# Patient Record
Sex: Male | Born: 1935 | Race: White | Hispanic: No | Marital: Married | Smoking: Former smoker
Health system: Southern US, Community
[De-identification: ages and names within clinical notes are randomized; demographics above are authoritative.]

## PROBLEM LIST (undated history)

## (undated) DIAGNOSIS — R21 Rash and other nonspecific skin eruption: Secondary | ICD-10-CM

## (undated) DIAGNOSIS — J189 Pneumonia, unspecified organism: Secondary | ICD-10-CM

## (undated) DIAGNOSIS — R040 Epistaxis: Secondary | ICD-10-CM

## (undated) DIAGNOSIS — C9200 Acute myeloblastic leukemia, not having achieved remission: Secondary | ICD-10-CM

## (undated) DIAGNOSIS — Z8711 Personal history of peptic ulcer disease: Secondary | ICD-10-CM

## (undated) DIAGNOSIS — I219 Acute myocardial infarction, unspecified: Secondary | ICD-10-CM

## (undated) DIAGNOSIS — I10 Essential (primary) hypertension: Secondary | ICD-10-CM

## (undated) DIAGNOSIS — E119 Type 2 diabetes mellitus without complications: Secondary | ICD-10-CM

## (undated) DIAGNOSIS — C4440 Unspecified malignant neoplasm of skin of scalp and neck: Secondary | ICD-10-CM

## (undated) DIAGNOSIS — R1900 Intra-abdominal and pelvic swelling, mass and lump, unspecified site: Secondary | ICD-10-CM

## (undated) DIAGNOSIS — J449 Chronic obstructive pulmonary disease, unspecified: Secondary | ICD-10-CM

## (undated) DIAGNOSIS — E785 Hyperlipidemia, unspecified: Secondary | ICD-10-CM

## (undated) DIAGNOSIS — M109 Gout, unspecified: Secondary | ICD-10-CM

## (undated) DIAGNOSIS — I509 Heart failure, unspecified: Secondary | ICD-10-CM

## (undated) DIAGNOSIS — I209 Angina pectoris, unspecified: Secondary | ICD-10-CM

## (undated) DIAGNOSIS — Z9289 Personal history of other medical treatment: Secondary | ICD-10-CM

## (undated) DIAGNOSIS — K219 Gastro-esophageal reflux disease without esophagitis: Secondary | ICD-10-CM

## (undated) DIAGNOSIS — C649 Malignant neoplasm of unspecified kidney, except renal pelvis: Secondary | ICD-10-CM

## (undated) DIAGNOSIS — I2510 Atherosclerotic heart disease of native coronary artery without angina pectoris: Secondary | ICD-10-CM

## (undated) DIAGNOSIS — N189 Chronic kidney disease, unspecified: Secondary | ICD-10-CM

## (undated) DIAGNOSIS — N2889 Other specified disorders of kidney and ureter: Secondary | ICD-10-CM

## (undated) DIAGNOSIS — R011 Cardiac murmur, unspecified: Secondary | ICD-10-CM

## (undated) DIAGNOSIS — R012 Other cardiac sounds: Secondary | ICD-10-CM

## (undated) DIAGNOSIS — I519 Heart disease, unspecified: Secondary | ICD-10-CM

## (undated) DIAGNOSIS — E039 Hypothyroidism, unspecified: Secondary | ICD-10-CM

## (undated) DIAGNOSIS — D649 Anemia, unspecified: Secondary | ICD-10-CM

## (undated) DIAGNOSIS — U071 COVID-19: Secondary | ICD-10-CM

## (undated) DIAGNOSIS — R413 Other amnesia: Secondary | ICD-10-CM

## (undated) HISTORY — DX: Other cardiac sounds: R01.2

## (undated) HISTORY — DX: Other amnesia: R41.3

## (undated) HISTORY — PX: LYMPH NODE DISSECTION: SHX5087

## (undated) HISTORY — PX: CATARACT EXTRACTION W/ INTRAOCULAR LENS  IMPLANT, BILATERAL: SHX1307

## (undated) HISTORY — DX: Rash and other nonspecific skin eruption: R21

## (undated) HISTORY — DX: Epistaxis: R04.0

## (undated) HISTORY — DX: Hyperlipidemia, unspecified: E78.5

## (undated) HISTORY — PX: BACK SURGERY: SHX140

## (undated) HISTORY — PX: MOHS SURGERY: SUR867

## (undated) HISTORY — DX: Essential (primary) hypertension: I10

## (undated) HISTORY — DX: Atherosclerotic heart disease of native coronary artery without angina pectoris: I25.10

## (undated) HISTORY — PX: OTHER SURGICAL HISTORY: SHX169

## (undated) HISTORY — DX: Cardiac murmur, unspecified: R01.1

## (undated) HISTORY — PX: NASAL SEPTUM SURGERY: SHX37

## (undated) HISTORY — DX: Heart disease, unspecified: I51.9

## (undated) HISTORY — DX: Intra-abdominal and pelvic swelling, mass and lump, unspecified site: R19.00

## (undated) HISTORY — PX: CARDIAC CATHETERIZATION: SHX172

## (undated) HISTORY — PX: BONE MARROW ASPIRATION: SHX1252

## (undated) HISTORY — DX: Hypothyroidism, unspecified: E03.9

## (undated) HISTORY — PX: BONE MARROW BIOPSY: SHX1253

## (undated) HISTORY — PX: LUMBAR DISC SURGERY: SHX700

## (undated) HISTORY — DX: Other specified disorders of kidney and ureter: N28.89

---
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
## 2001-03-14 ENCOUNTER — Ambulatory Visit (HOSPITAL_COMMUNITY): Admission: RE | Admit: 2001-03-14 | Discharge: 2001-03-14 | Payer: Self-pay | Admitting: Family Medicine

## 2001-03-14 ENCOUNTER — Encounter: Payer: Self-pay | Admitting: Family Medicine

## 2001-09-12 ENCOUNTER — Other Ambulatory Visit: Admission: RE | Admit: 2001-09-12 | Discharge: 2001-09-12 | Payer: Self-pay | Admitting: General Surgery

## 2001-09-21 ENCOUNTER — Other Ambulatory Visit: Admission: RE | Admit: 2001-09-21 | Discharge: 2001-09-21 | Payer: Self-pay | Admitting: Dermatology

## 2001-11-02 ENCOUNTER — Encounter: Payer: Self-pay | Admitting: Family Medicine

## 2001-11-02 ENCOUNTER — Ambulatory Visit (HOSPITAL_COMMUNITY): Admission: RE | Admit: 2001-11-02 | Discharge: 2001-11-02 | Payer: Self-pay | Admitting: Family Medicine

## 2001-12-05 ENCOUNTER — Ambulatory Visit (HOSPITAL_COMMUNITY): Admission: RE | Admit: 2001-12-05 | Discharge: 2001-12-05 | Payer: Self-pay | Admitting: Family Medicine

## 2001-12-05 ENCOUNTER — Encounter: Payer: Self-pay | Admitting: Family Medicine

## 2001-12-27 ENCOUNTER — Ambulatory Visit (HOSPITAL_COMMUNITY): Admission: RE | Admit: 2001-12-27 | Discharge: 2001-12-27 | Payer: Self-pay | Admitting: Family Medicine

## 2002-10-04 ENCOUNTER — Inpatient Hospital Stay (HOSPITAL_COMMUNITY): Admission: EM | Admit: 2002-10-04 | Discharge: 2002-10-05 | Payer: Self-pay | Admitting: Emergency Medicine

## 2002-10-04 ENCOUNTER — Encounter: Payer: Self-pay | Admitting: Emergency Medicine

## 2003-02-19 ENCOUNTER — Encounter (HOSPITAL_COMMUNITY): Admission: RE | Admit: 2003-02-19 | Discharge: 2003-03-21 | Payer: Self-pay | Admitting: Oncology

## 2003-02-19 ENCOUNTER — Encounter: Admission: RE | Admit: 2003-02-19 | Discharge: 2003-02-19 | Payer: Self-pay | Admitting: Oncology

## 2003-03-03 ENCOUNTER — Emergency Department (HOSPITAL_COMMUNITY): Admission: EM | Admit: 2003-03-03 | Discharge: 2003-03-03 | Payer: Self-pay | Admitting: Emergency Medicine

## 2003-04-19 ENCOUNTER — Encounter: Admission: RE | Admit: 2003-04-19 | Discharge: 2003-04-19 | Payer: Self-pay | Admitting: Hematology and Oncology

## 2003-04-19 ENCOUNTER — Encounter (HOSPITAL_COMMUNITY): Admission: RE | Admit: 2003-04-19 | Discharge: 2003-05-19 | Payer: Self-pay | Admitting: Hematology and Oncology

## 2003-05-08 ENCOUNTER — Ambulatory Visit (HOSPITAL_COMMUNITY): Admission: RE | Admit: 2003-05-08 | Discharge: 2003-05-08 | Payer: Self-pay | Admitting: Family Medicine

## 2003-05-08 ENCOUNTER — Encounter: Payer: Self-pay | Admitting: Family Medicine

## 2003-05-31 ENCOUNTER — Encounter: Payer: Self-pay | Admitting: Family Medicine

## 2003-05-31 ENCOUNTER — Ambulatory Visit (HOSPITAL_COMMUNITY): Admission: RE | Admit: 2003-05-31 | Discharge: 2003-05-31 | Payer: Self-pay | Admitting: Family Medicine

## 2003-06-04 ENCOUNTER — Encounter (HOSPITAL_COMMUNITY): Admission: RE | Admit: 2003-06-04 | Discharge: 2003-07-04 | Payer: Self-pay | Admitting: Oncology

## 2003-06-04 ENCOUNTER — Encounter: Admission: RE | Admit: 2003-06-04 | Discharge: 2003-06-04 | Payer: Self-pay | Admitting: Oncology

## 2003-07-12 ENCOUNTER — Encounter: Admission: RE | Admit: 2003-07-12 | Discharge: 2003-07-12 | Payer: Self-pay | Admitting: Oncology

## 2003-07-12 ENCOUNTER — Encounter (HOSPITAL_COMMUNITY): Admission: RE | Admit: 2003-07-12 | Discharge: 2003-08-11 | Payer: Self-pay | Admitting: Oncology

## 2003-10-10 ENCOUNTER — Encounter: Admission: RE | Admit: 2003-10-10 | Discharge: 2003-10-10 | Payer: Self-pay | Admitting: Oncology

## 2003-10-10 ENCOUNTER — Encounter (HOSPITAL_COMMUNITY): Admission: RE | Admit: 2003-10-10 | Discharge: 2003-11-09 | Payer: Self-pay | Admitting: Oncology

## 2004-05-07 ENCOUNTER — Encounter: Admission: RE | Admit: 2004-05-07 | Discharge: 2004-05-07 | Payer: Self-pay | Admitting: Oncology

## 2004-05-07 ENCOUNTER — Encounter (HOSPITAL_COMMUNITY): Admission: RE | Admit: 2004-05-07 | Discharge: 2004-06-06 | Payer: Self-pay | Admitting: Oncology

## 2004-08-31 ENCOUNTER — Ambulatory Visit (HOSPITAL_COMMUNITY): Admission: RE | Admit: 2004-08-31 | Discharge: 2004-08-31 | Payer: Self-pay | Admitting: Pediatrics

## 2004-09-28 ENCOUNTER — Ambulatory Visit (HOSPITAL_COMMUNITY): Admission: RE | Admit: 2004-09-28 | Discharge: 2004-09-28 | Payer: Self-pay | Admitting: Family Medicine

## 2005-01-07 ENCOUNTER — Ambulatory Visit: Payer: Self-pay | Admitting: Cardiology

## 2005-01-07 ENCOUNTER — Ambulatory Visit (HOSPITAL_COMMUNITY): Payer: Self-pay | Admitting: Oncology

## 2005-01-07 ENCOUNTER — Encounter: Admission: RE | Admit: 2005-01-07 | Discharge: 2005-01-07 | Payer: Self-pay | Admitting: Oncology

## 2005-01-07 ENCOUNTER — Encounter (HOSPITAL_COMMUNITY): Admission: RE | Admit: 2005-01-07 | Discharge: 2005-02-06 | Payer: Self-pay | Admitting: Oncology

## 2005-01-19 ENCOUNTER — Ambulatory Visit: Payer: Self-pay

## 2005-01-21 ENCOUNTER — Ambulatory Visit: Payer: Self-pay | Admitting: Cardiology

## 2005-04-10 ENCOUNTER — Ambulatory Visit (HOSPITAL_COMMUNITY): Admission: RE | Admit: 2005-04-10 | Discharge: 2005-04-10 | Payer: Self-pay | Admitting: Family Medicine

## 2005-11-16 ENCOUNTER — Ambulatory Visit (HOSPITAL_COMMUNITY): Admission: RE | Admit: 2005-11-16 | Discharge: 2005-11-16 | Payer: Self-pay | Admitting: Family Medicine

## 2005-11-25 ENCOUNTER — Ambulatory Visit (HOSPITAL_COMMUNITY): Admission: RE | Admit: 2005-11-25 | Discharge: 2005-11-25 | Payer: Self-pay | Admitting: Family Medicine

## 2006-01-06 ENCOUNTER — Encounter: Admission: RE | Admit: 2006-01-06 | Discharge: 2006-01-06 | Payer: Self-pay | Admitting: Oncology

## 2006-01-06 ENCOUNTER — Encounter (HOSPITAL_COMMUNITY): Admission: RE | Admit: 2006-01-06 | Discharge: 2006-02-05 | Payer: Self-pay | Admitting: Oncology

## 2006-01-06 ENCOUNTER — Ambulatory Visit (HOSPITAL_COMMUNITY): Payer: Self-pay | Admitting: Oncology

## 2006-02-07 ENCOUNTER — Ambulatory Visit: Payer: Self-pay | Admitting: Cardiology

## 2006-07-08 ENCOUNTER — Encounter (HOSPITAL_COMMUNITY): Admission: RE | Admit: 2006-07-08 | Discharge: 2006-07-09 | Payer: Self-pay | Admitting: Oncology

## 2006-07-08 ENCOUNTER — Ambulatory Visit (HOSPITAL_COMMUNITY): Payer: Self-pay | Admitting: Oncology

## 2006-07-08 ENCOUNTER — Encounter: Admission: RE | Admit: 2006-07-08 | Discharge: 2006-07-09 | Payer: Self-pay | Admitting: Oncology

## 2006-07-12 ENCOUNTER — Encounter: Admission: RE | Admit: 2006-07-12 | Discharge: 2006-07-12 | Payer: Self-pay | Admitting: Oncology

## 2006-10-11 DIAGNOSIS — N2889 Other specified disorders of kidney and ureter: Secondary | ICD-10-CM

## 2006-10-11 DIAGNOSIS — R1900 Intra-abdominal and pelvic swelling, mass and lump, unspecified site: Secondary | ICD-10-CM

## 2006-10-11 HISTORY — DX: Other specified disorders of kidney and ureter: N28.89

## 2006-10-11 HISTORY — DX: Intra-abdominal and pelvic swelling, mass and lump, unspecified site: R19.00

## 2007-03-03 ENCOUNTER — Ambulatory Visit (HOSPITAL_COMMUNITY): Admission: RE | Admit: 2007-03-03 | Discharge: 2007-03-03 | Payer: Self-pay | Admitting: Family Medicine

## 2007-03-09 ENCOUNTER — Encounter (HOSPITAL_COMMUNITY): Admission: RE | Admit: 2007-03-09 | Discharge: 2007-04-08 | Payer: Self-pay | Admitting: Oncology

## 2007-03-09 ENCOUNTER — Ambulatory Visit (HOSPITAL_COMMUNITY): Payer: Self-pay | Admitting: Oncology

## 2007-03-17 ENCOUNTER — Ambulatory Visit (HOSPITAL_COMMUNITY): Admission: RE | Admit: 2007-03-17 | Discharge: 2007-03-17 | Payer: Self-pay | Admitting: Oncology

## 2007-03-30 ENCOUNTER — Encounter: Admission: RE | Admit: 2007-03-30 | Discharge: 2007-03-30 | Payer: Self-pay | Admitting: Surgery

## 2007-04-06 ENCOUNTER — Ambulatory Visit: Payer: Self-pay | Admitting: Cardiology

## 2007-04-12 ENCOUNTER — Encounter (INDEPENDENT_AMBULATORY_CARE_PROVIDER_SITE_OTHER): Payer: Self-pay | Admitting: Surgery

## 2007-04-12 ENCOUNTER — Ambulatory Visit (HOSPITAL_COMMUNITY): Admission: RE | Admit: 2007-04-12 | Discharge: 2007-04-13 | Payer: Self-pay | Admitting: Surgery

## 2007-08-10 ENCOUNTER — Ambulatory Visit (HOSPITAL_COMMUNITY): Admission: RE | Admit: 2007-08-10 | Discharge: 2007-08-10 | Payer: Self-pay | Admitting: Family Medicine

## 2007-11-28 ENCOUNTER — Encounter (HOSPITAL_COMMUNITY): Admission: RE | Admit: 2007-11-28 | Discharge: 2007-12-28 | Payer: Self-pay | Admitting: Oncology

## 2007-11-28 ENCOUNTER — Ambulatory Visit (HOSPITAL_COMMUNITY): Payer: Self-pay | Admitting: Oncology

## 2007-12-04 ENCOUNTER — Ambulatory Visit (HOSPITAL_COMMUNITY): Admission: RE | Admit: 2007-12-04 | Discharge: 2007-12-04 | Payer: Self-pay | Admitting: Oncology

## 2007-12-13 ENCOUNTER — Encounter (HOSPITAL_COMMUNITY): Payer: Self-pay | Admitting: Oncology

## 2008-01-29 ENCOUNTER — Encounter (INDEPENDENT_AMBULATORY_CARE_PROVIDER_SITE_OTHER): Payer: Self-pay | Admitting: Surgery

## 2008-01-29 ENCOUNTER — Inpatient Hospital Stay (HOSPITAL_COMMUNITY): Admission: RE | Admit: 2008-01-29 | Discharge: 2008-02-03 | Payer: Self-pay | Admitting: Surgery

## 2008-02-16 ENCOUNTER — Ambulatory Visit (HOSPITAL_COMMUNITY): Payer: Self-pay | Admitting: Oncology

## 2008-06-07 ENCOUNTER — Ambulatory Visit: Payer: Self-pay | Admitting: Cardiology

## 2008-11-19 ENCOUNTER — Ambulatory Visit (HOSPITAL_COMMUNITY): Admission: RE | Admit: 2008-11-19 | Discharge: 2008-11-19 | Payer: Self-pay | Admitting: Urology

## 2008-12-18 ENCOUNTER — Ambulatory Visit (HOSPITAL_COMMUNITY): Admission: RE | Admit: 2008-12-18 | Discharge: 2008-12-18 | Payer: Self-pay | Admitting: Family Medicine

## 2009-02-14 ENCOUNTER — Encounter (HOSPITAL_COMMUNITY): Admission: RE | Admit: 2009-02-14 | Discharge: 2009-03-16 | Payer: Self-pay | Admitting: Oncology

## 2009-02-14 ENCOUNTER — Ambulatory Visit (HOSPITAL_COMMUNITY): Payer: Self-pay | Admitting: Oncology

## 2009-04-11 ENCOUNTER — Ambulatory Visit (HOSPITAL_COMMUNITY): Payer: Self-pay | Admitting: Oncology

## 2009-05-20 ENCOUNTER — Ambulatory Visit (HOSPITAL_COMMUNITY): Admission: RE | Admit: 2009-05-20 | Discharge: 2009-05-20 | Payer: Self-pay | Admitting: Urology

## 2009-06-29 ENCOUNTER — Encounter: Payer: Self-pay | Admitting: Cardiology

## 2009-06-29 DIAGNOSIS — E785 Hyperlipidemia, unspecified: Secondary | ICD-10-CM | POA: Insufficient documentation

## 2009-06-29 DIAGNOSIS — I10 Essential (primary) hypertension: Secondary | ICD-10-CM | POA: Insufficient documentation

## 2009-06-29 DIAGNOSIS — R079 Chest pain, unspecified: Secondary | ICD-10-CM | POA: Insufficient documentation

## 2009-06-29 DIAGNOSIS — E039 Hypothyroidism, unspecified: Secondary | ICD-10-CM

## 2009-06-30 ENCOUNTER — Ambulatory Visit: Payer: Self-pay | Admitting: Cardiology

## 2009-09-18 ENCOUNTER — Telehealth (INDEPENDENT_AMBULATORY_CARE_PROVIDER_SITE_OTHER): Payer: Self-pay | Admitting: *Deleted

## 2009-09-21 ENCOUNTER — Encounter: Payer: Self-pay | Admitting: Cardiology

## 2009-09-22 ENCOUNTER — Ambulatory Visit: Payer: Self-pay | Admitting: Cardiology

## 2009-09-22 DIAGNOSIS — R040 Epistaxis: Secondary | ICD-10-CM

## 2009-09-22 LAB — CONVERTED CEMR LAB
BUN: 19 mg/dL (ref 6–23)
CO2: 30 meq/L (ref 19–32)
Chloride: 105 meq/L (ref 96–112)
Creatinine, Ser: 1.5 mg/dL (ref 0.4–1.5)
Glucose, Bld: 81 mg/dL (ref 70–99)
HCT: 41.1 % (ref 39.0–52.0)
Hemoglobin: 13.9 g/dL (ref 13.0–17.0)
Prothrombin Time: 12 s — ABNORMAL HIGH (ref 9.1–11.7)
RBC: 4.74 M/uL (ref 4.22–5.81)
WBC: 4.8 10*3/uL (ref 4.0–10.5)

## 2009-09-23 ENCOUNTER — Telehealth: Payer: Self-pay | Admitting: Cardiology

## 2009-09-23 ENCOUNTER — Ambulatory Visit: Payer: Self-pay | Admitting: Cardiology

## 2009-09-23 ENCOUNTER — Inpatient Hospital Stay (HOSPITAL_BASED_OUTPATIENT_CLINIC_OR_DEPARTMENT_OTHER): Admission: RE | Admit: 2009-09-23 | Discharge: 2009-09-23 | Payer: Self-pay | Admitting: Cardiology

## 2009-09-23 ENCOUNTER — Encounter: Payer: Self-pay | Admitting: Cardiology

## 2009-09-25 ENCOUNTER — Encounter: Payer: Self-pay | Admitting: Cardiology

## 2009-09-25 DIAGNOSIS — I2510 Atherosclerotic heart disease of native coronary artery without angina pectoris: Secondary | ICD-10-CM | POA: Insufficient documentation

## 2009-09-25 DIAGNOSIS — I2583 Coronary atherosclerosis due to lipid rich plaque: Secondary | ICD-10-CM

## 2009-09-26 ENCOUNTER — Ambulatory Visit: Payer: Self-pay | Admitting: Cardiology

## 2009-10-14 ENCOUNTER — Ambulatory Visit (HOSPITAL_COMMUNITY): Payer: Self-pay | Admitting: Oncology

## 2009-10-14 ENCOUNTER — Encounter (HOSPITAL_COMMUNITY): Admission: RE | Admit: 2009-10-14 | Discharge: 2009-11-13 | Payer: Self-pay | Admitting: Oncology

## 2009-10-22 ENCOUNTER — Encounter: Payer: Self-pay | Admitting: Cardiology

## 2009-10-23 ENCOUNTER — Telehealth: Payer: Self-pay | Admitting: Cardiology

## 2009-10-28 ENCOUNTER — Ambulatory Visit: Payer: Self-pay | Admitting: Cardiology

## 2009-10-28 DIAGNOSIS — R519 Headache, unspecified: Secondary | ICD-10-CM | POA: Insufficient documentation

## 2009-10-28 DIAGNOSIS — R51 Headache: Secondary | ICD-10-CM | POA: Insufficient documentation

## 2009-11-14 ENCOUNTER — Encounter: Payer: Self-pay | Admitting: Cardiology

## 2010-01-21 ENCOUNTER — Ambulatory Visit: Payer: Self-pay | Admitting: Cardiology

## 2010-04-22 ENCOUNTER — Ambulatory Visit (HOSPITAL_COMMUNITY): Payer: Self-pay | Admitting: Oncology

## 2010-04-22 ENCOUNTER — Encounter (HOSPITAL_COMMUNITY): Admission: RE | Admit: 2010-04-22 | Discharge: 2010-05-22 | Payer: Self-pay | Admitting: Oncology

## 2010-04-29 ENCOUNTER — Encounter: Payer: Self-pay | Admitting: Cardiology

## 2010-06-11 DIAGNOSIS — R21 Rash and other nonspecific skin eruption: Secondary | ICD-10-CM

## 2010-06-11 HISTORY — DX: Rash and other nonspecific skin eruption: R21

## 2010-06-30 ENCOUNTER — Ambulatory Visit (HOSPITAL_COMMUNITY)
Admission: RE | Admit: 2010-06-30 | Discharge: 2010-06-30 | Payer: Self-pay | Source: Home / Self Care | Admitting: Family Medicine

## 2010-07-04 ENCOUNTER — Inpatient Hospital Stay (HOSPITAL_COMMUNITY): Admission: EM | Admit: 2010-07-04 | Discharge: 2010-07-07 | Payer: Self-pay | Admitting: Emergency Medicine

## 2010-07-04 ENCOUNTER — Ambulatory Visit: Payer: Self-pay | Admitting: Internal Medicine

## 2010-07-06 ENCOUNTER — Encounter (INDEPENDENT_AMBULATORY_CARE_PROVIDER_SITE_OTHER): Payer: Self-pay | Admitting: Internal Medicine

## 2010-07-09 ENCOUNTER — Telehealth: Payer: Self-pay | Admitting: Cardiology

## 2010-07-21 ENCOUNTER — Encounter: Payer: Self-pay | Admitting: Cardiology

## 2010-07-22 ENCOUNTER — Ambulatory Visit: Payer: Self-pay | Admitting: Cardiology

## 2010-07-23 ENCOUNTER — Encounter: Payer: Self-pay | Admitting: Cardiology

## 2010-08-07 ENCOUNTER — Encounter: Admission: RE | Admit: 2010-08-07 | Discharge: 2010-08-07 | Payer: Self-pay | Admitting: Neurosurgery

## 2010-08-13 ENCOUNTER — Encounter: Admission: RE | Admit: 2010-08-13 | Discharge: 2010-08-13 | Payer: Self-pay | Admitting: Neurosurgery

## 2010-08-20 ENCOUNTER — Ambulatory Visit: Payer: Self-pay | Admitting: Cardiology

## 2010-09-14 ENCOUNTER — Encounter: Admission: RE | Admit: 2010-09-14 | Discharge: 2010-09-14 | Payer: Self-pay | Admitting: Neurosurgery

## 2010-10-20 ENCOUNTER — Encounter: Payer: Self-pay | Admitting: Cardiology

## 2010-10-21 ENCOUNTER — Encounter (HOSPITAL_COMMUNITY)
Admission: RE | Admit: 2010-10-21 | Discharge: 2010-11-10 | Payer: Self-pay | Source: Home / Self Care | Attending: Oncology | Admitting: Oncology

## 2010-10-28 ENCOUNTER — Ambulatory Visit (HOSPITAL_COMMUNITY)
Admission: RE | Admit: 2010-10-28 | Discharge: 2010-11-10 | Payer: Self-pay | Source: Home / Self Care | Attending: Oncology | Admitting: Oncology

## 2010-11-01 ENCOUNTER — Encounter: Payer: Self-pay | Admitting: Urology

## 2010-11-12 NOTE — Letter (Signed)
Summary: [facility] Progress Note   [facility] Progress Note   Imported By: Roderic Ovens 06/22/2010 12:38:16  _____________________________________________________________________  External Attachment:    Type:   Image     Comment:   External Document

## 2010-11-12 NOTE — Assessment & Plan Note (Signed)
Summary: per check out/sf   Visit Type:  Follow-up Primary Provider:  Karleen Hampshire, MD  CC:  CAD.  History of Present Illness: The patient is here for followup of hypertension and coronary artery disease.  I saw him last August 20, 2010.  That visit was a post hospital visit for his admission with chest pain and hypertension.  He underwent cardiac catheterization showing his old tight right coronary lesion.  There was no significant change.  He cannot have an intervention because of recurrent nosebleeds.  His nosebleeds due to continued but they're limited.  Is not on aspirin or Plavix.  He's not had any significant chest pain.  Unfortunately he is having some low back pain.  Current Medications (verified): 1)  Tricor 145 Mg Tabs (Fenofibrate) .... Take 1 Tablet By Mouth Once A Day 2)  Levothyroxine Sodium 125 Mcg Tabs (Levothyroxine Sodium) .... Take 1 Tablet By Mouth Once A Day 3)  Norvasc 10 Mg Tabs (Amlodipine Besylate) .Marland Kitchen.. 1 By Mouth Daily 4)  Multivitamins  Tabs (Multiple Vitamin) .... Take 1 Tablet By Mouth Once A Day 5)  Vitamin B-12 1000 Mcg Tabs (Cyanocobalamin) .... Take 1 Tablet By Mouth Once A Day 6)  Fish Oil 1000 Mg Caps (Omega-3 Fatty Acids) .... Take 1 Capsule By Mouth Once A Day 7)  Losartan Potassium 100 Mg Tabs (Losartan Potassium) .... Take 1 Tablet By Mouth Once A Day 8)  Potassium Chloride Cr 10 Meq Cr-Caps (Potassium Chloride) .... Take One Tablet By Mouth Daily 9)  Singulair 10 Mg Tabs (Montelukast Sodium) .... As Needed 10)  Nitrostat 0.4 Mg Subl (Nitroglycerin) .Marland Kitchen.. 1 By Mouth As Needed 11)  Metoprolol Succinate 25 Mg Xr24h-Tab (Metoprolol Succinate) .... Take 1/2 Tablet By Mouth Daily 12)  Combivent 103-18 Mcg/act Aero (Ipratropium-Albuterol) .... As Needed 13)  Isosorbide Mononitrate Cr 60 Mg Xr24h-Tab (Isosorbide Mononitrate) .... Take One Tablet By Mouth Daily 14)  Flomax 0.4 Mg Caps (Tamsulosin Hcl) .... At Bedtime 15)  Flonase 50 Mcg/act Susp  (Fluticasone Propionate) .... At Bedtime 16)  Simvastatin 40 Mg Tabs (Simvastatin) .... Take One Tablet By Mouth Daily At Bedtime 17)  Hydrocodone-Acetaminophen 10-325 Mg Tabs (Hydrocodone-Acetaminophen) .... As Needed  Allergies (verified): No Known Drug Allergies  Past History:  Past Medical History: HYPERTENSION  .Marland Kitchenno RAS..cath 2010 Rash on the buttocks... post hospitalization September, 2011... hydralazine stopped but then restarted without return of rash Dyslipidemia Hypothyroidism Chest pain... Myoview 2006 no ischemia  /  no ischemia...nuclear  05/2008 /  CAD CAD..cath 09/23/2009.Marland KitchenMarland Kitchen70% Dx1(diffuse disease)..40% Cx, Large RCA  80-90% with heavy calcification.. /  06/2010... hospital with elevated BP and CPain...cath...no change...tight RCA still best to treat medically with nosebleeds.. LV function.. 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 Mariel Sleet Imdur intolerance.... severe headaches.... January, 2011.  Memory decreasing.Marland KitchenMarland Kitchen9/2011 EF  60%...cath...06/2010  Review of Systems       Patient denies fever, chills, headache, sweats, rash, change in vision, change in hearing, chest pain, cough, nausea vomiting, urinary symptoms.  All other systems are reviewed and are negative.   Vital Signs:  Patient profile:   77 year old male Height:      74 inches Weight:      211 pounds BMI:  27.19 Pulse rate:   49 / minute BP sitting:   132 / 64  (left arm) Cuff size:   regular  Vitals Entered By: Hardin Negus, RMA (August 20, 2010 10:29 AM)  Physical Exam  General:  patient looks good day. Eyes:  no xanthelasma. Neck:  no jugular venous distention. Lungs:  lungs are clear.  Respiratory effort is  nonlabored. Heart:  cardiac exam reveals an S1-S2.  No clicks or significant murmurs. Abdomen:  abdomen is soft. Extremities:  no peripheral edema. Psych:  patient is oriented to person time and place.  Affect is normal.   Impression & Recommendations:  Problem # 1:  HEADACHE (ICD-784.0)  His updated medication list for this problem includes:    Metoprolol Succinate 25 Mg Xr24h-tab (Metoprolol succinate) .Marland Kitchen... Take 1/2 tablet by mouth daily    Hydrocodone-acetaminophen 10-325 Mg Tabs (Hydrocodone-acetaminophen) .Marland Kitchen... As needed Is not having any recurrent headaches.  Problem # 2:  CAD (ICD-414.00)  His updated medication list for this problem includes:    Norvasc 10 Mg Tabs (Amlodipine besylate) .Marland Kitchen... 1 by mouth daily    Nitrostat 0.4 Mg Subl (Nitroglycerin) .Marland Kitchen... 1 by mouth as needed    Metoprolol Succinate 25 Mg Xr24h-tab (Metoprolol succinate) .Marland Kitchen... Take 1/2 tablet by mouth daily    Isosorbide Mononitrate Cr 60 Mg Xr24h-tab (Isosorbide mononitrate) .Marland Kitchen... Take one tablet by mouth daily  Orders: EKG w/ Interpretation (93000) Coronary disease is stable.  EKG is done today and reviewed by me.  He does have bradycardia.  There no changes.  Problem # 3:  NOSEBLEED (ICD-784.7) Unfortunately he continues to have limited nosebleeds.  Because of this we cannot use aspirin or Plavix and he cannot have any coronary interventions.  Problem # 4:  HYPERTENSION (ICD-401.9)  His updated medication list for this problem includes:    Norvasc 10 Mg Tabs (Amlodipine besylate) .Marland Kitchen... 1 by mouth daily    Losartan Potassium 100 Mg Tabs (Losartan potassium) .Marland Kitchen... Take 1 tablet by mouth once a day    Metoprolol Succinate 25 Mg Xr24h-tab (Metoprolol succinate) .Marland Kitchen... Take 1/2 tablet by mouth daily The patient brings his record of home blood pressures.  He is under good control.  His pressure has been as low as 95 systolic and as high as 155 systolic.  In general the numbers are well controlled and his  pulse rate is around 60.  No change in therapy.  When he becomes anxious he has high blood pressure.  I will see him back in 3 months for cardiology followup.  Patient Instructions: 1)  Your physician recommends that you schedule a follow-up appointment in: 3 months. 2)  Your physician recommends that you continue on your current medications as directed. Please refer to the Current Medication list given to you today.

## 2010-11-12 NOTE — Letter (Signed)
Summary: [facility] Progress Note  [facility] Progress Note   Imported By: Roderic Ovens 11/21/2009 15:52:40  _____________________________________________________________________  External Attachment:    Type:   Image     Comment:   External Document

## 2010-11-12 NOTE — Progress Notes (Signed)
Summary: whelps from med  Phone Note Call from Patient   Caller: Patient [phone] Reason for Call: Talk to Nurse Summary of Call: dr Myrtis Ser gave pt med yesterday while at [facility]-now has whelps on his rear end -pls call Initial call taken by: Glynda Jaeger,  July 09, 2010 4:58 PM  Follow-up for Phone Call        PT  STATED NEW MED WAS STARTED  AT LAST HOSPITAL STAY  HYDRALAZINE  25 MG TID NOTES WELPS TO BUTTOCKS  VERY ITCHY  INSTRUCTED PT TO HOLD HYDRALAZINE AND TAKE BENADRYL as needed MAY APPLY OTC CREAM TO AFFECTED AREA FOR ITCHING  OR TRY OATMEAL BATH  WILL FORWARD TO DR Myrtis Ser FOR REVIEW ALSO PT GAVE B/P READINGS OVER LAST 2 DAYS  VARIES FROM 94-110/52-61 AND HR 66-88. PT INFORMED TO CONTINUE TO MONITOR B/P AND HR PLEASE ADVISE. Follow-up by: Scherrie Bateman, LPN,  July 09, 2010 5:34 PM  Additional Follow-up for Phone Call Additional follow up Details #1::        Please call to check on him today.  Myrtis Ser  have attempted to call pt 2 different times this am w/a busy signal Meredith Staggers, RN  July 10, 2010 11:40 AM   wife gave me pts cell [phone], called and spoke w/pt he states welps are much better, they are still present but much better, will let Dr Myrtis Ser know Meredith Staggers, RN  July 13, 2010 6:21 PM     Additional Follow-up for Phone Call Additional follow up Details #2::    I will plan to see him in the office at that time a followup visit.  Talitha Givens, MD, [facility]  July 15, 2010 5:16 PM

## 2010-11-12 NOTE — Assessment & Plan Note (Signed)
Summary: f63m   Visit Type:  post hospital visit Primary Provider:  Patrica Duel, MD  CC:  CAD.  History of Present Illness: The patient is seen post hospitalization.  He had presented with some chest pain and hypertension.  His hypertension improved.  We know that he did not have renal artery stenosis by catheterization in 2010.  Because of his chest pain we decided to do repeat catheterization to see if there had been a major change in other areas of his heart.  I knew that he had a tight right coronary lesion.  The study showed that there was no significant change.  There is significant disease in his right coronary artery.  It was felt most prudent not to intervene because of his significant nosebleed problems and the inability to use aspirin or Plavix.  He was discharged home and has done well.  He had a rash on his buttocks.  There was question that it was related to hydralazine added for his blood pressure.  Hydralazine was stopped and he took Benadryl.  The rash disappeared and then he restarted the hydralazine and he is currently on.  He mentions that at times in the morning his blood pressure is below 100 systolic and he feels weak.  Later in the day he feels better.  As part of my evaluation today, I carefully reviewed all of the hospital records including the catheter report and discharge summary and other labs.  Current Medications (verified): 1)  Tricor 145 Mg Tabs (Fenofibrate) .... Take 1 Tablet By Mouth Once A Day 2)  Levothyroxine Sodium 125 Mcg Tabs (Levothyroxine Sodium) .... Take 1 Tablet By Mouth Once A Day 3)  Norvasc 10 Mg Tabs (Amlodipine Besylate) .Marland Kitchen.. 1 By Mouth Daily 4)  Multivitamins  Tabs (Multiple Vitamin) .... Take 1 Tablet By Mouth Once A Day 5)  Vitamin B-12 1000 Mcg Tabs (Cyanocobalamin) .... Take 1 Tablet By Mouth Once A Day 6)  Fish Oil 1000 Mg Caps (Omega-3 Fatty Acids) .... Take 1 Capsule By Mouth Once A Day 7)  Losartan Potassium 100 Mg Tabs (Losartan  Potassium) .... Take 1 Tablet By Mouth Once A Day 8)  Potassium Chloride Cr 10 Meq Cr-Caps (Potassium Chloride) .... Take One Tablet By Mouth Daily 9)  Singulair 10 Mg Tabs (Montelukast Sodium) .... As Needed 10)  Nitrostat 0.4 Mg Subl (Nitroglycerin) .Marland Kitchen.. 1 By Mouth As Needed 11)  Metoprolol Succinate 25 Mg Xr24h-Tab (Metoprolol Succinate) .... Take 1/2 Tablet By Mouth Daily 12)  Combivent 103-18 Mcg/act Aero (Ipratropium-Albuterol) .... As Needed 13)  Isosorbide Mononitrate Cr 60 Mg Xr24h-Tab (Isosorbide Mononitrate) .... Take One Tablet By Mouth Daily 14)  Flomax 0.4 Mg Caps (Tamsulosin Hcl) .... At Bedtime 15)  Flonase 50 Mcg/act Susp (Fluticasone Propionate) .... At Bedtime 16)  Hydralazine Hcl 25 Mg Tabs (Hydralazine Hcl) .... Take One Tablet By Mouth Three Times A Day 17)  Simvastatin 40 Mg Tabs (Simvastatin) .... Take One Tablet By Mouth Daily At Bedtime  Allergies (verified): No Known Drug Allergies  Past History:  Past Medical History: HYPERTENSION  .Marland Kitchenno RAS..cath 2010 Rash on the buttocks... post hospitalization September, 2011... hydralazine stopped but then restarted without return of rash Dyslipidemia Hypothyroidism Chest pain... Myoview 2006 no ischemia  /  no ischemia...nuclear  05/2008 /  CAD CAD..cath 09/23/2009.Marland KitchenMarland Kitchen70% Dx1(diffuse disease)..40% Cx, Large RCA  80-90% with heavy calcification.. /  06/2010... hospital with elevated BP and CPain...cath...no change...tight RCA still best to treat medically with nosebleeds.. LV function.. 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 Mariel Sleet Imdur intolerance.... severe headaches.... January, 2011.  Memory decreasing.Marland KitchenMarland Kitchen9/2011 EF  60%...cath...06/2010  Review of  Systems       Patient denies fever, chills, headache, sweats, change in vision, change in hearing, chest pain, cough, nausea vomiting, urinary symptoms.  He does have slight discomfort in his right hip today.  All of the systems are reviewed and are negative  Vital Signs:  Patient profile:   77 year old male Height:      74 inches Weight:      212 pounds BMI:     27.32 Pulse rate:   65 / minute BP sitting:   136 / 50  (left arm) Cuff size:   regular  Vitals Entered By: Hardin Negus, RMA (July 22, 2010 2:01 PM)  Physical Exam  General:  patient is stable in general. Head:  head is atraumatic. Eyes:  no xanthelasma. Neck:  no jugular venous distention. Chest Wall:  no chest wall tenderness. Lungs:  lungs are clear.  Respiratory effort is nonlabored. Heart:  cardiac exam reveals S1-S2.  No clicks or significant murmurs. Abdomen:  abdomen is soft. Msk:  no musculoskeletal deformities. Extremities:  no peripheral edema. Skin:  no skin rash currently. Psych:  patient is oriented to person time and place.  Affect is normal.   Impression & Recommendations:  Problem # 1:  * MEMORY DECREASING...06/2010 We will have to keep this issue in mind carefully. It will be important if we were to ever consider bypass surgery.  This is not a consideration at this time.  Problem # 2:  * RASH The patient had a rash on his buttocks posthospitalization.  There was question that it might be from hydralazine and this was held and he took Benadryl.  On his own he restarted hydralazine and has had no return of rash.  Etiology of the rash may well have been local  Problem # 3:  HEADACHE (ICD-784.0)  His updated medication list for this problem includes:    Metoprolol Succinate 25 Mg Xr24h-tab (Metoprolol succinate) .Marland Kitchen... Take 1/2 tablet by mouth daily He has not had any return of significant headaches.  Problem # 4:  CAD (ICD-414.00)  His updated medication list for this problem includes:     Norvasc 10 Mg Tabs (Amlodipine besylate) .Marland Kitchen... 1 by mouth daily    Nitrostat 0.4 Mg Subl (Nitroglycerin) .Marland Kitchen... 1 by mouth as needed    Metoprolol Succinate 25 Mg Xr24h-tab (Metoprolol succinate) .Marland Kitchen... Take 1/2 tablet by mouth daily    Isosorbide Mononitrate Cr 60 Mg Xr24h-tab (Isosorbide mononitrate) .Marland Kitchen... Take one tablet by mouth daily Coronary disease is stable.  Catheterization was done September, 2011.  He has a tight right coronary lesion that is best treated medically considering all of his issues.  He may have pain from time to time.  If so we will titrate his meds further.  Problem # 5:  NOSEBLEED (ICD-784.7) Fortunately his nosebleeds have been stable recently.  No change in therapy.  No aspirin or Plavix  Problem # 6:  HYPOTHYROIDISM (ICD-244.9)  His updated medication list for this problem includes:    Levothyroxine Sodium 125 Mcg Tabs (Levothyroxine sodium) .Marland Kitchen... Take 1 tablet by mouth once a  day Thyroid is treated.  No change in therapy.  Problem # 7:  HYPERTENSION (ICD-401.9)  The following medications were removed from the medication list:    Hydralazine Hcl 25 Mg Tabs (Hydralazine hcl) .Marland Kitchen... Take one tablet by mouth three times a day His updated medication list for this problem includes:    Norvasc 10 Mg Tabs (Amlodipine besylate) .Marland Kitchen... 1 by mouth daily    Losartan Potassium 100 Mg Tabs (Losartan potassium) .Marland Kitchen... Take 1 tablet by mouth once a day    Metoprolol Succinate 25 Mg Xr24h-tab (Metoprolol succinate) .Marland Kitchen... Take 1/2 tablet by mouth daily The patient's home blood pressure cuff is checked today. the home blood pressure cuff works adequately.  The systolic pressure may be slightly higher than our cuff here.  This suggests that the low blood pressures if the patient has obtained at home are real.  With this His hydralazine will be stopped.  It is possible that he had only very isolated hypertension around the time of his chest pain when he came into the hospital.  I will  then see him for follow up.  Patient Instructions: 1)  Stop Hydralazine 2)  Follow up in 3-4 weeks

## 2010-11-12 NOTE — Miscellaneous (Signed)
  Clinical Lists Changes  Problems: Added new problem of * HYDRALAZINE RASH. Added new problem of * MEMORY DECREASING...06/2010 Observations: Added new observation of PAST MED HX: HYPERTENSION  .Marland Kitchenno RAS..cath 2010 Hydralazine used 06/2010....rash...stopped Dyslipidemia Hypothyroidism Chest pain... Myoview 2006 no ischemia  /  no ischemia...nuclear  05/2008 /  CAD CAD..cath 09/23/2009.Marland KitchenMarland Kitchen70% Dx1(diffuse disease)..40% Cx, Large RCA  80-90% with heavy calcification.. /  06/2010... hospital with elevated BP and CPain...cath...no change...tight RCA still best to treat medically with nosebleeds.. LV function.. 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 Mariel Sleet Imdur intolerance.... severe headaches.... January, 2011.  Memory decreasing.Marland KitchenMarland Kitchen9/2011 EF  60%...cath...06/2010 (07/21/2010 10:34) Added new observation of PRIMARY MD: Patrica Duel, MD (07/21/2010 10:34)       Past History:  Past Medical History: HYPERTENSION  .Marland Kitchenno RAS..cath 2010 Hydralazine used 06/2010....rash...stopped Dyslipidemia Hypothyroidism Chest pain... Myoview 2006 no ischemia  /  no ischemia...nuclear  05/2008 /  CAD CAD..cath 09/23/2009.Marland KitchenMarland Kitchen70% Dx1(diffuse disease)..40% Cx, Large RCA  80-90% with heavy calcification.. /  06/2010... hospital with elevated BP and CPain...cath...no change...tight RCA still best to treat medically with nosebleeds.. LV function.. normal nuclear scan 2006  /  normal...nuclear  2009 /  EF 60%  cath...09/2009 Systolic click but no mitral valve prolapse Nosebleeds...significant...can not take ASA Kidney mass    2008    radiofrequency ablation [facility] 2008 Abdominal mass 2008  chronic inflammatory mass of the  mesentary...Dr. Mariel Sleet Imdur intolerance.... severe headaches.... January, 2011.  Memory decreasing.Marland KitchenMarland Kitchen9/2011 EF  60%...cath...06/2010

## 2010-11-12 NOTE — Assessment & Plan Note (Signed)
Summary: per check out/sf   Visit Type:  Follow-up Primary Provider:  Patrica Duel, MD  CC:  CAD.  History of Present Illness: The patient is seen for followup of coronary disease.  She is also seen for followup of hypertension.  I saw him last October 28 2009.  He had a nosebleed the day after that.  His blood pressure that time was 150/75.  He has not had a nosebleed since.  He is wearing a blood pressure cuff that appears to work well.  With the medicine adjustments his pressure appears to be running in the range of 130/60.  He is playing golf regularly.  He wants to be more active.  With his walk he can have some chest tightness if he pushes too hard walking up a hill in his walk.  I've encouraged him not to push any heart or in this area.  Current Medications (verified): 1)  Tricor 145 Mg Tabs (Fenofibrate) .... Take 1 Tablet By Mouth Once A Day 2)  Levothyroxine Sodium 125 Mcg Tabs (Levothyroxine Sodium) .... Take 1 Tablet By Mouth Once A Day 3)  Norvasc 10 Mg Tabs (Amlodipine Besylate) .Marland Kitchen.. 1 By Mouth Daily 4)  Multivitamins  Tabs (Multiple Vitamin) .... Take 1 Tablet By Mouth Once A Day 5)  Vitamin B-12 1000 Mcg Tabs (Cyanocobalamin) .... Take 1 Tablet By Mouth Once A Day 6)  Fish Oil 1000 Mg Caps (Omega-3 Fatty Acids) .... Take 1 Capsule By Mouth Once A Day 7)  Losartan Potassium 100 Mg Tabs (Losartan Potassium) .... Take 1 Tablet By Mouth Once A Day 8)  Potassium Chloride Cr 10 Meq Cr-Caps (Potassium Chloride) .... Take One Tablet By Mouth Daily 9)  Singulair 10 Mg Tabs (Montelukast Sodium) .... As Needed 10)  Nitrostat 0.4 Mg Subl (Nitroglycerin) .Marland Kitchen.. 1 By Mouth As Needed 11)  Metoprolol Succinate 25 Mg Xr24h-Tab (Metoprolol Succinate) .... Take One Tablet By Mouth Daily 12)  Combivent 103-18 Mcg/act Aero (Ipratropium-Albuterol) .... As Needed 13)  Isosorbide Mononitrate Cr 60 Mg Xr24h-Tab (Isosorbide Mononitrate) .... Take One Tablet By Mouth Daily  Allergies (verified): No  Known Drug Allergies  Past History:  Past Medical History: Last updated: 10/28/2009 HYPERTENSION  .Marland Kitchenno RAS..cath 2010 Dyslipidemia Hypothyroidism Chest pain... Myoview 2006 no ischemia  /  no ischemia...nuclear  05/2008 /  CAD CAD..cath 09/23/2009.Marland KitchenMarland Kitchen70% Dx1(diffuse disease)..40% Cx, Large RCA  80-90% with heavy calcification.. LV function.. 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 Mariel Sleet Imdur intolerance.... severe headaches.... January, 2011.  This  Review of Systems       Patient denies fever, chills, headache, sweats, rash, change in vision, change in hearing, cough, nausea vomiting, urinary symptoms.  All other systems are reviewed and are negative.  Vital Signs:  Patient profile:   77 year old male Height:      74 inches Weight:      210 pounds BMI:     27.06 Pulse rate:   60 / minute BP sitting:   138 / 62  (left arm) Cuff size:   regular  Vitals Entered By: Hardin Negus, RMA (January 21, 2010 2:13 PM)  Physical Exam  General:  patient is stable. Eyes:  no xanthelasma. Neck:  no jugular venous distention. Lungs:  lungs are clear.  Respiratory effort is nonlabored. Heart:  cardiac exam reveals an S1-S2.  No clicks or significant murmurs. Abdomen:  abdomen is soft. Extremities:  no peripheral edema. Psych:  patient is oriented to person time and place affect is normal.   Impression & Recommendations:  Problem # 1:  CAD (ICD-414.00)  His updated medication list for this problem includes:    Norvasc 10 Mg Tabs (Amlodipine besylate) .Marland Kitchen... 1 by mouth daily    Nitrostat 0.4 Mg Subl (Nitroglycerin) .Marland Kitchen... 1 by mouth as needed    Metoprolol Succinate 25 Mg Xr24h-tab (Metoprolol succinate) .Marland Kitchen... Take  one tablet by mouth daily    Isosorbide Mononitrate Cr 60 Mg Xr24h-tab (Isosorbide mononitrate) .Marland Kitchen... Take one tablet by mouth daily Coronary disease is stable.  I have encouraged exercise but in moderation.  Problem # 2:  NOSEBLEED (ICD-784.7) Fortunately his last nosebleed was on October 29 2009.  Problem # 3:  HYPERTENSION (ICD-401.9)  His updated medication list for this problem includes:    Norvasc 10 Mg Tabs (Amlodipine besylate) .Marland Kitchen... 1 by mouth daily    Losartan Potassium 100 Mg Tabs (Losartan potassium) .Marland Kitchen... Take 1 tablet by mouth once a day    Metoprolol Succinate 25 Mg Xr24h-tab (Metoprolol succinate) .Marland Kitchen... Take one tablet by mouth daily Blood pressure is under good control on his current medicines.  No change in therapy.  Patient Instructions: 1)  Your physician recommends that you schedule a follow-up appointment in: 6 mths 2)  Your physician recommends that you continue on your current medications as directed. Please refer to the Current Medication list given to you today.

## 2010-11-12 NOTE — Miscellaneous (Signed)
  Clinical Lists Changes  Observations: Added new observation of PRIMARY MD: McGough (07/23/2010 12:38)

## 2010-11-12 NOTE — Progress Notes (Signed)
Summary: meds/headache  Phone Note Call from Patient Call back at Home Phone [phone]   Caller: Patient Reason for Call: Talk to Nurse Summary of Call: isosorbide is giving him a headache, he has stopped taking it Initial call taken by: Migdalia Dk,  October 23, 2009 4:25 PM  Follow-up for Phone Call        pt c/o intense headache since increasing Imdur to 60mg  daily--pt states he is unable to tolerate 60mg  but has tolerated Imdur 30mg  in the past--pt denies any chest pain/discomfort.Marland KitchenMarland Kitchenpt will decrease to Imdur 30mg  daily,use NTG as needed for chest pain and discuss with Dr Myrtis Ser at [facility] 10-28-09--will forward to Dr Myrtis Ser for review     New/Updated Medications: ISOSORBIDE MONONITRATE CR 30 MG XR24H-TAB (ISOSORBIDE MONONITRATE) Take one tablet by mouth daily   Current Medications (verified): 1)  Tricor 145 Mg Tabs (Fenofibrate) .... Take 1 Tablet By Mouth Once A Day 2)  Levothyroxine Sodium 125 Mcg Tabs (Levothyroxine Sodium) .... Take 1 Tablet By Mouth Once A Day 3)  Norvasc 10 Mg Tabs (Amlodipine Besylate) .Marland Kitchen.. 1 By Mouth Daily 4)  Multivitamins  Tabs (Multiple Vitamin) .... Take 1 Tablet By Mouth Once A Day 5)  Vitamin B-12 1000 Mcg Tabs (Cyanocobalamin) .... Take 1 Tablet By Mouth Once A Day 6)  Fish Oil 1000 Mg Caps (Omega-3 Fatty Acids) .... Take 1 Capsule By Mouth Once A Day 7)  Losartan Potassium 100 Mg Tabs (Losartan Potassium) .... Take 1 Tablet By Mouth Once A Day 8)  Potassium Chloride Cr 10 Meq Cr-Caps (Potassium Chloride) .... Take One Tablet By Mouth Daily 9)  Singulair 10 Mg Tabs (Montelukast Sodium) .... As Needed 10)  Nitrostat 0.4 Mg Subl (Nitroglycerin) .Marland Kitchen.. 1 By Mouth As Needed 11)  Isosorbide Mononitrate Cr 30 Mg Xr24h-Tab (Isosorbide Mononitrate) .... Take One Tablet By Mouth Daily 12)  Metoprolol Succinate 25 Mg Xr24h-Tab (Metoprolol Succinate) .... Take One Tablet By Mouth Daily  Allergies: No Known Drug Allergies  Appended Document:  meds/headache Agree.

## 2010-11-12 NOTE — Assessment & Plan Note (Signed)
Summary: 1 MO F/U   Visit Type:  Follow-up Primary Provider:  Patrica Duel, MD  CC:  CAD  / headaches.  History of Present Illness: The patient is seen for followup of coronary artery disease.  I saw him last September 26, 2009.  We have been trying to use imdur to try to prevent chest pain.  He has had significant headaches with this.  We have been in touch with him by telephone on several occasions.  His indoor dose was decreased from 60-30 and then he has not taken any today.  Today is the first day without a headache.  It seems very clear that he endorsed causing his headache.  After a prolonged discussion he told me that his Norvasc dose had been decreased recently.  I was not aware of this.  The patient asked me multiple questions about his exercise level.  I explained to him and his wife wanted him to remain active but that he must do his exercise in moderation.  We discussed multiple types of exercise.  I also reviewed records from [facility].  The patient had a renal mass removed in the past.  He is told that his renal mass is cured.  He was actually seen in the nephrology division to followup his renal function.  His creatinine is 1.5.  We know from his catheterization that he does not have any significant renal artery stenosis.  It also appears that a renal artery Doppler was done at [facility].  This also showed no renal artery stenosis.  I reviewed this with report.  Current Medications (verified): 1)  Tricor 145 Mg Tabs (Fenofibrate) .... Take 1 Tablet By Mouth Once A Day 2)  Levothyroxine Sodium 125 Mcg Tabs (Levothyroxine Sodium) .... Take 1 Tablet By Mouth Once A Day 3)  Norvasc 10 Mg Tabs (Amlodipine Besylate) .Marland Kitchen.. 1 By Mouth Daily 4)  Multivitamins  Tabs (Multiple Vitamin) .... Take 1 Tablet By Mouth Once A Day 5)  Vitamin B-12 1000 Mcg Tabs (Cyanocobalamin) .... Take 1 Tablet By Mouth Once A Day 6)  Fish Oil 1000 Mg Caps (Omega-3 Fatty Acids) .... Take 1  Capsule By Mouth Once A Day 7)  Losartan Potassium 100 Mg Tabs (Losartan Potassium) .... Take 1 Tablet By Mouth Once A Day 8)  Potassium Chloride Cr 10 Meq Cr-Caps (Potassium Chloride) .... Take One Tablet By Mouth Daily 9)  Singulair 10 Mg Tabs (Montelukast Sodium) .... As Needed 10)  Nitrostat 0.4 Mg Subl (Nitroglycerin) .Marland Kitchen.. 1 By Mouth As Needed 11)  Isosorbide Mononitrate Cr 30 Mg Xr24h-Tab (Isosorbide Mononitrate) .... Take One Tablet By Mouth Daily 12)  Metoprolol Succinate 25 Mg Xr24h-Tab (Metoprolol Succinate) .... Take One Tablet By Mouth Daily 13)  Combivent 103-18 Mcg/act Aero (Ipratropium-Albuterol) .... As Needed  Allergies (verified): No Known Drug Allergies  Past History:  Past Medical History: HYPERTENSION  .Marland Kitchenno RAS..cath 2010 Dyslipidemia Hypothyroidism Chest pain... Myoview 2006 no ischemia  /  no ischemia...nuclear  05/2008 /  CAD CAD..cath 09/23/2009.Marland KitchenMarland Kitchen70% Dx1(diffuse disease)..40% Cx, Large RCA  80-90% with heavy calcification.. LV function.. 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 Mariel Sleet Imdur intolerance.... severe headaches.... January, 2011.  This  Review of Systems  Patient denies fever, chills, sweats, rash.  There is no change in vision or hearing.  He has not been having any chest pain or shortness of breath or cough.  He denied having any GI or GU symptoms.  All other systems are reviewed and are negative.  Vital Signs:  Patient profile:   77 year old male Height:      74 inches Weight:      209 pounds BMI:     26.93 Pulse rate:   60 / minute BP sitting:   164 / 68  (left arm) Cuff size:   regular  Vitals Entered By: Hardin Negus, RMA (October 28, 2009 2:01  PM)  Physical Exam  General:  The patient is stable today. Head:  head is atraumatic. Eyes:  no xanthelasma. Neck:  no jugular venous distention. Chest Wall:  no chest wall tenderness. Lungs:  lungs are clear.  Respiratory effort is not labored. Heart:  cardiac exam is S1-S2.  There are no clicks or significant murmurs. Abdomen:  abdomen is soft. Msk:  no musculoskeletal deformities. Extremities:  no peripheral edema. Skin:  no skin rashes. Psych:  patient is oriented to person time and place.  Affect is normal.   Impression & Recommendations:  Problem # 1:  HEADACHE (ICD-784.0) The patient's headache is from Imdur.  This has been stopped.  Problem # 2:  CAD (ICD-414.00)  The following medications were removed from the medication list:    Isosorbide Mononitrate Cr 30 Mg Xr24h-tab (Isosorbide mononitrate) .Marland Kitchen... Take one tablet by mouth daily His updated medication list for this problem includes:    Norvasc 10 Mg Tabs (Amlodipine besylate) .Marland Kitchen... 1 by mouth daily    Nitrostat 0.4 Mg Subl (Nitroglycerin) .Marland Kitchen... 1 by mouth as needed    Metoprolol Succinate 25 Mg Xr24h-tab (Metoprolol succinate) .Marland Kitchen... Take one tablet by mouth daily  Coronary disease is stable.  I thought he had been on 10 mg of Norvasc but he has only been on 5 mg.  His dose will be  increased to 10 mg.  Problem # 3:  NOSEBLEED (ICD-784.7) The patient's nosebleeds have been a major problem.  It keeps him from taking aspirin or Plavix.  this prevents [place] from doing an intervention on his right coronary artery.  Fortunately he is not having significant symptoms.  He has not had a nosebleed however in one week.  Problem # 4:  HYPERTENSION (ICD-401.9)  His updated medication list for this problem includes:    Norvasc 10 Mg Tabs (Amlodipine besylate) .Marland Kitchen... 1 by mouth daily    Losartan Potassium 100 Mg Tabs (Losartan potassium) .Marland Kitchen... Take 1 tablet by mouth once a day    Metoprolol Succinate 25 Mg Xr24h-tab (Metoprolol  succinate) .Marland Kitchen... Take one tablet by mouth daily  The patient's blood pressure was elevated today.  After it became clear that his Norvasc has been at 5 mg daily we decided to increase it back up to 10 mg daily.  I will see him for followup in 3 months.  Patient Instructions: 1)  Stop Imdur 2)  Make sure to take Amlodipine 10mg  daily 3)  Follow up in 3 months

## 2010-11-17 ENCOUNTER — Encounter: Payer: Self-pay | Admitting: Cardiology

## 2010-11-17 ENCOUNTER — Ambulatory Visit (INDEPENDENT_AMBULATORY_CARE_PROVIDER_SITE_OTHER): Payer: Medicare Other | Admitting: Cardiology

## 2010-11-17 DIAGNOSIS — I2510 Atherosclerotic heart disease of native coronary artery without angina pectoris: Secondary | ICD-10-CM

## 2010-11-17 DIAGNOSIS — E782 Mixed hyperlipidemia: Secondary | ICD-10-CM

## 2010-11-17 DIAGNOSIS — R609 Edema, unspecified: Secondary | ICD-10-CM | POA: Insufficient documentation

## 2010-11-26 NOTE — Assessment & Plan Note (Signed)
Summary: 3 month rov.sl/kl   Visit Type:  Follow-up Primary Provider:  Karleen Hampshire, MD  CC:  CAD.  History of Present Illness: The patient is seen for followup of coronary artery disease.  He's done relatively well.  He is not having any rest pain or rest shortness of breath.  He does have some exertional shortness of breath.  I suspect that there may be an anginal component to this.  We know that he has a tight right coronary lesion.  This has been stable but could cause ischemia.  We have not done an intervention because he cannot take aspirin or Plavix because of long-term recurrent nosebleed.  His problem has not been able to be solved.    Current Medications (verified): 1)  Tricor 145 Mg Tabs (Fenofibrate) .... Take 1 Tablet By Mouth Once A Day 2)  Levothyroxine Sodium 125 Mcg Tabs (Levothyroxine Sodium) .... Take 1 Tablet By Mouth Once A Day 3)  Norvasc 10 Mg Tabs (Amlodipine Besylate) .Marland Kitchen.. 1 By Mouth Daily 4)  Multivitamins  Tabs (Multiple Vitamin) .... Take 1 Tablet By Mouth Once A Day 5)  Vitamin B-12 1000 Mcg Tabs (Cyanocobalamin) .... Take 1 Tablet By Mouth Once A Day 6)  Fish Oil 1000 Mg Caps (Omega-3 Fatty Acids) .... Take 1 Capsule By Mouth Once A Day 7)  Losartan Potassium 100 Mg Tabs (Losartan Potassium) .... Take 1 Tablet By Mouth Once A Day 8)  Potassium Chloride Cr 10 Meq Cr-Caps (Potassium Chloride) .... Take One Tablet By Mouth Daily 9)  Singulair 10 Mg Tabs (Montelukast Sodium) .... As Needed 10)  Nitrostat 0.4 Mg Subl (Nitroglycerin) .Marland Kitchen.. 1 By Mouth As Needed 11)  Metoprolol Succinate 25 Mg Xr24h-Tab (Metoprolol Succinate) .... Take 1/2 Tablet By Mouth Daily 12)  Combivent 103-18 Mcg/act Aero (Ipratropium-Albuterol) .... As Needed 13)  Flomax 0.4 Mg Caps (Tamsulosin Hcl) .... At Bedtime 14)  Flonase 50 Mcg/act Susp (Fluticasone Propionate) .... At Bedtime 15)  Simvastatin 40 Mg Tabs (Simvastatin) .... Take One Tablet By Mouth Daily At Bedtime  Allergies  (verified): No Known Drug Allergies  Past History:  Past Medical History: HYPERTENSION  .Marland Kitchenno RAS..cath 2010 Rash on the buttocks... post hospitalization September, 2011... hydralazine stopped but then restarted without return of rash Dyslipidemia Hypothyroidism Chest pain... Myoview 2006 no ischemia  /  no ischemia...nuclear  05/2008 /  CAD CAD..cath 09/23/2009.Marland KitchenMarland Kitchen70% Dx1(diffuse disease)..40% Cx, Large RCA  80-90% with heavy calcification.. /  06/2010... hospital with elevated BP and CPain...cath...no change...tight RCA still best to treat medically with nosebleeds.. LV function.. normal nuclear scan 2006  /  normal...nuclear  2009 /  EF 60%  cath...09/2009 Systolic click but no mitral valve prolapse.Marland Kitchen Nosebleeds...significant...can not take ASA Kidney mass    2008    radiofrequency ablation [facility] 2008.. 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 Mariel Sleet Imdur intolerance.... severe headaches.... January, 2011.  Memory decreasing.Marland KitchenMarland Kitchen9/2011 EF  60%...cath...06/2010 Edema  Review of Systems       Patient denies fever, chills, headache, sweats, rash, change in vision, change in hearing, chest pain, cough, nausea vomiting, urinary symptoms.  All other systems are reviewed and are negative.  Vital Signs:  Patient profile:   77 year old male Height:      74 inches Weight:      217 pounds BMI:     27.96 Pulse rate:   65 / minute BP sitting:   136 / 60  (left arm) Cuff size:   regular  Vitals Entered By: Hardin Negus, RMA (November 17, 2010 2:39 PM)  Physical Exam  General:  patient overall is stable today. Head:  head is atraumatic. Eyes:  no xanthelasma. Neck:  no jugular venous distention. Chest Wall:  no chest wall tenderness. Lungs:  lungs are clear.  Respiratory effort is nonlabored. Heart:  cardiac exam reveals S1-S2.  There is a soft systolic murmur. Abdomen:  abdomen is soft. Msk:  no musculoskeletal  deformities. Extremities:  there is 1+ peripheral edema and Skin:  no skin rashes. Psych:  patient is oriented to person time and place. Affect is normal.   Impression & Recommendations:  Problem # 1:  EDEMA (ICD-782.3) The patient does have some edema today.  It comes on during the daytime and is completely gone the next morning after being in bed.  He does not have PND or orthopnea.  He does have exertional shortness of breath.  However I believe that his edema is from venous insufficiency.  I've chosen not to start a diuretic as of today.  Problem # 2:  HEADACHE (ICD-784.0) Fortunately he has not had any significant recurring headaches.  No further workup.  Problem # 3:  CAD (ICD-414.00) The patient has significant coronary disease.  He is having exertional shortness of breath.  At the same time his blood pressure is on the low side.  I will decrease his amlodipine from 10-5 mg daily.  The same time I will increase his beta blocker.  I am hoping that this will increase his exercise tolerance.  He is not a candidate for elective coronary intervention as he cannot use aspirin or Plavix.  Problem # 4:  NOSEBLEED (ICD-784.7) His nosebleeds continue to be a significant issue.  He keeps a record of them.  He had 2 nosebleeds in January, 2012.  They've only lasted 10-15 minute range.  Problem # 5:  HYPOTHYROIDISM (ICD-244.9)  His updated medication list for this problem includes:    Levothyroxine Sodium 125 Mcg Tabs (Levothyroxine sodium) .Marland Kitchen... Take 1 tablet by mouth once a day    thyroid history.  No change in therapy.  Problem # 6:  DYSLIPIDEMIA (ICD-272.4) I have reviewed the patient's labs that he brought in today.  His LDL is 62.  Triglycerides are 95.  HDL is 31.  He is at goal for LDL.  Therefore at this time it is not recommended that niacin be added for his HDL.  No change in therapy.  Problem # 7:  HYPERTENSION (ICD-401.9) This time hypertension is not a problem.  As a matter of  fact his pressure is running on the low side.  As outlined above amlodipine dose will be decreased.  I'll see him back for followup to see how he is feeling and then we will consider other adjustments.  Appended Document: instructions    Clinical Lists Changes  Medications: Changed medication from NORVASC 10 MG TABS (AMLODIPINE BESYLATE) 1 by mouth daily to AMLODIPINE BESYLATE 5 MG TABS (AMLODIPINE BESYLATE) Take one tablet by mouth daily - Signed Changed medication from METOPROLOL SUCCINATE 25 MG XR24H-TAB (METOPROLOL SUCCINATE) Take 1/2 tablet by mouth daily to METOPROLOL SUCCINATE 25 MG XR24H-TAB (METOPROLOL SUCCINATE) Take 1 tablet by mouth daily - Signed Observations: Added new observation of PI CARDIO: Decrease Amlodipine to 5mg  daily Increase Metoprolol ER to 25mg  daily Follow up in 3-4 weeks (11/17/2010 15:15)       Patient Instructions: 1)  Decrease Amlodipine to 5mg  daily 2)  Increase Metoprolol ER to  25mg  daily 3)  Follow up in 3-4 weeks

## 2010-12-24 ENCOUNTER — Encounter: Payer: Self-pay | Admitting: Cardiology

## 2010-12-24 ENCOUNTER — Ambulatory Visit (INDEPENDENT_AMBULATORY_CARE_PROVIDER_SITE_OTHER): Payer: Medicare Other | Admitting: Cardiology

## 2010-12-24 DIAGNOSIS — I2510 Atherosclerotic heart disease of native coronary artery without angina pectoris: Secondary | ICD-10-CM

## 2010-12-24 DIAGNOSIS — I10 Essential (primary) hypertension: Secondary | ICD-10-CM

## 2010-12-24 DIAGNOSIS — E039 Hypothyroidism, unspecified: Secondary | ICD-10-CM

## 2010-12-24 DIAGNOSIS — R0602 Shortness of breath: Secondary | ICD-10-CM

## 2010-12-24 DIAGNOSIS — R609 Edema, unspecified: Secondary | ICD-10-CM

## 2010-12-24 LAB — CARDIAC PANEL(CRET KIN+CKTOT+MB+TROPI)
CK, MB: 1.1 ng/mL (ref 0.3–4.0)
CK, MB: 1.3 ng/mL (ref 0.3–4.0)
Relative Index: INVALID (ref 0.0–2.5)
Relative Index: INVALID (ref 0.0–2.5)
Total CK: 70 U/L (ref 7–232)
Total CK: 71 U/L (ref 7–232)
Troponin I: 0.02 ng/mL (ref 0.00–0.06)
Troponin I: 0.02 ng/mL (ref 0.00–0.06)

## 2010-12-24 LAB — HEMOGLOBIN A1C
Hgb A1c MFr Bld: 6 % — ABNORMAL HIGH (ref <5.7)
Mean Plasma Glucose: 126 mg/dL — ABNORMAL HIGH (ref <117)

## 2010-12-24 LAB — BASIC METABOLIC PANEL
BUN: 19 mg/dL (ref 6–23)
BUN: 21 mg/dL (ref 6–23)
CO2: 23 mEq/L (ref 19–32)
CO2: 25 mEq/L (ref 19–32)
Calcium: 9.1 mg/dL (ref 8.4–10.5)
Calcium: 9.6 mg/dL (ref 8.4–10.5)
Chloride: 106 mEq/L (ref 96–112)
Chloride: 107 mEq/L (ref 96–112)
Chloride: 109 mEq/L (ref 96–112)
Creatinine, Ser: 1.52 mg/dL — ABNORMAL HIGH (ref 0.4–1.5)
Creatinine, Ser: 1.54 mg/dL — ABNORMAL HIGH (ref 0.4–1.5)
Creatinine, Ser: 1.58 mg/dL — ABNORMAL HIGH (ref 0.4–1.5)
GFR calc Af Amer: 51 mL/min — ABNORMAL LOW (ref >60)
GFR calc Af Amer: 53 mL/min — ABNORMAL LOW (ref >60)
GFR calc Af Amer: 53 mL/min — ABNORMAL LOW (ref >60)
GFR calc non Af Amer: 42 mL/min — ABNORMAL LOW (ref >60)
GFR calc non Af Amer: 43 mL/min — ABNORMAL LOW (ref >60)
Glucose, Bld: 121 mg/dL — ABNORMAL HIGH (ref 70–99)
Glucose, Bld: 123 mg/dL — ABNORMAL HIGH (ref 70–99)
Potassium: 3.6 mEq/L (ref 3.5–5.1)
Potassium: 4.1 mEq/L (ref 3.5–5.1)
Sodium: 138 mEq/L (ref 135–145)
Sodium: 139 mEq/L (ref 135–145)
Sodium: 141 mEq/L (ref 135–145)

## 2010-12-24 LAB — CBC
HCT: 36 % — ABNORMAL LOW (ref 39.0–52.0)
HCT: 36.3 % — ABNORMAL LOW (ref 39.0–52.0)
HCT: 36.5 % — ABNORMAL LOW (ref 39.0–52.0)
Hemoglobin: 11.7 g/dL — ABNORMAL LOW (ref 13.0–17.0)
Hemoglobin: 11.9 g/dL — ABNORMAL LOW (ref 13.0–17.0)
Hemoglobin: 12 g/dL — ABNORMAL LOW (ref 13.0–17.0)
Hemoglobin: 13.1 g/dL (ref 13.0–17.0)
MCH: 28 pg (ref 26.0–34.0)
MCH: 28.3 pg (ref 26.0–34.0)
MCH: 28.3 pg (ref 26.0–34.0)
MCHC: 32.5 g/dL (ref 30.0–36.0)
MCHC: 32.6 g/dL (ref 30.0–36.0)
MCHC: 33.1 g/dL (ref 30.0–36.0)
MCV: 84.8 fL (ref 78.0–100.0)
MCV: 86.3 fL (ref 78.0–100.0)
MCV: 86.9 fL (ref 78.0–100.0)
MCV: 87.2 fL (ref 78.0–100.0)
Platelets: 205 10*3/uL (ref 150–400)
Platelets: 215 10*3/uL (ref 150–400)
Platelets: 220 10*3/uL (ref 150–400)
Platelets: 221 10*3/uL (ref 150–400)
RBC: 4.13 MIL/uL — ABNORMAL LOW (ref 4.22–5.81)
RBC: 4.2 MIL/uL — ABNORMAL LOW (ref 4.22–5.81)
RBC: 4.28 MIL/uL (ref 4.22–5.81)
RBC: 4.54 MIL/uL (ref 4.22–5.81)
RDW: 14.3 % (ref 11.5–15.5)
RDW: 14.5 % (ref 11.5–15.5)
RDW: 14.7 % (ref 11.5–15.5)
WBC: 4 10*3/uL (ref 4.0–10.5)
WBC: 4.8 10*3/uL (ref 4.0–10.5)
WBC: 5.4 10*3/uL (ref 4.0–10.5)
WBC: 5.5 10*3/uL (ref 4.0–10.5)

## 2010-12-24 LAB — GLUCOSE, CAPILLARY
Glucose-Capillary: 100 mg/dL — ABNORMAL HIGH (ref 70–99)
Glucose-Capillary: 105 mg/dL — ABNORMAL HIGH (ref 70–99)
Glucose-Capillary: 122 mg/dL — ABNORMAL HIGH (ref 70–99)
Glucose-Capillary: 128 mg/dL — ABNORMAL HIGH (ref 70–99)
Glucose-Capillary: 158 mg/dL — ABNORMAL HIGH (ref 70–99)
Glucose-Capillary: 87 mg/dL (ref 70–99)

## 2010-12-24 LAB — HEPATIC FUNCTION PANEL
ALT: 15 U/L (ref 0–53)
Alkaline Phosphatase: 31 U/L — ABNORMAL LOW (ref 39–117)
Total Protein: 7.8 g/dL (ref 6.0–8.3)

## 2010-12-24 LAB — LIPID PANEL
Cholesterol: 132 mg/dL (ref 0–200)
HDL: 19 mg/dL — ABNORMAL LOW (ref >39)
LDL Cholesterol: 94 mg/dL (ref 0–99)
Total CHOL/HDL Ratio: 6.9 RATIO
Triglycerides: 93 mg/dL (ref <150)
VLDL: 19 mg/dL (ref 0–40)

## 2010-12-24 LAB — DIFFERENTIAL
Eosinophils Absolute: 0 10*3/uL (ref 0.0–0.7)
Eosinophils Relative: 1 % (ref 0–5)
Lymphs Abs: 2.2 10*3/uL (ref 0.7–4.0)
Monocytes Absolute: 0.6 10*3/uL (ref 0.1–1.0)
Monocytes Relative: 11 % (ref 3–12)
Neutrophils Relative %: 42 % — ABNORMAL LOW (ref 43–77)

## 2010-12-24 LAB — POCT CARDIAC MARKERS
Myoglobin, poc: 82.5 ng/mL (ref 12–200)
Myoglobin, poc: 84.1 ng/mL (ref 12–200)

## 2010-12-24 LAB — PROTIME-INR
INR: 1.07 (ref 0.00–1.49)
INR: 1.2 (ref 0.00–1.49)
Prothrombin Time: 14.1 seconds (ref 11.6–15.2)
Prothrombin Time: 15.4 seconds — ABNORMAL HIGH (ref 11.6–15.2)

## 2010-12-24 LAB — BRAIN NATRIURETIC PEPTIDE: Pro B Natriuretic peptide (BNP): 144 pg/mL — ABNORMAL HIGH (ref 0.0–100.0)

## 2010-12-24 LAB — HEPARIN LEVEL (UNFRACTIONATED)
Heparin Unfractionated: 0.37 IU/mL (ref 0.30–0.70)
Heparin Unfractionated: 0.51 IU/mL (ref 0.30–0.70)

## 2010-12-24 LAB — APTT: aPTT: 35 seconds (ref 24–37)

## 2010-12-24 LAB — MRSA PCR SCREENING: MRSA by PCR: NEGATIVE

## 2010-12-24 NOTE — Progress Notes (Signed)
HPI The patient is here to followup hypertension.  We have adjusted his medicines.  He is having some shortness of breath and a cough.  I believe that the cough is the major issue.  It is nonproductive. Allergies not on file  Current Outpatient Prescriptions  Medication Sig Dispense Refill  . albuterol-ipratropium (COMBIVENT) 18-103 MCG/ACT inhaler Inhale 2 puffs into the lungs as needed.        Marland Kitchen amLODipine (NORVASC) 10 MG tablet Take 10 mg by mouth daily.        . fenofibrate (TRICOR) 145 MG tablet Take 145 mg by mouth daily.        . fish oil-omega-3 fatty acids 1000 MG capsule Take 1 capsule by mouth daily.        . fluticasone (FLONASE) 50 MCG/ACT nasal spray 1 spray by Nasal route at bedtime.        Marland Kitchen HYDROcodone-acetaminophen (NORCO) 10-325 MG per tablet Take 1 tablet by mouth as needed.        . isosorbide mononitrate (IMDUR) 60 MG 24 hr tablet Take 60 mg by mouth daily.        Marland Kitchen levothyroxine (SYNTHROID, LEVOTHROID) 125 MCG tablet Take 125 mcg by mouth daily.        Marland Kitchen losartan (COZAAR) 100 MG tablet Take 100 mg by mouth daily.        . metoprolol (TOPROL-XL) 25 MG 24 hr tablet Take 25 mg by mouth daily. 1/2 tab daily       . montelukast (SINGULAIR) 10 MG tablet Take 10 mg by mouth as needed.        . Multiple Vitamin (MULTIVITAMIN) tablet Take 1 tablet by mouth daily.        . nitroGLYCERIN (NITROSTAT) 0.4 MG SL tablet Place 0.4 mg under the tongue as needed.        . potassium chloride (KLOR-CON) 10 MEQ CR tablet Take 10 mEq by mouth daily.        . simvastatin (ZOCOR) 40 MG tablet Take 40 mg by mouth at bedtime.        . Tamsulosin HCl (FLOMAX) 0.4 MG CAPS Take 0.4 mg by mouth daily.        . vitamin B-12 (CYANOCOBALAMIN) 1000 MCG tablet Take 1,000 mcg by mouth daily.          History   Social History  . Marital Status: Married    Spouse Name: N/A    Number of Children: N/A  . Years of Education: N/A   Occupational History  . Retired    Social History Main Topics  .  Smoking status: Never Smoker   . Smokeless tobacco: Not on file  . Alcohol Use: No  . Drug Use:   . Sexually Active:    Other Topics Concern  . Not on file   Social History Narrative  . No narrative on file    Family History  Problem Relation Age of Onset  . Coronary artery disease      Past Medical History  Diagnosis Date  . Hypertension     no RAS  . Dyslipidemia   . Hypothyroidism   . Chest pain     myoview 2006 no ischemia/ myoview 2009 no ischemia  . CAD (coronary artery disease)     cath 09/23/09 70% Dx1 (diffuse disease), 40% Cx, Large RCA 80-90% with heavy calcification / cath 06/2010 no change tiht RCA still best to treat medially with nosebleeds  . Left ventricular dysfunction  myoview 2006 normal / myoview 2009 normal / EF 60% cath 09/2009, EF 60% cath 06/2010  . Systolic click     no mitral valve prolapse  . Nosebleed     significant, can not take ASA  . Kidney mass 2008    radiofrequency ablation at [facility]  . 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 Memory impairment     memory decreasing 06/2010  . Rash Sept 2011    rash on buttocks, hospitalized hydralazine stopped but then restarted w/o return of rash    No past surgical history on file.  ROS  Patient denies fever, chills, headache, sweats, rash, change in vision, change in hearing, chest pain, nausea vomiting, urinary symptoms.  All other systems are reviewed and are negative. PHYSICAL EXAM The patient is stable today.  He is oriented to person time and place.  Affect is normal.  He is here with his wife today.  Head is atraumatic.  No xanthelasma.  No jugular venous distention.  Lungs reveal no rales.  He does have a few expiratory wheezes.  He tends to cough when he tries to take a deep breath.  Cardiac exam reveals S1-S2.  There are no clicks or significant murmurs.  The abdomen is protuberant.  There is no significant peripheral edema.  There are no  significant musculoskeletal deformities. There were no vitals filed for this visit.  EKG

## 2010-12-24 NOTE — Assessment & Plan Note (Signed)
Coronary disease is stable. No change in therapy. 

## 2010-12-24 NOTE — Progress Notes (Deleted)
Subjective:      Patient ID: Brian Mcmillan is a 77 y.o. male.  Chief Complaint: HPI {Common ambulatory SmartLinks:19316} ROS    Objective:    Physical Exam  Lab Review:  {Recent labs:19471::"not applicable"}    Assessment:     No diagnosis found.   Plan:     ***

## 2010-12-24 NOTE — Assessment & Plan Note (Signed)
The patient's thyroid is treated.  No change in therapy.

## 2010-12-24 NOTE — Assessment & Plan Note (Signed)
Blood pressure ranges from 100 systolic to 155 systolic.  Unfortunately I do not think we can do any better with any further medicine changes.  No change in his meds at this time.

## 2010-12-24 NOTE — Assessment & Plan Note (Signed)
He does not have any edema at this time.  No change in therapy.

## 2010-12-26 LAB — COMPREHENSIVE METABOLIC PANEL
ALT: 24 U/L (ref 0–53)
AST: 45 U/L — ABNORMAL HIGH (ref 0–37)
Albumin: 3.7 g/dL (ref 3.5–5.2)
Alkaline Phosphatase: 27 U/L — ABNORMAL LOW (ref 39–117)
BUN: 21 mg/dL (ref 6–23)
GFR calc Af Amer: 50 mL/min — ABNORMAL LOW (ref >60)
Potassium: 3.8 mEq/L (ref 3.5–5.1)
Sodium: 138 mEq/L (ref 135–145)
Total Protein: 7.4 g/dL (ref 6.0–8.3)

## 2010-12-26 LAB — DIFFERENTIAL
Basophils Relative: 0 % (ref 0–1)
Monocytes Absolute: 0.5 10*3/uL (ref 0.1–1.0)
Monocytes Relative: 16 % — ABNORMAL HIGH (ref 3–12)
Neutro Abs: 1.4 10*3/uL — ABNORMAL LOW (ref 1.7–7.7)

## 2010-12-26 LAB — CBC
Platelets: 202 10*3/uL (ref 150–400)
RDW: 15 % (ref 11.5–15.5)

## 2010-12-26 LAB — PROTEIN ELECTROPH W RFLX QUANT IMMUNOGLOBULINS
Alpha-2-Globulin: 10.6 % (ref 7.1–11.8)
Immunofixation Result, Serum: 16042889
M-Spike, %: 0.34 g/dL
Total Protein ELP: 7.6 g/dL (ref 6.0–8.3)

## 2010-12-26 LAB — IGG, IGA, IGM: IgA: 95 mg/dL (ref 68–378)

## 2010-12-27 LAB — COMPREHENSIVE METABOLIC PANEL
ALT: 18 U/L (ref 0–53)
AST: 37 U/L (ref 0–37)
Alkaline Phosphatase: 23 U/L — ABNORMAL LOW (ref 39–117)
CO2: 25 mEq/L (ref 19–32)
Chloride: 106 mEq/L (ref 96–112)
GFR calc Af Amer: 49 mL/min — ABNORMAL LOW (ref >60)
GFR calc non Af Amer: 40 mL/min — ABNORMAL LOW (ref >60)
Potassium: 4.1 mEq/L (ref 3.5–5.1)
Sodium: 137 mEq/L (ref 135–145)
Total Bilirubin: 0.7 mg/dL (ref 0.3–1.2)

## 2010-12-27 LAB — DIFFERENTIAL
Basophils Absolute: 0 10*3/uL (ref 0.0–0.1)
Eosinophils Absolute: 0 10*3/uL (ref 0.0–0.7)
Eosinophils Relative: 1 % (ref 0–5)

## 2010-12-27 LAB — CBC
Hemoglobin: 12.3 g/dL — ABNORMAL LOW (ref 13.0–17.0)
MCH: 30.3 pg (ref 26.0–34.0)
RBC: 4.06 MIL/uL — ABNORMAL LOW (ref 4.22–5.81)

## 2010-12-27 LAB — PROTEIN ELECTROPHORESIS, SERUM
Albumin ELP: 50.6 % — ABNORMAL LOW (ref 55.8–66.1)
Alpha-1-Globulin: 4 % (ref 2.9–4.9)
Alpha-2-Globulin: 11.1 % (ref 7.1–11.8)
Beta 2: 4.3 % (ref 3.2–6.5)
Gamma Globulin: 24.5 % — ABNORMAL HIGH (ref 11.1–18.8)

## 2010-12-27 LAB — PROTIME-INR: Prothrombin Time: 14.8 seconds (ref 11.6–15.2)

## 2010-12-27 LAB — IMMUNOFIXATION ELECTROPHORESIS
IgG (Immunoglobin G), Serum: 1900 mg/dL — ABNORMAL HIGH (ref 694–1618)
IgM, Serum: 366 mg/dL — ABNORMAL HIGH (ref 60–263)

## 2010-12-29 NOTE — Assessment & Plan Note (Signed)
Summary: ov/per heather s/per sharon/jml   Primary Provider:  Karleen Hampshire, MD   History of Present Illness: today's note is done in EPIC  Current Medications (verified): 1)  Tricor 145 Mg Tabs (Fenofibrate) .... Take 1 Tablet By Mouth Once A Day 2)  Levothyroxine Sodium 125 Mcg Tabs (Levothyroxine Sodium) .... Take 1 Tablet By Mouth Once A Day 3)  Amlodipine Besylate 5 Mg Tabs (Amlodipine Besylate) .... Take One Tablet By Mouth Daily 4)  Multivitamins  Tabs (Multiple Vitamin) .... Take 1 Tablet By Mouth Once A Day 5)  Vitamin B-12 1000 Mcg Tabs (Cyanocobalamin) .... Take 1 Tablet By Mouth Once A Day 6)  Fish Oil 1000 Mg Caps (Omega-3 Fatty Acids) .... Take 1 Capsule By Mouth Once A Day 7)  Losartan Potassium 100 Mg Tabs (Losartan Potassium) .... Take 1 Tablet By Mouth Once A Day 8)  Potassium Chloride Cr 10 Meq Cr-Caps (Potassium Chloride) .... Take One Tablet By Mouth Daily 9)  Singulair 10 Mg Tabs (Montelukast Sodium) .... As Needed 10)  Nitrostat 0.4 Mg Subl (Nitroglycerin) .Marland Kitchen.. 1 By Mouth As Needed 11)  Metoprolol Succinate 25 Mg Xr24h-Tab (Metoprolol Succinate) .... Take 1 Tablet By Mouth Daily 12)  Combivent 103-18 Mcg/act Aero (Ipratropium-Albuterol) .... As Needed 13)  Flomax 0.4 Mg Caps (Tamsulosin Hcl) .... At Bedtime 14)  Flonase 50 Mcg/act Susp (Fluticasone Propionate) .... At Bedtime 15)  Simvastatin 40 Mg Tabs (Simvastatin) .... Take One Tablet By Mouth Daily At Bedtime  Allergies (verified): No Known Drug Allergies  Past History:  Past Medical History: Last updated: 11/17/2010 HYPERTENSION  .Marland Kitchenno RAS..cath 2010 Rash on the buttocks... post hospitalization September, 2011... hydralazine stopped but then restarted without return of rash Dyslipidemia Hypothyroidism Chest pain... Myoview 2006 no ischemia  /  no ischemia...nuclear  05/2008 /  CAD CAD..cath 09/23/2009.Marland KitchenMarland Kitchen70% Dx1(diffuse disease)..40% Cx, Large RCA  80-90% with heavy calcification.. /  06/2010...  hospital with elevated BP and CPain...cath...no change...tight RCA still best to treat medically with nosebleeds.. LV function.. normal nuclear scan 2006  /  normal...nuclear  2009 /  EF 60%  cath...09/2009 Systolic click but no mitral valve prolapse.Marland Kitchen Nosebleeds...significant...can not take ASA Kidney mass    2008    radiofrequency ablation [facility] 2008.. 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 Mariel Sleet Imdur intolerance.... severe headaches.... January, 2011.  Memory decreasing.Marland KitchenMarland Kitchen9/2011 EF  60%...cath...06/2010 Edema  Vital Signs:  Patient profile:   77 year old male Weight:      213 pounds Pulse rate:   52 / minute BP sitting:   138 / 60  (left arm)  Vitals Entered By: Meredith Staggers, RN (December 24, 2010 11:40 AM)   Patient Instructions: 1)  Follow up in 3 months.

## 2011-01-12 LAB — POCT I-STAT 3, VENOUS BLOOD GAS (G3P V)
TCO2: 26 mmol/L (ref 0–100)
pCO2, Ven: 40.9 mmHg — ABNORMAL LOW (ref 45.0–50.0)
pH, Ven: 7.383 — ABNORMAL HIGH (ref 7.250–7.300)

## 2011-01-12 LAB — POCT I-STAT 3, ART BLOOD GAS (G3+)
Bicarbonate: 24 mEq/L (ref 20.0–24.0)
pCO2 arterial: 36.3 mmHg (ref 35.0–45.0)
pO2, Arterial: 69 mmHg — ABNORMAL LOW (ref 80.0–100.0)

## 2011-01-19 LAB — COMPREHENSIVE METABOLIC PANEL
Albumin: 3.9 g/dL (ref 3.5–5.2)
BUN: 17 mg/dL (ref 6–23)
Calcium: 9.4 mg/dL (ref 8.4–10.5)
Creatinine, Ser: 1.43 mg/dL (ref 0.4–1.5)
Glucose, Bld: 98 mg/dL (ref 70–99)
Potassium: 3.3 mEq/L — ABNORMAL LOW (ref 3.5–5.1)
Total Protein: 8 g/dL (ref 6.0–8.3)

## 2011-01-19 LAB — CBC
HCT: 39.3 % (ref 39.0–52.0)
MCHC: 35 g/dL (ref 30.0–36.0)
MCV: 85 fL (ref 78.0–100.0)
Platelets: 240 10*3/uL (ref 150–400)
RDW: 14 % (ref 11.5–15.5)

## 2011-01-19 LAB — DIFFERENTIAL
Lymphocytes Relative: 45 % (ref 12–46)
Lymphs Abs: 1.6 10*3/uL (ref 0.7–4.0)
Monocytes Absolute: 0.5 10*3/uL (ref 0.1–1.0)
Monocytes Relative: 14 % — ABNORMAL HIGH (ref 3–12)
Neutro Abs: 1.4 10*3/uL — ABNORMAL LOW (ref 1.7–7.7)
Neutrophils Relative %: 40 % — ABNORMAL LOW (ref 43–77)

## 2011-01-19 LAB — PROTEIN ELECTROPHORESIS, SERUM
Albumin ELP: 52.7 % — ABNORMAL LOW (ref 55.8–66.1)
Alpha-1-Globulin: 3.5 % (ref 2.9–4.9)
Alpha-2-Globulin: 10.3 % (ref 7.1–11.8)
Beta Globulin: 5.8 % (ref 4.7–7.2)
Total Protein ELP: 7.8 g/dL (ref 6.0–8.3)

## 2011-01-19 LAB — IMMUNOFIXATION ELECTROPHORESIS
IgA: 106 mg/dL (ref 68–378)
IgM, Serum: 341 mg/dL — ABNORMAL HIGH (ref 60–263)

## 2011-02-04 ENCOUNTER — Other Ambulatory Visit: Payer: Self-pay | Admitting: *Deleted

## 2011-02-04 ENCOUNTER — Telehealth: Payer: Self-pay | Admitting: Cardiology

## 2011-02-04 MED ORDER — METOPROLOL SUCCINATE ER 25 MG PO TB24: ORAL_TABLET | ORAL | Status: DC

## 2011-02-04 NOTE — Telephone Encounter (Signed)
Pt needs toprol to be called in to the pharmacy. Pharmacist states they have fax called and not received an answer.

## 2011-02-05 ENCOUNTER — Telehealth: Payer: Self-pay | Admitting: Cardiology

## 2011-02-05 MED ORDER — METOPROLOL SUCCINATE ER 25 MG PO TB24: 25.0000 mg | ORAL_TABLET | Freq: Every day | ORAL | Status: DC

## 2011-02-05 NOTE — Telephone Encounter (Signed)
Got a e-scribe yesterday Metoprolol 25mg  1/2 qd.  New pres should be 1 qd.  Please call phar with updated infor

## 2011-02-23 NOTE — Discharge Summary (Signed)
Brian Mcmillan, Brian Mcmillan              ACCOUNT NO.:  [id number]   MEDICAL RECORD NO.:  [id number]          PATIENT TYPE:  INP   LOCATION:  1525                         FACILITY:  [facility]   PHYSICIAN:  Currie Paris, M.D.DATE OF BIRTH:  1925/03/23   DATE OF ADMISSION:  01/29/2008  DATE OF DISCHARGE:  02/03/2008                               DISCHARGE SUMMARY   CCS# [id number].   FINAL DIAGNOSES:  1. Mesenteric mass - mesenteric panniculitis.  2. Postoperative urinary retention.  3. History of hypothyroidism.  4. History of hyperlipidemia.   CLINICAL HISTORY:  This is an 77 year old gentleman who had exploratory  laparoscopy with a diagnosis of mesenteric mass 9 months ago which just  showed  fat necrosis.  His PET scan has continued to be worrisome for  lymphoma and it was decided at this point to do an open biopsy for  tissue diagnosis.  Details are noted in admission note.   HOSPITAL COURSE:  The patient was admitted and taken to the operating  room where a exploratory laparotomy and diagnosis of chronic  inflammatory mass of the mesentery was made.  This appeared to be all  fat related.  Some incidentally noted chylous fluid was noted.  There  appeared to be diffuse involvement at the root and the base of the  mesentery with retraction of the mesentery towards its root.  It  appeared to be following the mesenteric vessels.   Postoperatively the patient did well.  We removed his Foley catheter but  he was unable to void the next day so the Foley catheter was replaced.  He had a bowel movement by the third postoperative day.  He was  tolerating diet by the fourth day and passing gas.  He was placed on  oral pain medications and by the fifth postop day was doing well and  able to be discharged.  He was discharged to home to be followed up in  our office.  He was in satisfactory condition.   LABORATORY STUDIES:  Preoperative hemoglobin of 14 and postoperative of  12.  Electrolytes  were basically normal.  Urinalysis was negative.  EKG  showed a few PVCs but otherwise was normal perioperatively.   Pathology report was consistent with a fat necrosis and fibrosis  consistent with a mesenteric panniculitis.  PAS, fungal,  AFB, and GMS  stains were negative.  The patient is to be followed subsequently by Dr.  Mariel Sleet as well as by myself for postoperative care.      Currie Paris, M.D.  Electronically Signed     CJS/MEDQ  D:  02/14/2008  T:  02/14/2008  Job:  [id number]   cc:   Ladona Horns. Mariel Sleet, MD  Fax: [phone]

## 2011-02-23 NOTE — Assessment & Plan Note (Signed)
[facility]                            CARDIOLOGY OFFICE NOTE   NAME:Achor, LANDYNN DUPLER                     MRN:          [id number]  DATE:04/06/2007                            DOB:          07/19/1925    OFFICE NOTE AND PRESURGICAL CARDIAC CLEARANCE   Mr. Beem is seen for cardiac evaluation.  He has not had proven  coronary disease over the years.  He has had some chest pain in the  past.  He has never had any documented significant ischemia.  His  ejection fraction historically has been normal.  His last Myoview was  done in 2006.  It showed no significant ischemia.  Recently, he has had  several diagnoses made that need further evaluation.  Dr. Darvin Neighbours has  found a lesion on his kidney.  He also has had findings of some type of  masses in his abdomen, and he needs a laparoscopic procedure for biopsy  to be done by Dr. Michaell Cowing next week.  Mr. Yoho continues to be active.  He does not have chest pain.  He has had no syncope or pre-syncope.  His  energy level is not what it used to be.   PAST MEDICAL HISTORY:   ALLERGIES:  No known drug allergies.   MEDICATIONS:  1. Hydrochlorothiazide 25.  2. Norvasc 10.  3. TriCor 145.  4. Singulair.  5. Thyroid.  6. Meclizine p.r.n.   OTHER MEDICAL PROBLEMS:  See the list below.   REVIEW OF SYSTEMS:  At this time, he actually feels well and he has no  significant complaints.   REVIEW OF SYSTEMS:  Negative.   PHYSICAL EXAM:  Blood pressure is 140/78 with a pulse of 58.  The patient is here with his wife.  He is oriented to person, time, and  place and his affect is normal.  He has no xanthelasma.  There is normal extraocular motion.  There is no jugular venous distension.  LUNGS:  Clear.  Respiratory effort is not labored.  CARDIAC:  Reveals an S1 with an S2.  There are no clicks or significant  murmurs.  ABDOMEN:  Soft.  He has no bruits.  There is no peripheral edema.   EKG:  Reveals mild sinus  bradycardia, 1 PVC is noted.  There are no  diagnostic abnormalities.   PROBLEMS:  1. History of some chest discomfort, but no proven coronary disease.      Myoview in 2006 showed no ischemia and a normal ejection fraction.      He has good exercise tolerance and there is no need for further      evaluation.  2. Borderline hypertension.  3. Lipids, treated with TriCor.  4. History of creatinine that was slightly elevated in the past, and      then normalized.  5. History of a systolic click in the past, but no mitral prolapse.  6. Hypothyroidism, treated.  7. History of some nosebleeds in the past.  8. Hoarseness that stabilized in the past.  9. New finding of a mass in his kidney.  10.New finding of masses  in his abdomen, which need to be biopsied.   The patient is stable from a cardiac viewpoint.  He is cleared from the  cardiac viewpoint for his procedure.  We will be sure that Dr. Michaell Cowing  gets this information.     [name], MD, [facility]  Electronically Signed    JDK/MedQ  DD: 04/06/2007  DT: 04/06/2007  Job #: [id number]   cc:   Ardeth Sportsman, MD  Lucrezia Starch Earlene Plater, M.D.  Ladona Horns. Mariel Sleet, MD  Patrica Duel, M.D.

## 2011-02-23 NOTE — Op Note (Signed)
NAMEKEENEN, ROESSNER              ACCOUNT NO.:  [id number]   MEDICAL RECORD NO.:  [id number]          PATIENT TYPE:  INP   LOCATION:  0011                         FACILITY:  [facility]   PHYSICIAN:  Currie Paris, M.D.DATE OF BIRTH:  [birth year]   DATE OF PROCEDURE:  01/29/2008  DATE OF DISCHARGE:                               OPERATIVE REPORT   OFFICE MEDICAL RECORD NUMBER:  [id number].   PREOPERATIVE DIAGNOSIS:  Mesenteric mass, possible lymphoma  lymphadenopathy.   POSTOPERATIVE DIAGNOSIS:  Mesenteric mass, possible lymphoma  lymphadenopathy, possible mesenteric panniculitis.   OPERATION:  Exploratory laparotomy with biopsy x2 of mesenteric mass.   SURGEON:  Currie Paris, M.D.   ASSESSMENT:  Adolph Pollack, M.D.   ANESTHESIA:  General endotracheal.   CLINICAL HISTORY:  This is an 77 year old gentleman that underwent a  laparoscopic biopsy of some mesenteric adenopathy back on July 2 which  showed only some organizing fat necrosis. He has developed some night  sweats since then and continued on a more recent PET scan having very  abnormal uptake. It was suggested that he need a repeat biopsy just to  be sure we are not overlooking lymphoma. The patient was seen by me and  agreed to proceed to surgery.   DESCRIPTION OF PROCEDURE:  The patient was seen in the holding area and  had no further questions. I reviewed his PET scan with the radiologist  just prior to starting to localize where these masses are, and they  appear to be near the base of the mesentery, pretty much about the level  of the umbilicus or just a little above.   The patient was taken to the operating room. After satisfactory general  endotracheal anesthesia had been obtained, the abdomen was clipped,  prepped, and draped with a Foley catheter being placed. I started by  making a short midline incision just below the umbilicus, entering the  midline, and was able to put a wound protector in.  However, all of the  masses of the small bowel appeared to be near the root of the mesentery  and not readily accessible through this incision. In addition, we  noticed that he had chylous fluid present in the peritoneal cavity,  although not a large volume.   I extended the incision superiorly such that I could get a Balfour in.  The small bowel itself appeared to be normal except he had dilated  chylous structures and as noted previously some chylous fluid, The small  bowel was tethered down, and there was very hard masses involving the  root in the very proximal vasculature and extending up a little bit.  Initially, I also noticed that several loops of small bowel were stuck  together possibly from his previous surgery but also probably just  inflammatory changes with his chylous ascites. The hard masses appeared  to surround and follow the mesenteric vessels from the root of the  mesentery up about one fourth of the distance to the bowel.  Eventually,  I was able to get some of this up into the wound such that I could  visualize it, and I found an area that I thought would be safe to biopsy  without interrupting any significant blood supply to the small bowel.   Using the cautery, I scored the peritoneum and then carefully excised a  portion of this mesenteric mass. It was quite hard and really looked  more like dead fat necrosis rather than tumor. I put some sutures in  here to make sure there was no bleeding and left a little Surgicel, and  at the end of the case, we checked that. I continued to look for other  areas and eventually found a small 1.5-cm soft nodular density in the  more peripheral mesentery of the small bowel which I excised readily and  sent this also. The small bowel itself was normal as was the colon to  palpation. His omentum looked a little thickened but no dominant masses.  I could not palpate any other abdominal masses, just these sitting in  the small bowel  mesentery.   I thought it was unsafe to really try to do any further biopsies as I  was concerned we might encountered some very large amounts of bleeding  since we were really at the origin of the mesenteric vessels. We thought  this might represent a  mesenteric panniculits.   The abdomen was closed with a 0 PDS for the posterior sheath below the  umbilicus, then looped 1 PDS for the fascia. The wound was checked and  the skin closed with staples.   The patient tolerated the procedure well, and there were no  complications. All counts were correct. Estimated blood loss was less  than 50 mL.      Currie Paris, M.D.  Electronically Signed     CJS/MEDQ  D:  01/29/2008  T:  01/29/2008  Job:  [id number]   cc:   Patrica Duel, M.D.  Fax: [phone]   Ladona Horns. Mariel Sleet, MD  Fax: [phone]

## 2011-02-23 NOTE — Op Note (Signed)
Brian Mcmillan, Brian Mcmillan              ACCOUNT NO.:  [id number]   MEDICAL RECORD NO.:  [id number]          PATIENT TYPE:  OIB   LOCATION:  5707                         FACILITY:  [facility]   PHYSICIAN:  Ardeth Sportsman, MD     DATE OF BIRTH:  1925-08-21   DATE OF PROCEDURE:  04/12/2007  DATE OF DISCHARGE:                               OPERATIVE REPORT   SURGEON:  Ardeth Sportsman, MD.   ASSISTANTS:  None.   PROCEDURES PERFORMED:  1. Diagnostic laparoscopy.  2. Laparoscopic excisional biopsies of mesenteric fibrosis and lymph      nodes.   UROLOGIST:  Lucrezia Starch. Earlene Plater, MD.   MEDICAL ONCOLOGIST:  Ladona Horns. Mariel Sleet, MD.   CARDIOLOGIST:  [name], MD, [facility].   PREOPERATIVE DIAGNOSES:  1. Jejunal mesenteric masses, probable lymphadenopathy.  2. Left solid renal mass, probable renal cell carcinoma.   POSTOPERATIVE DIAGNOSES:  1. Jejunal mesenteric masses, probable lymphadenopathy.  2. Left solid renal mass, probable renal cell carcinoma.   ANESTHESIA:  1. General anesthesia.  2. Local anesthetic as a field block around all port sites.   SPECIMENS:  Fibrotic, lipomatous and milky mesenteric tissue with some  enlarged lymph nodes.   DRAINS:  None.   ESTIMATED BLOOD LOSS:  100 mL.   COMPLICATIONS:  No major complications.   INDICATIONS:  Mr. Cazier is a 77 year old gentleman, who was found to  have a kidney mass, and workup revealed on CT scan that he had a marked  amount of lymphadenopathy.  He has been followed by a  hematologist/oncologist for some issues of IgM kappa.  He has had  prolonged PTT in the past, and some other abnormalities, although the  bone marrow has been negative.  Workup for the kidney mass has been  concerning for renal cell carcinoma.  PET scan was concerning for some  of the jejunal masses lighting up, probable enlarged lymph nodes.  He  was sent my way by Doctors Earlene Plater and Neijstrom for consideration of  excisional biopsy of these lymph nodes, since  they were not amenable to  percutaneous biopsy.   The technique of diagnostic laparoscopy and possible conversion to open  and excision of enlarged mesenteric masses was discussed.  Risks such as  stroke, heart attack, deep venous thrombosis, pulmonary embolism and  death were discussed.  Risks such as bleeding, need for transfusion,  wound infection, abscess and injury to other organs were discussed.  Risk of incisional hernia was discussed, and prolonged pain.  Risks of  bleeding or need for possible bowel resection, or leak were discussed as  well.  Questions were answered.  He and his family agreed to proceed.  I  again discussed this in detail with the patient just prior to surgery,  and I again reiterated it with the family after surgery as well.   OPERATIVE FINDINGS:  Most of his abdomen was normal, including his  peritoneum, liver, gallbladder, colon, stomach and his proximal jejunum  and his distal ileum.   In the midjejunum, he had areas of mesentery swelling, almost sort of a  milky like  patching of his mesentery, with swelling of the jejunal  mesentery.  There were a couple areas of the jejunal serosa that had a  little bit of this coloration as well, although there was no evidence of  any small bowel mass or perforation or ulceration or stricture.  His  jejunal mesentery was somewhat shrunken down and thickened, with  somewhat of a fibrosed consistency.  Enlarged areas that seemed to  correlate on the CT scan seemed more consistent with fibrosis than  actual lymph nodes, although I was able to get 1 normal-size and 1  enlarged lymph node during the process of dissection.  His omentum  appeared to be normal with no caking.   It should be noted that the mesentery did not have the definite  classic  stellate, punched-out lesion that is more classic for carcinoid, and no  definite small bowel concerning for carcinoid either.   DESCRIPTION OF PROCEDURES:  Informed consent  was confirmed.  The patient  received IV antibiotics.  He was positioned supine with both arms  tucked.  He underwent general anesthesia without any difficulty.  He had  a Foley catheter sterilely placed.  His abdomen was copiously prepped  and draped in the usual sterile fashion.   Entry was gain into the abdomen with the patient in steep reverse  Trendelenburg and left side up, using a 5-mm/0-degree scope with optical  entry.  Capnoperitoneum to 15 mmHg provided good abdominal insufflation.  Under direct visualization, a 10-mm port was placed infraumbilically.  A  5-mm port was placed in the right midabdomen, and later a 5-mm port was  placed in the left lower quadrant.   Visual inspection revealed the findings noted above.  The small bowel  was run from the terminal ileum all the way to the ligament of Treitz.  The midjejunum had a thickened, stiff mesentery.   After running the small bowel mesentery, I could see some enlarged,  milky white swellings within the mesentery itself.  At about 3 areas, I  used the Harmonic scalpel to help try to enucleate these areas, but they  actually seemed more consistent with fibrotic fat, and only 1 of them  really had an enlarged lymph node within it.   I reviewed the CT scan in the OR and went a little more proximally  within the mesentery, where the larger masses seemed to be completed.  I  was ultimately able to enucleate 2 more fibrotic areas, although in the  last place in the midjejunum in a more fibrotic area, I did run into  some mesenteric bleeding that was ultimately controlled with the help of  clips, the Harmonic and Surgicel and pressure.   The small bowel was rerun and all the small bowel had no ischemia, and  the mesentery was not completely breached.  There was no evidence of any  active bleeding.  Irrigation with over 1 L was done with clear return at  the end.  The omentum, which had been reflected cephalad, was allowed to   fall back over all the structures.  The capnoperitoneum was completely  evacuated.  The ports were removed.  The fascia at the infraumbilical  port site was closed using 0 Vicryl in a figure-of-eight fashion.  The  skin was closed using a 4-0 Monocryl stitch.  A sterile dressing was  applied.  The patient was extubated and sent to the recovery room in  stable condition.   I explained the operative findings to  the patient's family and I tried  to discuss postoperative scenarios in details.  Pathology had been  notified, and per their request, specimen was sent fresh, with the  pathologist on call aware of our collection, since it was a later case.      Ardeth Sportsman, MD  Electronically Signed     SCG/MEDQ  D:  04/12/2007  T:  04/13/2007  Job:  [id number]   cc:   Patrica Duel, M.D.  Ronald L. Earlene Plater, M.D.  Ladona Horns. Mariel Sleet, MD  [name], MD, [facility]

## 2011-02-23 NOTE — Assessment & Plan Note (Signed)
[facility]                            CARDIOLOGY OFFICE NOTE   NAME:Braver, JIN CAPOTE                     MRN:          [id number]  DATE:06/07/2008                            DOB:          1924-10-26    Mr. Stejskal is actually doing very well.  I saw him last in June 2008.  At  that time, we cleared him to have a procedure related to his kidney.  This was done by Dr. Michaell Cowing.  Ultimately, he was seen at [facility] in  addition and had tumor on his kidney, radiofrequency ablated and he did  well.  After that he had mesenteric mass.  It was felt that he had  cancer in this area based on the PET scan.  Fortunately, it turned out  to be a chronic inflammatory mass of the mesentery.  This was removed.  He did well.  He is quite active and having no particular problems.  He  is not having any chest pain.   PAST MEDICAL HISTORY:   ALLERGIES:  No known drug allergies.   MEDICATIONS:  1. Hydrochlorothiazide.  2. Norvasc.  3. Tricor 145.  4. Thyroid.   OTHER MEDICATION PROBLEMS:  See the list on my note of April 06, 2007.   REVIEW OF SYSTEMS:  His review of systems is negative.   PHYSICAL EXAMINATION:  VITAL SIGNS:  Blood pressure 137/64 with a pulse  of 59.  Weight is 203 pounds which is stable for him.  GENERAL:  The patient is oriented to person, time and place.  He is here  with his wife of 55 years.  The patient looks quite good.  He is tanned  and he has been playing golf.  HEENT:  Reveals no xanthelasma.  He has normal extraocular motion.  There are no carotid bruits.  There is no jugular venous distention.  LUNGS:  Clear.  RESPIRATORY:  Effort is not labored.  CARDIAC:  Reveals S1 and S2.  There are no clicks or significant  murmurs.  ABDOMEN:  Soft.  His abdomen is mildly protuberant and he has a scar  from his surgery for his mesenteric masses.  He has no peripheral edema.   EKG reveals no diagnostic change.  There are scattered PVCs.   Problems are listed on the note of April 06, 2007.  #1.  History of some chest discomfort.  There is never been proven  coronary disease.  Myoview in 2006 showed no ischemia and in normal  ejection fraction.  #3.  Lipid abnormality treated with Tricor.  #9.  Status post radiofrequency ablation of a mass on his kidney.  #10.  Masses in the abdomen that turned out to be inflammatory  mesenteric abnormalities that were treated, and he is quite stable.   He needs no further cardiac workup.  I will see him back in 1 year.     [name], MD, [facility]  Electronically Signed    JDK/MedQ  DD: 06/07/2008  DT: 06/08/2008  Job #: [id number]   cc:   Patrica Duel, M.D.

## 2011-02-26 NOTE — H&P (Signed)
NAME:  Brian Mcmillan, Brian Mcmillan                        ACCOUNT NO.:  [id number]   MEDICAL RECORD NO.:  [id number]                   PATIENT TYPE:  INP   LOCATION:  A329                                 FACILITY:  [facility]   PHYSICIAN:  Hanley Hays. Dechurch, M.D.           DATE OF BIRTH:  Mar 21, 1925   DATE OF ADMISSION:  10/04/2002  DATE OF DISCHARGE:                                HISTORY & PHYSICAL   HISTORY OF PRESENT ILLNESS:  A 77 year old Caucasian male followed by Dr.  Nobie Putnam with a past medical history of hypertension, hyperlipidemia, and  inner ear noted to be stumbling per his wife over the past 2-3 days.  He has  a history of intermittent vertigo of longstanding, usually taking Antivert.  Tonight after church, the patient had sudden onset of vertigo, nausea,  vomiting with dry heaves, and presented to the emergency room via EMS.  The  patient has had no diarrhea.  He ate raw oysters this evening and was  concerned he had food poisoning but the history is not consistent with such.  He has had no chest pain or shortness of breath.  No headache.  No recent  URI's or sinus symptoms.  No history of head trauma.  No hearing changes.  No changes in mental status.  Denies any GI or GU complaints otherwise.  Positive anxiety.   REVIEW OF SYSTEMS:  Otherwise unremarkable.   MEDICATIONS:  1. Hydrochlorothiazide 25 mg q.d.  2. Synthroid 112 mcg daily.  3. Norvasc 5 mg daily.  4. Lopid 600 b.i.d.  5. Antivert p.r.n.   ALLERGIES:  None known.   SOCIAL HISTORY:  He is married.  Children are supportive.  No alcohol or  tobacco abuse.   FAMILY HISTORY:  Noncontributory to current problem.   PHYSICAL EXAMINATION:  GENERAL:  Slightly obese elderly gentleman who  appears his stated age.  The patient is somewhat anxious.  He is alert and  appropriate.  VITAL SIGNS:  Blood pressure 150/70, pulse 82 and regular, respirations are  unlabored.  NECK:  No adenopathy or bruits.  The carotids are  full.  TM's are not  visualized.  Pupils are equal, round, and reactive to light.  He has obvious  nystagmus, particularly when he turns his head to the right, and I can  reproduce his symptoms.  He is not very cooperative with this exam.  HEART:  Regular.  There is no murmur, gallop, or rub.  ABDOMEN:  Obese, soft, nontender except for right periumbilically.  This  appears to be abdominal wall, probably related to musculoskeletal  tenderness.  EXTREMITIES:  Without clubbing, cyanosis, or edema.  Gait is not tested.  Moving extremities x4.  Strength is good.  NEUROLOGIC:  Mental status is intact.   LABORATORY DATA:  Potassium is 3.2, lipase is 70.   ASSESSMENT AND PLAN:  1. Acute vertigo, suspect labyrinthitis, unlikely to be ischemic event.  Continue with aspirin and symptomatic therapy.  Discussed with the     patient and his wife if it persists, consider an MRI although options are     limited for any kind of further treatment except for increasing     antiplatelet therapy.  2. Mild increase of lipase, check followup.  His amylase is normal, making     pathology unlikely.  3. Decreased potassium.  Intravenous supplement check follow up.  4. Hypertension, reasonable control.  No history to suggest orthostasis.  We     will continue to monitor during his brief hospital stay.   Plan of care issues and options discussed with the patient and his wife.                                               Hanley Hays Josefine Class, M.D.    FED/MEDQ  D:  10/04/2002  T:  10/05/2002  Job:  [id number]

## 2011-02-26 NOTE — Procedures (Signed)
[facility]  Patient:    Brian Mcmillan, Brian Mcmillan Visit Number: [id number] MRN: [id number]          Service Type: OUT Location: [facility] Attending Physician:  Kirk Ruths Dictated by:   Kari Baars, M.D. Proc. Date: 12/27/01 Admit Date:  12/05/2001 Discharge Date: 12/05/2001                      Pulmonary Function Test Inter.  RESULTS: 1. Spirometry shows mild ventilatory defect with air flow obstruction seen at    the level of the smaller airways. 2. Lung volumes show minimal restrictive change. 3. DLCO mildly to moderately reduced. 4. Arterial blood gases show normal oxygenation and acute hyperventilation. Dictated by:   Kari Baars, M.D. Attending Physician:  Kirk Ruths DD:  12/27/01 TD:  12/29/01 Job: [id number]

## 2011-02-26 NOTE — Discharge Summary (Signed)
   NAME:  Brian Mcmillan, Brian Mcmillan                        ACCOUNT NO.:  [id number]   MEDICAL RECORD NO.:  [id number]                   PATIENT TYPE:  INP   LOCATION:  A329                                 FACILITY:  [facility]   PHYSICIAN:  Madelin Rear. Sherwood Gambler, M.D.             DATE OF BIRTH:  1925-09-18   DATE OF ADMISSION:  10/04/2002  DATE OF DISCHARGE:  10/05/2002                                 DISCHARGE SUMMARY   DISCHARGE DIAGNOSES:  1. Hypertension.  2. Hypothyroidism.  3. Positional vertigo.  4. Hyperlipidemia.   DISCHARGE MEDICATIONS:  1. Afrin nasal spray, p.r.n.  2. Antivert 25 mg p.o. q.i.d. p.r.n.  3. Hydrochlorothiazide 25 mg p.o. daily.  4. Synthroid 112 mcg daily.  5. Norvasc 5 mg daily.  6. Lopid 600 b.i.d.   SUMMARY/HOSPITAL COURSE:  The patient was admitted with inner ear and ataxic  gait.  He had no traumatic history, just severe vertigo, resulting in  admission for IV treatment of symptoms.  He responded well to IV maintenance  and medications.   DISCHARGE INSTRUCTIONS:  He was discharged in good condition, asymptomatic,  and follow up in office.                                               Madelin Rear. Sherwood Gambler, M.D.    LJF/MEDQ  D:  11/04/2002  T:  11/04/2002  Job:  [id number]

## 2011-03-16 ENCOUNTER — Telehealth: Payer: Self-pay | Admitting: Cardiology

## 2011-03-16 NOTE — Telephone Encounter (Signed)
I talked with pt. He saw Dr Phillips Odor in [place] yesterday. Pt having increasing SOB and chest pain. Pt's children requested Dr Phillips Odor refer pt to Dr Regino Schultze at [facility] for further evaluation. Pt states he  is going to be admitted through the ER at [facility] today. Pt wants to be sure Dr Myrtis Ser is OK with this. I told pt I thought this would be OK with Dr Myrtis Ser and we can forward records to [facility].

## 2011-03-16 NOTE — Telephone Encounter (Signed)
Pt calling re chest pain/burning since yesterday saw pcp who suggested he see Myrtis Ser, his kids however want him to go to [facility] and have arranged this, pt concerned about going without dr Henrietta Hoover ok

## 2011-03-19 NOTE — Telephone Encounter (Signed)
OK 

## 2011-03-24 ENCOUNTER — Encounter (HOSPITAL_COMMUNITY): Payer: Self-pay | Admitting: *Deleted

## 2011-03-29 ENCOUNTER — Other Ambulatory Visit (HOSPITAL_COMMUNITY): Payer: Self-pay | Admitting: Oncology

## 2011-03-29 DIAGNOSIS — D472 Monoclonal gammopathy: Secondary | ICD-10-CM

## 2011-03-29 DIAGNOSIS — C649 Malignant neoplasm of unspecified kidney, except renal pelvis: Secondary | ICD-10-CM | POA: Insufficient documentation

## 2011-04-09 ENCOUNTER — Ambulatory Visit: Payer: Medicare Other | Admitting: Cardiology

## 2011-04-22 ENCOUNTER — Other Ambulatory Visit (HOSPITAL_COMMUNITY): Payer: Medicare Other

## 2011-04-28 ENCOUNTER — Ambulatory Visit (HOSPITAL_COMMUNITY): Payer: Self-pay | Admitting: Oncology

## 2011-05-25 ENCOUNTER — Encounter (HOSPITAL_COMMUNITY): Payer: Medicare Other | Attending: Oncology | Admitting: Oncology

## 2011-05-25 VITALS — BP 136/68 | HR 50 | Temp 97.4°F | Wt 217.2 lb

## 2011-05-25 DIAGNOSIS — D472 Monoclonal gammopathy: Secondary | ICD-10-CM

## 2011-05-25 DIAGNOSIS — N289 Disorder of kidney and ureter, unspecified: Secondary | ICD-10-CM

## 2011-05-25 NOTE — Progress Notes (Signed)
This office note has been dictated.

## 2011-05-25 NOTE — Patient Instructions (Signed)
[facility]  Discharge Instructions  RECOMMENDATIONS MADE BY THE CONSULTANT AND ANY TEST RESULTS WILL BE SENT TO YOUR REFERRING DOCTOR.   EXAM FINDINGS BY MD TODAY AND SIGNS AND SYMPTOMS TO REPORT TO CLINIC OR PRIMARY MD: We will make appt. With Dr. Eduard Clos for pain management for your back.  INSTRUCTIONS GIVEN AND DISCUSSED:No More dye with your x-rays! Labs today   SPECIAL INSTRUCTIONS/FOLLOW-UP: Return to Clinic on 6 months   I acknowledge that I have been informed and understand all the instructions given to me and received a copy. I do not have any more questions at this time, but understand that I may call the Specialty Clinic at [facility] at [phone] during business hours should I have any further questions or need assistance in obtaining follow-up care.    __________________________________________  _____________  __________ Signature of Patient or Authorized Representative            Date                   Time    __________________________________________ Nurse's Signature

## 2011-05-26 LAB — KAPPA/LAMBDA LIGHT CHAINS: Kappa, lambda light chain ratio: 6.05 — ABNORMAL HIGH (ref 0.26–1.65)

## 2011-05-27 LAB — MULTIPLE MYELOMA PANEL, SERUM
Albumin ELP: 49.2 % — ABNORMAL LOW (ref 55.8–66.1)
Alpha-1-Globulin: 3.7 % (ref 2.9–4.9)
Beta 2: 4.5 % (ref 3.2–6.5)
IgA: 100 mg/dL (ref 68–379)
IgG (Immunoglobin G), Serum: 1920 mg/dL — ABNORMAL HIGH (ref 650–1600)
IgM, Serum: 404 mg/dL — ABNORMAL HIGH (ref 41–251)
Total Protein: 5.2 g/dL — ABNORMAL LOW (ref 6.0–8.3)

## 2011-06-15 ENCOUNTER — Ambulatory Visit (HOSPITAL_COMMUNITY): Payer: Medicare Other | Admitting: Oncology

## 2011-07-02 LAB — PROTIME-INR: Prothrombin Time: 14.2

## 2011-07-02 LAB — PROTEIN ELECTROPHORESIS, SERUM
Alpha-1-Globulin: 3.6
Alpha-2-Globulin: 11.9 — ABNORMAL HIGH
Beta 2: 3.3
Beta Globulin: 5.8
Gamma Globulin: 21.7 — ABNORMAL HIGH

## 2011-07-02 LAB — CBC
HCT: 42.9
Hemoglobin: 15
MCV: 82.3
RDW: 15.4

## 2011-07-02 LAB — COMPREHENSIVE METABOLIC PANEL
Alkaline Phosphatase: 37 — ABNORMAL LOW
BUN: 26 — ABNORMAL HIGH
Creatinine, Ser: 1.58 — ABNORMAL HIGH
Glucose, Bld: 97
Potassium: 3.3 — ABNORMAL LOW
Total Bilirubin: 0.8
Total Protein: 7.9

## 2011-07-02 LAB — DIFFERENTIAL
Basophils Absolute: 0
Basophils Relative: 0
Lymphocytes Relative: 25
Monocytes Relative: 9
Neutro Abs: 5.6
Neutrophils Relative %: 65

## 2011-07-02 LAB — LACTATE DEHYDROGENASE: LDH: 120

## 2011-07-05 LAB — CBC
HCT: 39.8
Hemoglobin: 13.8
Platelets: 244
RBC: 4.77
WBC: 4.6

## 2011-07-05 LAB — DIFFERENTIAL
Eosinophils Relative: 1
Lymphocytes Relative: 33
Lymphs Abs: 1.5
Monocytes Absolute: 0.7

## 2011-07-05 LAB — BONE MARROW EXAM

## 2011-07-06 LAB — BASIC METABOLIC PANEL
Calcium: 8.9
GFR calc non Af Amer: 46 — ABNORMAL LOW
Potassium: 4.5
Sodium: 137

## 2011-07-06 LAB — CBC
HCT: 40.6
Hemoglobin: 14
MCHC: 34
Platelets: 235
RBC: 4.19 — ABNORMAL LOW
RDW: 14.9
RDW: 15.3

## 2011-07-06 LAB — DIFFERENTIAL
Basophils Absolute: 0
Basophils Relative: 0
Neutro Abs: 2.2
Neutrophils Relative %: 48

## 2011-07-06 LAB — COMPREHENSIVE METABOLIC PANEL
Alkaline Phosphatase: 34 — ABNORMAL LOW
BUN: 13
CO2: 29
Chloride: 107
GFR calc non Af Amer: 46 — ABNORMAL LOW
Glucose, Bld: 89
Potassium: 3.9
Total Bilirubin: 0.9
Total Protein: 8

## 2011-07-06 LAB — URINALYSIS, ROUTINE W REFLEX MICROSCOPIC
Glucose, UA: NEGATIVE
Ketones, ur: NEGATIVE
pH: 6.5

## 2011-07-18 ENCOUNTER — Other Ambulatory Visit: Payer: Self-pay | Admitting: Cardiology

## 2011-07-19 ENCOUNTER — Other Ambulatory Visit: Payer: Self-pay

## 2011-07-19 MED ORDER — LOSARTAN POTASSIUM 100 MG PO TABS: 100.0000 mg | ORAL_TABLET | Freq: Every day | ORAL | Status: DC

## 2011-07-23 NOTE — Progress Notes (Signed)
CC:   [facility] Brian Mcmillan, Dept. of Urology Brian Mcmillan, M.D. Brian Abed, MD, [facility] Dr. Rolly Mcmillan, Dept. of Cardiology  Brian Mcmillan is here today to really have a consultation, just a verbal consultation about what has happened to him lately.  In March of this year he had an MRI of his abdomen looking at his kidneys, and it was with and without contrast.  Since that time his creatinine has gone up to the 2.2-2.4 range.  Prior to that it was in the 1.41-1.57 range from the labs that I have available.  He has also developed the last week a half or so back pain again and, of course, he has degenerative disk disease, arthritic disease and probably some spinal stenosis of the low back, well-documented in the past on an MRI without contrast.  He has had epidural steroid injections, some with great success and some with minimal success.  He would like [place] to set up a consult with physiatrist, Dr. Nickola Mcmillan.  We are happy to do that for him.  He has lower back muscle spasticity in the paralumbar area without question today on exam.  He also has about 1+ to 2+ pitting edema of his lower extremities today. His weight is up another few pounds, to this time 217 pounds.  When he was here last in January, his weight was 214 pounds and a year ago was 211 pounds.  He is very worried about couple things. 1. His kidney function and the fact that he has been told that he     might go on dialysis. 2. The back pain.  He was told it was from his "kidney disease." 3. The last seen he is worried about is that he may have had cancer     and that no one was really telling him about it at [facility].  I have     reassured him that Dr. Burnett Mcmillan if he thought he had cancer would     absolutely have told him and that I would tell him the same as     well, but that is not the issue at hand, I think.  I think the issue at hand is his declining renal function and to that end, he is going  to have another opinion with a nephrologist, this time at [facility].  He saw one last year at [facility], actually.  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  He has also had, of course the diagnosis of left renal cell carcinoma, status post RFA ablation, but the MRI at [facility] in March was negative for any evidence of recurrence.  His other diagnosis of mesenteric adenopathy with organizing fat necrosis with biopsy on 04/12/2007 by Dr. Karie Mcmillan and then a repeat on 01/29/2008 by Dr. Jamey Mcmillan showed the exact same diagnoses with no evidence for lymphoma, etc.  His prolonged PTT in the past was not reconfirmed by recent labs at [facility] and that is encouraging, but they have been intermittently normal in the past.  He is also seen an ear, nose and throat doctor about his nosebleeds and it sounds like he did not find anything specific.  I wonder have wondered whether his monoclonal proteins might be an inhibitor of his PTTs at times.  His coronary artery disease has been reevaluate after by Dr. Burnett Mcmillan Recently at [facility], and medical therapy is what has been  recommended.  He, of course, has had an intermittently elevated sugar in the past, typically controlled pretty well with diet, but I think his weight is becoming an issue and I think what the nephrologist will focus on at [facility] it is maintaining his weight, maintaining his blood pressure by reducing his weight, I should say, to a more ideal weight, maintaining his blood pressure in the normal range, making sure he is not leaning towards overt diabetes, etc.  I think with his age, which is now 31, and his multiple medical problems including some loss of kidney function from his RFA ablation, that he has medical renal disease only at this juncture that needs just followup and close attention rather than dialysis.  So I think he was reassured when left here.  We send some of  my notes with him for him to take to the doctor at [facility], and I will go from there.  I will see him in 6 months regardless.    ______________________________ Brian Mcmillan. Brian Sleet, MD ESN/MEDQ  D:  05/25/2011  T:  05/25/2011  Job:  [id number]

## 2011-07-27 LAB — CBC
HCT: 33.6 — ABNORMAL LOW
Hemoglobin: 11.4 — ABNORMAL LOW
MCV: 83
RBC: 4.05 — ABNORMAL LOW
RBC: 4.54
WBC: 10.4
WBC: 5.4

## 2011-07-28 LAB — PROTIME-INR
INR: 1.1
Prothrombin Time: 14.5

## 2011-07-28 LAB — DIFFERENTIAL
Basophils Relative: 0
Eosinophils Absolute: 0
Eosinophils Relative: 0
Monocytes Relative: 14 — ABNORMAL HIGH
Neutrophils Relative %: 41 — ABNORMAL LOW

## 2011-07-28 LAB — CBC
HCT: 41.3
MCHC: 33.9
MCV: 82.9
Platelets: 283

## 2011-07-28 LAB — HEPATIC FUNCTION PANEL
Albumin: 4.3
Alkaline Phosphatase: 37 — ABNORMAL LOW
Indirect Bilirubin: 0.5
Total Bilirubin: 0.7
Total Protein: 8.3

## 2011-07-28 LAB — BASIC METABOLIC PANEL
BUN: 17
CO2: 30
Chloride: 103
Creatinine, Ser: 1.54 — ABNORMAL HIGH
Potassium: 3.8

## 2011-07-29 LAB — CREATININE, SERUM: Creatinine, Ser: 1.48

## 2011-08-19 ENCOUNTER — Other Ambulatory Visit: Payer: Self-pay | Admitting: *Deleted

## 2011-08-19 MED ORDER — NITROGLYCERIN 0.4 MG SL SUBL: 0.4000 mg | SUBLINGUAL_TABLET | SUBLINGUAL | Status: DC | PRN

## 2011-11-23 ENCOUNTER — Encounter (HOSPITAL_COMMUNITY): Payer: Medicare Other | Attending: Oncology

## 2011-11-23 DIAGNOSIS — N289 Disorder of kidney and ureter, unspecified: Secondary | ICD-10-CM | POA: Diagnosis not present

## 2011-11-23 DIAGNOSIS — M51379 Other intervertebral disc degeneration, lumbosacral region without mention of lumbar back pain or lower extremity pain: Secondary | ICD-10-CM | POA: Insufficient documentation

## 2011-11-23 DIAGNOSIS — M1990 Unspecified osteoarthritis, unspecified site: Secondary | ICD-10-CM | POA: Insufficient documentation

## 2011-11-23 DIAGNOSIS — M5137 Other intervertebral disc degeneration, lumbosacral region: Secondary | ICD-10-CM | POA: Insufficient documentation

## 2011-11-23 DIAGNOSIS — D472 Monoclonal gammopathy: Secondary | ICD-10-CM | POA: Insufficient documentation

## 2011-11-23 LAB — DIFFERENTIAL
Basophils Absolute: 0 10*3/uL (ref 0.0–0.1)
Eosinophils Relative: 1 % (ref 0–5)
Lymphocytes Relative: 39 % (ref 12–46)
Lymphs Abs: 1.6 10*3/uL (ref 0.7–4.0)
Neutro Abs: 1.9 10*3/uL (ref 1.7–7.7)
Neutrophils Relative %: 45 % (ref 43–77)

## 2011-11-23 LAB — COMPREHENSIVE METABOLIC PANEL
BUN: 18 mg/dL (ref 6–23)
CO2: 26 mEq/L (ref 19–32)
Calcium: 10.1 mg/dL (ref 8.4–10.5)
Creatinine, Ser: 1.24 mg/dL (ref 0.50–1.35)
GFR calc Af Amer: 60 mL/min — ABNORMAL LOW (ref >90)
GFR calc non Af Amer: 52 mL/min — ABNORMAL LOW (ref >90)
Glucose, Bld: 110 mg/dL — ABNORMAL HIGH (ref 70–99)
Sodium: 140 mEq/L (ref 135–145)
Total Protein: 7.8 g/dL (ref 6.0–8.3)

## 2011-11-23 LAB — CBC
Hemoglobin: 12.8 g/dL — ABNORMAL LOW (ref 13.0–17.0)
MCH: 28.7 pg (ref 26.0–34.0)
MCHC: 33.5 g/dL (ref 30.0–36.0)
MCV: 85.7 fL (ref 78.0–100.0)
RBC: 4.46 MIL/uL (ref 4.22–5.81)

## 2011-11-23 NOTE — Progress Notes (Signed)
Labs drawn today for cbc/diff,cmp,mm panel, Kllc

## 2011-11-24 ENCOUNTER — Encounter (HOSPITAL_BASED_OUTPATIENT_CLINIC_OR_DEPARTMENT_OTHER): Payer: Medicare Other | Admitting: Oncology

## 2011-11-24 ENCOUNTER — Encounter (HOSPITAL_COMMUNITY): Payer: Self-pay | Admitting: Oncology

## 2011-11-24 VITALS — BP 132/70 | HR 60 | Temp 98.6°F | Ht 74.0 in | Wt 201.7 lb

## 2011-11-24 DIAGNOSIS — R599 Enlarged lymph nodes, unspecified: Secondary | ICD-10-CM | POA: Diagnosis not present

## 2011-11-24 DIAGNOSIS — N289 Disorder of kidney and ureter, unspecified: Secondary | ICD-10-CM

## 2011-11-24 DIAGNOSIS — D472 Monoclonal gammopathy: Secondary | ICD-10-CM

## 2011-11-24 DIAGNOSIS — C649 Malignant neoplasm of unspecified kidney, except renal pelvis: Secondary | ICD-10-CM | POA: Diagnosis not present

## 2011-11-24 LAB — KAPPA/LAMBDA LIGHT CHAINS
Kappa free light chain: 22.7 mg/dL — ABNORMAL HIGH (ref 0.33–1.94)
Lambda free light chains: 1.39 mg/dL (ref 0.57–2.63)

## 2011-11-24 NOTE — Progress Notes (Signed)
This office note has been dictated.

## 2011-11-24 NOTE — Patient Instructions (Signed)
Brian Mcmillan  [id number] Jun 07, 1925   [facility]  Discharge Instructions  RECOMMENDATIONS MADE BY THE CONSULTANT AND ANY TEST RESULTS WILL BE SENT TO YOUR REFERRING DOCTOR.   EXAM FINDINGS BY MD TODAY AND SIGNS AND SYMPTOMS TO REPORT TO CLINIC OR PRIMARY MD: Your labs are great.  We don't need to do anything right now.  Will see you back in 6 months.  MEDICATIONS PRESCRIBED: none   INSTRUCTIONS GIVEN AND DISCUSSED: Other :  Report sweats, recurring infections, etc.  SPECIAL INSTRUCTIONS/FOLLOW-UP: Lab work Needed in August and Return to Clinic to see Dr Mariel Sleet a few days later.   I acknowledge that I have been informed and understand all the instructions given to me and received a copy. I do not have any more questions at this time, but understand that I may call the Specialty Clinic at [facility] at [phone] during business hours should I have any further questions or need assistance in obtaining follow-up care.    __________________________________________  _____________  __________ Signature of Patient or Authorized Representative            Date                   Time    __________________________________________ Nurse's Signature

## 2011-11-24 NOTE — Progress Notes (Signed)
CC:   Brian Mcmillan, M.D. Dr. Georgena Spurling. Brian Mcmillan, M.D.  DIAGNOSES: 1. Monoclonal gammopathy of unknown significance x2, stable without     change. 2. 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 Karie Soda,  and a repeat     biopsy on 01/29/2008 by Dr. Cicero Duck still showing the same     diagnoses with no evidence for lymphoma. 3. Coronary artery disease, and he has seeing Dr. Burnett Sheng presently. 4. Intermittent elevation of his sugar in the past. 5. Mild renal insufficiency. 6. Renal cell carcinoma, status post RFA ablation with mild increase     in his creatinine from that as well as more recently MRI contrast     utilization bumping his creatinine to the 2.2-2.4 range.  His     creatinine is now 1.24. Brian Mcmillan is here today doing very, very well.  He just does not have the stamina to play golf anymore, he states.  He can play 4-6 holes, rarely 9 but he is usually exhausted after this, he states.  So I have asked him to discuss this once again with Dr. Burnett Sheng. He also had some cauterization of a nasal area that would bleed once in a while but was felt to be benign.  His blood counts most recently show hemoglobin 12.8 g, white count 4100, platelets 187,000.  Differential really unremarkable.  Sugar is 100.  He still, of course, has 2 monoclonal spikes most recently evaluated in August.  Those labs are still pending from the other day, namely yesterday but they were stable in August from previous testing.  Again, his creatinine is 1.24.  His electrolytes are fine.  Liver enzymes are normal.  Albumin is normal at 3.6.  So, he looks good today.  I do not think there is anything for me to do right now other than observe him and will wait for the protein electrophoresis to come back and I will repeat things in 6 months and see him then.    ______________________________ Ladona Horns. Mariel Sleet, MD ESN/MEDQ  D:   11/24/2011  T:  11/24/2011  Job:  [id number]

## 2011-11-25 LAB — MULTIPLE MYELOMA PANEL, SERUM
Albumin ELP: 50.2 % — ABNORMAL LOW (ref 55.8–66.1)
Beta Globulin: 5.1 % (ref 4.7–7.2)
IgA: 87 mg/dL (ref 68–379)
IgM, Serum: 564 mg/dL — ABNORMAL HIGH (ref 41–251)
M-Spike, %: 0.51 g/dL
Total Protein: 7.8 g/dL (ref 6.0–8.3)

## 2011-11-26 ENCOUNTER — Telehealth (HOSPITAL_COMMUNITY): Payer: Self-pay | Admitting: *Deleted

## 2011-11-26 NOTE — Telephone Encounter (Signed)
Message copied by Dennie Maizes on Fri Nov 26, 2011 10:10 AM ------      Message from: Mariel Sleet, ERIC S      Created: Fri Nov 26, 2011  9:57 AM       Stable.

## 2011-11-26 NOTE — Telephone Encounter (Signed)
Spoke with pt's wife as below. Instruct pt may call clinic if he has any questions.

## 2011-12-20 ENCOUNTER — Encounter: Payer: Self-pay | Admitting: Oncology

## 2012-01-05 DIAGNOSIS — D72819 Decreased white blood cell count, unspecified: Secondary | ICD-10-CM | POA: Diagnosis not present

## 2012-01-05 DIAGNOSIS — Z862 Personal history of diseases of the blood and blood-forming organs and certain disorders involving the immune mechanism: Secondary | ICD-10-CM | POA: Diagnosis not present

## 2012-01-05 DIAGNOSIS — R040 Epistaxis: Secondary | ICD-10-CM | POA: Diagnosis not present

## 2012-01-05 DIAGNOSIS — R599 Enlarged lymph nodes, unspecified: Secondary | ICD-10-CM | POA: Diagnosis not present

## 2012-01-05 DIAGNOSIS — E785 Hyperlipidemia, unspecified: Secondary | ICD-10-CM | POA: Diagnosis not present

## 2012-01-05 DIAGNOSIS — N289 Disorder of kidney and ureter, unspecified: Secondary | ICD-10-CM | POA: Diagnosis not present

## 2012-01-05 DIAGNOSIS — R0989 Other specified symptoms and signs involving the circulatory and respiratory systems: Secondary | ICD-10-CM | POA: Diagnosis not present

## 2012-01-05 DIAGNOSIS — I10 Essential (primary) hypertension: Secondary | ICD-10-CM | POA: Diagnosis not present

## 2012-01-05 DIAGNOSIS — I2510 Atherosclerotic heart disease of native coronary artery without angina pectoris: Secondary | ICD-10-CM | POA: Diagnosis not present

## 2012-01-05 DIAGNOSIS — R0609 Other forms of dyspnea: Secondary | ICD-10-CM | POA: Diagnosis not present

## 2012-01-05 DIAGNOSIS — Z8719 Personal history of other diseases of the digestive system: Secondary | ICD-10-CM | POA: Diagnosis not present

## 2012-02-24 DIAGNOSIS — N189 Chronic kidney disease, unspecified: Secondary | ICD-10-CM | POA: Diagnosis not present

## 2012-02-24 DIAGNOSIS — R319 Hematuria, unspecified: Secondary | ICD-10-CM | POA: Diagnosis not present

## 2012-02-24 DIAGNOSIS — C649 Malignant neoplasm of unspecified kidney, except renal pelvis: Secondary | ICD-10-CM | POA: Diagnosis not present

## 2012-03-16 DIAGNOSIS — E039 Hypothyroidism, unspecified: Secondary | ICD-10-CM | POA: Diagnosis not present

## 2012-03-23 DIAGNOSIS — E039 Hypothyroidism, unspecified: Secondary | ICD-10-CM | POA: Diagnosis not present

## 2012-04-04 DIAGNOSIS — Z6825 Body mass index (BMI) 25.0-25.9, adult: Secondary | ICD-10-CM | POA: Diagnosis not present

## 2012-04-04 DIAGNOSIS — M549 Dorsalgia, unspecified: Secondary | ICD-10-CM | POA: Diagnosis not present

## 2012-04-04 DIAGNOSIS — E119 Type 2 diabetes mellitus without complications: Secondary | ICD-10-CM | POA: Diagnosis not present

## 2012-04-10 DIAGNOSIS — E785 Hyperlipidemia, unspecified: Secondary | ICD-10-CM | POA: Diagnosis not present

## 2012-04-10 DIAGNOSIS — N289 Disorder of kidney and ureter, unspecified: Secondary | ICD-10-CM | POA: Diagnosis not present

## 2012-04-10 DIAGNOSIS — I10 Essential (primary) hypertension: Secondary | ICD-10-CM | POA: Diagnosis not present

## 2012-04-10 DIAGNOSIS — Z862 Personal history of diseases of the blood and blood-forming organs and certain disorders involving the immune mechanism: Secondary | ICD-10-CM | POA: Diagnosis not present

## 2012-04-10 DIAGNOSIS — D472 Monoclonal gammopathy: Secondary | ICD-10-CM | POA: Diagnosis not present

## 2012-04-10 DIAGNOSIS — E039 Hypothyroidism, unspecified: Secondary | ICD-10-CM | POA: Diagnosis not present

## 2012-04-10 DIAGNOSIS — J449 Chronic obstructive pulmonary disease, unspecified: Secondary | ICD-10-CM | POA: Diagnosis not present

## 2012-04-10 DIAGNOSIS — R0602 Shortness of breath: Secondary | ICD-10-CM | POA: Diagnosis not present

## 2012-04-10 DIAGNOSIS — I2510 Atherosclerotic heart disease of native coronary artery without angina pectoris: Secondary | ICD-10-CM | POA: Diagnosis not present

## 2012-04-12 DIAGNOSIS — L821 Other seborrheic keratosis: Secondary | ICD-10-CM | POA: Diagnosis not present

## 2012-04-12 DIAGNOSIS — B079 Viral wart, unspecified: Secondary | ICD-10-CM | POA: Diagnosis not present

## 2012-04-12 DIAGNOSIS — L570 Actinic keratosis: Secondary | ICD-10-CM | POA: Diagnosis not present

## 2012-05-29 ENCOUNTER — Encounter (HOSPITAL_COMMUNITY): Payer: Medicare Other | Attending: Oncology

## 2012-05-29 DIAGNOSIS — D472 Monoclonal gammopathy: Secondary | ICD-10-CM | POA: Insufficient documentation

## 2012-05-29 DIAGNOSIS — I2510 Atherosclerotic heart disease of native coronary artery without angina pectoris: Secondary | ICD-10-CM | POA: Diagnosis not present

## 2012-05-29 DIAGNOSIS — N289 Disorder of kidney and ureter, unspecified: Secondary | ICD-10-CM | POA: Insufficient documentation

## 2012-05-29 LAB — CBC
Hemoglobin: 12.9 g/dL — ABNORMAL LOW (ref 13.0–17.0)
MCH: 28.9 pg (ref 26.0–34.0)
MCHC: 33.4 g/dL (ref 30.0–36.0)
MCV: 86.5 fL (ref 78.0–100.0)
Platelets: 181 10*3/uL (ref 150–400)
RBC: 4.46 MIL/uL (ref 4.22–5.81)

## 2012-05-29 LAB — COMPREHENSIVE METABOLIC PANEL
CO2: 27 mEq/L (ref 19–32)
Calcium: 10.3 mg/dL (ref 8.4–10.5)
Creatinine, Ser: 1.3 mg/dL (ref 0.50–1.35)
GFR calc Af Amer: 56 mL/min — ABNORMAL LOW (ref >90)
GFR calc non Af Amer: 49 mL/min — ABNORMAL LOW (ref >90)
Glucose, Bld: 103 mg/dL — ABNORMAL HIGH (ref 70–99)
Total Protein: 8.1 g/dL (ref 6.0–8.3)

## 2012-05-29 NOTE — Progress Notes (Signed)
Labs drawn today for cbc,cmp,kllc,mm panel

## 2012-05-30 LAB — KAPPA/LAMBDA LIGHT CHAINS
Kappa, lambda light chain ratio: 15.82 — ABNORMAL HIGH (ref 0.26–1.65)
Lambda free light chains: 1.58 mg/dL (ref 0.57–2.63)

## 2012-05-31 ENCOUNTER — Encounter (HOSPITAL_BASED_OUTPATIENT_CLINIC_OR_DEPARTMENT_OTHER): Payer: Medicare Other | Admitting: Oncology

## 2012-05-31 VITALS — BP 129/53 | HR 50 | Temp 97.6°F | Resp 18 | Wt 203.0 lb

## 2012-05-31 DIAGNOSIS — N289 Disorder of kidney and ureter, unspecified: Secondary | ICD-10-CM | POA: Diagnosis not present

## 2012-05-31 DIAGNOSIS — C649 Malignant neoplasm of unspecified kidney, except renal pelvis: Secondary | ICD-10-CM

## 2012-05-31 DIAGNOSIS — D472 Monoclonal gammopathy: Secondary | ICD-10-CM

## 2012-05-31 NOTE — Progress Notes (Signed)
Problem #1 MGUS x2 thus far stable without change. Problem #2 mesenteric adenopathy secondary to organizing fat ptosis status post 2 surgical procedures one in 2008 and the latter in 2009 showing no evidence for lymphoma. Problem #3 CAD seen Dr. Burnett Sheng at [facility] Problem #4 mild renal insufficiency Problem #5 renal cell carcinoma status post RFA ablation with a mild increase in his creatinine worsened by MRI contrast utilization in the past.   Marchia Meiers he is doing well no new complaints. He did have a steroid injection in his hip he states in his back discomfort got much better at least temporarily. This has lasted several weeks he states. Otherwise his laboratory work thus far is not complete yet but what we have back is stable and I will call him with the remainder the results on Friday. If he does not hear from me by noon on Friday he will call us. Otherwise we'll see him again in 6 months for followup

## 2012-05-31 NOTE — Patient Instructions (Addendum)
Brian Mcmillan  DOB 03/24/1925 CSN [id number]  MRN [id number] Dr. Glenford Peers   [facility]  Discharge Instructions  RECOMMENDATIONS MADE BY THE CONSULTANT AND ANY TEST RESULTS WILL BE SENT TO YOUR REFERRING DOCTOR.   EXAM FINDINGS BY MD TODAY AND SIGNS AND SYMPTOMS TO REPORT TO CLINIC OR PRIMARY MD: Labs we have so far are stable.  If you have not heard from [place] by 12 noon on Friday call us.  Brian Mcmillan [phone])  MEDICATIONS PRESCRIBED: none   INSTRUCTIONS GIVEN AND DISCUSSED: Other :  Report recurring infections, night sweats, fevers, etc.  SPECIAL INSTRUCTIONS/FOLLOW-UP: Lab work Needed in March and Return to Clinic after labs in March 2014.   I acknowledge that I have been informed and understand all the instructions given to me and received a copy. I do not have any more questions at this time, but understand that I may call the Specialty Clinic at [facility] at [phone] during business hours should I have any further questions or need assistance in obtaining follow-up care.    __________________________________________  _____________  __________ Signature of Patient or Authorized Representative            Date                   Time    __________________________________________ Nurse's Signature

## 2012-06-01 LAB — MULTIPLE MYELOMA PANEL, SERUM
Gamma Globulin: 22.9 % — ABNORMAL HIGH (ref 11.1–18.8)
IgG (Immunoglobin G), Serum: 1530 mg/dL (ref 650–1600)
M-Spike, %: 0.52 g/dL
Total Protein: 7.6 g/dL (ref 6.0–8.3)

## 2012-06-22 ENCOUNTER — Encounter: Payer: Self-pay | Admitting: Oncology

## 2012-07-17 DIAGNOSIS — E039 Hypothyroidism, unspecified: Secondary | ICD-10-CM | POA: Diagnosis not present

## 2012-07-17 DIAGNOSIS — R599 Enlarged lymph nodes, unspecified: Secondary | ICD-10-CM | POA: Diagnosis not present

## 2012-07-17 DIAGNOSIS — E119 Type 2 diabetes mellitus without complications: Secondary | ICD-10-CM | POA: Diagnosis not present

## 2012-07-17 DIAGNOSIS — I2510 Atherosclerotic heart disease of native coronary artery without angina pectoris: Secondary | ICD-10-CM | POA: Diagnosis not present

## 2012-07-17 DIAGNOSIS — J449 Chronic obstructive pulmonary disease, unspecified: Secondary | ICD-10-CM | POA: Diagnosis not present

## 2012-07-17 DIAGNOSIS — I10 Essential (primary) hypertension: Secondary | ICD-10-CM | POA: Diagnosis not present

## 2012-07-17 DIAGNOSIS — E785 Hyperlipidemia, unspecified: Secondary | ICD-10-CM | POA: Diagnosis not present

## 2012-07-17 DIAGNOSIS — D472 Monoclonal gammopathy: Secondary | ICD-10-CM | POA: Diagnosis not present

## 2012-07-21 DIAGNOSIS — E119 Type 2 diabetes mellitus without complications: Secondary | ICD-10-CM | POA: Diagnosis not present

## 2012-08-01 ENCOUNTER — Observation Stay (HOSPITAL_COMMUNITY)
Admission: EM | Admit: 2012-08-01 | Discharge: 2012-08-01 | Disposition: A | Discharge status: Home / Self Care | Payer: Medicare Other | Attending: Emergency Medicine | Admitting: Emergency Medicine

## 2012-08-01 ENCOUNTER — Encounter (HOSPITAL_COMMUNITY): Payer: Self-pay | Admitting: Physical Medicine and Rehabilitation

## 2012-08-01 ENCOUNTER — Emergency Department (HOSPITAL_COMMUNITY): Payer: Medicare Other

## 2012-08-01 DIAGNOSIS — I501 Left ventricular failure: Secondary | ICD-10-CM | POA: Insufficient documentation

## 2012-08-01 DIAGNOSIS — R0602 Shortness of breath: Secondary | ICD-10-CM | POA: Diagnosis not present

## 2012-08-01 DIAGNOSIS — D472 Monoclonal gammopathy: Secondary | ICD-10-CM | POA: Insufficient documentation

## 2012-08-01 DIAGNOSIS — I2510 Atherosclerotic heart disease of native coronary artery without angina pectoris: Secondary | ICD-10-CM | POA: Insufficient documentation

## 2012-08-01 DIAGNOSIS — R079 Chest pain, unspecified: Secondary | ICD-10-CM | POA: Diagnosis not present

## 2012-08-01 DIAGNOSIS — I10 Essential (primary) hypertension: Secondary | ICD-10-CM | POA: Insufficient documentation

## 2012-08-01 DIAGNOSIS — I517 Cardiomegaly: Secondary | ICD-10-CM | POA: Diagnosis not present

## 2012-08-01 DIAGNOSIS — E039 Hypothyroidism, unspecified: Secondary | ICD-10-CM | POA: Diagnosis not present

## 2012-08-01 LAB — GLUCOSE, CAPILLARY: Glucose-Capillary: 132 mg/dL — ABNORMAL HIGH (ref 70–99)

## 2012-08-01 LAB — CBC
HCT: 40.7 % (ref 39.0–52.0)
Hemoglobin: 14.1 g/dL (ref 13.0–17.0)
MCH: 29.3 pg (ref 26.0–34.0)
MCHC: 34.6 g/dL (ref 30.0–36.0)
MCV: 84.6 fL (ref 78.0–100.0)
RDW: 13.6 % (ref 11.5–15.5)

## 2012-08-01 LAB — COMPREHENSIVE METABOLIC PANEL
ALT: 15 U/L (ref 0–53)
AST: 23 U/L (ref 0–37)
Albumin: 4.1 g/dL (ref 3.5–5.2)
Alkaline Phosphatase: 61 U/L (ref 39–117)
Calcium: 10.4 mg/dL (ref 8.4–10.5)
GFR calc Af Amer: 39 mL/min — ABNORMAL LOW (ref >90)
Glucose, Bld: 130 mg/dL — ABNORMAL HIGH (ref 70–99)
Potassium: 4.1 mEq/L (ref 3.5–5.1)
Sodium: 135 mEq/L (ref 135–145)
Total Protein: 8.5 g/dL — ABNORMAL HIGH (ref 6.0–8.3)

## 2012-08-01 LAB — PROTIME-INR: Prothrombin Time: 14.4 seconds (ref 11.6–15.2)

## 2012-08-01 MED ORDER — CLOPIDOGREL BISULFATE 75 MG PO TABS: 150.0000 mg | ORAL_TABLET | Freq: Once | ORAL | Status: DC

## 2012-08-01 NOTE — Consult Note (Signed)
Cardiology consult note:  HPI   I was called to see the patient in the emergency room. I have seen him in the past for coronary artery disease. More recently he has been seen by the cardiology division at [facility]. I do not have any information from [facility]. He has known coronary disease. His last cath was September 2 011. It is known from the past that he has a large right coronary artery with heavy calcification and significant stenosis. We have thought that he might have some symptoms from this. However he has had severe recurrent nosebleeds over time. It was felt that coronary intervention was not prudent considering all of his medical problems including his nosebleeds.  The patient was active today. His wife tells me that he was actually on his roof doing a project. He was also doing many things in the yard. He ate breakfast but no lunch. He then became concerned and felt poorly in general. There was some chest discomfort. In the emergency room there is question that nitroglycerin helped. His EKG reveals no acute changes. History prominent is normal. He says he feels fine at this time.  Allergies  Allergen Reactions  . Aspirin Other (See Comments)    Causes severe bleeding  . Contrast Media (Iodinated Diagnostic Agents) Other (See Comments)    Will shut kidneys down    No current facility-administered medications for this encounter.   Current Outpatient Prescriptions  Medication Sig Dispense Refill  . albuterol-ipratropium (COMBIVENT) 18-103 MCG/ACT inhaler Inhale 1 puff into the lungs daily as needed.       Marland Kitchen amLODipine (NORVASC) 10 MG tablet Take 5 mg by mouth daily.       . carvedilol (COREG) 6.25 MG tablet Take 6.25 mg by mouth 2 (two) times daily with a meal.        . Cholecalciferol (VITAMIN D3) 5000 UNITS CAPS Take by mouth daily.      . fish oil-omega-3 fatty acids 1000 MG capsule Take 1 capsule by mouth daily.        Marland Kitchen levothyroxine (SYNTHROID, LEVOTHROID) 125 MCG tablet Take 125  mcg by mouth daily.       Marland Kitchen losartan (COZAAR) 100 MG tablet Take 1 tablet (100 mg total) by mouth daily.  30 tablet  3  . Meclizine HCl (ANTIVERT PO) Take by mouth as needed.      . montelukast (SINGULAIR) 10 MG tablet Take 10 mg by mouth as needed.        . Multiple Vitamin (MULTIVITAMIN) tablet Take 1 tablet by mouth daily.        . nitroGLYCERIN (NITROSTAT) 0.4 MG SL tablet Place 1 tablet (0.4 mg total) under the tongue as needed.  25 tablet  6  . potassium chloride (KLOR-CON) 10 MEQ CR tablet Take 10 mEq by mouth daily.        . Tamsulosin HCl (FLOMAX) 0.4 MG CAPS Take 0.4 mg by mouth at bedtime.       . vitamin B-12 (CYANOCOBALAMIN) 1000 MCG tablet Take 1,000 mcg by mouth daily.        Marland Kitchen ZOSTAVAX 40981 UNT/0.65ML injection Inject 0.65 mLs into the skin once.         History   Social History  . Marital Status: Married    Spouse Name: N/A    Number of Children: N/A  . Years of Education: N/A   Occupational History  . Retired    Social History Main Topics  . Smoking status:  Former Smoker -- 0.5 packs/day for 42 years    Types: Cigarettes    Quit date: 10/11/1980  . Smokeless tobacco: Never Used  . Alcohol Use: No  . Drug Use: Not on file  . Sexually Active: Not on file   Other Topics Concern  . Not on file   Social History Narrative  . No narrative on file    Family History  Problem Relation Age of Onset  . Coronary artery disease    . Cancer Maternal Grandfather     Past Medical History  Diagnosis Date  . Hypertension     no RAS  . Dyslipidemia   . Hypothyroidism   . Chest pain     myoview 2006 no ischemia/ myoview 2009 no ischemia  . CAD (coronary artery disease)     cath 09/23/09 70% Dx1 (diffuse disease), 40% Cx, Large RCA 80-90% with heavy calcification / cath 06/2010 no change tiht RCA still best to treat medially with nosebleeds  . Left ventricular dysfunction     myoview 2006 normal / myoview 2009 normal / EF 60% cath 09/2009, EF 60% cath 06/2010  .  Systolic click     no mitral valve prolapse  . Nosebleed     significant, can not take ASA  . Kidney mass 2008    radiofrequency ablation at [facility]  . 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 Memory impairment     memory decreasing 06/2010  . Rash Sept 2011    rash on buttocks, hospitalized hydralazine stopped but then restarted w/o return of rash  . Past heart attack     X2    Past Surgical History  Procedure Date  . Lymph node removal     right abdomen  . Lymph node removal     robotic laser @ [facility]  on left side of stomach  . Rfa right kidney   . Back surgery   . Bone marrow aspiration     X2  . Bone marrow biopsy     X2    Patient Active Problem List  Diagnosis  . HYPOTHYROIDISM  . DYSLIPIDEMIA  . HYPERTENSION  . CAD  . HEADACHE  . NOSEBLEED  . CHEST PAIN  . EDEMA  . Shortness of breath  . MGUS (monoclonal gammopathy of unknown significance)  . Renal cancer    ROS   Patient denies fever, chills, headache, sweats, rash, change in vision, change in hearing, cough, nausea vomiting, urinary symptoms. All other systems are reviewed and are negative.  PHYSICAL EXAM   At the time of my examination the patient's wife is in the room. He says that he feels fine now. Head is atraumatic. There is no jugulovenous distention. Lungs are clear. Respiratory effort is nonlabored. Cardiac exam reveals S1 and S2. There no clicks or significant murmurs. The abdomen is soft. There is no peripheral edema. There no musculoskeletal deformities. There are no skin rashes.  Filed Vitals:   08/01/12 1715 08/01/12 1743 08/01/12 1928  BP: 150/76 190/62 193/73  Pulse:  57 60  Temp: 97.6 F (36.4 C)  97.9 F (36.6 C)  TempSrc: Oral  Oral  Resp: 16 16 12   SpO2: 100% 98% 99%   I have reviewed the EKG. There are no acute changes.  ASSESSMENT & PLAN  The major problem is his presentation feeling unstable after working hard in the yard today.  It is very difficult to say if he  had some presyncope. His dietary intake was quite limited. There was some chest pain that is resolved. There is no EKG change. The troponin is normal. Overall the patient is stable. It is my feeling that if he can have some supper and ambulate with stability in the emergency room that he can go home. His followup can be with his doctors at [facility]  Hypertension   His systolic pressure is elevated at this time. The patient is very excited when he came in. His followup pressure is coming down. He should be On his usual medicines.  Coronary artery disease   Most recent cath in September, 2011. 70% diagonal with diffuse disease, 40% circumflex, 80-90% large right coronary artery with heavy calcification. I do not have any information about the patient's workup at [facility] since I saw him last.  Normal left ventricular function   By history his EF was 60% in 2011  History of significant nose bleeds.   The patient cannot take antiplatelet therapy.    History of kidney mass    Radiofrequency ablation at [facility] in the past  Memory impairment   Monoclonal gammopathy of unknown significance stable per Dr. 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

## 2012-08-01 NOTE — ED Notes (Signed)
Patient present to ED for chest pain that happened around 4pm.  Pain is located where he had his two MI, last week they found 4 blockage in his arteries.  He sat down when pain started and he lost his vision and sat down and lost control of his bladder.  Patient took 2 nitros PTA of EMS and 1 from EMS.  Currently is pain free.

## 2012-08-01 NOTE — ED Provider Notes (Signed)
History     CSN: [id number]  Arrival date & time 08/01/12  1700   First MD Initiated Contact with Patient 08/01/12 1707      Chief Complaint  Patient presents with  . Chest Pain    (Consider location/radiation/quality/duration/timing/severity/associated sxs/prior treatment) HPI  The patient presents with concerns of chest pain.  The pain began approximately one hour prior to ED arrival.  The pain was left inferior chest, severe.  The pain was nonradiating, improved after 3 sublingual nitroglycerin.  He notes that during the acute episode of pain he was nauseous, with no vomiting.  He also had abdominal discomfort. Onset of Sx was during exertion, the patient was on a roof. Per EMS, on their arrival the patient was diaphoretic, in apparent discomfort.  He received the third of his 3 nitroglycerin tablets en route, with resolution of his chest pain. The patient is a patient of the [facility] cardiology group here, though he has had some treatment in [place].  The patient and family report the he has several blockages being investigated at [facility].    the patient's history of multiple prior cancers, including MGUS.  No current radiation therapy.  Past Medical History  Diagnosis Date  . Hypertension     no RAS  . Dyslipidemia   . Hypothyroidism   . Chest pain     myoview 2006 no ischemia/ myoview 2009 no ischemia  . CAD (coronary artery disease)     cath 09/23/09 70% Dx1 (diffuse disease), 40% Cx, Large RCA 80-90% with heavy calcification / cath 06/2010 no change tiht RCA still best to treat medially with nosebleeds  . Left ventricular dysfunction     myoview 2006 normal / myoview 2009 normal / EF 60% cath 09/2009, EF 60% cath 06/2010  . Systolic click     no mitral valve prolapse  . Nosebleed     significant, can not take ASA  . Kidney mass 2008    radiofrequency ablation at [facility]  . 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 Memory impairment      memory decreasing 06/2010  . Rash Sept 2011    rash on buttocks, hospitalized hydralazine stopped but then restarted w/o return of rash  . Past heart attack     X2    Past Surgical History  Procedure Date  . Lymph node removal     right abdomen  . Lymph node removal     robotic laser @ [facility]  on left side of stomach  . Rfa right kidney   . Back surgery   . Bone marrow aspiration     X2  . Bone marrow biopsy     X2    Family History  Problem Relation Age of Onset  . Coronary artery disease    . Cancer Maternal Grandfather     History  Substance Use Topics  . Smoking status: Former Smoker -- 0.5 packs/day for 42 years    Types: Cigarettes    Quit date: 10/11/1980  . Smokeless tobacco: Never Used  . Alcohol Use: No      Review of Systems  Constitutional:       Per HPI, otherwise negative  HENT:       Per HPI, otherwise negative  Eyes: Negative.   Respiratory:       Per HPI, otherwise negative  Cardiovascular:       Per HPI, otherwise negative  Gastrointestinal: Positive for nausea.  Negative for vomiting.  Genitourinary: Negative.   Musculoskeletal:       Per HPI, otherwise negative  Skin: Negative.   Neurological: Positive for weakness. Negative for syncope.    Allergies  Aspirin and Contrast media  Home Medications   Current Outpatient Rx  Name Route Sig Dispense Refill  . IPRATROPIUM-ALBUTEROL 18-103 MCG/ACT IN AERO Inhalation Inhale 1 puff into the lungs daily as needed.     Marland Kitchen AMLODIPINE BESYLATE 10 MG PO TABS Oral Take 5 mg by mouth daily.     Marland Kitchen CARVEDILOL 6.25 MG PO TABS Oral Take 6.25 mg by mouth 2 (two) times daily with a meal.      . VITAMIN D3 5000 UNITS PO CAPS Oral Take by mouth daily.    . OMEGA-3 FATTY ACIDS 1000 MG PO CAPS Oral Take 1 capsule by mouth daily.      Marland Kitchen LEVOTHYROXINE SODIUM 125 MCG PO TABS Oral Take 125 mcg by mouth daily.     Marland Kitchen LOSARTAN POTASSIUM 100 MG PO TABS Oral Take 1 tablet (100 mg total) by mouth daily. 30 tablet 3   . ANTIVERT PO Oral Take by mouth as needed.    Marland Kitchen MONTELUKAST SODIUM 10 MG PO TABS Oral Take 10 mg by mouth as needed.      Marland Kitchen ONE-DAILY MULTI VITAMINS PO TABS Oral Take 1 tablet by mouth daily.      Marland Kitchen NITROGLYCERIN 0.4 MG SL SUBL Sublingual Place 1 tablet (0.4 mg total) under the tongue as needed. 25 tablet 6  . POTASSIUM CHLORIDE 10 MEQ PO TBCR Oral Take 10 mEq by mouth daily.      Marland Kitchen TAMSULOSIN HCL 0.4 MG PO CAPS Oral Take 0.4 mg by mouth at bedtime.     Marland Kitchen VITAMIN B-12 1000 MCG PO TABS Oral Take 1,000 mcg by mouth daily.      Marland Kitchen ZOSTAVAX 16109 UNT/0.65ML [place] SOLR Subcutaneous Inject 0.65 mLs into the skin once.       BP 190/62  Pulse 57  Temp 97.6 F (36.4 C) (Oral)  Resp 16  SpO2 98%  Physical Exam  Nursing note and vitals reviewed. Constitutional: He is oriented to person, place, and time. He appears well-developed. No distress.  HENT:  Head: Normocephalic and atraumatic.  Eyes: Conjunctivae normal and EOM are normal.  Cardiovascular: Normal rate and regular rhythm.   Pulmonary/Chest: Effort normal. No stridor. No respiratory distress.  Abdominal: He exhibits no distension.  Musculoskeletal: He exhibits no edema.  Neurological: He is alert and oriented to person, place, and time.  Skin: Skin is warm and dry.  Psychiatric: He has a normal mood and affect.    ED Course  Procedures (including critical care time)  Labs Reviewed  GLUCOSE, CAPILLARY - Abnormal; Notable for the following:    Glucose-Capillary 132 (*)     All other components within normal limits  COMPREHENSIVE METABOLIC PANEL - Abnormal; Notable for the following:    Glucose, Bld 130 (*)     BUN 27 (*)     Creatinine, Ser 1.76 (*)     Total Protein 8.5 (*)     GFR calc non Af Amer 34 (*)     GFR calc Af Amer 39 (*)     All other components within normal limits  CBC  TROPONIN I  PROTIME-INR  PRO B NATRIURETIC PEPTIDE   Dg Chest 1 View  08/01/2012  *RADIOLOGY REPORT*  Clinical Data: Chest pain, some  shortness of breath  CHEST -  1 VIEW  Comparison: Chest x-ray of 07/04/2010  Findings: The lungs remain clear but somewhat hyperaerated. Mediastinal contours are stable.  The heart is mildly enlarged and stable.  No bony abnormality is seen.  IMPRESSION: Slight hyperaeration.  No active lung disease.  Stable mild cardiomegaly.   Original Report Authenticated By: Juline Patch, M.D.      No diagnosis found.  on repeat exam the patient is painless.  The patient's family reports that they spoke with the patient's cardiologist at [facility] who requests a cardiology consult here.    Cardiac 70 sinus rhythm normal Pulse ox 100% room air normal   Multiple ECG reviewed from EMS - initially there is subtle lateral elevations, which are resolved on repeat ECG   Date: 08/01/2012  Rate: 56  Rhythm: normal sinus rhythm  QRS Axis: normal  Intervals: normal  ST/T Wave abnormalities: normal  Conduction Disutrbances:none  Narrative Interpretation:   Old EKG Reviewed: changes noted UNREMARKABLE - compared to EMS ecg, there is no lateral elevations  On repeat exam the patient remains asymptomatic.  I discussed the case with the patient's cardiology team.  MDM  This elderly male with history of prior MI, now presents following an episode of chest pain.  On my exam the patient's pain has resolved entirely, and he notes that he is back to his baseline condition.  On further evaluation, the patient reports that his discomfort was beyond just his chest, with abdominal pain, and general discomfort that occurred while he was working. I discussed his results with Dr. Myrtis Ser, his cardiologist, and we advises that the patient is safe for d/c with close f/u.  On my repeat exam the patient remains asymptomatic, and he was d/c in stable condition.  Though the patient has MMP, his capacity to F/U with his team at [facility], and the presence of family members to observe his make his discharge a reasonable  disposition.     Gerhard Munch, MD 08/01/12 2324

## 2012-08-03 DIAGNOSIS — Z23 Encounter for immunization: Secondary | ICD-10-CM | POA: Diagnosis not present

## 2012-08-05 DIAGNOSIS — J069 Acute upper respiratory infection, unspecified: Secondary | ICD-10-CM | POA: Diagnosis not present

## 2012-09-18 ENCOUNTER — Other Ambulatory Visit (HOSPITAL_COMMUNITY): Payer: Self-pay | Admitting: Family Medicine

## 2012-09-18 ENCOUNTER — Ambulatory Visit (HOSPITAL_COMMUNITY)
Admission: RE | Admit: 2012-09-18 | Discharge: 2012-09-18 | Disposition: A | Discharge status: Home / Self Care | Payer: Medicare Other | Source: Ambulatory Visit | Attending: Family Medicine | Admitting: Family Medicine

## 2012-09-18 DIAGNOSIS — R059 Cough, unspecified: Secondary | ICD-10-CM | POA: Insufficient documentation

## 2012-09-18 DIAGNOSIS — M1990 Unspecified osteoarthritis, unspecified site: Secondary | ICD-10-CM | POA: Diagnosis not present

## 2012-09-18 DIAGNOSIS — R52 Pain, unspecified: Secondary | ICD-10-CM

## 2012-09-18 DIAGNOSIS — M25551 Pain in right hip: Secondary | ICD-10-CM

## 2012-09-18 DIAGNOSIS — Z6826 Body mass index (BMI) 26.0-26.9, adult: Secondary | ICD-10-CM | POA: Diagnosis not present

## 2012-09-18 DIAGNOSIS — M25559 Pain in unspecified hip: Secondary | ICD-10-CM | POA: Diagnosis not present

## 2012-09-18 DIAGNOSIS — R05 Cough: Secondary | ICD-10-CM | POA: Diagnosis not present

## 2012-09-18 DIAGNOSIS — IMO0001 Reserved for inherently not codable concepts without codable children: Secondary | ICD-10-CM | POA: Diagnosis not present

## 2012-09-18 DIAGNOSIS — I10 Essential (primary) hypertension: Secondary | ICD-10-CM | POA: Diagnosis not present

## 2012-09-26 DIAGNOSIS — M1990 Unspecified osteoarthritis, unspecified site: Secondary | ICD-10-CM | POA: Diagnosis not present

## 2012-09-26 DIAGNOSIS — IMO0001 Reserved for inherently not codable concepts without codable children: Secondary | ICD-10-CM | POA: Diagnosis not present

## 2012-09-26 DIAGNOSIS — Z6826 Body mass index (BMI) 26.0-26.9, adult: Secondary | ICD-10-CM | POA: Diagnosis not present

## 2012-09-26 DIAGNOSIS — I10 Essential (primary) hypertension: Secondary | ICD-10-CM | POA: Diagnosis not present

## 2012-09-29 DIAGNOSIS — I10 Essential (primary) hypertension: Secondary | ICD-10-CM | POA: Diagnosis not present

## 2012-09-29 DIAGNOSIS — Z0000 Encounter for general adult medical examination without abnormal findings: Secondary | ICD-10-CM | POA: Diagnosis not present

## 2012-09-29 DIAGNOSIS — I2510 Atherosclerotic heart disease of native coronary artery without angina pectoris: Secondary | ICD-10-CM | POA: Diagnosis not present

## 2012-09-29 DIAGNOSIS — E119 Type 2 diabetes mellitus without complications: Secondary | ICD-10-CM | POA: Diagnosis not present

## 2012-09-29 DIAGNOSIS — J449 Chronic obstructive pulmonary disease, unspecified: Secondary | ICD-10-CM | POA: Diagnosis not present

## 2012-09-29 DIAGNOSIS — E039 Hypothyroidism, unspecified: Secondary | ICD-10-CM | POA: Diagnosis not present

## 2012-09-29 DIAGNOSIS — Z125 Encounter for screening for malignant neoplasm of prostate: Secondary | ICD-10-CM | POA: Diagnosis not present

## 2012-09-29 DIAGNOSIS — J4489 Other specified chronic obstructive pulmonary disease: Secondary | ICD-10-CM | POA: Diagnosis not present

## 2012-09-29 DIAGNOSIS — M5430 Sciatica, unspecified side: Secondary | ICD-10-CM | POA: Diagnosis not present

## 2012-09-29 DIAGNOSIS — D539 Nutritional anemia, unspecified: Secondary | ICD-10-CM | POA: Diagnosis not present

## 2012-10-18 DIAGNOSIS — E785 Hyperlipidemia, unspecified: Secondary | ICD-10-CM | POA: Diagnosis not present

## 2012-10-18 DIAGNOSIS — Z6826 Body mass index (BMI) 26.0-26.9, adult: Secondary | ICD-10-CM | POA: Diagnosis not present

## 2012-10-18 DIAGNOSIS — E119 Type 2 diabetes mellitus without complications: Secondary | ICD-10-CM | POA: Diagnosis not present

## 2012-10-18 DIAGNOSIS — I10 Essential (primary) hypertension: Secondary | ICD-10-CM | POA: Diagnosis not present

## 2012-10-19 DIAGNOSIS — M545 Low back pain: Secondary | ICD-10-CM | POA: Diagnosis not present

## 2012-10-19 DIAGNOSIS — I10 Essential (primary) hypertension: Secondary | ICD-10-CM | POA: Diagnosis not present

## 2012-11-06 DIAGNOSIS — I2510 Atherosclerotic heart disease of native coronary artery without angina pectoris: Secondary | ICD-10-CM | POA: Diagnosis not present

## 2012-11-06 DIAGNOSIS — I10 Essential (primary) hypertension: Secondary | ICD-10-CM | POA: Diagnosis not present

## 2012-11-06 DIAGNOSIS — E785 Hyperlipidemia, unspecified: Secondary | ICD-10-CM | POA: Diagnosis not present

## 2012-12-18 ENCOUNTER — Encounter (HOSPITAL_COMMUNITY): Payer: Medicare Other | Attending: Oncology

## 2012-12-18 DIAGNOSIS — D472 Monoclonal gammopathy: Secondary | ICD-10-CM | POA: Insufficient documentation

## 2012-12-18 DIAGNOSIS — I2510 Atherosclerotic heart disease of native coronary artery without angina pectoris: Secondary | ICD-10-CM | POA: Insufficient documentation

## 2012-12-18 LAB — CBC WITH DIFFERENTIAL/PLATELET
Basophils Absolute: 0 10*3/uL (ref 0.0–0.1)
Basophils Relative: 0 % (ref 0–1)
HCT: 38.8 % — ABNORMAL LOW (ref 39.0–52.0)
Lymphocytes Relative: 44 % (ref 12–46)
MCHC: 33 g/dL (ref 30.0–36.0)
Monocytes Absolute: 0.4 10*3/uL (ref 0.1–1.0)
Neutro Abs: 1.9 10*3/uL (ref 1.7–7.7)
Platelets: 185 10*3/uL (ref 150–400)
RDW: 13.9 % (ref 11.5–15.5)
WBC: 4.3 10*3/uL (ref 4.0–10.5)

## 2012-12-18 LAB — COMPREHENSIVE METABOLIC PANEL
ALT: 15 U/L (ref 0–53)
AST: 26 U/L (ref 0–37)
Albumin: 3.6 g/dL (ref 3.5–5.2)
Calcium: 9.7 mg/dL (ref 8.4–10.5)
Chloride: 104 mEq/L (ref 96–112)
Creatinine, Ser: 1.29 mg/dL (ref 0.50–1.35)
Sodium: 140 mEq/L (ref 135–145)
Total Bilirubin: 0.5 mg/dL (ref 0.3–1.2)

## 2012-12-18 NOTE — Progress Notes (Signed)
Labs drawn today for cbc/diff,cmp,kllc,mm panel 

## 2012-12-20 DIAGNOSIS — Z6826 Body mass index (BMI) 26.0-26.9, adult: Secondary | ICD-10-CM | POA: Diagnosis not present

## 2012-12-20 DIAGNOSIS — R05 Cough: Secondary | ICD-10-CM | POA: Diagnosis not present

## 2012-12-20 DIAGNOSIS — J069 Acute upper respiratory infection, unspecified: Secondary | ICD-10-CM | POA: Diagnosis not present

## 2012-12-22 LAB — KAPPA/LAMBDA LIGHT CHAINS
Kappa, lambda light chain ratio: 13.17 — ABNORMAL HIGH (ref 0.26–1.65)
Lambda free light chains: 1.01 mg/dL (ref 0.57–2.63)

## 2012-12-25 ENCOUNTER — Ambulatory Visit (HOSPITAL_COMMUNITY): Payer: Medicare Other | Admitting: Oncology

## 2012-12-25 LAB — MULTIPLE MYELOMA PANEL, SERUM
Albumin ELP: 52.5 % — ABNORMAL LOW (ref 55.8–66.1)
Alpha-1-Globulin: 3.9 % (ref 2.9–4.9)
Alpha-2-Globulin: 13.6 % — ABNORMAL HIGH (ref 7.1–11.8)
IgG (Immunoglobin G), Serum: 1520 mg/dL (ref 650–1600)
IgM, Serum: 550 mg/dL — ABNORMAL HIGH (ref 41–251)

## 2013-01-02 ENCOUNTER — Encounter (HOSPITAL_BASED_OUTPATIENT_CLINIC_OR_DEPARTMENT_OTHER): Payer: Medicare Other | Admitting: Oncology

## 2013-01-02 ENCOUNTER — Encounter (HOSPITAL_COMMUNITY): Payer: Self-pay | Admitting: Oncology

## 2013-01-02 VITALS — BP 131/67 | HR 55 | Temp 98.3°F | Resp 18 | Wt 201.7 lb

## 2013-01-02 DIAGNOSIS — N289 Disorder of kidney and ureter, unspecified: Secondary | ICD-10-CM | POA: Diagnosis not present

## 2013-01-02 DIAGNOSIS — D472 Monoclonal gammopathy: Secondary | ICD-10-CM | POA: Diagnosis not present

## 2013-01-02 DIAGNOSIS — C649 Malignant neoplasm of unspecified kidney, except renal pelvis: Secondary | ICD-10-CM

## 2013-01-02 NOTE — Progress Notes (Signed)
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 Nothing has changed significantly. He still has 2 monoclonal spikes, one is an IgG kappa and the other is an IgM kappa. Only the IgM kappa is mildly increased compared to several years ago. He is here really discussing his wife's state of health. He is very upset about it. She has lost 60 and possibly 70 pounds since the fall of 2013. She has no appetite. She can hardly walk 20 feet he states. He states that she moves her bowels only once every 4-5 days.  He wants are seen someplace such as [facility]. We are happy to see her if he so desires. I want him to discuss referral to either one of the Universities or here with his primary care physician who is also her primary care physician. Otherwise he appears stable sensory we will see him in 6 months with ongoing followup. He may at some point need to have a bone survey but I suspect this is still just MGUS

## 2013-01-02 NOTE — Patient Instructions (Addendum)
[facility] Discharge Instructions  RECOMMENDATIONS MADE BY THE CONSULTANT AND ANY TEST RESULTS WILL BE SENT TO YOUR REFERRING PHYSICIAN.  EXAM FINDINGS BY THE PHYSICIAN TODAY AND SIGNS OR SYMPTOMS TO REPORT TO CLINIC OR PRIMARY PHYSICIAN: Discussion by MD.  Your blood work is stable.  Report recurring infections, night sweats, fevers, etc.    MEDICATIONS PRESCRIBED:  none    SPECIAL INSTRUCTIONS/FOLLOW-UP: Return in 6 months for blood work then to be seen by MD.  Thank you for choosing [facility] to provide your oncology and hematology care.  To afford each patient quality time with our providers, please arrive at least 15 minutes before your scheduled appointment time.  With your help, our goal is to use those 15 minutes to complete the necessary work-up to ensure our physicians have the information they need to help with your evaluation and healthcare recommendations.    Effective January 1st, 2014, we ask that you re-schedule your appointment with our physicians should you arrive 10 or more minutes late for your appointment.  We strive to give you quality time with our providers, and arriving late affects you and other patients whose appointments are after yours.    Again, thank you for choosing [facility].  Our hope is that these requests will decrease the amount of time that you wait before being seen by our physicians.       _____________________________________________________________  Should you have questions after your visit to [facility], please contact our office at [phone] between the hours of 8:30 a.m. and 5:00 p.m.  Voicemails left after 4:30 p.m. will not be returned until the following business day.  For prescription refill requests, have your pharmacy contact our office with your prescription refill request.

## 2013-01-04 DIAGNOSIS — I10 Essential (primary) hypertension: Secondary | ICD-10-CM | POA: Diagnosis not present

## 2013-01-04 DIAGNOSIS — J069 Acute upper respiratory infection, unspecified: Secondary | ICD-10-CM | POA: Diagnosis not present

## 2013-01-04 DIAGNOSIS — J449 Chronic obstructive pulmonary disease, unspecified: Secondary | ICD-10-CM | POA: Diagnosis not present

## 2013-01-09 HISTORY — PX: COLONOSCOPY: SHX174

## 2013-01-30 ENCOUNTER — Telehealth (INDEPENDENT_AMBULATORY_CARE_PROVIDER_SITE_OTHER): Payer: Self-pay | Admitting: *Deleted

## 2013-01-30 ENCOUNTER — Other Ambulatory Visit (INDEPENDENT_AMBULATORY_CARE_PROVIDER_SITE_OTHER): Payer: Self-pay | Admitting: *Deleted

## 2013-01-30 DIAGNOSIS — Z1211 Encounter for screening for malignant neoplasm of colon: Secondary | ICD-10-CM

## 2013-01-30 MED ORDER — PEG-KCL-NACL-NASULF-NA ASC-C 100 G PO SOLR: 1.0000 | Freq: Once | ORAL | Status: DC

## 2013-01-30 NOTE — Telephone Encounter (Signed)
Patient needs movi prep 

## 2013-02-05 DIAGNOSIS — E785 Hyperlipidemia, unspecified: Secondary | ICD-10-CM | POA: Diagnosis not present

## 2013-02-05 DIAGNOSIS — J449 Chronic obstructive pulmonary disease, unspecified: Secondary | ICD-10-CM | POA: Diagnosis not present

## 2013-02-05 DIAGNOSIS — N183 Chronic kidney disease, stage 3 unspecified: Secondary | ICD-10-CM | POA: Diagnosis not present

## 2013-02-05 DIAGNOSIS — R0602 Shortness of breath: Secondary | ICD-10-CM | POA: Diagnosis not present

## 2013-02-05 DIAGNOSIS — I2510 Atherosclerotic heart disease of native coronary artery without angina pectoris: Secondary | ICD-10-CM | POA: Diagnosis not present

## 2013-02-05 DIAGNOSIS — E119 Type 2 diabetes mellitus without complications: Secondary | ICD-10-CM | POA: Diagnosis not present

## 2013-02-05 DIAGNOSIS — I10 Essential (primary) hypertension: Secondary | ICD-10-CM | POA: Diagnosis not present

## 2013-02-07 ENCOUNTER — Telehealth (INDEPENDENT_AMBULATORY_CARE_PROVIDER_SITE_OTHER): Payer: Self-pay | Admitting: *Deleted

## 2013-02-07 NOTE — Telephone Encounter (Signed)
agree

## 2013-02-07 NOTE — Telephone Encounter (Signed)
  Procedure: tcs  Reason/Indication:  screening  Has patient had this procedure before?  Yes, 2000 (paper chart)  If so, when, by whom and where?    Is there a family history of colon cancer?  no  Who?  What age when diagnosed?    Is patient diabetic?   Yes, diet controlled      Does patient have prosthetic heart valve?  no  Do you have a pacemaker?  no  Has patient ever had endocarditis? no  Has patient had joint replacement within last 12 months?  no  Is patient on Coumadin, Plavix and/or Aspirin? no  Medications: carvedilol 6.25 mg bid, amlodipine 5 mg bid, combivent respimat 14.7 g 4-6 puffs 6 times daily or PRN, montelukast 10 mg daily, levothyroxine 125 mg daily, losartan 100 mg daily, tamsulosin 0.4 mg daily, nitrostat 0.4 mg prn, pravastatin 20 mg daily, theragran M multi vit daily, vit b12 1000 mg daily, vit d 3 5000 IU daily, fish oil 1200 mg daily  Allergies: nkda  Medication Adjustment:   Procedure date & time: 03/07/13 at 830

## 2013-02-26 ENCOUNTER — Encounter (HOSPITAL_COMMUNITY): Payer: Self-pay | Admitting: Pharmacy Technician

## 2013-03-07 ENCOUNTER — Encounter (HOSPITAL_COMMUNITY): Payer: Self-pay

## 2013-03-07 ENCOUNTER — Encounter (HOSPITAL_COMMUNITY)
Admission: RE | Disposition: A | Discharge status: Home / Self Care | Payer: Self-pay | Source: Ambulatory Visit | Attending: Internal Medicine

## 2013-03-07 ENCOUNTER — Ambulatory Visit (HOSPITAL_COMMUNITY)
Admission: RE | Admit: 2013-03-07 | Discharge: 2013-03-07 | Disposition: A | Discharge status: Home / Self Care | Payer: Medicare Other | Source: Ambulatory Visit | Attending: Internal Medicine | Admitting: Internal Medicine

## 2013-03-07 DIAGNOSIS — K5730 Diverticulosis of large intestine without perforation or abscess without bleeding: Secondary | ICD-10-CM | POA: Insufficient documentation

## 2013-03-07 DIAGNOSIS — I10 Essential (primary) hypertension: Secondary | ICD-10-CM | POA: Insufficient documentation

## 2013-03-07 DIAGNOSIS — D126 Benign neoplasm of colon, unspecified: Secondary | ICD-10-CM | POA: Insufficient documentation

## 2013-03-07 DIAGNOSIS — E785 Hyperlipidemia, unspecified: Secondary | ICD-10-CM | POA: Insufficient documentation

## 2013-03-07 DIAGNOSIS — Z1211 Encounter for screening for malignant neoplasm of colon: Secondary | ICD-10-CM | POA: Insufficient documentation

## 2013-03-07 HISTORY — PX: COLONOSCOPY: SHX5424

## 2013-03-07 SURGERY — COLONOSCOPY
Anesthesia: Moderate Sedation

## 2013-03-07 MED ORDER — MIDAZOLAM HCL 5 MG/5ML IJ SOLN
INTRAMUSCULAR | Status: AC
  Filled 2013-03-07: qty 10

## 2013-03-07 MED ORDER — STERILE WATER FOR IRRIGATION IR SOLN
Status: DC | PRN
  Administered 2013-03-07: 10:00:00

## 2013-03-07 MED ORDER — MEPERIDINE HCL 50 MG/ML IJ SOLN
INTRAMUSCULAR | Status: AC
  Filled 2013-03-07: qty 1

## 2013-03-07 MED ORDER — MIDAZOLAM HCL 5 MG/5ML IJ SOLN
INTRAMUSCULAR | Status: DC | PRN
  Administered 2013-03-07 (×2): 2 mg via INTRAVENOUS

## 2013-03-07 MED ORDER — MEPERIDINE HCL 50 MG/ML IJ SOLN
INTRAMUSCULAR | Status: DC | PRN
  Administered 2013-03-07: 25 mg via INTRAVENOUS

## 2013-03-07 MED ORDER — SODIUM CHLORIDE 0.9 % IV SOLN
INTRAVENOUS | Status: DC
  Administered 2013-03-07: 09:00:00 via INTRAVENOUS

## 2013-03-07 NOTE — H&P (Signed)
Brian Mcmillan is an 79 y.o. male.   Chief Complaint: Patient's here for colonoscopy. HPI:  Patient is a 16-year-old Caucasian male who is here for screening colonoscopy. His last exam was in 2000. He has history of renal cell carcinoma as well as monoclonal gammopathy without evidence of lymphoma. She denies abdominal pain change in his bowel habits or rectal bleeding.  Family history is negative for colorectal carcinoma. Past Medical History  Diagnosis Date  . Hypertension     no RAS  . Dyslipidemia   . Hypothyroidism   . Chest pain     myoview 2006 no ischemia/ myoview 2009 no ischemia  . CAD (coronary artery disease)     cath 09/23/09 70% Dx1 (diffuse disease), 40% Cx, Large RCA 80-90% with heavy calcification / cath 06/2010 no change tiht RCA still best to treat medially with nosebleeds  . Left ventricular dysfunction     myoview 2006 normal / myoview 2009 normal / EF 60% cath 09/2009, EF 60% cath 06/2010  . Systolic click     no mitral valve prolapse  . Nosebleed     significant, can not take ASA  . Kidney mass 2008    radiofrequency ablation at [facility]  . 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 Memory impairment     memory decreasing 06/2010  . Rash Sept 2011    rash on buttocks, hospitalized hydralazine stopped but then restarted w/o return of rash  . Past heart attack     X2    Past Surgical History  Procedure Laterality Date  . Lymph node removal      right abdomen  . Lymph node removal      robotic laser @ [facility]  on left side of stomach  . Rfa right kidney    . Back surgery    . Bone marrow aspiration      X2  . Bone marrow biopsy      X2    Family History  Problem Relation Age of Onset  . Coronary artery disease    . Cancer Maternal Grandfather    Social History:  reports that he quit smoking about 32 years ago. His smoking use included Cigarettes. He has a 21 pack-year smoking history. He has never used  smokeless tobacco. He reports that he does not drink alcohol. His drug history is not on file.  Allergies:  Allergies  Allergen Reactions  . Aspirin Other (See Comments)    Causes severe bleeding  . Contrast Media (Iodinated Diagnostic Agents) Other (See Comments)    Will shut kidneys down    Medications Prior to Admission  Medication Sig Dispense Refill  . albuterol-ipratropium (COMBIVENT) 18-103 MCG/ACT inhaler Inhale 2 puffs into the lungs every 6 (six) hours as needed for wheezing.      Marland Kitchen amLODipine (NORVASC) 5 MG tablet Take 5 mg by mouth 2 (two) times daily.      . carvedilol (COREG) 6.25 MG tablet Take 6.25 mg by mouth 2 (two) times daily with a meal.        . Cholecalciferol (VITAMIN D3) 5000 UNITS CAPS Take 5,000 Units by mouth daily.       . fish oil-omega-3 fatty acids 1000 MG capsule Take 1 capsule by mouth daily.        Marland Kitchen levothyroxine (SYNTHROID, LEVOTHROID) 125 MCG tablet Take 125 mcg by mouth daily.       Marland Kitchen losartan (COZAAR) 100 MG  tablet Take 1 tablet (100 mg total) by mouth daily.  30 tablet  3  . montelukast (SINGULAIR) 10 MG tablet Take 10 mg by mouth at bedtime.       . Multiple Vitamin (MULTIVITAMIN) tablet Take 1 tablet by mouth daily.        . peg 3350 powder (MOVIPREP) 100 G SOLR Take 1 kit (100 g total) by mouth once.  1 kit  0  . pravastatin (PRAVACHOL) 20 MG tablet Take 20 mg by mouth at bedtime.      . Tamsulosin HCl (FLOMAX) 0.4 MG CAPS Take 0.4 mg by mouth at bedtime.       . vitamin B-12 (CYANOCOBALAMIN) 1000 MCG tablet Take 1,000 mcg by mouth daily.        . nitroGLYCERIN (NITRODUR - DOSED IN MG/24 HR) 0.4 mg/hr Place 1 patch onto the skin daily.      . nitroGLYCERIN (NITROSTAT) 0.4 MG SL tablet Place 1 tablet (0.4 mg total) under the tongue as needed.  25 tablet  6    No results found for this or any previous visit (from the past 48 hour(s)). No results found.  ROS  Blood pressure 126/68, pulse 66, temperature 97.6 F (36.4 C), temperature source  Oral, resp. rate 22, height 6\' 2"  (1.88 m), weight 195 lb (88.451 kg), SpO2 95.00%. Physical Exam  Constitutional: He appears well-developed and well-nourished.  HENT:  Mouth/Throat: Oropharynx is clear and moist.  Eyes: Conjunctivae are normal. No scleral icterus.  Neck: No thyromegaly present.  Cardiovascular: Normal rate, regular rhythm and normal heart sounds.   No murmur heard. Respiratory: Effort normal and breath sounds normal.  GI:  Midline scar and small umbilicus hernia which is reducible  Musculoskeletal: He exhibits no edema.  Lymphadenopathy:    He has no cervical adenopathy.  Neurological: He is alert.  Skin: Skin is warm and dry.     Assessment/Plan Average risk screening colonoscopy. Personal history significant for non GI malignancy.  Brian,NAJEEB Mcmillan 03/07/2013, 9:47 AM

## 2013-03-07 NOTE — Op Note (Signed)
COLONOSCOPY PROCEDURE REPORT  PATIENT:  Brian Mcmillan  MR#:  [id number] Birthdate:  1924-12-28, 79 y.o., male Endoscopist:  Dr. Malissa Hippo, MD Referred By:  Dr. Kirk Ruths, MD Procedure Date: 03/07/2013  Procedure:   Colonoscopy with snare polypectomy.  Indications:  Patient is an 79 year old Caucasian male who  is undergoing screening colonoscopy. Personal history is significant for non GI malignancies  Informed Consent:  The procedure and risks were reviewed with the patient and informed consent was obtained.  Medications:  Demerol 25 mg IV Versed 4 mg IV  Description of procedure:  After a digital rectal exam was performed, that colonoscope was advanced from the anus through the rectum and colon to the area of the cecum, ileocecal valve and appendiceal orifice. The cecum was deeply intubated. These structures were well-seen and photographed for the record. From the level of the cecum and ileocecal valve, the scope was slowly and cautiously withdrawn. The mucosal surfaces were carefully surveyed utilizing scope tip to flexion to facilitate fold flattening as needed. The scope was pulled down into the rectum where a thorough exam including retroflexion was performed.  Findings:   Prep satisfactory. Few streaks of blood noted coating the mucosa at cecum no lesion identified. AVMs suspected. 10 mm broad-based cecal polyp snared. 2 hemoclips applied to polypectomy site. 10 mm polyp snared from hepatic flexure. 3 polyps were removed from transverse colon and submitted together. One was 8 mm and was snared and the others were small and these are cold snared. Few small diverticula at sigmoid colon. Normal rectal mucosa and anal rectal junction.   Therapeutic/Diagnostic Maneuvers Performed:  See above  Complications:  None  Cecal Withdrawal Time:  49  minutes  Impression:  Examination performed to cecum. 10 mm broad-based polyp snared from cecum. Two hemoclips applied to  polypectomy site. 10 mm polyp snared from the hepatic flexure. Three polyps removed from transverse colon. 8 mm polyp was hot snared and the others were cold snared. Few small diverticula sigmoid colon.    Recommendations:  Standard instructions given. I will contact patient with biopsy results and further recommendations. Patient advised not to have MRI and tilt hemoclips have passed.  Alvis Edgell U  03/07/2013 11:04 AM  CC: Dr. Kirk Ruths, MD & Dr. Bonnetta Barry ref. provider found

## 2013-03-09 ENCOUNTER — Encounter (HOSPITAL_COMMUNITY): Payer: Self-pay | Admitting: Internal Medicine

## 2013-03-14 ENCOUNTER — Telehealth (INDEPENDENT_AMBULATORY_CARE_PROVIDER_SITE_OTHER): Payer: Self-pay | Admitting: *Deleted

## 2013-03-14 DIAGNOSIS — I209 Angina pectoris, unspecified: Secondary | ICD-10-CM | POA: Diagnosis not present

## 2013-03-14 DIAGNOSIS — I5031 Acute diastolic (congestive) heart failure: Secondary | ICD-10-CM | POA: Diagnosis not present

## 2013-03-14 DIAGNOSIS — R0602 Shortness of breath: Secondary | ICD-10-CM | POA: Diagnosis not present

## 2013-03-14 DIAGNOSIS — K625 Hemorrhage of anus and rectum: Secondary | ICD-10-CM | POA: Diagnosis not present

## 2013-03-14 DIAGNOSIS — I2510 Atherosclerotic heart disease of native coronary artery without angina pectoris: Secondary | ICD-10-CM | POA: Diagnosis not present

## 2013-03-14 NOTE — Telephone Encounter (Signed)
Brian Mcmillan had a bowel movement passing blood this morning. Would like to know what he should do? He had a TCS last Wednesday, 03/07/13. Has an apt at [facility] today and will be leaving his home at 11:00 am. The return phone number is [phone].

## 2013-03-14 NOTE — Telephone Encounter (Signed)
Patient called. Rec'd voicemail , left a detailed message for Brian Mcmillan, ask that he call me back. Dr.Rehman will be made aware.

## 2013-03-14 NOTE — Telephone Encounter (Signed)
I spoke with patient he states that he had a second BM and he saw blood in the toliet and on the tissue paper , this was both times. He was told at [facility] due to the swelling in his legs and feet/trouble breathing they were going to put him on Lasix . He had gained 6-8 pounds in the last 2 weeks. He states that they drew blood and was told his hemoglobin was 12. Something. He was advised to monitor his blood pressure. This was discussed with Dr.Rehman, he ask that the patient rest at home this evening, if his bleeding gets worse or if he gets light headed he is to go to the Ed. Dr.Rehman plans to call the patient this evening.

## 2013-03-15 ENCOUNTER — Encounter (INDEPENDENT_AMBULATORY_CARE_PROVIDER_SITE_OTHER): Payer: Self-pay | Admitting: *Deleted

## 2013-03-23 DIAGNOSIS — R079 Chest pain, unspecified: Secondary | ICD-10-CM | POA: Diagnosis not present

## 2013-03-23 DIAGNOSIS — I2510 Atherosclerotic heart disease of native coronary artery without angina pectoris: Secondary | ICD-10-CM | POA: Diagnosis not present

## 2013-03-23 DIAGNOSIS — J9819 Other pulmonary collapse: Secondary | ICD-10-CM | POA: Diagnosis not present

## 2013-03-23 DIAGNOSIS — R51 Headache: Secondary | ICD-10-CM | POA: Diagnosis not present

## 2013-03-23 DIAGNOSIS — Z8249 Family history of ischemic heart disease and other diseases of the circulatory system: Secondary | ICD-10-CM | POA: Diagnosis not present

## 2013-03-23 DIAGNOSIS — Z7901 Long term (current) use of anticoagulants: Secondary | ICD-10-CM | POA: Diagnosis not present

## 2013-03-23 DIAGNOSIS — E119 Type 2 diabetes mellitus without complications: Secondary | ICD-10-CM | POA: Diagnosis not present

## 2013-03-23 DIAGNOSIS — J449 Chronic obstructive pulmonary disease, unspecified: Secondary | ICD-10-CM | POA: Diagnosis not present

## 2013-03-23 DIAGNOSIS — E039 Hypothyroidism, unspecified: Secondary | ICD-10-CM | POA: Diagnosis not present

## 2013-03-23 DIAGNOSIS — Z87891 Personal history of nicotine dependence: Secondary | ICD-10-CM | POA: Diagnosis not present

## 2013-03-23 DIAGNOSIS — J984 Other disorders of lung: Secondary | ICD-10-CM | POA: Diagnosis not present

## 2013-03-23 DIAGNOSIS — Z91041 Radiographic dye allergy status: Secondary | ICD-10-CM | POA: Diagnosis not present

## 2013-03-23 DIAGNOSIS — Z886 Allergy status to analgesic agent status: Secondary | ICD-10-CM | POA: Diagnosis not present

## 2013-03-23 DIAGNOSIS — R61 Generalized hyperhidrosis: Secondary | ICD-10-CM | POA: Diagnosis not present

## 2013-03-23 DIAGNOSIS — M542 Cervicalgia: Secondary | ICD-10-CM | POA: Diagnosis not present

## 2013-03-23 DIAGNOSIS — I10 Essential (primary) hypertension: Secondary | ICD-10-CM | POA: Diagnosis not present

## 2013-03-23 DIAGNOSIS — M79609 Pain in unspecified limb: Secondary | ICD-10-CM

## 2013-03-23 DIAGNOSIS — R5381 Other malaise: Secondary | ICD-10-CM | POA: Diagnosis not present

## 2013-03-23 DIAGNOSIS — J441 Chronic obstructive pulmonary disease with (acute) exacerbation: Secondary | ICD-10-CM | POA: Diagnosis not present

## 2013-03-23 DIAGNOSIS — I509 Heart failure, unspecified: Secondary | ICD-10-CM | POA: Diagnosis not present

## 2013-03-23 DIAGNOSIS — D518 Other vitamin B12 deficiency anemias: Secondary | ICD-10-CM | POA: Diagnosis not present

## 2013-03-23 DIAGNOSIS — I209 Angina pectoris, unspecified: Secondary | ICD-10-CM | POA: Diagnosis not present

## 2013-03-23 DIAGNOSIS — Z85528 Personal history of other malignant neoplasm of kidney: Secondary | ICD-10-CM | POA: Diagnosis not present

## 2013-03-23 DIAGNOSIS — M62838 Other muscle spasm: Secondary | ICD-10-CM | POA: Diagnosis not present

## 2013-03-23 DIAGNOSIS — I252 Old myocardial infarction: Secondary | ICD-10-CM | POA: Diagnosis not present

## 2013-03-23 DIAGNOSIS — J438 Other emphysema: Secondary | ICD-10-CM | POA: Diagnosis not present

## 2013-03-23 DIAGNOSIS — R799 Abnormal finding of blood chemistry, unspecified: Secondary | ICD-10-CM | POA: Diagnosis not present

## 2013-03-23 DIAGNOSIS — Z79899 Other long term (current) drug therapy: Secondary | ICD-10-CM | POA: Diagnosis not present

## 2013-03-23 DIAGNOSIS — R5383 Other fatigue: Secondary | ICD-10-CM | POA: Diagnosis not present

## 2013-03-23 DIAGNOSIS — M47812 Spondylosis without myelopathy or radiculopathy, cervical region: Secondary | ICD-10-CM | POA: Diagnosis not present

## 2013-03-24 DIAGNOSIS — M7989 Other specified soft tissue disorders: Secondary | ICD-10-CM | POA: Diagnosis not present

## 2013-03-24 DIAGNOSIS — R079 Chest pain, unspecified: Secondary | ICD-10-CM | POA: Diagnosis not present

## 2013-03-30 DIAGNOSIS — I209 Angina pectoris, unspecified: Secondary | ICD-10-CM | POA: Diagnosis not present

## 2013-03-30 DIAGNOSIS — I5032 Chronic diastolic (congestive) heart failure: Secondary | ICD-10-CM | POA: Diagnosis not present

## 2013-03-30 DIAGNOSIS — I2510 Atherosclerotic heart disease of native coronary artery without angina pectoris: Secondary | ICD-10-CM | POA: Diagnosis not present

## 2013-04-30 DIAGNOSIS — I2510 Atherosclerotic heart disease of native coronary artery without angina pectoris: Secondary | ICD-10-CM | POA: Diagnosis not present

## 2013-04-30 DIAGNOSIS — I10 Essential (primary) hypertension: Secondary | ICD-10-CM | POA: Diagnosis not present

## 2013-04-30 DIAGNOSIS — I5032 Chronic diastolic (congestive) heart failure: Secondary | ICD-10-CM | POA: Diagnosis not present

## 2013-04-30 DIAGNOSIS — R0602 Shortness of breath: Secondary | ICD-10-CM | POA: Diagnosis not present

## 2013-04-30 DIAGNOSIS — I209 Angina pectoris, unspecified: Secondary | ICD-10-CM | POA: Diagnosis not present

## 2013-05-16 DIAGNOSIS — E119 Type 2 diabetes mellitus without complications: Secondary | ICD-10-CM | POA: Diagnosis not present

## 2013-05-16 DIAGNOSIS — H43399 Other vitreous opacities, unspecified eye: Secondary | ICD-10-CM | POA: Diagnosis not present

## 2013-05-16 DIAGNOSIS — H26499 Other secondary cataract, unspecified eye: Secondary | ICD-10-CM | POA: Diagnosis not present

## 2013-06-13 DIAGNOSIS — I2510 Atherosclerotic heart disease of native coronary artery without angina pectoris: Secondary | ICD-10-CM | POA: Diagnosis not present

## 2013-06-13 DIAGNOSIS — I209 Angina pectoris, unspecified: Secondary | ICD-10-CM | POA: Diagnosis not present

## 2013-06-13 DIAGNOSIS — I5032 Chronic diastolic (congestive) heart failure: Secondary | ICD-10-CM | POA: Diagnosis not present

## 2013-06-13 DIAGNOSIS — I10 Essential (primary) hypertension: Secondary | ICD-10-CM | POA: Diagnosis not present

## 2013-07-05 ENCOUNTER — Other Ambulatory Visit (HOSPITAL_COMMUNITY): Payer: Medicare Other

## 2013-07-13 ENCOUNTER — Ambulatory Visit (HOSPITAL_COMMUNITY): Payer: Medicare Other

## 2013-07-13 DIAGNOSIS — R040 Epistaxis: Secondary | ICD-10-CM | POA: Diagnosis not present

## 2013-07-13 DIAGNOSIS — Z23 Encounter for immunization: Secondary | ICD-10-CM | POA: Diagnosis not present

## 2013-07-13 DIAGNOSIS — E1129 Type 2 diabetes mellitus with other diabetic kidney complication: Secondary | ICD-10-CM | POA: Diagnosis not present

## 2013-07-13 DIAGNOSIS — Z6826 Body mass index (BMI) 26.0-26.9, adult: Secondary | ICD-10-CM | POA: Diagnosis not present

## 2013-07-13 DIAGNOSIS — I10 Essential (primary) hypertension: Secondary | ICD-10-CM | POA: Diagnosis not present

## 2013-07-17 ENCOUNTER — Encounter (HOSPITAL_COMMUNITY)
Admission: RE | Admit: 2013-07-17 | Discharge: 2013-07-17 | Disposition: A | Discharge status: Home / Self Care | Payer: Medicare Other | Source: Ambulatory Visit | Attending: Cardiovascular Disease | Admitting: Cardiovascular Disease

## 2013-07-17 ENCOUNTER — Encounter (HOSPITAL_COMMUNITY): Payer: Self-pay

## 2013-07-17 VITALS — BP 150/68 | HR 71 | Ht 74.0 in | Wt 208.6 lb

## 2013-07-17 DIAGNOSIS — I209 Angina pectoris, unspecified: Secondary | ICD-10-CM | POA: Insufficient documentation

## 2013-07-17 DIAGNOSIS — Z5189 Encounter for other specified aftercare: Secondary | ICD-10-CM | POA: Insufficient documentation

## 2013-07-17 NOTE — Patient Instructions (Signed)
Pt has finished orientation and is scheduled to start CR on 07/23/13 at 9:30. Pt has been instructed to arrive to class 15 minutes early for scheduled class. Pt has been instructed to wear comfortable clothing and shoes with rubber soles. Pt has been told to take their medications 1 hour prior to coming to class.  If the patient is not going to attend class, he/she has been instructed to call.

## 2013-07-17 NOTE — Progress Notes (Signed)
Patient referred to CR by Dr. [name] due to Stable Angina 413.9. During orientation advised patient on arrival and appointment times what to wear, what to do before, during and after exercise. Reviewed attendance and class policy. Talked about inclement weather and class consultation policy. Pt is scheduled to start Cardiac Rehab on 07/23/13 at 9:30. Pt was advised to come to class 5 minutes before class starts. He was also given instructions on meeting with the dietician and attending the Family Structure classes. Pt is eager to get started. Patient was able to complete 6 minute walk test.

## 2013-07-23 ENCOUNTER — Encounter (HOSPITAL_COMMUNITY)
Admission: RE | Admit: 2013-07-23 | Discharge: 2013-07-23 | Disposition: A | Discharge status: Home / Self Care | Payer: Medicare Other | Source: Ambulatory Visit | Attending: Cardiovascular Disease | Admitting: Cardiovascular Disease

## 2013-07-23 DIAGNOSIS — Z5189 Encounter for other specified aftercare: Secondary | ICD-10-CM | POA: Diagnosis not present

## 2013-07-23 DIAGNOSIS — I209 Angina pectoris, unspecified: Secondary | ICD-10-CM | POA: Diagnosis not present

## 2013-07-25 ENCOUNTER — Encounter (HOSPITAL_COMMUNITY)
Admission: RE | Admit: 2013-07-25 | Discharge: 2013-07-25 | Disposition: A | Discharge status: Home / Self Care | Payer: Medicare Other | Source: Ambulatory Visit | Attending: Cardiovascular Disease | Admitting: Cardiovascular Disease

## 2013-07-25 DIAGNOSIS — Z5189 Encounter for other specified aftercare: Secondary | ICD-10-CM | POA: Diagnosis not present

## 2013-07-25 DIAGNOSIS — I209 Angina pectoris, unspecified: Secondary | ICD-10-CM | POA: Diagnosis not present

## 2013-07-25 DIAGNOSIS — I519 Heart disease, unspecified: Secondary | ICD-10-CM | POA: Diagnosis not present

## 2013-07-25 DIAGNOSIS — R040 Epistaxis: Secondary | ICD-10-CM | POA: Diagnosis not present

## 2013-07-27 ENCOUNTER — Encounter (HOSPITAL_COMMUNITY)
Admission: RE | Admit: 2013-07-27 | Discharge: 2013-07-27 | Disposition: A | Discharge status: Home / Self Care | Payer: Medicare Other | Source: Ambulatory Visit | Attending: Cardiovascular Disease | Admitting: Cardiovascular Disease

## 2013-07-27 DIAGNOSIS — Z5189 Encounter for other specified aftercare: Secondary | ICD-10-CM | POA: Diagnosis not present

## 2013-07-27 DIAGNOSIS — I209 Angina pectoris, unspecified: Secondary | ICD-10-CM | POA: Diagnosis not present

## 2013-07-30 ENCOUNTER — Encounter (HOSPITAL_COMMUNITY)
Admission: RE | Admit: 2013-07-30 | Discharge: 2013-07-30 | Disposition: A | Discharge status: Home / Self Care | Payer: Medicare Other | Source: Ambulatory Visit | Attending: Cardiovascular Disease | Admitting: Cardiovascular Disease

## 2013-07-30 DIAGNOSIS — I209 Angina pectoris, unspecified: Secondary | ICD-10-CM | POA: Diagnosis not present

## 2013-07-30 DIAGNOSIS — Z5189 Encounter for other specified aftercare: Secondary | ICD-10-CM | POA: Diagnosis not present

## 2013-08-01 ENCOUNTER — Encounter (HOSPITAL_COMMUNITY)
Admission: RE | Admit: 2013-08-01 | Discharge: 2013-08-01 | Disposition: A | Discharge status: Home / Self Care | Payer: Medicare Other | Source: Ambulatory Visit | Attending: Cardiovascular Disease | Admitting: Cardiovascular Disease

## 2013-08-01 DIAGNOSIS — Z5189 Encounter for other specified aftercare: Secondary | ICD-10-CM | POA: Diagnosis not present

## 2013-08-01 DIAGNOSIS — I209 Angina pectoris, unspecified: Secondary | ICD-10-CM | POA: Diagnosis not present

## 2013-08-03 ENCOUNTER — Encounter (HOSPITAL_COMMUNITY)
Admission: RE | Admit: 2013-08-03 | Discharge: 2013-08-03 | Disposition: A | Discharge status: Home / Self Care | Payer: Medicare Other | Source: Ambulatory Visit | Attending: Cardiovascular Disease | Admitting: Cardiovascular Disease

## 2013-08-03 DIAGNOSIS — Z5189 Encounter for other specified aftercare: Secondary | ICD-10-CM | POA: Diagnosis not present

## 2013-08-03 DIAGNOSIS — I209 Angina pectoris, unspecified: Secondary | ICD-10-CM | POA: Diagnosis not present

## 2013-08-06 ENCOUNTER — Encounter (HOSPITAL_COMMUNITY)
Admission: RE | Admit: 2013-08-06 | Discharge: 2013-08-06 | Disposition: A | Discharge status: Home / Self Care | Payer: Medicare Other | Source: Ambulatory Visit | Attending: Cardiovascular Disease | Admitting: Cardiovascular Disease

## 2013-08-06 DIAGNOSIS — Z5189 Encounter for other specified aftercare: Secondary | ICD-10-CM | POA: Diagnosis not present

## 2013-08-06 DIAGNOSIS — I209 Angina pectoris, unspecified: Secondary | ICD-10-CM | POA: Diagnosis not present

## 2013-08-08 ENCOUNTER — Encounter (HOSPITAL_COMMUNITY)
Admission: RE | Admit: 2013-08-08 | Discharge: 2013-08-08 | Disposition: A | Discharge status: Home / Self Care | Payer: Medicare Other | Source: Ambulatory Visit | Attending: Cardiovascular Disease | Admitting: Cardiovascular Disease

## 2013-08-08 DIAGNOSIS — Z5189 Encounter for other specified aftercare: Secondary | ICD-10-CM | POA: Diagnosis not present

## 2013-08-08 DIAGNOSIS — I209 Angina pectoris, unspecified: Secondary | ICD-10-CM | POA: Diagnosis not present

## 2013-08-10 ENCOUNTER — Encounter (HOSPITAL_COMMUNITY)
Admission: RE | Admit: 2013-08-10 | Discharge: 2013-08-10 | Disposition: A | Discharge status: Home / Self Care | Payer: Medicare Other | Source: Ambulatory Visit | Attending: Cardiovascular Disease | Admitting: Cardiovascular Disease

## 2013-08-10 DIAGNOSIS — I209 Angina pectoris, unspecified: Secondary | ICD-10-CM | POA: Diagnosis not present

## 2013-08-10 DIAGNOSIS — Z5189 Encounter for other specified aftercare: Secondary | ICD-10-CM | POA: Diagnosis not present

## 2013-08-13 ENCOUNTER — Encounter (HOSPITAL_COMMUNITY)
Admission: RE | Admit: 2013-08-13 | Discharge: 2013-08-13 | Disposition: A | Discharge status: Home / Self Care | Payer: Medicare Other | Source: Ambulatory Visit | Attending: Cardiovascular Disease | Admitting: Cardiovascular Disease

## 2013-08-13 DIAGNOSIS — Z5189 Encounter for other specified aftercare: Secondary | ICD-10-CM | POA: Diagnosis not present

## 2013-08-13 DIAGNOSIS — I209 Angina pectoris, unspecified: Secondary | ICD-10-CM | POA: Insufficient documentation

## 2013-08-15 ENCOUNTER — Encounter (HOSPITAL_COMMUNITY)
Admission: RE | Admit: 2013-08-15 | Discharge: 2013-08-15 | Disposition: A | Discharge status: Home / Self Care | Payer: Medicare Other | Source: Ambulatory Visit | Attending: Cardiovascular Disease | Admitting: Cardiovascular Disease

## 2013-08-17 ENCOUNTER — Encounter (HOSPITAL_COMMUNITY)
Admission: RE | Admit: 2013-08-17 | Discharge: 2013-08-17 | Disposition: A | Discharge status: Home / Self Care | Payer: Medicare Other | Source: Ambulatory Visit | Attending: Cardiovascular Disease | Admitting: Cardiovascular Disease

## 2013-08-20 ENCOUNTER — Encounter (HOSPITAL_COMMUNITY)
Admission: RE | Admit: 2013-08-20 | Discharge: 2013-08-20 | Disposition: A | Discharge status: Home / Self Care | Payer: Medicare Other | Source: Ambulatory Visit | Attending: Cardiovascular Disease | Admitting: Cardiovascular Disease

## 2013-08-22 ENCOUNTER — Encounter (HOSPITAL_COMMUNITY)
Admission: RE | Admit: 2013-08-22 | Discharge: 2013-08-22 | Disposition: A | Discharge status: Home / Self Care | Payer: Medicare Other | Source: Ambulatory Visit | Attending: Cardiovascular Disease | Admitting: Cardiovascular Disease

## 2013-08-23 DIAGNOSIS — R040 Epistaxis: Secondary | ICD-10-CM | POA: Diagnosis not present

## 2013-08-23 DIAGNOSIS — I2510 Atherosclerotic heart disease of native coronary artery without angina pectoris: Secondary | ICD-10-CM | POA: Diagnosis not present

## 2013-08-23 DIAGNOSIS — I209 Angina pectoris, unspecified: Secondary | ICD-10-CM | POA: Diagnosis not present

## 2013-08-23 DIAGNOSIS — I10 Essential (primary) hypertension: Secondary | ICD-10-CM | POA: Diagnosis not present

## 2013-08-24 ENCOUNTER — Encounter (HOSPITAL_COMMUNITY)
Admission: RE | Admit: 2013-08-24 | Discharge: 2013-08-24 | Disposition: A | Discharge status: Home / Self Care | Payer: Medicare Other | Source: Ambulatory Visit | Attending: Cardiovascular Disease | Admitting: Cardiovascular Disease

## 2013-08-27 ENCOUNTER — Encounter (HOSPITAL_COMMUNITY)
Admission: RE | Admit: 2013-08-27 | Discharge: 2013-08-27 | Disposition: A | Discharge status: Home / Self Care | Payer: Medicare Other | Source: Ambulatory Visit | Attending: Cardiovascular Disease | Admitting: Cardiovascular Disease

## 2013-08-29 ENCOUNTER — Encounter (HOSPITAL_COMMUNITY)
Admission: RE | Admit: 2013-08-29 | Discharge: 2013-08-29 | Disposition: A | Discharge status: Home / Self Care | Payer: Medicare Other | Source: Ambulatory Visit | Attending: Cardiovascular Disease | Admitting: Cardiovascular Disease

## 2013-08-31 ENCOUNTER — Encounter (HOSPITAL_COMMUNITY)
Admission: RE | Admit: 2013-08-31 | Discharge: 2013-08-31 | Disposition: A | Discharge status: Home / Self Care | Payer: Medicare Other | Source: Ambulatory Visit | Attending: Cardiovascular Disease | Admitting: Cardiovascular Disease

## 2013-09-03 ENCOUNTER — Encounter (HOSPITAL_COMMUNITY)
Admission: RE | Admit: 2013-09-03 | Discharge: 2013-09-03 | Disposition: A | Discharge status: Home / Self Care | Payer: Medicare Other | Source: Ambulatory Visit | Attending: Cardiovascular Disease | Admitting: Cardiovascular Disease

## 2013-09-05 ENCOUNTER — Encounter (HOSPITAL_COMMUNITY)
Admission: RE | Admit: 2013-09-05 | Discharge: 2013-09-05 | Disposition: A | Discharge status: Home / Self Care | Payer: Medicare Other | Source: Ambulatory Visit | Attending: Cardiovascular Disease | Admitting: Cardiovascular Disease

## 2013-09-07 ENCOUNTER — Encounter (HOSPITAL_COMMUNITY): Payer: Medicare Other

## 2013-09-10 ENCOUNTER — Encounter (HOSPITAL_COMMUNITY)
Admission: RE | Admit: 2013-09-10 | Discharge: 2013-09-10 | Disposition: A | Discharge status: Home / Self Care | Payer: Medicare Other | Source: Ambulatory Visit | Attending: Cardiovascular Disease | Admitting: Cardiovascular Disease

## 2013-09-10 DIAGNOSIS — Z5189 Encounter for other specified aftercare: Secondary | ICD-10-CM | POA: Insufficient documentation

## 2013-09-10 DIAGNOSIS — I209 Angina pectoris, unspecified: Secondary | ICD-10-CM | POA: Diagnosis not present

## 2013-09-11 DIAGNOSIS — I209 Angina pectoris, unspecified: Secondary | ICD-10-CM | POA: Diagnosis not present

## 2013-09-11 DIAGNOSIS — I5032 Chronic diastolic (congestive) heart failure: Secondary | ICD-10-CM | POA: Diagnosis not present

## 2013-09-11 DIAGNOSIS — I2510 Atherosclerotic heart disease of native coronary artery without angina pectoris: Secondary | ICD-10-CM | POA: Diagnosis not present

## 2013-09-11 DIAGNOSIS — R040 Epistaxis: Secondary | ICD-10-CM | POA: Diagnosis not present

## 2013-09-12 ENCOUNTER — Encounter (HOSPITAL_COMMUNITY)
Admission: RE | Admit: 2013-09-12 | Discharge: 2013-09-12 | Disposition: A | Discharge status: Home / Self Care | Payer: Medicare Other | Source: Ambulatory Visit | Attending: Cardiovascular Disease | Admitting: Cardiovascular Disease

## 2013-09-14 ENCOUNTER — Encounter (HOSPITAL_COMMUNITY)
Admission: RE | Admit: 2013-09-14 | Discharge: 2013-09-14 | Disposition: A | Discharge status: Home / Self Care | Payer: Medicare Other | Source: Ambulatory Visit | Attending: Cardiovascular Disease | Admitting: Cardiovascular Disease

## 2013-09-17 ENCOUNTER — Encounter (HOSPITAL_COMMUNITY)
Admission: RE | Admit: 2013-09-17 | Discharge: 2013-09-17 | Disposition: A | Discharge status: Home / Self Care | Payer: Medicare Other | Source: Ambulatory Visit | Attending: Cardiovascular Disease | Admitting: Cardiovascular Disease

## 2013-09-19 ENCOUNTER — Encounter (HOSPITAL_COMMUNITY)
Admission: RE | Admit: 2013-09-19 | Discharge: 2013-09-19 | Disposition: A | Discharge status: Home / Self Care | Payer: Medicare Other | Source: Ambulatory Visit | Attending: Cardiovascular Disease | Admitting: Cardiovascular Disease

## 2013-09-21 ENCOUNTER — Encounter (HOSPITAL_COMMUNITY): Admission: RE | Admit: 2013-09-21 | Payer: Medicare Other | Source: Ambulatory Visit

## 2013-09-24 ENCOUNTER — Encounter (HOSPITAL_COMMUNITY)
Admission: RE | Admit: 2013-09-24 | Discharge: 2013-09-24 | Disposition: A | Discharge status: Home / Self Care | Payer: Medicare Other | Source: Ambulatory Visit | Attending: Cardiovascular Disease | Admitting: Cardiovascular Disease

## 2013-09-26 ENCOUNTER — Encounter (HOSPITAL_COMMUNITY)
Admission: RE | Admit: 2013-09-26 | Discharge: 2013-09-26 | Disposition: A | Discharge status: Home / Self Care | Payer: Medicare Other | Source: Ambulatory Visit | Attending: Cardiovascular Disease | Admitting: Cardiovascular Disease

## 2013-09-28 ENCOUNTER — Encounter (HOSPITAL_COMMUNITY)
Admission: RE | Admit: 2013-09-28 | Discharge: 2013-09-28 | Disposition: A | Discharge status: Home / Self Care | Payer: Medicare Other | Source: Ambulatory Visit | Attending: Cardiovascular Disease | Admitting: Cardiovascular Disease

## 2013-10-01 ENCOUNTER — Encounter (HOSPITAL_COMMUNITY)
Admission: RE | Admit: 2013-10-01 | Discharge: 2013-10-01 | Disposition: A | Discharge status: Home / Self Care | Payer: Medicare Other | Source: Ambulatory Visit | Attending: Cardiovascular Disease | Admitting: Cardiovascular Disease

## 2013-10-03 ENCOUNTER — Encounter (HOSPITAL_COMMUNITY)
Admission: RE | Admit: 2013-10-03 | Discharge: 2013-10-03 | Disposition: A | Discharge status: Home / Self Care | Payer: Medicare Other | Source: Ambulatory Visit | Attending: Cardiovascular Disease | Admitting: Cardiovascular Disease

## 2013-10-05 ENCOUNTER — Encounter (HOSPITAL_COMMUNITY)
Admission: RE | Admit: 2013-10-05 | Discharge: 2013-10-05 | Disposition: A | Discharge status: Home / Self Care | Payer: Medicare Other | Source: Ambulatory Visit | Attending: Cardiovascular Disease | Admitting: Cardiovascular Disease

## 2013-10-08 ENCOUNTER — Encounter (HOSPITAL_COMMUNITY)
Admission: RE | Admit: 2013-10-08 | Discharge: 2013-10-08 | Disposition: A | Discharge status: Home / Self Care | Payer: Medicare Other | Source: Ambulatory Visit | Attending: Cardiovascular Disease | Admitting: Cardiovascular Disease

## 2013-10-10 ENCOUNTER — Encounter (HOSPITAL_COMMUNITY)
Admission: RE | Admit: 2013-10-10 | Discharge: 2013-10-10 | Disposition: A | Discharge status: Home / Self Care | Payer: Medicare Other | Source: Ambulatory Visit | Attending: Cardiovascular Disease | Admitting: Cardiovascular Disease

## 2013-10-12 ENCOUNTER — Encounter (HOSPITAL_COMMUNITY)
Admission: RE | Admit: 2013-10-12 | Discharge: 2013-10-12 | Disposition: A | Discharge status: Home / Self Care | Payer: Medicare Other | Source: Ambulatory Visit | Attending: Cardiovascular Disease | Admitting: Cardiovascular Disease

## 2013-10-12 DIAGNOSIS — Z5189 Encounter for other specified aftercare: Secondary | ICD-10-CM | POA: Insufficient documentation

## 2013-10-12 DIAGNOSIS — I209 Angina pectoris, unspecified: Secondary | ICD-10-CM | POA: Insufficient documentation

## 2013-10-15 ENCOUNTER — Encounter (HOSPITAL_COMMUNITY)
Admission: RE | Admit: 2013-10-15 | Discharge: 2013-10-15 | Disposition: A | Discharge status: Home / Self Care | Payer: Medicare Other | Source: Ambulatory Visit | Attending: Cardiovascular Disease | Admitting: Cardiovascular Disease

## 2013-10-17 ENCOUNTER — Encounter (HOSPITAL_COMMUNITY)
Admission: RE | Admit: 2013-10-17 | Discharge: 2013-10-17 | Disposition: A | Discharge status: Home / Self Care | Payer: Medicare Other | Source: Ambulatory Visit | Attending: Cardiovascular Disease | Admitting: Cardiovascular Disease

## 2013-10-26 DIAGNOSIS — Z6826 Body mass index (BMI) 26.0-26.9, adult: Secondary | ICD-10-CM | POA: Diagnosis not present

## 2013-10-26 DIAGNOSIS — Z79899 Other long term (current) drug therapy: Secondary | ICD-10-CM | POA: Diagnosis not present

## 2013-10-26 DIAGNOSIS — I2510 Atherosclerotic heart disease of native coronary artery without angina pectoris: Secondary | ICD-10-CM | POA: Diagnosis not present

## 2013-10-26 DIAGNOSIS — G4700 Insomnia, unspecified: Secondary | ICD-10-CM | POA: Diagnosis not present

## 2013-10-26 DIAGNOSIS — F411 Generalized anxiety disorder: Secondary | ICD-10-CM | POA: Diagnosis not present

## 2013-10-30 NOTE — Progress Notes (Signed)
Cardiac Rehabilitation Program Outcomes Report   Orientation:  07/17/2013 !st week report 07/27/2013 Graduate Date:  tbd Discharge Date:  tbd # of sessions completed: 3 DX; Stable Angina/CAD/CHF  Cardiologist: Ronald Lobo MD:  Noreene Larsson Time:  09:30  A.  Exercise Program:  Tolerates exercise @ 3.77 METS for 15 minutes and Walk Test Results:  Pre: Pre Walk Test: Resting HR 71, BP 150/68: O2 96%, RPE 7 and RPD 7, 6 min HR 80, BP 146/68, O2 94%,RPE11 and RPD 11, Post HR 58,, BP 138/70 O2 98%, RPE 8 and RPD 8. Walked 915ft at 1.7 mph at 2.3 METS  B.  Mental Health:  Good mental attitude  C.  Education/Instruction/Skills  Accurately checks own pulse.  Rest:  67  Exercise 104, Knows THR for exercise and Uses Perceived Exertion Scale and/or Dyspnea Scale  Uses Perceived Exertion Scale and/or Dyspnea Scale  D.  Nutrition/Weight Control/Body Composition:  Adherence to prescribed nutrition program: good    E.  Blood Lipids    Lab Results  Component Value Date   CHOL  Value: 132        ATP III CLASSIFICATION:  <200     mg/dL   Desirable  200-239  mg/dL   Borderline High  >=240    mg/dL   High        07/05/2010   HDL 19* 07/05/2010   LDLCALC  Value: 94        Total Cholesterol/HDL:CHD Risk Coronary Heart Disease Risk Table                     Men   Women  1/2 Average Risk   3.4   3.3  Average Risk       5.0   4.4  2 X Average Risk   9.6   7.1  3 X Average Risk  23.4   11.0        Use the calculated Patient Ratio above and the CHD Risk Table to determine the patient's CHD Risk.        ATP III CLASSIFICATION (LDL):  <100     mg/dL   Optimal  100-129  mg/dL   Near or Above                    Optimal  130-159  mg/dL   Borderline  160-189  mg/dL   High  >190     mg/dL   Very High 07/05/2010   TRIG 93 07/05/2010   CHOLHDL 6.9 07/05/2010    F.  Lifestyle Changes:  Making positive lifestyle changes and Not smoking:  Quit 1982  G.  Symptoms noted with  exercise:  Asymptomatic  Report Completed By:  Oletta Lamas. Gresia Isidoro RN   Comments:  This is patients 1st week report. He achieved a peak METS of 3.77. His resting HR was 67 and resting BP is 122/58 and peak HR is 104 and Peak BP was 130/50. He has done well his first week a report will follow upon his 18th visit his halfway point in the program

## 2013-10-30 NOTE — Addendum Note (Signed)
Encounter addended by: Norlene Duel, RN on: 10/30/2013  8:02 AM<BR>     Documentation filed: Clinical Notes

## 2013-10-30 NOTE — Addendum Note (Signed)
Encounter addended by: Norlene Duel, RN on: 10/30/2013  8:01 AM<BR>     Documentation filed: Notes Section

## 2013-10-30 NOTE — Progress Notes (Signed)
Cardiac Rehabilitation Program Outcomes Report   Orientation:  07/17/2013 Graduate Date:  10/17/2013 Discharge Date:  NA # of sessions completed: 66 DX: Stable Angina/ CAD/CHF  Cardiologist: Samella Parr Family MD:  Elsie Lincoln Class Time:  09:30  A.  Exercise Program:  Tolerates exercise @ 3.77 METS for 15 minutes and Walk Test Results:  Pre: Post walk Test: Post Resting HR  60,BP  98/50, O2 95%, RPE 6 and RPD 6, 6 min HR 76, BP 102/50, O2 94%,RPE 9 and RPD 9. Post HR 61,BP 90/50 , O2 94% RPE 6 and RPD 6. Walked 900 ft at 1.79 mph at 2.37 METS.  B.  Mental Health:  Good mental attitude  C.  Education/Instruction/Skills  Accurately checks own pulse.  Rest:  62  Exercise:  91, Knows THR for exercise, Uses Perceived Exertion Scale and/or Dyspnea Scale and Attended 13 education classes  Uses Perceived Exertion Scale and/or Dyspnea Scale  D.  Nutrition/Weight Control/Body Composition:  Adherence to prescribed nutrition program: good    E.  Blood Lipids    Lab Results  Component Value Date   CHOL  Value: 132        ATP III CLASSIFICATION:  <200     mg/dL   Desirable  200-239  mg/dL   Borderline High  >=240    mg/dL   High        07/05/2010   HDL 19* 07/05/2010   LDLCALC  Value: 94        Total Cholesterol/HDL:CHD Risk Coronary Heart Disease Risk Table                     Men   Women  1/2 Average Risk   3.4   3.3  Average Risk       5.0   4.4  2 X Average Risk   9.6   7.1  3 X Average Risk  23.4   11.0        Use the calculated Patient Ratio above and the CHD Risk Table to determine the patient's CHD Risk.        ATP III CLASSIFICATION (LDL):  <100     mg/dL   Optimal  100-129  mg/dL   Near or Above                    Optimal  130-159  mg/dL   Borderline  160-189  mg/dL   High  >190     mg/dL   Very High 07/05/2010   TRIG 93 07/05/2010   CHOLHDL 6.9 07/05/2010    F.  Lifestyle Changes:  Making positive lifestyle changes and Not smoking:  Quit 1982  G.   Symptoms noted with exercise:  Asymptomatic  Report Completed By:  Oletta Lamas. Rayson Rando RN   Comments:  This is patients graduation report. His resting HR was 62 and resting BP was 118/72, His peak HR was 91 and Peak BP 140/60. He has done very well while in rehab. He plans to continue exercising by walking and going to the [facility]. A call will be made at 1 month 6 months and 1year to confirm compliance.

## 2013-10-30 NOTE — Addendum Note (Signed)
Encounter addended by: Norlene Duel, RN on: 10/30/2013  7:52 AM<BR>     Documentation filed: Notes Section

## 2013-10-30 NOTE — Addendum Note (Signed)
Encounter addended by: Norlene Duel, RN on: 10/30/2013  7:52 AM<BR>     Documentation filed: Clinical Notes

## 2013-10-30 NOTE — Progress Notes (Signed)
Cardiac Rehabilitation Program Outcomes Report   Orientation:  07/17/2013 Halfway report: 08/31/2013 Graduate Date:  tbd Discharge Date:  tbd # of sessions completed: 18 DX: Stable Angina/ CAD/CHF  Cardiologist: Samella Parr Family MD:  Elsie Lincoln Class Time:  09:30  A.  Exercise Program:  Tolerates exercise @ 3.77 METS for 15 minutes  B.  Mental Health:  Good mental attitude  C.  Education/Instruction/Skills  Accurately checks own pulse.  Rest:  62  Exercise: 85, Knows THR for exercise and Uses Perceived Exertion Scale and/or Dyspnea Scale  Uses Perceived Exertion Scale and/or Dyspnea Scale  D.  Nutrition/Weight Control/Body Composition:  Adherence to prescribed nutrition program: good    E.  Blood Lipids    Lab Results  Component Value Date   CHOL  Value: 132        ATP III CLASSIFICATION:  <200     mg/dL   Desirable  200-239  mg/dL   Borderline High  >=240    mg/dL   High        07/05/2010   HDL 19* 07/05/2010   LDLCALC  Value: 94        Total Cholesterol/HDL:CHD Risk Coronary Heart Disease Risk Table                     Men   Women  1/2 Average Risk   3.4   3.3  Average Risk       5.0   4.4  2 X Average Risk   9.6   7.1  3 X Average Risk  23.4   11.0        Use the calculated Patient Ratio above and the CHD Risk Table to determine the patient's CHD Risk.        ATP III CLASSIFICATION (LDL):  <100     mg/dL   Optimal  100-129  mg/dL   Near or Above                    Optimal  130-159  mg/dL   Borderline  160-189  mg/dL   High  >190     mg/dL   Very High 07/05/2010   TRIG 93 07/05/2010   CHOLHDL 6.9 07/05/2010    F.  Lifestyle Changes:  Making positive lifestyle changes and Not smoking:  Quit 1982  G.  Symptoms noted with exercise:  Asymptomatic  Report Completed By:  Oletta Lamas. Oral Remache RN   Comments:  This is patients halfway report. He has done well while in Rehab. His resting HR was 62 and resting BP was 110/58. His peak HR was 85 and peak BP  was 128/58. A graduation report will follow upon his graduation.

## 2013-12-10 DIAGNOSIS — C649 Malignant neoplasm of unspecified kidney, except renal pelvis: Secondary | ICD-10-CM | POA: Diagnosis not present

## 2013-12-10 DIAGNOSIS — N320 Bladder-neck obstruction: Secondary | ICD-10-CM | POA: Diagnosis not present

## 2013-12-10 DIAGNOSIS — N400 Enlarged prostate without lower urinary tract symptoms: Secondary | ICD-10-CM | POA: Diagnosis not present

## 2013-12-10 DIAGNOSIS — E119 Type 2 diabetes mellitus without complications: Secondary | ICD-10-CM | POA: Diagnosis not present

## 2013-12-31 DIAGNOSIS — D472 Monoclonal gammopathy: Secondary | ICD-10-CM | POA: Diagnosis not present

## 2014-01-03 DIAGNOSIS — L570 Actinic keratosis: Secondary | ICD-10-CM | POA: Diagnosis not present

## 2014-01-03 DIAGNOSIS — C44621 Squamous cell carcinoma of skin of unspecified upper limb, including shoulder: Secondary | ICD-10-CM | POA: Diagnosis not present

## 2014-01-03 DIAGNOSIS — D235 Other benign neoplasm of skin of trunk: Secondary | ICD-10-CM | POA: Diagnosis not present

## 2014-01-07 DIAGNOSIS — I259 Chronic ischemic heart disease, unspecified: Secondary | ICD-10-CM | POA: Diagnosis not present

## 2014-01-07 DIAGNOSIS — I5032 Chronic diastolic (congestive) heart failure: Secondary | ICD-10-CM | POA: Diagnosis not present

## 2014-01-07 DIAGNOSIS — R040 Epistaxis: Secondary | ICD-10-CM | POA: Diagnosis not present

## 2014-01-07 DIAGNOSIS — N183 Chronic kidney disease, stage 3 unspecified: Secondary | ICD-10-CM | POA: Diagnosis not present

## 2014-01-07 DIAGNOSIS — J449 Chronic obstructive pulmonary disease, unspecified: Secondary | ICD-10-CM | POA: Diagnosis not present

## 2014-01-07 DIAGNOSIS — I209 Angina pectoris, unspecified: Secondary | ICD-10-CM | POA: Diagnosis not present

## 2014-01-07 DIAGNOSIS — E785 Hyperlipidemia, unspecified: Secondary | ICD-10-CM | POA: Diagnosis not present

## 2014-01-07 DIAGNOSIS — R0602 Shortness of breath: Secondary | ICD-10-CM | POA: Diagnosis not present

## 2014-01-07 DIAGNOSIS — D472 Monoclonal gammopathy: Secondary | ICD-10-CM | POA: Diagnosis not present

## 2014-01-07 DIAGNOSIS — I10 Essential (primary) hypertension: Secondary | ICD-10-CM | POA: Diagnosis not present

## 2014-01-07 DIAGNOSIS — E119 Type 2 diabetes mellitus without complications: Secondary | ICD-10-CM | POA: Diagnosis not present

## 2014-01-22 DIAGNOSIS — I959 Hypotension, unspecified: Secondary | ICD-10-CM | POA: Diagnosis not present

## 2014-01-22 DIAGNOSIS — N183 Chronic kidney disease, stage 3 unspecified: Secondary | ICD-10-CM | POA: Diagnosis not present

## 2014-01-22 DIAGNOSIS — I5022 Chronic systolic (congestive) heart failure: Secondary | ICD-10-CM | POA: Diagnosis not present

## 2014-01-22 DIAGNOSIS — I5032 Chronic diastolic (congestive) heart failure: Secondary | ICD-10-CM | POA: Diagnosis not present

## 2014-01-28 DIAGNOSIS — L259 Unspecified contact dermatitis, unspecified cause: Secondary | ICD-10-CM | POA: Diagnosis not present

## 2014-01-28 DIAGNOSIS — I2510 Atherosclerotic heart disease of native coronary artery without angina pectoris: Secondary | ICD-10-CM | POA: Diagnosis not present

## 2014-01-28 DIAGNOSIS — I959 Hypotension, unspecified: Secondary | ICD-10-CM | POA: Diagnosis not present

## 2014-01-28 DIAGNOSIS — Z6825 Body mass index (BMI) 25.0-25.9, adult: Secondary | ICD-10-CM | POA: Diagnosis not present

## 2014-01-28 DIAGNOSIS — R5381 Other malaise: Secondary | ICD-10-CM | POA: Diagnosis not present

## 2014-01-28 DIAGNOSIS — R5383 Other fatigue: Secondary | ICD-10-CM | POA: Diagnosis not present

## 2014-01-31 DIAGNOSIS — Z85828 Personal history of other malignant neoplasm of skin: Secondary | ICD-10-CM | POA: Diagnosis not present

## 2014-01-31 DIAGNOSIS — L570 Actinic keratosis: Secondary | ICD-10-CM | POA: Diagnosis not present

## 2014-01-31 DIAGNOSIS — L259 Unspecified contact dermatitis, unspecified cause: Secondary | ICD-10-CM | POA: Diagnosis not present

## 2014-02-27 DIAGNOSIS — I2510 Atherosclerotic heart disease of native coronary artery without angina pectoris: Secondary | ICD-10-CM | POA: Diagnosis not present

## 2014-02-27 DIAGNOSIS — N179 Acute kidney failure, unspecified: Secondary | ICD-10-CM | POA: Diagnosis not present

## 2014-02-27 DIAGNOSIS — I5022 Chronic systolic (congestive) heart failure: Secondary | ICD-10-CM | POA: Diagnosis not present

## 2014-02-27 DIAGNOSIS — I959 Hypotension, unspecified: Secondary | ICD-10-CM | POA: Diagnosis not present

## 2014-06-03 DIAGNOSIS — R5381 Other malaise: Secondary | ICD-10-CM | POA: Diagnosis not present

## 2014-06-03 DIAGNOSIS — I209 Angina pectoris, unspecified: Secondary | ICD-10-CM | POA: Diagnosis not present

## 2014-06-03 DIAGNOSIS — I509 Heart failure, unspecified: Secondary | ICD-10-CM | POA: Diagnosis not present

## 2014-06-03 DIAGNOSIS — M79609 Pain in unspecified limb: Secondary | ICD-10-CM | POA: Diagnosis not present

## 2014-06-03 DIAGNOSIS — Y33XXXA Other specified events, undetermined intent, initial encounter: Secondary | ICD-10-CM | POA: Diagnosis not present

## 2014-06-03 DIAGNOSIS — N183 Chronic kidney disease, stage 3 unspecified: Secondary | ICD-10-CM | POA: Diagnosis not present

## 2014-06-03 DIAGNOSIS — I2510 Atherosclerotic heart disease of native coronary artery without angina pectoris: Secondary | ICD-10-CM | POA: Diagnosis not present

## 2014-06-03 DIAGNOSIS — M5137 Other intervertebral disc degeneration, lumbosacral region: Secondary | ICD-10-CM | POA: Diagnosis not present

## 2014-06-03 DIAGNOSIS — I129 Hypertensive chronic kidney disease with stage 1 through stage 4 chronic kidney disease, or unspecified chronic kidney disease: Secondary | ICD-10-CM | POA: Diagnosis not present

## 2014-06-03 DIAGNOSIS — R29898 Other symptoms and signs involving the musculoskeletal system: Secondary | ICD-10-CM | POA: Diagnosis not present

## 2014-06-03 DIAGNOSIS — I10 Essential (primary) hypertension: Secondary | ICD-10-CM | POA: Diagnosis not present

## 2014-06-03 DIAGNOSIS — R269 Unspecified abnormalities of gait and mobility: Secondary | ICD-10-CM | POA: Diagnosis not present

## 2014-06-03 DIAGNOSIS — J449 Chronic obstructive pulmonary disease, unspecified: Secondary | ICD-10-CM | POA: Diagnosis not present

## 2014-06-03 DIAGNOSIS — R918 Other nonspecific abnormal finding of lung field: Secondary | ICD-10-CM | POA: Diagnosis not present

## 2014-06-03 DIAGNOSIS — I2589 Other forms of chronic ischemic heart disease: Secondary | ICD-10-CM | POA: Diagnosis not present

## 2014-06-03 DIAGNOSIS — I5022 Chronic systolic (congestive) heart failure: Secondary | ICD-10-CM | POA: Diagnosis not present

## 2014-06-03 DIAGNOSIS — R079 Chest pain, unspecified: Secondary | ICD-10-CM | POA: Diagnosis not present

## 2014-06-03 DIAGNOSIS — Z043 Encounter for examination and observation following other accident: Secondary | ICD-10-CM | POA: Diagnosis not present

## 2014-06-03 DIAGNOSIS — M51379 Other intervertebral disc degeneration, lumbosacral region without mention of lumbar back pain or lower extremity pain: Secondary | ICD-10-CM | POA: Diagnosis not present

## 2014-06-03 DIAGNOSIS — D472 Monoclonal gammopathy: Secondary | ICD-10-CM | POA: Diagnosis not present

## 2014-06-03 DIAGNOSIS — R935 Abnormal findings on diagnostic imaging of other abdominal regions, including retroperitoneum: Secondary | ICD-10-CM | POA: Diagnosis not present

## 2014-06-04 DIAGNOSIS — N183 Chronic kidney disease, stage 3 unspecified: Secondary | ICD-10-CM | POA: Diagnosis present

## 2014-06-04 DIAGNOSIS — M7989 Other specified soft tissue disorders: Secondary | ICD-10-CM | POA: Diagnosis not present

## 2014-06-04 DIAGNOSIS — J449 Chronic obstructive pulmonary disease, unspecified: Secondary | ICD-10-CM | POA: Diagnosis present

## 2014-06-04 DIAGNOSIS — N189 Chronic kidney disease, unspecified: Secondary | ICD-10-CM | POA: Diagnosis present

## 2014-06-04 DIAGNOSIS — M79609 Pain in unspecified limb: Secondary | ICD-10-CM | POA: Diagnosis not present

## 2014-06-04 DIAGNOSIS — I509 Heart failure, unspecified: Secondary | ICD-10-CM | POA: Diagnosis present

## 2014-06-04 DIAGNOSIS — I252 Old myocardial infarction: Secondary | ICD-10-CM | POA: Diagnosis not present

## 2014-06-04 DIAGNOSIS — E039 Hypothyroidism, unspecified: Secondary | ICD-10-CM | POA: Diagnosis present

## 2014-06-04 DIAGNOSIS — I5022 Chronic systolic (congestive) heart failure: Secondary | ICD-10-CM | POA: Diagnosis present

## 2014-06-04 DIAGNOSIS — I209 Angina pectoris, unspecified: Secondary | ICD-10-CM | POA: Diagnosis not present

## 2014-06-04 DIAGNOSIS — I129 Hypertensive chronic kidney disease with stage 1 through stage 4 chronic kidney disease, or unspecified chronic kidney disease: Secondary | ICD-10-CM | POA: Diagnosis present

## 2014-06-04 DIAGNOSIS — I10 Essential (primary) hypertension: Secondary | ICD-10-CM | POA: Diagnosis not present

## 2014-06-04 DIAGNOSIS — R29898 Other symptoms and signs involving the musculoskeletal system: Secondary | ICD-10-CM | POA: Diagnosis not present

## 2014-06-04 DIAGNOSIS — D472 Monoclonal gammopathy: Secondary | ICD-10-CM | POA: Diagnosis present

## 2014-06-04 DIAGNOSIS — I2589 Other forms of chronic ischemic heart disease: Secondary | ICD-10-CM | POA: Diagnosis present

## 2014-06-04 DIAGNOSIS — R1909 Other intra-abdominal and pelvic swelling, mass and lump: Secondary | ICD-10-CM | POA: Diagnosis present

## 2014-06-04 DIAGNOSIS — I2510 Atherosclerotic heart disease of native coronary artery without angina pectoris: Secondary | ICD-10-CM | POA: Diagnosis not present

## 2014-06-04 DIAGNOSIS — E119 Type 2 diabetes mellitus without complications: Secondary | ICD-10-CM | POA: Diagnosis present

## 2014-06-07 DIAGNOSIS — M79662 Pain in left lower leg: Secondary | ICD-10-CM | POA: Diagnosis not present

## 2014-06-07 DIAGNOSIS — E119 Type 2 diabetes mellitus without complications: Secondary | ICD-10-CM | POA: Diagnosis not present

## 2014-06-07 DIAGNOSIS — J449 Chronic obstructive pulmonary disease, unspecified: Secondary | ICD-10-CM | POA: Diagnosis not present

## 2014-06-07 DIAGNOSIS — R2689 Other abnormalities of gait and mobility: Secondary | ICD-10-CM | POA: Diagnosis not present

## 2014-06-12 DIAGNOSIS — R2689 Other abnormalities of gait and mobility: Secondary | ICD-10-CM | POA: Diagnosis not present

## 2014-06-12 DIAGNOSIS — J449 Chronic obstructive pulmonary disease, unspecified: Secondary | ICD-10-CM | POA: Diagnosis not present

## 2014-06-12 DIAGNOSIS — E119 Type 2 diabetes mellitus without complications: Secondary | ICD-10-CM | POA: Diagnosis not present

## 2014-06-12 DIAGNOSIS — M79662 Pain in left lower leg: Secondary | ICD-10-CM | POA: Diagnosis not present

## 2014-06-14 DIAGNOSIS — J449 Chronic obstructive pulmonary disease, unspecified: Secondary | ICD-10-CM | POA: Diagnosis not present

## 2014-06-14 DIAGNOSIS — R2689 Other abnormalities of gait and mobility: Secondary | ICD-10-CM | POA: Diagnosis not present

## 2014-06-14 DIAGNOSIS — M79662 Pain in left lower leg: Secondary | ICD-10-CM | POA: Diagnosis not present

## 2014-06-14 DIAGNOSIS — E119 Type 2 diabetes mellitus without complications: Secondary | ICD-10-CM | POA: Diagnosis not present

## 2014-06-21 DIAGNOSIS — E119 Type 2 diabetes mellitus without complications: Secondary | ICD-10-CM | POA: Diagnosis not present

## 2014-06-21 DIAGNOSIS — J449 Chronic obstructive pulmonary disease, unspecified: Secondary | ICD-10-CM | POA: Diagnosis not present

## 2014-06-21 DIAGNOSIS — R2689 Other abnormalities of gait and mobility: Secondary | ICD-10-CM | POA: Diagnosis not present

## 2014-06-21 DIAGNOSIS — M79662 Pain in left lower leg: Secondary | ICD-10-CM | POA: Diagnosis not present

## 2014-06-21 DIAGNOSIS — Z6825 Body mass index (BMI) 25.0-25.9, adult: Secondary | ICD-10-CM | POA: Diagnosis not present

## 2014-06-26 DIAGNOSIS — J449 Chronic obstructive pulmonary disease, unspecified: Secondary | ICD-10-CM | POA: Diagnosis not present

## 2014-06-26 DIAGNOSIS — M79662 Pain in left lower leg: Secondary | ICD-10-CM | POA: Diagnosis not present

## 2014-06-26 DIAGNOSIS — E119 Type 2 diabetes mellitus without complications: Secondary | ICD-10-CM | POA: Diagnosis not present

## 2014-06-26 DIAGNOSIS — R2689 Other abnormalities of gait and mobility: Secondary | ICD-10-CM | POA: Diagnosis not present

## 2014-07-01 ENCOUNTER — Other Ambulatory Visit: Payer: Self-pay | Admitting: Urology

## 2014-07-01 DIAGNOSIS — N138 Other obstructive and reflux uropathy: Secondary | ICD-10-CM | POA: Diagnosis not present

## 2014-07-01 DIAGNOSIS — C642 Malignant neoplasm of left kidney, except renal pelvis: Secondary | ICD-10-CM

## 2014-07-01 DIAGNOSIS — N403 Nodular prostate with lower urinary tract symptoms: Secondary | ICD-10-CM | POA: Diagnosis not present

## 2014-07-01 DIAGNOSIS — C649 Malignant neoplasm of unspecified kidney, except renal pelvis: Secondary | ICD-10-CM | POA: Diagnosis not present

## 2014-07-02 DIAGNOSIS — R2689 Other abnormalities of gait and mobility: Secondary | ICD-10-CM | POA: Diagnosis not present

## 2014-07-02 DIAGNOSIS — J449 Chronic obstructive pulmonary disease, unspecified: Secondary | ICD-10-CM | POA: Diagnosis not present

## 2014-07-02 DIAGNOSIS — E119 Type 2 diabetes mellitus without complications: Secondary | ICD-10-CM | POA: Diagnosis not present

## 2014-07-02 DIAGNOSIS — M79662 Pain in left lower leg: Secondary | ICD-10-CM | POA: Diagnosis not present

## 2014-07-03 DIAGNOSIS — D472 Monoclonal gammopathy: Secondary | ICD-10-CM | POA: Diagnosis not present

## 2014-07-03 DIAGNOSIS — D3A00 Benign carcinoid tumor of unspecified site: Secondary | ICD-10-CM | POA: Diagnosis not present

## 2014-07-05 DIAGNOSIS — J449 Chronic obstructive pulmonary disease, unspecified: Secondary | ICD-10-CM | POA: Diagnosis not present

## 2014-07-05 DIAGNOSIS — E119 Type 2 diabetes mellitus without complications: Secondary | ICD-10-CM | POA: Diagnosis not present

## 2014-07-05 DIAGNOSIS — M79662 Pain in left lower leg: Secondary | ICD-10-CM | POA: Diagnosis not present

## 2014-07-05 DIAGNOSIS — R2689 Other abnormalities of gait and mobility: Secondary | ICD-10-CM | POA: Diagnosis not present

## 2014-07-08 DIAGNOSIS — R2689 Other abnormalities of gait and mobility: Secondary | ICD-10-CM | POA: Diagnosis not present

## 2014-07-08 DIAGNOSIS — E119 Type 2 diabetes mellitus without complications: Secondary | ICD-10-CM | POA: Diagnosis not present

## 2014-07-08 DIAGNOSIS — J449 Chronic obstructive pulmonary disease, unspecified: Secondary | ICD-10-CM | POA: Diagnosis not present

## 2014-07-08 DIAGNOSIS — M79662 Pain in left lower leg: Secondary | ICD-10-CM | POA: Diagnosis not present

## 2014-07-09 ENCOUNTER — Ambulatory Visit
Admission: RE | Admit: 2014-07-09 | Discharge: 2014-07-09 | Disposition: A | Discharge status: Home / Self Care | Payer: Medicare Other | Source: Ambulatory Visit | Attending: Urology | Admitting: Urology

## 2014-07-09 DIAGNOSIS — C642 Malignant neoplasm of left kidney, except renal pelvis: Secondary | ICD-10-CM

## 2014-07-09 DIAGNOSIS — Z85528 Personal history of other malignant neoplasm of kidney: Secondary | ICD-10-CM | POA: Diagnosis not present

## 2014-07-09 DIAGNOSIS — N281 Cyst of kidney, acquired: Secondary | ICD-10-CM | POA: Diagnosis not present

## 2014-07-10 DIAGNOSIS — M79662 Pain in left lower leg: Secondary | ICD-10-CM | POA: Diagnosis not present

## 2014-07-10 DIAGNOSIS — E119 Type 2 diabetes mellitus without complications: Secondary | ICD-10-CM | POA: Diagnosis not present

## 2014-07-10 DIAGNOSIS — R2689 Other abnormalities of gait and mobility: Secondary | ICD-10-CM | POA: Diagnosis not present

## 2014-07-10 DIAGNOSIS — J449 Chronic obstructive pulmonary disease, unspecified: Secondary | ICD-10-CM | POA: Diagnosis not present

## 2014-07-17 DIAGNOSIS — E119 Type 2 diabetes mellitus without complications: Secondary | ICD-10-CM | POA: Diagnosis not present

## 2014-07-17 DIAGNOSIS — M79662 Pain in left lower leg: Secondary | ICD-10-CM | POA: Diagnosis not present

## 2014-07-17 DIAGNOSIS — J449 Chronic obstructive pulmonary disease, unspecified: Secondary | ICD-10-CM | POA: Diagnosis not present

## 2014-07-17 DIAGNOSIS — R2689 Other abnormalities of gait and mobility: Secondary | ICD-10-CM | POA: Diagnosis not present

## 2014-07-19 DIAGNOSIS — J449 Chronic obstructive pulmonary disease, unspecified: Secondary | ICD-10-CM | POA: Diagnosis not present

## 2014-07-19 DIAGNOSIS — E119 Type 2 diabetes mellitus without complications: Secondary | ICD-10-CM | POA: Diagnosis not present

## 2014-07-19 DIAGNOSIS — R2689 Other abnormalities of gait and mobility: Secondary | ICD-10-CM | POA: Diagnosis not present

## 2014-07-19 DIAGNOSIS — M79662 Pain in left lower leg: Secondary | ICD-10-CM | POA: Diagnosis not present

## 2014-07-23 DIAGNOSIS — E119 Type 2 diabetes mellitus without complications: Secondary | ICD-10-CM | POA: Diagnosis not present

## 2014-07-23 DIAGNOSIS — R2689 Other abnormalities of gait and mobility: Secondary | ICD-10-CM | POA: Diagnosis not present

## 2014-07-23 DIAGNOSIS — M79662 Pain in left lower leg: Secondary | ICD-10-CM | POA: Diagnosis not present

## 2014-07-23 DIAGNOSIS — J449 Chronic obstructive pulmonary disease, unspecified: Secondary | ICD-10-CM | POA: Diagnosis not present

## 2014-07-24 DIAGNOSIS — I10 Essential (primary) hypertension: Secondary | ICD-10-CM | POA: Diagnosis not present

## 2014-07-24 DIAGNOSIS — I209 Angina pectoris, unspecified: Secondary | ICD-10-CM | POA: Diagnosis not present

## 2014-07-24 DIAGNOSIS — R0609 Other forms of dyspnea: Secondary | ICD-10-CM | POA: Diagnosis not present

## 2014-07-24 DIAGNOSIS — R040 Epistaxis: Secondary | ICD-10-CM | POA: Diagnosis not present

## 2014-07-24 DIAGNOSIS — M79662 Pain in left lower leg: Secondary | ICD-10-CM | POA: Diagnosis not present

## 2014-07-24 DIAGNOSIS — I25118 Atherosclerotic heart disease of native coronary artery with other forms of angina pectoris: Secondary | ICD-10-CM | POA: Diagnosis not present

## 2014-07-24 DIAGNOSIS — J439 Emphysema, unspecified: Secondary | ICD-10-CM | POA: Diagnosis not present

## 2014-07-24 DIAGNOSIS — R2689 Other abnormalities of gait and mobility: Secondary | ICD-10-CM | POA: Diagnosis not present

## 2014-07-24 DIAGNOSIS — E119 Type 2 diabetes mellitus without complications: Secondary | ICD-10-CM | POA: Diagnosis not present

## 2014-07-24 DIAGNOSIS — J449 Chronic obstructive pulmonary disease, unspecified: Secondary | ICD-10-CM | POA: Diagnosis not present

## 2014-07-24 DIAGNOSIS — I5022 Chronic systolic (congestive) heart failure: Secondary | ICD-10-CM | POA: Diagnosis not present

## 2014-07-31 DIAGNOSIS — M79662 Pain in left lower leg: Secondary | ICD-10-CM | POA: Diagnosis not present

## 2014-07-31 DIAGNOSIS — R2689 Other abnormalities of gait and mobility: Secondary | ICD-10-CM | POA: Diagnosis not present

## 2014-07-31 DIAGNOSIS — E119 Type 2 diabetes mellitus without complications: Secondary | ICD-10-CM | POA: Diagnosis not present

## 2014-07-31 DIAGNOSIS — J449 Chronic obstructive pulmonary disease, unspecified: Secondary | ICD-10-CM | POA: Diagnosis not present

## 2014-08-02 DIAGNOSIS — M79662 Pain in left lower leg: Secondary | ICD-10-CM | POA: Diagnosis not present

## 2014-08-02 DIAGNOSIS — J449 Chronic obstructive pulmonary disease, unspecified: Secondary | ICD-10-CM | POA: Diagnosis not present

## 2014-08-02 DIAGNOSIS — E119 Type 2 diabetes mellitus without complications: Secondary | ICD-10-CM | POA: Diagnosis not present

## 2014-08-02 DIAGNOSIS — R2689 Other abnormalities of gait and mobility: Secondary | ICD-10-CM | POA: Diagnosis not present

## 2014-08-05 DIAGNOSIS — M79662 Pain in left lower leg: Secondary | ICD-10-CM | POA: Diagnosis not present

## 2014-08-05 DIAGNOSIS — J449 Chronic obstructive pulmonary disease, unspecified: Secondary | ICD-10-CM | POA: Diagnosis not present

## 2014-08-05 DIAGNOSIS — R2689 Other abnormalities of gait and mobility: Secondary | ICD-10-CM | POA: Diagnosis not present

## 2014-08-05 DIAGNOSIS — E119 Type 2 diabetes mellitus without complications: Secondary | ICD-10-CM | POA: Diagnosis not present

## 2014-08-21 DIAGNOSIS — H26493 Other secondary cataract, bilateral: Secondary | ICD-10-CM | POA: Diagnosis not present

## 2014-09-06 DIAGNOSIS — J40 Bronchitis, not specified as acute or chronic: Secondary | ICD-10-CM | POA: Diagnosis not present

## 2014-09-06 DIAGNOSIS — J020 Streptococcal pharyngitis: Secondary | ICD-10-CM | POA: Diagnosis not present

## 2014-09-06 DIAGNOSIS — J988 Other specified respiratory disorders: Secondary | ICD-10-CM | POA: Diagnosis not present

## 2014-09-09 DIAGNOSIS — D3A00 Benign carcinoid tumor of unspecified site: Secondary | ICD-10-CM | POA: Diagnosis not present

## 2014-09-10 DIAGNOSIS — Z6825 Body mass index (BMI) 25.0-25.9, adult: Secondary | ICD-10-CM | POA: Diagnosis not present

## 2014-09-10 DIAGNOSIS — A493 Mycoplasma infection, unspecified site: Secondary | ICD-10-CM | POA: Diagnosis not present

## 2014-09-10 DIAGNOSIS — J069 Acute upper respiratory infection, unspecified: Secondary | ICD-10-CM | POA: Diagnosis not present

## 2014-09-11 DIAGNOSIS — D3A00 Benign carcinoid tumor of unspecified site: Secondary | ICD-10-CM | POA: Diagnosis not present

## 2014-09-16 DIAGNOSIS — D472 Monoclonal gammopathy: Secondary | ICD-10-CM | POA: Diagnosis not present

## 2014-09-16 DIAGNOSIS — R591 Generalized enlarged lymph nodes: Secondary | ICD-10-CM | POA: Diagnosis not present

## 2014-09-16 DIAGNOSIS — D3A00 Benign carcinoid tumor of unspecified site: Secondary | ICD-10-CM | POA: Diagnosis not present

## 2014-11-21 DIAGNOSIS — Z23 Encounter for immunization: Secondary | ICD-10-CM | POA: Diagnosis not present

## 2014-11-27 DIAGNOSIS — Z08 Encounter for follow-up examination after completed treatment for malignant neoplasm: Secondary | ICD-10-CM | POA: Diagnosis not present

## 2014-11-27 DIAGNOSIS — X32XXXD Exposure to sunlight, subsequent encounter: Secondary | ICD-10-CM | POA: Diagnosis not present

## 2014-11-27 DIAGNOSIS — C44529 Squamous cell carcinoma of skin of other part of trunk: Secondary | ICD-10-CM | POA: Diagnosis not present

## 2014-11-27 DIAGNOSIS — D225 Melanocytic nevi of trunk: Secondary | ICD-10-CM | POA: Diagnosis not present

## 2014-11-27 DIAGNOSIS — Z85828 Personal history of other malignant neoplasm of skin: Secondary | ICD-10-CM | POA: Diagnosis not present

## 2014-11-27 DIAGNOSIS — L570 Actinic keratosis: Secondary | ICD-10-CM | POA: Diagnosis not present

## 2014-12-03 DIAGNOSIS — M79676 Pain in unspecified toe(s): Secondary | ICD-10-CM | POA: Diagnosis not present

## 2014-12-03 DIAGNOSIS — B351 Tinea unguium: Secondary | ICD-10-CM | POA: Diagnosis not present

## 2014-12-04 ENCOUNTER — Observation Stay (HOSPITAL_COMMUNITY): Payer: Medicare Other

## 2014-12-04 ENCOUNTER — Emergency Department (HOSPITAL_COMMUNITY): Payer: Medicare Other

## 2014-12-04 ENCOUNTER — Encounter (HOSPITAL_COMMUNITY): Payer: Self-pay | Admitting: *Deleted

## 2014-12-04 ENCOUNTER — Other Ambulatory Visit: Payer: Self-pay

## 2014-12-04 ENCOUNTER — Observation Stay (HOSPITAL_COMMUNITY)
Admission: EM | Admit: 2014-12-04 | Discharge: 2014-12-05 | Disposition: A | Discharge status: Home / Self Care | Payer: Medicare Other | Attending: Internal Medicine | Admitting: Internal Medicine

## 2014-12-04 DIAGNOSIS — J439 Emphysema, unspecified: Secondary | ICD-10-CM | POA: Diagnosis not present

## 2014-12-04 DIAGNOSIS — R2689 Other abnormalities of gait and mobility: Secondary | ICD-10-CM | POA: Diagnosis not present

## 2014-12-04 DIAGNOSIS — Z87448 Personal history of other diseases of urinary system: Secondary | ICD-10-CM | POA: Diagnosis not present

## 2014-12-04 DIAGNOSIS — G459 Transient cerebral ischemic attack, unspecified: Secondary | ICD-10-CM | POA: Diagnosis not present

## 2014-12-04 DIAGNOSIS — I519 Heart disease, unspecified: Secondary | ICD-10-CM | POA: Insufficient documentation

## 2014-12-04 DIAGNOSIS — Z79899 Other long term (current) drug therapy: Secondary | ICD-10-CM | POA: Insufficient documentation

## 2014-12-04 DIAGNOSIS — E039 Hypothyroidism, unspecified: Secondary | ICD-10-CM | POA: Diagnosis present

## 2014-12-04 DIAGNOSIS — I2510 Atherosclerotic heart disease of native coronary artery without angina pectoris: Secondary | ICD-10-CM | POA: Diagnosis not present

## 2014-12-04 DIAGNOSIS — I252 Old myocardial infarction: Secondary | ICD-10-CM | POA: Diagnosis not present

## 2014-12-04 DIAGNOSIS — I2583 Coronary atherosclerosis due to lipid rich plaque: Secondary | ICD-10-CM

## 2014-12-04 DIAGNOSIS — I739 Peripheral vascular disease, unspecified: Secondary | ICD-10-CM

## 2014-12-04 DIAGNOSIS — J8410 Pulmonary fibrosis, unspecified: Secondary | ICD-10-CM | POA: Diagnosis not present

## 2014-12-04 DIAGNOSIS — I10 Essential (primary) hypertension: Secondary | ICD-10-CM | POA: Diagnosis present

## 2014-12-04 DIAGNOSIS — E785 Hyperlipidemia, unspecified: Secondary | ICD-10-CM | POA: Insufficient documentation

## 2014-12-04 DIAGNOSIS — I779 Disorder of arteries and arterioles, unspecified: Secondary | ICD-10-CM

## 2014-12-04 DIAGNOSIS — H538 Other visual disturbances: Secondary | ICD-10-CM | POA: Diagnosis present

## 2014-12-04 DIAGNOSIS — R42 Dizziness and giddiness: Secondary | ICD-10-CM | POA: Diagnosis not present

## 2014-12-04 DIAGNOSIS — Z87891 Personal history of nicotine dependence: Secondary | ICD-10-CM | POA: Diagnosis not present

## 2014-12-04 LAB — DIFFERENTIAL
Basophils Absolute: 0 10*3/uL (ref 0.0–0.1)
Basophils Relative: 0 % (ref 0–1)
EOS ABS: 0 10*3/uL (ref 0.0–0.7)
Eosinophils Relative: 1 % (ref 0–5)
Lymphocytes Relative: 45 % (ref 12–46)
Lymphs Abs: 2.2 10*3/uL (ref 0.7–4.0)
MONO ABS: 0.6 10*3/uL (ref 0.1–1.0)
Monocytes Relative: 13 % — ABNORMAL HIGH (ref 3–12)
Neutro Abs: 1.9 10*3/uL (ref 1.7–7.7)
Neutrophils Relative %: 41 % — ABNORMAL LOW (ref 43–77)

## 2014-12-04 LAB — COMPREHENSIVE METABOLIC PANEL
ALBUMIN: 3.9 g/dL (ref 3.5–5.2)
ALT: 16 U/L (ref 0–53)
ANION GAP: 2 — AB (ref 5–15)
AST: 26 U/L (ref 0–37)
Alkaline Phosphatase: 44 U/L (ref 39–117)
BILIRUBIN TOTAL: 1.1 mg/dL (ref 0.3–1.2)
BUN: 30 mg/dL — AB (ref 6–23)
CO2: 29 mmol/L (ref 19–32)
CREATININE: 1.77 mg/dL — AB (ref 0.50–1.35)
Calcium: 9.2 mg/dL (ref 8.4–10.5)
Chloride: 106 mmol/L (ref 96–112)
GFR calc Af Amer: 38 mL/min — ABNORMAL LOW (ref >90)
GFR calc non Af Amer: 33 mL/min — ABNORMAL LOW (ref >90)
Glucose, Bld: 134 mg/dL — ABNORMAL HIGH (ref 70–99)
Potassium: 3.6 mmol/L (ref 3.5–5.1)
Sodium: 137 mmol/L (ref 135–145)
Total Protein: 7.6 g/dL (ref 6.0–8.3)

## 2014-12-04 LAB — I-STAT CHEM 8, ED
BUN: 29 mg/dL — ABNORMAL HIGH (ref 6–23)
Calcium, Ion: 1.2 mmol/L (ref 1.13–1.30)
Chloride: 101 mmol/L (ref 96–112)
Creatinine, Ser: 1.8 mg/dL — ABNORMAL HIGH (ref 0.50–1.35)
Glucose, Bld: 135 mg/dL — ABNORMAL HIGH (ref 70–99)
HCT: 38 % — ABNORMAL LOW (ref 39.0–52.0)
HEMOGLOBIN: 12.9 g/dL — AB (ref 13.0–17.0)
POTASSIUM: 3.7 mmol/L (ref 3.5–5.1)
SODIUM: 142 mmol/L (ref 135–145)
TCO2: 25 mmol/L (ref 0–100)

## 2014-12-04 LAB — CBC
HCT: 36.7 % — ABNORMAL LOW (ref 39.0–52.0)
HEMOGLOBIN: 12.4 g/dL — AB (ref 13.0–17.0)
MCH: 29.8 pg (ref 26.0–34.0)
MCHC: 33.8 g/dL (ref 30.0–36.0)
MCV: 88.2 fL (ref 78.0–100.0)
Platelets: 129 10*3/uL — ABNORMAL LOW (ref 150–400)
RBC: 4.16 MIL/uL — AB (ref 4.22–5.81)
RDW: 14.4 % (ref 11.5–15.5)
WBC: 4.8 10*3/uL (ref 4.0–10.5)

## 2014-12-04 LAB — URINALYSIS, ROUTINE W REFLEX MICROSCOPIC
Bilirubin Urine: NEGATIVE
GLUCOSE, UA: NEGATIVE mg/dL
Hgb urine dipstick: NEGATIVE
Ketones, ur: NEGATIVE mg/dL
LEUKOCYTES UA: NEGATIVE
NITRITE: NEGATIVE
Protein, ur: NEGATIVE mg/dL
Specific Gravity, Urine: 1.01 (ref 1.005–1.030)
Urobilinogen, UA: 0.2 mg/dL (ref 0.0–1.0)
pH: 6 (ref 5.0–8.0)

## 2014-12-04 LAB — RAPID URINE DRUG SCREEN, HOSP PERFORMED
Amphetamines: NOT DETECTED
Barbiturates: NOT DETECTED
Benzodiazepines: NOT DETECTED
COCAINE: NOT DETECTED
Opiates: NOT DETECTED
Tetrahydrocannabinol: NOT DETECTED

## 2014-12-04 LAB — PROTIME-INR
INR: 1.17 (ref 0.00–1.49)
Prothrombin Time: 15 seconds (ref 11.6–15.2)

## 2014-12-04 LAB — CBG MONITORING, ED: GLUCOSE-CAPILLARY: 137 mg/dL — AB (ref 70–99)

## 2014-12-04 LAB — APTT: APTT: 36 s (ref 24–37)

## 2014-12-04 LAB — I-STAT TROPONIN, ED: Troponin i, poc: 0.01 ng/mL (ref 0.00–0.08)

## 2014-12-04 LAB — ETHANOL: Alcohol, Ethyl (B): <5 mg/dL (ref 0–9)

## 2014-12-04 MED ORDER — ZOLPIDEM TARTRATE 5 MG PO TABS: 10.0000 mg | ORAL_TABLET | Freq: Every evening | ORAL | Status: DC | PRN

## 2014-12-04 MED ORDER — ENOXAPARIN SODIUM 30 MG/0.3ML ~~LOC~~ SOLN
30.0000 mg | SUBCUTANEOUS | Status: DC
  Administered 2014-12-04: 30 mg via SUBCUTANEOUS
  Filled 2014-12-04: qty 0.3

## 2014-12-04 MED ORDER — TAMSULOSIN HCL 0.4 MG PO CAPS: 0.4000 mg | ORAL_CAPSULE | Freq: Every day | ORAL | Status: DC

## 2014-12-04 MED ORDER — CARVEDILOL 3.125 MG PO TABS
6.2500 mg | ORAL_TABLET | Freq: Two times a day (BID) | ORAL | Status: DC
  Administered 2014-12-05 (×2): 6.25 mg via ORAL
  Filled 2014-12-04: qty 2
  Filled 2014-12-04 (×2): qty 1
  Filled 2014-12-04: qty 2
  Filled 2014-12-04: qty 1

## 2014-12-04 MED ORDER — SENNOSIDES-DOCUSATE SODIUM 8.6-50 MG PO TABS
1.0000 | ORAL_TABLET | Freq: Every evening | ORAL | Status: DC | PRN
  Filled 2014-12-04: qty 1

## 2014-12-04 MED ORDER — NITROGLYCERIN 0.4 MG SL SUBL: 0.4000 mg | SUBLINGUAL_TABLET | SUBLINGUAL | Status: DC | PRN

## 2014-12-04 MED ORDER — CARVEDILOL 3.125 MG PO TABS
ORAL_TABLET | ORAL | Status: AC
  Filled 2014-12-04: qty 2

## 2014-12-04 MED ORDER — NITROGLYCERIN 0.6 MG/HR TD PT24
0.6000 mg | MEDICATED_PATCH | Freq: Every day | TRANSDERMAL | Status: DC
  Administered 2014-12-05: 0.6 mg via TRANSDERMAL
  Filled 2014-12-04 (×3): qty 1

## 2014-12-04 MED ORDER — PRAVASTATIN SODIUM 10 MG PO TABS
20.0000 mg | ORAL_TABLET | Freq: Every day | ORAL | Status: DC
  Filled 2014-12-04 (×2): qty 1

## 2014-12-04 MED ORDER — CARVEDILOL 6.25 MG PO TABS
6.2500 mg | ORAL_TABLET | Freq: Two times a day (BID) | ORAL | Status: AC
  Administered 2014-12-04: 6.25 mg via ORAL
  Filled 2014-12-04: qty 1

## 2014-12-04 MED ORDER — VITAMIN B-12 1000 MCG PO TABS
1000.0000 ug | ORAL_TABLET | Freq: Every day | ORAL | Status: DC
  Administered 2014-12-05: 1000 ug via ORAL
  Filled 2014-12-04 (×3): qty 1

## 2014-12-04 MED ORDER — FUROSEMIDE 40 MG PO TABS
40.0000 mg | ORAL_TABLET | Freq: Every day | ORAL | Status: DC
  Administered 2014-12-05: 40 mg via ORAL
  Filled 2014-12-04: qty 1

## 2014-12-04 MED ORDER — LEVOTHYROXINE SODIUM 25 MCG PO TABS: 125.0000 ug | ORAL_TABLET | Freq: Every day | ORAL | Status: DC

## 2014-12-04 MED ORDER — TIOTROPIUM BROMIDE MONOHYDRATE 18 MCG IN CAPS
18.0000 ug | ORAL_CAPSULE | Freq: Every day | RESPIRATORY_TRACT | Status: DC
Start: 2014-12-05 — End: 2014-12-05
  Filled 2014-12-04 (×2): qty 5

## 2014-12-04 MED ORDER — STROKE: EARLY STAGES OF RECOVERY BOOK
Freq: Once | Status: DC
  Filled 2014-12-04 (×2): qty 1

## 2014-12-04 MED ORDER — ALPRAZOLAM 0.5 MG PO TABS: 0.5000 mg | ORAL_TABLET | Freq: Every evening | ORAL | Status: DC | PRN

## 2014-12-04 MED ORDER — IPRATROPIUM-ALBUTEROL 0.5-2.5 (3) MG/3ML IN SOLN: 3.0000 mL | Freq: Two times a day (BID) | RESPIRATORY_TRACT | Status: DC | PRN

## 2014-12-04 NOTE — ED Notes (Signed)
Pt states vision changes in both eyes that became blurry while driving around [id number], back to normal per pt at present, denies pain at present, nausea earlier but denies at present, wife states pt had foaming from mouth earlier today

## 2014-12-04 NOTE — ED Notes (Signed)
Hospitalist at bedside 

## 2014-12-04 NOTE — ED Provider Notes (Signed)
CSN: [id number]     Arrival date & time 12/04/14  1847 History   First MD Initiated Contact with Patient 12/04/14 1907     Chief Complaint  Patient presents with  . Blurred Vision     (Consider location/radiation/quality/duration/timing/severity/associated sxs/prior Treatment) HPI   Brian Mcmillan is a 81 y.o. male who was well today, until he was driving home with his granddaughter, after he picked her from school.  He suddenly noticed bright flashing lights in both eyes, that obscured his visual fields bilaterally.  He was able to continue driving with his granddaughter's assistance admitted about a half mile home without causing an accident.  He felt weak at that time, so went and sat down.  He then noticed that he was drooling from both sides of his mouth.  About that time.  He checked his blood pressure and it was low at 80/30 with a pulse of 53.  He kept checking the pressure every 15 minutes or so, and by 525 it was 129/56 with a pulse of 61.  About an hour later, he had an episode of 10 minutes of pain in his left temple.  Based on all of these findings.  His family members thought he should come here for evaluation.  He came here by private B Landry Mellow, able to walk is normal, using his walker.  He has never had symptoms like this before.  He has not had any recent illnesses.  Today he got up a little later than usual, at noon.  At about 1:30 he had lunch at a [employer].  At about 3 PM, he took his usual morning medications.  He denies recent fever, chills, nausea, vomiting, cough, shortness of breath, chest pain or back pain.  He is taking his usual medications as prescribed.  There are no other known modifying factors.   Past Medical History  Diagnosis Date  . Hypertension     no RAS  . Dyslipidemia   . Hypothyroidism   . Chest pain     myoview 2006 no ischemia/ myoview 2009 no ischemia  . CAD (coronary artery disease)     cath 09/23/09 70% Dx1 (diffuse disease), 40% Cx, Large  RCA 80-90% with heavy calcification / cath 06/2010 no change tiht RCA still best to treat medially with nosebleeds  . Left ventricular dysfunction     myoview 2006 normal / myoview 2009 normal / EF 60% cath 09/2009, EF 60% cath 06/2010  . Systolic click     no mitral valve prolapse  . Nosebleed     significant, can not take ASA  . Kidney mass 2008    radiofrequency ablation at [facility]  . Abdominal mass 2008    chronic inflammatory mass of the mesentary Dr [name]  . Memory impairment     memory decreasing 06/2010  . Rash Sept 2011    rash on buttocks, hospitalized hydralazine stopped but then restarted w/o return of rash  . Past heart attack     X2   Past Surgical History  Procedure Laterality Date  . Lymph node removal      right abdomen  . Lymph node removal      robotic laser @ [facility]  on left side of stomach  . Rfa right kidney    . Back surgery    . Bone marrow aspiration      X2  . Bone marrow biopsy      X2  . Colonoscopy N/A 03/07/2013  Procedure: COLONOSCOPY;  Surgeon: [name], MD;  Location: [facility];  Service: Endoscopy;  Laterality: N/A;  830-moved to 40 Ann to notify pt   Family History  Problem Relation Age of Onset  . Coronary artery disease    . Cancer Maternal Grandfather    History  Substance Use Topics  . Smoking status: Former Smoker -- 0.50 packs/day for 42 years    Types: Cigarettes    Quit date: 10/11/1980  . Smokeless tobacco: Never Used  . Alcohol Use: No    Review of Systems  All other systems reviewed and are negative.     Allergies  Aspirin and Contrast media  Home Medications   Prior to Admission medications   Medication Sig Start Date End Date Taking? Authorizing Provider  ALPRAZolam Duanne Moron) 0.5 MG tablet Take 0.5 mg by mouth at bedtime as needed for anxiety.   Yes Historical Provider, MD  Cholecalciferol (VITAMIN D3) 5000 UNITS CAPS Take 5,000 Units by mouth daily.    Yes Historical Provider, MD  levothyroxine  (SYNTHROID, LEVOTHROID) 125 MCG tablet Take 125 mcg by mouth daily.    Yes Historical Provider, MD  montelukast (SINGULAIR) 10 MG tablet Take 10 mg by mouth at bedtime.    Yes Historical Provider, MD  multivitamin-iron-minerals-folic acid (THERAPEUTIC-M) TABS tablet Take 1 tablet by mouth daily.   Yes Historical Provider, MD  nitroGLYCERIN (NITRODUR - DOSED IN MG/24 HR) 0.4 mg/hr Place 0.6 mg onto the skin daily.    Yes Historical Provider, MD  pravastatin (PRAVACHOL) 20 MG tablet Take 20 mg by mouth at bedtime. 12/28/12  Yes Historical Provider, MD  Tamsulosin HCl (FLOMAX) 0.4 MG CAPS Take 0.4 mg by mouth at bedtime.    Yes Historical Provider, MD  tiotropium (SPIRIVA) 18 MCG inhalation capsule Place 18 mcg into inhaler and inhale daily.   Yes Historical Provider, MD  vitamin B-12 (CYANOCOBALAMIN) 1000 MCG tablet Take 1,000 mcg by mouth daily.     Yes Historical Provider, MD  zolpidem (AMBIEN) 10 MG tablet Take 10 mg by mouth at bedtime as needed for sleep.   Yes Historical Provider, MD  albuterol-ipratropium (COMBIVENT) 18-103 MCG/ACT inhaler Inhale 2-4 puffs into the lungs 2 (two) times daily as needed for wheezing.     Historical Provider, MD  amLODipine (NORVASC) 5 MG tablet Take 2.5 mg by mouth 2 (two) times daily.     Historical Provider, MD  carvedilol (COREG) 6.25 MG tablet Take 6.25 mg by mouth 2 (two) times daily with a meal.      Historical Provider, MD  fish oil-omega-3 fatty acids 1000 MG capsule Take 1 capsule by mouth daily.      Historical Provider, MD  losartan (COZAAR) 100 MG tablet Take 1 tablet (100 mg total) by mouth daily. 07/19/11   Carlena Bjornstad, MD  Multiple Vitamin (MULTIVITAMIN) tablet Take 1 tablet by mouth daily.      Historical Provider, MD  nitroGLYCERIN (NITROSTAT) 0.4 MG SL tablet Place 1 tablet (0.4 mg total) under the tongue as needed. Patient taking differently: Place 0.4 mg under the tongue as needed for chest pain.  08/19/11   Carlena Bjornstad, MD   BP 150/114  mmHg  Pulse 60  Temp(Src) 97.9 F (36.6 C) (Oral)  Resp 20  Ht _0  (1.88 m)  Wt 190 lb (86.183 kg)  BMI 24.38 kg/m2  SpO2 95% Physical Exam  Constitutional: He is oriented to person, place, and time. He appears well-developed.  Elderly, frail  HENT:  Head: Normocephalic and atraumatic.  Right Ear: External ear normal.  Left Ear: External ear normal.  Eyes: Conjunctivae and EOM are normal. Pupils are equal, round, and reactive to light.  Neck: Normal range of motion and phonation normal. Neck supple.  Cardiovascular: Normal rate, regular rhythm and normal heart sounds.   Pulmonary/Chest: Effort normal and breath sounds normal. He exhibits no bony tenderness.  Abdominal: Soft. There is no tenderness.  Musculoskeletal: Normal range of motion. He exhibits no edema or tenderness.  Neurological: He is alert and oriented to person, place, and time. No cranial nerve deficit or sensory deficit. He exhibits normal muscle tone. Coordination normal.  No dysarthria and aphasia or nystagmus.  Normal finger-to-nose and heel-to-shin, bilaterally.  He has a mild intention tremor, bilaterally.  Skin: Skin is warm, dry and intact.  Psychiatric: He has a normal mood and affect. His behavior is normal. Judgment and thought content normal.  Nursing note and vitals reviewed.   ED Course  Procedures (including critical care time)  Medications - No data to display  Patient Vitals for the past 24 hrs:  BP Temp Temp src Pulse Resp SpO2 Height Weight  12/04/14 1904 (!) 150/114 mmHg 97.9 F (36.6 C) Oral 60 20 95 % _0  (1.88 m) 190 lb (86.183 kg)  12/04/14 1900 167/63 mmHg - - (!) 57 - 94 % - -    10:10 PM Reevaluation with update and discussion. After initial assessment and treatment, an updated evaluation reveals clinical exam unchanged.  Findings discussed with patient and family members. Amere Bricco L    10:16 PM-Consult complete with Hospitalist. Patient case explained and discussed. He  agrees to admit patient for further evaluation and treatment. Call ended at 22:22  Labs Review Labs Reviewed  CBG MONITORING, ED - Abnormal; Notable for the following:    Glucose-Capillary 137 (*)    All other components within normal limits  ETHANOL  PROTIME-INR  APTT  CBC  DIFFERENTIAL  COMPREHENSIVE METABOLIC PANEL  URINE RAPID DRUG SCREEN (HOSP PERFORMED)  URINALYSIS, ROUTINE W REFLEX MICROSCOPIC  CBG MONITORING, ED  I-STAT CHEM 8, ED  I-STAT TROPOININ, ED  I-STAT TROPOININ, ED    Imaging Review No results found.   EKG Interpretation None        Date: 12/04/14- MUSE hyperlinh is inactive  Rate: 58  Rhythm: indeterminate  QRS Axis: normal  PR and QT Intervals: QT normal  ST/T Wave abnormalities: nonspecific T wave abnormality  PR and QRS Conduction Disutrbances:none  Narrative Interpretation:   Old EKG Reviewed: changes noted    MDM   Final diagnoses:  Transient cerebral ischemia, unspecified transient cerebral ischemia type    Episode of scotomata, associated with hypotension.  Symptoms nonspecific, and accompanied by a period of not being able to control his creations.  There is concern for TIA, but no persistent symptoms concerning for CVA. The EKG is nondiagnostic for rhythm.  No history of atrial fibrillation.  Patient will need admission for evaluation of stroke syndrome with TIA.   Nursing Notes Reviewed/ Care Coordinated, and agree without changes. Applicable Imaging Reviewed.  Interpretation of Laboratory Data incorporated into ED treatment  Plan: Admit        Richarda Blade, MD 12/04/14 2224

## 2014-12-04 NOTE — H&P (Signed)
Hospitalist Admission History and Physical  Patient name: Brian Mcmillan Medical record number: [id number] Date of birth: Oct 28, 1924 Age: 81 y.o. Gender: male  Primary Care Provider: Leonides Grills, MD  Chief Complaint: TIA   History of Present Illness:This is a 81 y.o. year old male with significant past medical history of CAD s/p MI, HTN, hypothyroidism, CKD, MGUS presenting with ?TIA. Pt states that he had 2 episodes of blurred vision while driving. Episode lasted approx 15 minutes per pt. Pt had his granddaughter help guide him while driving. Mild headache. No hemiparesis, confusion. Noted baseline hx/o CAD. Not on ASA/plavix 2/2 to bleeding issues per pt.  Presented to the ER afebrile.BP 150s-210s/80s-110s. Cr 1.8. Glu 135. Head CT w/ no acute abnormalities-noted chronic microvascular disease. Vision improved per pt.   Assessment and Plan: Brian Mcmillan is a 81 y.o. year old male presenting with TIA   Active Problems:   TIA (transient ischemic attack)   1-TIA -proceed down TIA pathway w/ MRI/MRA, 2D ECHO, carotid dopplers, risk stratification labs -hold ASA given hx/o bleeding per pt  -neuro consult in am/as clinically indicated   2-HTN -elevated BP on presentation -resume home regimen  -avoid >25% decrease in baseline BP so as to avoid hypoperfusion  3-CAD -no active CP currently  -prn NTG -tele bed   4-CKD  -Cr at baseline  -follow  FEN/GI: NPO pending bedside swallow eval. PPI Prophylaxis: lovenox  Disposition: pending further evaluation Code Status:Full Code    Patient Active Problem List   Diagnosis Date Noted  . TIA (transient ischemic attack) 12/04/2014  . MGUS (monoclonal gammopathy of unknown significance) 03/29/2011  . Renal cancer 03/29/2011    Class: Diagnosis of  . Shortness of breath 12/24/2010  . EDEMA 11/17/2010  . HEADACHE 10/28/2009  . CAD 09/25/2009  . NOSEBLEED 09/22/2009  . HYPOTHYROIDISM 06/29/2009  . DYSLIPIDEMIA 06/29/2009   . HYPERTENSION 06/29/2009  . CHEST PAIN 06/29/2009   Past Medical History: Past Medical History  Diagnosis Date  . Hypertension     no RAS  . Dyslipidemia   . Hypothyroidism   . Chest pain     myoview 2006 no ischemia/ myoview 2009 no ischemia  . CAD (coronary artery disease)     cath 09/23/09 70% Dx1 (diffuse disease), 40% Cx, Large RCA 80-90% with heavy calcification / cath 06/2010 no change tiht RCA still best to treat medially with nosebleeds  . Left ventricular dysfunction     myoview 2006 normal / myoview 2009 normal / EF 60% cath 09/2009, EF 60% cath 06/2010  . Systolic click     no mitral valve prolapse  . Nosebleed     significant, can not take ASA  . Kidney mass 2008    radiofrequency ablation at [facility]  . Abdominal mass 2008    chronic inflammatory mass of the mesentary Dr [name]  . Memory impairment     memory decreasing 06/2010  . Rash Sept 2011    rash on buttocks, hospitalized hydralazine stopped but then restarted w/o return of rash  . Past heart attack     X2    Past Surgical History: Past Surgical History  Procedure Laterality Date  . Lymph node removal      right abdomen  . Lymph node removal      robotic laser @ [facility]  on left side of stomach  . Rfa right kidney    . Back surgery    . Bone marrow aspiration  X2  . Bone marrow biopsy      X2  . Colonoscopy N/A 03/07/2013    Procedure: COLONOSCOPY;  Surgeon: [name], MD;  Location: [facility];  Service: Endoscopy;  Laterality: N/A;  830-moved to 71 Ann to notify pt    Social History: History   Social History  . Marital Status: Married    Spouse Name: N/A  . Number of Children: N/A  . Years of Education: N/A   Occupational History  . Retired    Social History Main Topics  . Smoking status: Former Smoker -- 0.50 packs/day for 42 years    Types: Cigarettes    Quit date: 10/11/1980  . Smokeless tobacco: Never Used  . Alcohol Use: No  . Drug Use: Not on file  .  Sexual Activity: Not on file   Other Topics Concern  . None   Social History Narrative    Family History: Family History  Problem Relation Age of Onset  . Coronary artery disease    . Cancer Maternal Grandfather     Allergies: Allergies  Allergen Reactions  . Ranolazine Shortness Of Breath  . Simvastatin Other (See Comments)    fatigue  . Aspirin Other (See Comments)    Causes severe bleeding  . Contrast Media [Iodinated Diagnostic Agents] Other (See Comments)    Will shut kidneys down    Current Facility-Administered Medications  Medication Dose Route Frequency Provider Last Rate Last Dose  .  stroke: mapping our early stages of recovery book   Does not apply Once Shanda Howells, MD      . albuterol-ipratropium (COMBIVENT) inhaler 2-4 puff  2-4 puff Inhalation BID PRN Shanda Howells, MD      . ALPRAZolam Duanne Moron) tablet 0.5 mg  0.5 mg Oral QHS PRN Shanda Howells, MD      . Derrill Memo ON 12/05/2014] carvedilol (COREG) tablet 6.25 mg  6.25 mg Oral BID WC Shanda Howells, MD      . enoxaparin (LOVENOX) injection 30 mg  30 mg Subcutaneous Q24H Shanda Howells, MD      . Derrill Memo ON 12/05/2014] furosemide (LASIX) tablet 40 mg  40 mg Oral Daily Shanda Howells, MD      . Derrill Memo ON 12/05/2014] levothyroxine (SYNTHROID, LEVOTHROID) tablet 125 mcg  125 mcg Oral Daily Shanda Howells, MD      . Derrill Memo ON 12/05/2014] nitroGLYCERIN (NITRODUR - Dosed in mg/24 hr) patch 0.6 mg  0.6 mg Transdermal Daily Shanda Howells, MD      . nitroGLYCERIN (NITROSTAT) SL tablet 0.4 mg  0.4 mg Sublingual PRN Shanda Howells, MD      . pravastatin (PRAVACHOL) tablet 20 mg  20 mg Oral QHS Shanda Howells, MD      . senna-docusate (Senokot-S) tablet 1 tablet  1 tablet Oral QHS PRN Shanda Howells, MD      . tamsulosin [facility]) capsule 0.4 mg  0.4 mg Oral QHS Shanda Howells, MD      . Derrill Memo ON 12/05/2014] tiotropium (SPIRIVA) inhalation capsule 18 mcg  18 mcg Inhalation Daily Shanda Howells, MD      . Derrill Memo ON 12/05/2014] vitamin B-12  (CYANOCOBALAMIN) tablet 1,000 mcg  1,000 mcg Oral Daily Shanda Howells, MD      . zolpidem (AMBIEN) tablet 10 mg  10 mg Oral QHS PRN Shanda Howells, MD       Current Outpatient Prescriptions  Medication Sig Dispense Refill  . albuterol-ipratropium (COMBIVENT) 18-103 MCG/ACT inhaler Inhale 2-4 puffs into the lungs 2 (two) times  daily as needed for wheezing.     Marland Kitchen ALPRAZolam (XANAX) 0.5 MG tablet Take 0.5 mg by mouth at bedtime as needed for anxiety.    . carvedilol (COREG) 6.25 MG tablet Take 6.25 mg by mouth 2 (two) times daily with a meal.      . Cholecalciferol (VITAMIN D3) 5000 UNITS CAPS Take 5,000 Units by mouth daily.     . furosemide (LASIX) 40 MG tablet Take 40 mg by mouth daily.    Marland Kitchen levothyroxine (SYNTHROID, LEVOTHROID) 125 MCG tablet Take 125 mcg by mouth daily.     . montelukast (SINGULAIR) 10 MG tablet Take 10 mg by mouth at bedtime.     . multivitamin-iron-minerals-folic acid (THERAPEUTIC-M) TABS tablet Take 1 tablet by mouth daily.    . nitroGLYCERIN (NITRODUR - DOSED IN MG/24 HR) 0.4 mg/hr Place 0.6 mg onto the skin daily.     . pravastatin (PRAVACHOL) 20 MG tablet Take 20 mg by mouth at bedtime.    . Tamsulosin HCl (FLOMAX) 0.4 MG CAPS Take 0.4 mg by mouth at bedtime.     Marland Kitchen tiotropium (SPIRIVA) 18 MCG inhalation capsule Place 18 mcg into inhaler and inhale daily.    . vitamin B-12 (CYANOCOBALAMIN) 1000 MCG tablet Take 1,000 mcg by mouth daily.      Marland Kitchen zolpidem (AMBIEN) 10 MG tablet Take 10 mg by mouth at bedtime as needed for sleep.    Marland Kitchen losartan (COZAAR) 100 MG tablet Take 1 tablet (100 mg total) by mouth daily. (Patient not taking: Reported on 12/04/2014) 30 tablet 3  . nitroGLYCERIN (NITROSTAT) 0.4 MG SL tablet Place 1 tablet (0.4 mg total) under the tongue as needed. (Patient taking differently: Place 0.4 mg under the tongue as needed for chest pain. ) 25 tablet 6   Review Of Systems: 12 point ROS negative except as noted above in HPI.  Physical Exam: Filed Vitals:    12/04/14 2230  BP: 211/84  Pulse: 58  Temp:   Resp: 16    General: alert and cooperative HEENT: PERRLA and extra ocular movement intact Heart: S1, S2 normal, no murmur, rub or gallop, regular rate and rhythm Lungs: clear to auscultation, no wheezes or rales and unlabored breathing Abdomen: abdomen is soft without significant tenderness, masses, organomegaly or guarding Extremities: extremities normal, atraumatic, no cyanosis or edema Skin:no rashes Neurology: normal without focal findings  Labs and Imaging: Lab Results  Component Value Date/Time   NA 142 12/04/2014 08:13 PM   K 3.7 12/04/2014 08:13 PM   CL 101 12/04/2014 08:13 PM   CO2 29 12/04/2014 08:00 PM   BUN 29* 12/04/2014 08:13 PM   CREATININE 1.80* 12/04/2014 08:13 PM   GLUCOSE 135* 12/04/2014 08:13 PM   Lab Results  Component Value Date   WBC 4.8 12/04/2014   HGB 12.9* 12/04/2014   HCT 38.0* 12/04/2014   MCV 88.2 12/04/2014   PLT 129* 12/04/2014    Ct Head Wo Contrast  12/04/2014   CLINICAL DATA:  81 year old male with dizziness and blurred vision  EXAM: CT HEAD WITHOUT CONTRAST  TECHNIQUE: Contiguous axial images were obtained from the base of the skull through the vertex without intravenous contrast.  COMPARISON:  07/04/2010  FINDINGS: Unremarkable appearance of the calvarium without acute fracture or aggressive lesion.  Unremarkable appearance of the scalp soft tissues.  Unremarkable appearance of the bilateral orbits.  Right mastoid air cells are clear. Trace fluid within the inferior left mastoid air cells.  No significant paranasal sinus disease  No  CT evidence of acute intracranial hemorrhage, midline shift, or mass effect.  Gray-white differentiation is maintained.  Unchanged configuration of the ventricles.  Mild senescent brain volume loss, similar to prior. Periventricular hypodensities, particularly of the right parietal region, similar to comparison. Confluent hypodensities overlying the frontal horns.   Calcifications of the anterior circulation.  IMPRESSION: No CT evidence of acute intracranial abnormality.  Re- demonstration of mild brain volume loss and evidence of chronic microvascular ischemic disease with associated intracranial atherosclerosis.  Trace fluid within the inferior left mastoid air cells again present.  Signed,  Dulcy Fanny. Earleen Newport, DO  [facility]  [facility]   Electronically Signed   By: [name] D.O.   On: 12/04/2014 21:12           Shanda Howells MD  Pager: [phone]

## 2014-12-04 NOTE — ED Notes (Signed)
Pt reports blurred vision and headache about 3:30 this afternoon.  Pt reports that he hadn't had anything to eat or taken his medications until that time.  Pt does admit to not being compliant with taking medications on regular schedule as directed.

## 2014-12-05 ENCOUNTER — Observation Stay (HOSPITAL_COMMUNITY): Payer: Medicare Other

## 2014-12-05 ENCOUNTER — Encounter (HOSPITAL_COMMUNITY): Payer: Self-pay | Admitting: *Deleted

## 2014-12-05 DIAGNOSIS — I739 Peripheral vascular disease, unspecified: Secondary | ICD-10-CM

## 2014-12-05 DIAGNOSIS — I6622 Occlusion and stenosis of left posterior cerebral artery: Secondary | ICD-10-CM | POA: Diagnosis not present

## 2014-12-05 DIAGNOSIS — I779 Disorder of arteries and arterioles, unspecified: Secondary | ICD-10-CM | POA: Diagnosis not present

## 2014-12-05 DIAGNOSIS — I10 Essential (primary) hypertension: Secondary | ICD-10-CM

## 2014-12-05 DIAGNOSIS — E039 Hypothyroidism, unspecified: Secondary | ICD-10-CM

## 2014-12-05 DIAGNOSIS — G453 Amaurosis fugax: Secondary | ICD-10-CM | POA: Diagnosis not present

## 2014-12-05 DIAGNOSIS — I6523 Occlusion and stenosis of bilateral carotid arteries: Secondary | ICD-10-CM | POA: Diagnosis not present

## 2014-12-05 DIAGNOSIS — G459 Transient cerebral ischemic attack, unspecified: Secondary | ICD-10-CM

## 2014-12-05 LAB — LIPID PANEL
CHOLESTEROL: 105 mg/dL (ref 0–200)
HDL: 26 mg/dL — ABNORMAL LOW (ref >39)
LDL Cholesterol: 43 mg/dL (ref 0–99)
TRIGLYCERIDES: 182 mg/dL — AB (ref <150)
Total CHOL/HDL Ratio: 4 RATIO
VLDL: 36 mg/dL (ref 0–40)

## 2014-12-05 MED ORDER — LEVOTHYROXINE SODIUM 25 MCG PO TABS
125.0000 ug | ORAL_TABLET | Freq: Every day | ORAL | Status: DC
  Administered 2014-12-05: 125 ug via ORAL
  Filled 2014-12-05 (×2): qty 1

## 2014-12-05 MED ORDER — ENOXAPARIN SODIUM 40 MG/0.4ML ~~LOC~~ SOLN: 40.0000 mg | SUBCUTANEOUS | Status: DC

## 2014-12-05 MED ORDER — ZOLPIDEM TARTRATE 5 MG PO TABS: 5.0000 mg | ORAL_TABLET | Freq: Every evening | ORAL | Status: DC | PRN

## 2014-12-05 NOTE — Progress Notes (Signed)
*  PRELIMINARY RESULTS* Echocardiogram 2D Echocardiogram has been performed.  Leavy Cella 12/05/2014, 3:09 PM

## 2014-12-05 NOTE — Progress Notes (Signed)
UR completed 

## 2014-12-05 NOTE — Progress Notes (Signed)
SLP Cancellation Note  Patient Details Name: Brian Mcmillan MRN: [id number] DOB: 22-Jun-1925   Cancelled treatment:       Reason Eval/Treat Not Completed: SLP screened, no needs identified, will sign off; Pt and wife report cognitive linguistic skills at baseline status.  Thank you,  Genene Churn, [place]    Laie 12/05/2014, 3:42 PM

## 2014-12-05 NOTE — Evaluation (Signed)
Physical Therapy Evaluation Patient Details Name: Brian Mcmillan MRN: [id number] DOB: Oct 07, 1925 Today's Date: 12/05/2014   History of Present Illness  History of Present Illness:This is a 81 y.o. year old male with significant past medical history of CAD s/p MI, HTN, hypothyroidism, CKD, MGUS presenting with ?TIA. Pt states that he had 2 episodes of blurred vision while driving. Episode lasted approx 15 minutes per pt. Pt had his granddaughter help guide him while driving. Mild headache. No hemiparesis, confusion. Noted baseline hx/o CAD. Not on ASA/plavix 2/2 to bleeding issues per pt.   Clinical Impression  Pt was seen for initial evaluation, wife present.  He reports feeling back to normal with no deficits detected.  Evaluation finds no significant change in functional level.  He does have a mild balance impairment, probably due to age, which is why he uses a quad cane.  Otherwise, functional mobility is WNL with no sensory loss.      Follow Up Recommendations No PT follow up    Equipment Recommendations  None recommended by PT    Recommendations for Other Services       Precautions / Restrictions Precautions Precautions: Fall Restrictions Weight Bearing Restrictions: No      Mobility  Bed Mobility Overal bed mobility: Independent                Transfers Overall transfer level: Independent                  Ambulation/Gait Ambulation/Gait assistance: Modified independent (Device/Increase time) Ambulation Distance (Feet): 250 Feet Assistive device: Quad cane Gait Pattern/deviations: WFL(Within Functional Limits)   Gait velocity interpretation: Below normal speed for age/gender General Gait Details: gait is slow but stable with quad cane and at prior level of proficiency per pt and wife  Stairs            Wheelchair Mobility    Modified Rankin (Stroke Patients Only)       Balance Overall balance assessment: Needs assistance Sitting-balance  support: No upper extremity supported;Feet supported Sitting balance-Leahy Scale: Good     Standing balance support: No upper extremity supported Standing balance-Leahy Scale: Fair Standing balance comment: mild balance deficit but not new...this is the reason for needing the quad cane                             Pertinent Vitals/Pain Pain Assessment: No/denies pain    Home Living Family/patient expects to be discharged to:: Private residence Living Arrangements: Spouse/significant other Available Help at Discharge: Family;Available 24 hours/day Type of Home: House Home Access: Stairs to enter   [employer] of Steps: 2 Home Layout: Laundry or work area in [place]: Kasandra Knudsen - quad      Prior Function Level of Independence: Independent with assistive device(s)               Hand Dominance        Extremity/Trunk Assessment   Upper Extremity Assessment: Defer to OT evaluation           Lower Extremity Assessment: Overall WFL for tasks assessed         Communication   Communication: No difficulties  Cognition Arousal/Alertness: Awake/alert Behavior During Therapy: WFL for tasks assessed/performed Overall Cognitive Status: Within Functional Limits for tasks assessed                      General Comments  Exercises        Assessment/Plan    PT Assessment Patent does not need any further PT services  PT Diagnosis     PT Problem List    PT Treatment Interventions     PT Goals (Current goals can be found in the Care Plan section) Acute Rehab PT Goals PT Goal Formulation: All assessment and education complete, DC therapy    Frequency     Barriers to discharge  none      Co-evaluation               End of Session Equipment Utilized During Treatment: Gait belt Activity Tolerance: Patient tolerated treatment well Patient left: in bed;with call bell/phone within reach Nurse Communication:  Mobility status    Functional Assessment Tool Used: clinical judgement Functional Limitation: Mobility: Walking and moving around Mobility: Walking and Moving Around Current Status ([id number]): At least 1 percent but less than 20 percent impaired, limited or restricted Mobility: Walking and Moving Around Goal Status [phone]): At least 1 percent but less than 20 percent impaired, limited or restricted Mobility: Walking and Moving Around Discharge Status [phone]): At least 1 percent but less than 20 percent impaired, limited or restricted    Time: 1315-1346 PT Time Calculation (min) (ACUTE ONLY): 31 min   Charges:   PT Evaluation $Initial PT Evaluation Tier I: 1 Procedure     PT G Codes:   PT G-Codes **NOT FOR INPATIENT CLASS** Functional Assessment Tool Used: clinical judgement Functional Limitation: Mobility: Walking and moving around Mobility: Walking and Moving Around Current Status (F6812): At least 1 percent but less than 20 percent impaired, limited or restricted Mobility: Walking and Moving Around Goal Status [phone]): At least 1 percent but less than 20 percent impaired, limited or restricted Mobility: Walking and Moving Around Discharge Status [phone]): At least 1 percent but less than 20 percent impaired, limited or restricted    Sable Feil 12/05/2014, 2:58 PM

## 2014-12-05 NOTE — Progress Notes (Signed)
OT Cancellation Note  Patient Details Name: Brian Mcmillan MRN: [id number] DOB: 1925-05-24   Cancelled Treatment:    Reason Eval/Treat Not Completed: Patient at procedure or test/ unavailable. Patient receiving MRI. Will re-attempt eval when able.   Ailene Ravel, OTR/L,CBIS  [phone]  12/05/2014, 9:13 AM

## 2014-12-05 NOTE — Discharge Summary (Signed)
Physician Discharge Summary  Brian Mcmillan:[id number] DOB: [birth year] DOA: 12/04/2014  PCP: Leonides Grills, MD  Admit date: 12/04/2014 Discharge date: 12/05/2014  Time spent: 45 minutes  Recommendations for Outpatient Follow-up:  -Will be discharged home today. -Advised to follow up with PCP in 2 weeks. -Have discontinued metoprolol as seems to be a duplication of therapy with carvedilol. At follow up will need to decide whether carvedilol requires dosing adjustments. -Found to have bilateral ICA stenosis on carotid dopplers and will require follow up dopplers in 6 months.    Discharge Diagnoses:  Principal Problem:   TIA (transient ischemic attack) Active Problems:   Hypothyroidism   Essential hypertension   Coronary atherosclerosis   Carotid disease, bilateral   Discharge Condition: Stable and improved  Filed Weights   12/04/14 1904 12/05/14 0058  Weight: 86.183 kg (190 lb) 86.183 kg (190 lb)    History of present illness:  This is a 81 y.o. year old male with significant past medical history of CAD s/p MI, HTN, hypothyroidism, CKD, MGUS presenting with ?TIA. Pt states that he had 2 episodes of blurred vision while driving. Episode lasted approx 15 minutes per pt. Pt had his granddaughter help guide him while driving. Mild headache. No hemiparesis, confusion. Noted baseline hx/o CAD. Not on ASA/plavix 2/2 to bleeding issues per pt.  Presented to the ER afebrile.BP 150s-210s/80s-110s. Cr 1.8. Glu 135. Head CT w/ no acute abnormalities-noted chronic microvascular disease. Vision improved per pt.   Hospital Course:   Blurred Vision -Etiology unclear, but likely either related to hypotension (BP was 80/55 at home prior to presentation) or a manifestation of a TIA. -ECHO: Left ventricle: The cavity size was normal. There was moderate concentric hypertrophy. Systolic function was normal. The estimated ejection fraction was in the range of 55% to 60%. Doppler  parameters are consistent with abnormal left ventricular relaxation (grade 1 diastolic dysfunction). Doppler parameters are consistent with high ventricular filling pressure. -Carotid Dopplers: IMPRESSION: 1. Bilateral carotid bifurcation and proximal ICA plaque, resulting in 50-69% diameter stenosis on the right, less than 50% diameter stenosis on the left. The exam does not exclude plaque ulceration or embolization. Continued surveillance recommended. -PT recommends no follow up. -Cannot take ASA due to recurrent nosebleeds.  Carotid Disease, Bilateral -As per dopplers above. -Will need 6 month surveillance.  HTN -Was hypotensive upon presentation. -Metoprolol has been discontinued as seems a duplication of therapy with coreg. -Continue OP titration of BP medications.  Rest of chronic medical conditions have been stable.  Procedures:  As above  Consultations:  None  Discharge Instructions      Discharge Instructions    Diet - low sodium heart healthy    Complete by:  As directed      Increase activity slowly    Complete by:  As directed             Medication List    STOP taking these medications        losartan 100 MG tablet  Commonly known as:  COZAAR     metoprolol succinate 25 MG 24 hr tablet  Commonly known as:  TOPROL-XL      TAKE these medications        albuterol-ipratropium 18-103 MCG/ACT inhaler  Commonly known as:  COMBIVENT  Inhale 2-4 puffs into the lungs 2 (two) times daily as needed for wheezing.     ALPRAZolam 0.5 MG tablet  Commonly known as:  XANAX  Take 0.5 mg by mouth  at bedtime as needed for anxiety.     carvedilol 6.25 MG tablet  Commonly known as:  COREG  Take 6.25 mg by mouth 2 (two) times daily with a meal.     FLOMAX 0.4 MG Caps capsule  Generic drug:  tamsulosin  Take 0.4 mg by mouth at bedtime.     furosemide 40 MG tablet  Commonly known as:  LASIX  Take 20 mg by mouth 2 (two) times daily.     levothyroxine  125 MCG tablet  Commonly known as:  SYNTHROID, LEVOTHROID  Take 125 mcg by mouth daily.     montelukast 10 MG tablet  Commonly known as:  SINGULAIR  Take 10 mg by mouth at bedtime.     multivitamin-iron-minerals-folic acid Tabs tablet  Take 1 tablet by mouth daily.     nitroGLYCERIN 0.6 mg/hr patch  Commonly known as:  NITRODUR - Dosed in mg/24 hr  Place 0.6 mg onto the skin daily.     nitroGLYCERIN 0.4 MG SL tablet  Commonly known as:  NITROSTAT  Place 1 tablet (0.4 mg total) under the tongue as needed.     pravastatin 20 MG tablet  Commonly known as:  PRAVACHOL  Take 20 mg by mouth at bedtime.     tiotropium 18 MCG inhalation capsule  Commonly known as:  SPIRIVA  Place 18 mcg into inhaler and inhale daily.     vitamin B-12 1000 MCG tablet  Commonly known as:  CYANOCOBALAMIN  Take 1,000 mcg by mouth daily.     Vitamin D3 5000 UNITS Caps  Take 5,000 Units by mouth daily.     zolpidem 10 MG tablet  Commonly known as:  AMBIEN  Take 10 mg by mouth at bedtime as needed for sleep.       Allergies  Allergen Reactions  . Ranolazine Shortness Of Breath  . Simvastatin Other (See Comments)    fatigue  . Aspirin Other (See Comments)    Causes severe bleeding  . Contrast Media [Iodinated Diagnostic Agents] Other (See Comments)    Will shut kidneys down   Follow-up Information    Follow up with Leonides Grills, MD. Schedule an appointment as soon as possible for a visit in 2 weeks.   Specialty:  Family Medicine   Contact information:   [address] [place] [address] [phone]        The results of significant diagnostics from this hospitalization (including imaging, microbiology, ancillary and laboratory) are listed below for reference.    Significant Diagnostic Studies: Dg Chest 2 View  12/04/2014   CLINICAL DATA:  TIA. Sudden development of blurry vision. Generalized weakness tonight.  EXAM: CHEST  2 VIEW  COMPARISON:  09/06/2014  FINDINGS:  Normal heart size and pulmonary vascularity. Emphysematous changes and scattered fibrosis in the lungs. No blunting of costophrenic angles. No pneumothorax. Calcification in the aorta.  IMPRESSION: Emphysematous changes and fibrosis in the lungs. No evidence of active pulmonary disease.   Electronically Signed   By: Lucienne Capers M.D.   On: 12/04/2014 23:42   Ct Head Wo Contrast  12/04/2014   CLINICAL DATA:  80 year old male with dizziness and blurred vision  EXAM: CT HEAD WITHOUT CONTRAST  TECHNIQUE: Contiguous axial images were obtained from the base of the skull through the vertex without intravenous contrast.  COMPARISON:  07/04/2010  FINDINGS: Unremarkable appearance of the calvarium without acute fracture or aggressive lesion.  Unremarkable appearance of the scalp soft tissues.  Unremarkable appearance of the bilateral  orbits.  Right mastoid air cells are clear. Trace fluid within the inferior left mastoid air cells.  No significant paranasal sinus disease  No CT evidence of acute intracranial hemorrhage, midline shift, or mass effect.  Gray-white differentiation is maintained.  Unchanged configuration of the ventricles.  Mild senescent brain volume loss, similar to prior. Periventricular hypodensities, particularly of the right parietal region, similar to comparison. Confluent hypodensities overlying the frontal horns.  Calcifications of the anterior circulation.  IMPRESSION: No CT evidence of acute intracranial abnormality.  Re- demonstration of mild brain volume loss and evidence of chronic microvascular ischemic disease with associated intracranial atherosclerosis.  Trace fluid within the inferior left mastoid air cells again present.  Signed,  Dulcy Fanny. Earleen Newport, DO  [facility]  [facility]   Electronically Signed   By: [name] D.O.   On: 12/04/2014 21:12   Mr Jodene Nam Head Wo Contrast  12/05/2014   CLINICAL DATA:  TIA.  Episode of visual disturbance  yesterday.  EXAM: MRI HEAD WITHOUT CONTRAST  MRA HEAD WITHOUT CONTRAST  TECHNIQUE: Multiplanar, multiecho pulse sequences of the brain and surrounding structures were obtained without intravenous contrast. Angiographic images of the head were obtained using MRA technique without contrast.  COMPARISON:  Head CT 12/04/2014  FINDINGS: MRI HEAD FINDINGS  Images are mildly degraded by motion artifact. There is no evidence of acute infarct, intracranial hemorrhage, mass, midline shift, or extra-axial fluid collection. There is mild generalized cerebral atrophy. Patchy T2 hyperintensities in the cerebral white matter are nonspecific but compatible with mild chronic small vessel ischemic disease.  Prior bilateral cataract extraction is noted. The minimal bilateral ethmoid air cell mucosal thickening and small bilateral mastoid effusions are present. Major intracranial vascular flow voids are preserved.  MRA HEAD FINDINGS  Images are mildly degraded by motion artifact. The visualized distal vertebral arteries are patent without stenosis. Right vertebral artery is minimally larger than the left. PICA, AICA, and SCA origins are patent. Basilar artery is patent without stenosis. PCAs are patent with mild right P2 and moderate to severe left P2 segment stenoses.  Internal carotid arteries are patent from skullbase to carotid termini without evidence of significant stenosis. Region of signal dropout involving the anterior genu of the right cavernous carotid may be artifactual or reflect mild narrowing. Anterior communicating artery is not clearly identified. Right A1 segment is mildly dominant. ACAs and MCAs are patent without evidence of proximal stenosis. Mild branch vessel irregularity is noted bilaterally. No intracranial aneurysm is identified.  IMPRESSION: 1. No acute intracranial abnormality. 2. Mild chronic small vessel ischemic disease and cerebral atrophy. 3. No evidence of major intracranial arterial occlusion or  high-grade proximal stenosis. Mild right and moderate to severe left PCA P2 segment stenoses.   Electronically Signed   By: Logan Bores   On: 12/05/2014 09:51   Mr Brain Wo Contrast  12/05/2014   CLINICAL DATA:  TIA.  Episode of visual disturbance yesterday.  EXAM: MRI HEAD WITHOUT CONTRAST  MRA HEAD WITHOUT CONTRAST  TECHNIQUE: Multiplanar, multiecho pulse sequences of the brain and surrounding structures were obtained without intravenous contrast. Angiographic images of the head were obtained using MRA technique without contrast.  COMPARISON:  Head CT 12/04/2014  FINDINGS: MRI HEAD FINDINGS  Images are mildly degraded by motion artifact. There is no evidence of acute infarct, intracranial hemorrhage, mass, midline shift, or extra-axial fluid collection. There is mild generalized cerebral atrophy. Patchy T2 hyperintensities in the cerebral white matter are nonspecific but compatible  with mild chronic small vessel ischemic disease.  Prior bilateral cataract extraction is noted. The minimal bilateral ethmoid air cell mucosal thickening and small bilateral mastoid effusions are present. Major intracranial vascular flow voids are preserved.  MRA HEAD FINDINGS  Images are mildly degraded by motion artifact. The visualized distal vertebral arteries are patent without stenosis. Right vertebral artery is minimally larger than the left. PICA, AICA, and SCA origins are patent. Basilar artery is patent without stenosis. PCAs are patent with mild right P2 and moderate to severe left P2 segment stenoses.  Internal carotid arteries are patent from skullbase to carotid termini without evidence of significant stenosis. Region of signal dropout involving the anterior genu of the right cavernous carotid may be artifactual or reflect mild narrowing. Anterior communicating artery is not clearly identified. Right A1 segment is mildly dominant. ACAs and MCAs are patent without evidence of proximal stenosis. Mild branch vessel  irregularity is noted bilaterally. No intracranial aneurysm is identified.  IMPRESSION: 1. No acute intracranial abnormality. 2. Mild chronic small vessel ischemic disease and cerebral atrophy. 3. No evidence of major intracranial arterial occlusion or high-grade proximal stenosis. Mild right and moderate to severe left PCA P2 segment stenoses.   Electronically Signed   By: Logan Bores   On: 12/05/2014 09:51   US Carotid Bilateral  12/05/2014   CLINICAL DATA:  TIA, visual disturbance with syncope 1 day ago. Hypertension, Coronary artery disease post myocardial infarction, previous tobacco abuse.  EXAM: BILATERAL CAROTID DUPLEX ULTRASOUND  TECHNIQUE: Pearline Cables scale imaging, color Doppler and duplex ultrasound was performed of bilateral carotid and vertebral arteries in the neck.  COMPARISON:  None.  REVIEW OF SYSTEMS: Quantification of carotid stenosis is based on velocity parameters that correlate the residual internal carotid diameter with NASCET-based stenosis levels, using the diameter of the distal internal carotid lumen as the denominator for stenosis measurement.  The following velocity measurements were obtained:  PEAK SYSTOLIC/END DIASTOLIC  RIGHT  ICA:                     193/30cm/sec  CCA:                     25/85ID/POE  SYSTOLIC ICA/CCA RATIO:  2.9  DIASTOLIC ICA/CCA RATIO:  3.0  ECA:  172cm/sec  LEFT  ICA:                     91/7cm/sec  CCA:                     42/35TI/RWE  SYSTOLIC ICA/CCA RATIO:  1.2  DIASTOLIC ICA/CCA RATIO: 0.7  ECA:                     139cm/sec  FINDINGS: RIGHT CAROTID ARTERY: Intimal thickening with scattered partially calcified plaque in the common carotid artery most marked in its mid and distal segments. Circumferential partially calcified plaque effaces the carotid bulb and extends into the proximal ICA with at least mild proximal stenosis. Mildly elevated peak systolic velocities in the proximal ICA. Normal waveforms elsewhere.  RIGHT VERTEBRAL ARTERY:  Normal flow  direction and waveform.  LEFT CAROTID ARTERY: Intimal thickening with some eccentric calcified nonocclusive plaque in the common carotid artery. Circumferential eccentric partially calcified plaque in the carotid bulb extending into the proximal ICA without high-grade stenosis. Normal waveforms and color Doppler signal.  LEFT VERTEBRAL ARTERY: Normal flow direction and waveform.  IMPRESSION: 1. Bilateral carotid  bifurcation and proximal ICA plaque, resulting in 50-69% diameter stenosis on the right, less than 50% diameter stenosis on the left. The exam does not exclude plaque ulceration or embolization. Continued surveillance recommended.   Electronically Signed   By: Lucrezia Europe M.D.   On: 12/05/2014 09:06    Microbiology: No results found for this or any previous visit (from the past 240 hour(s)).   Labs: Basic Metabolic Panel:  Recent Labs Lab 12/04/14 2000 12/04/14 2013  NA 137 142  K 3.6 3.7  CL 106 101  CO2 29  --   GLUCOSE 134* 135*  BUN 30* 29*  CREATININE 1.77* 1.80*  CALCIUM 9.2  --    Liver Function Tests:  Recent Labs Lab 12/04/14 2000  AST 26  ALT 16  ALKPHOS 44  BILITOT 1.1  PROT 7.6  ALBUMIN 3.9   No results for input(s): LIPASE, AMYLASE in the last 168 hours. No results for input(s): AMMONIA in the last 168 hours. CBC:  Recent Labs Lab 12/04/14 2000 12/04/14 2013  WBC 4.8  --   NEUTROABS 1.9  --   HGB 12.4* 12.9*  HCT 36.7* 38.0*  MCV 88.2  --   PLT 129*  --    Cardiac Enzymes: No results for input(s): CKTOTAL, CKMB, CKMBINDEX, TROPONINI in the last 168 hours. BNP: BNP (last 3 results) No results for input(s): BNP in the last 8760 hours.  ProBNP (last 3 results) No results for input(s): PROBNP in the last 8760 hours.  CBG:  Recent Labs Lab 12/04/14 1913  GLUCAP 137*       Signed:  Lelon Frohlich  [facility] Pager: [phone] 12/05/2014, 5:18 PM

## 2014-12-06 LAB — HEMOGLOBIN A1C
Hgb A1c MFr Bld: 6.1 % — ABNORMAL HIGH (ref 4.8–5.6)
MEAN PLASMA GLUCOSE: 128 mg/dL

## 2014-12-07 NOTE — Progress Notes (Signed)
Discharge instructions were given to patient,and family both verbalized understanding. No c/o pain or discomfort noted. Staff accompanied patient to awaiting vehicle.

## 2014-12-09 DIAGNOSIS — J439 Emphysema, unspecified: Secondary | ICD-10-CM | POA: Diagnosis not present

## 2014-12-09 DIAGNOSIS — I255 Ischemic cardiomyopathy: Secondary | ICD-10-CM | POA: Diagnosis not present

## 2014-12-09 DIAGNOSIS — I25118 Atherosclerotic heart disease of native coronary artery with other forms of angina pectoris: Secondary | ICD-10-CM | POA: Diagnosis not present

## 2014-12-09 DIAGNOSIS — I5022 Chronic systolic (congestive) heart failure: Secondary | ICD-10-CM | POA: Diagnosis not present

## 2014-12-24 DIAGNOSIS — R5383 Other fatigue: Secondary | ICD-10-CM | POA: Diagnosis not present

## 2014-12-24 DIAGNOSIS — E860 Dehydration: Secondary | ICD-10-CM | POA: Diagnosis not present

## 2014-12-24 DIAGNOSIS — Z6828 Body mass index (BMI) 28.0-28.9, adult: Secondary | ICD-10-CM | POA: Diagnosis not present

## 2014-12-24 DIAGNOSIS — E119 Type 2 diabetes mellitus without complications: Secondary | ICD-10-CM | POA: Diagnosis not present

## 2014-12-26 DIAGNOSIS — Z85828 Personal history of other malignant neoplasm of skin: Secondary | ICD-10-CM | POA: Diagnosis not present

## 2014-12-26 DIAGNOSIS — Z08 Encounter for follow-up examination after completed treatment for malignant neoplasm: Secondary | ICD-10-CM | POA: Diagnosis not present

## 2014-12-30 DIAGNOSIS — Q6100 Congenital renal cyst, unspecified: Secondary | ICD-10-CM | POA: Diagnosis not present

## 2014-12-30 DIAGNOSIS — N401 Enlarged prostate with lower urinary tract symptoms: Secondary | ICD-10-CM | POA: Diagnosis not present

## 2014-12-30 DIAGNOSIS — C642 Malignant neoplasm of left kidney, except renal pelvis: Secondary | ICD-10-CM | POA: Diagnosis not present

## 2015-02-11 DIAGNOSIS — B351 Tinea unguium: Secondary | ICD-10-CM | POA: Diagnosis not present

## 2015-02-11 DIAGNOSIS — M79676 Pain in unspecified toe(s): Secondary | ICD-10-CM | POA: Diagnosis not present

## 2015-02-24 DIAGNOSIS — I10 Essential (primary) hypertension: Secondary | ICD-10-CM | POA: Diagnosis not present

## 2015-02-24 DIAGNOSIS — I5022 Chronic systolic (congestive) heart failure: Secondary | ICD-10-CM | POA: Diagnosis not present

## 2015-02-24 DIAGNOSIS — I25119 Atherosclerotic heart disease of native coronary artery with unspecified angina pectoris: Secondary | ICD-10-CM | POA: Diagnosis not present

## 2015-02-24 DIAGNOSIS — I255 Ischemic cardiomyopathy: Secondary | ICD-10-CM | POA: Diagnosis not present

## 2015-04-01 DIAGNOSIS — D3A00 Benign carcinoid tumor of unspecified site: Secondary | ICD-10-CM | POA: Diagnosis not present

## 2015-04-01 DIAGNOSIS — R591 Generalized enlarged lymph nodes: Secondary | ICD-10-CM | POA: Diagnosis not present

## 2015-04-01 DIAGNOSIS — R599 Enlarged lymph nodes, unspecified: Secondary | ICD-10-CM | POA: Diagnosis not present

## 2015-04-01 DIAGNOSIS — D472 Monoclonal gammopathy: Secondary | ICD-10-CM | POA: Diagnosis not present

## 2015-04-03 DIAGNOSIS — D3A00 Benign carcinoid tumor of unspecified site: Secondary | ICD-10-CM | POA: Diagnosis not present

## 2015-04-22 DIAGNOSIS — B351 Tinea unguium: Secondary | ICD-10-CM | POA: Diagnosis not present

## 2015-04-22 DIAGNOSIS — M79676 Pain in unspecified toe(s): Secondary | ICD-10-CM | POA: Diagnosis not present

## 2015-04-25 DIAGNOSIS — J449 Chronic obstructive pulmonary disease, unspecified: Secondary | ICD-10-CM | POA: Diagnosis not present

## 2015-04-25 DIAGNOSIS — J0100 Acute maxillary sinusitis, unspecified: Secondary | ICD-10-CM | POA: Diagnosis not present

## 2015-04-25 DIAGNOSIS — J209 Acute bronchitis, unspecified: Secondary | ICD-10-CM | POA: Diagnosis not present

## 2015-04-25 DIAGNOSIS — E663 Overweight: Secondary | ICD-10-CM | POA: Diagnosis not present

## 2015-04-25 DIAGNOSIS — I10 Essential (primary) hypertension: Secondary | ICD-10-CM | POA: Diagnosis not present

## 2015-04-25 DIAGNOSIS — Z6828 Body mass index (BMI) 28.0-28.9, adult: Secondary | ICD-10-CM | POA: Diagnosis not present

## 2015-04-25 DIAGNOSIS — E782 Mixed hyperlipidemia: Secondary | ICD-10-CM | POA: Diagnosis not present

## 2015-04-25 DIAGNOSIS — Z1389 Encounter for screening for other disorder: Secondary | ICD-10-CM | POA: Diagnosis not present

## 2015-06-20 ENCOUNTER — Other Ambulatory Visit (HOSPITAL_COMMUNITY): Payer: Self-pay | Admitting: Internal Medicine

## 2015-06-20 ENCOUNTER — Ambulatory Visit (HOSPITAL_COMMUNITY)
Admission: RE | Admit: 2015-06-20 | Discharge: 2015-06-20 | Disposition: A | Discharge status: Home / Self Care | Payer: Medicare Other | Source: Ambulatory Visit | Attending: Internal Medicine | Admitting: Internal Medicine

## 2015-06-20 DIAGNOSIS — Z1389 Encounter for screening for other disorder: Secondary | ICD-10-CM

## 2015-06-20 DIAGNOSIS — R079 Chest pain, unspecified: Secondary | ICD-10-CM | POA: Insufficient documentation

## 2015-06-20 DIAGNOSIS — I252 Old myocardial infarction: Secondary | ICD-10-CM | POA: Diagnosis not present

## 2015-06-20 DIAGNOSIS — R0602 Shortness of breath: Secondary | ICD-10-CM | POA: Insufficient documentation

## 2015-06-20 DIAGNOSIS — R05 Cough: Secondary | ICD-10-CM | POA: Diagnosis not present

## 2015-06-20 DIAGNOSIS — E063 Autoimmune thyroiditis: Secondary | ICD-10-CM | POA: Diagnosis not present

## 2015-06-20 DIAGNOSIS — J449 Chronic obstructive pulmonary disease, unspecified: Secondary | ICD-10-CM | POA: Diagnosis not present

## 2015-06-20 DIAGNOSIS — Z87891 Personal history of nicotine dependence: Secondary | ICD-10-CM | POA: Insufficient documentation

## 2015-06-20 DIAGNOSIS — I509 Heart failure, unspecified: Secondary | ICD-10-CM | POA: Diagnosis not present

## 2015-06-20 DIAGNOSIS — E1129 Type 2 diabetes mellitus with other diabetic kidney complication: Secondary | ICD-10-CM | POA: Diagnosis not present

## 2015-06-30 DIAGNOSIS — L570 Actinic keratosis: Secondary | ICD-10-CM | POA: Diagnosis not present

## 2015-06-30 DIAGNOSIS — Z08 Encounter for follow-up examination after completed treatment for malignant neoplasm: Secondary | ICD-10-CM | POA: Diagnosis not present

## 2015-06-30 DIAGNOSIS — Z85828 Personal history of other malignant neoplasm of skin: Secondary | ICD-10-CM | POA: Diagnosis not present

## 2015-06-30 DIAGNOSIS — X32XXXD Exposure to sunlight, subsequent encounter: Secondary | ICD-10-CM | POA: Diagnosis not present

## 2015-07-01 DIAGNOSIS — B351 Tinea unguium: Secondary | ICD-10-CM | POA: Diagnosis not present

## 2015-07-01 DIAGNOSIS — M79676 Pain in unspecified toe(s): Secondary | ICD-10-CM | POA: Diagnosis not present

## 2015-07-07 DIAGNOSIS — C642 Malignant neoplasm of left kidney, except renal pelvis: Secondary | ICD-10-CM | POA: Diagnosis not present

## 2015-07-07 DIAGNOSIS — N183 Chronic kidney disease, stage 3 (moderate): Secondary | ICD-10-CM | POA: Diagnosis not present

## 2015-07-07 DIAGNOSIS — N403 Nodular prostate with lower urinary tract symptoms: Secondary | ICD-10-CM | POA: Diagnosis not present

## 2015-07-07 DIAGNOSIS — N281 Cyst of kidney, acquired: Secondary | ICD-10-CM | POA: Diagnosis not present

## 2015-07-29 DIAGNOSIS — I10 Essential (primary) hypertension: Secondary | ICD-10-CM | POA: Diagnosis not present

## 2015-07-29 DIAGNOSIS — E119 Type 2 diabetes mellitus without complications: Secondary | ICD-10-CM | POA: Diagnosis not present

## 2015-07-29 DIAGNOSIS — I2510 Atherosclerotic heart disease of native coronary artery without angina pectoris: Secondary | ICD-10-CM | POA: Diagnosis not present

## 2015-07-29 DIAGNOSIS — Z0000 Encounter for general adult medical examination without abnormal findings: Secondary | ICD-10-CM | POA: Diagnosis not present

## 2015-07-29 DIAGNOSIS — Z23 Encounter for immunization: Secondary | ICD-10-CM | POA: Diagnosis not present

## 2015-07-29 DIAGNOSIS — Z6828 Body mass index (BMI) 28.0-28.9, adult: Secondary | ICD-10-CM | POA: Diagnosis not present

## 2015-07-29 DIAGNOSIS — E782 Mixed hyperlipidemia: Secondary | ICD-10-CM | POA: Diagnosis not present

## 2015-07-29 DIAGNOSIS — E663 Overweight: Secondary | ICD-10-CM | POA: Diagnosis not present

## 2015-08-27 ENCOUNTER — Encounter: Payer: Self-pay | Admitting: Cardiology

## 2015-09-03 DIAGNOSIS — Z125 Encounter for screening for malignant neoplasm of prostate: Secondary | ICD-10-CM | POA: Diagnosis not present

## 2015-09-03 DIAGNOSIS — Z6828 Body mass index (BMI) 28.0-28.9, adult: Secondary | ICD-10-CM | POA: Diagnosis not present

## 2015-09-03 DIAGNOSIS — I10 Essential (primary) hypertension: Secondary | ICD-10-CM | POA: Diagnosis not present

## 2015-09-03 DIAGNOSIS — R972 Elevated prostate specific antigen [PSA]: Secondary | ICD-10-CM | POA: Diagnosis not present

## 2015-09-03 DIAGNOSIS — E782 Mixed hyperlipidemia: Secondary | ICD-10-CM | POA: Diagnosis not present

## 2015-09-03 DIAGNOSIS — E039 Hypothyroidism, unspecified: Secondary | ICD-10-CM | POA: Diagnosis not present

## 2015-09-03 DIAGNOSIS — E119 Type 2 diabetes mellitus without complications: Secondary | ICD-10-CM | POA: Diagnosis not present

## 2015-09-03 DIAGNOSIS — Z1389 Encounter for screening for other disorder: Secondary | ICD-10-CM | POA: Diagnosis not present

## 2015-09-03 DIAGNOSIS — E663 Overweight: Secondary | ICD-10-CM | POA: Diagnosis not present

## 2015-09-09 DIAGNOSIS — B351 Tinea unguium: Secondary | ICD-10-CM | POA: Diagnosis not present

## 2015-09-09 DIAGNOSIS — M79676 Pain in unspecified toe(s): Secondary | ICD-10-CM | POA: Diagnosis not present

## 2015-09-22 DIAGNOSIS — R591 Generalized enlarged lymph nodes: Secondary | ICD-10-CM | POA: Diagnosis not present

## 2015-09-22 DIAGNOSIS — R040 Epistaxis: Secondary | ICD-10-CM | POA: Diagnosis not present

## 2015-09-22 DIAGNOSIS — D472 Monoclonal gammopathy: Secondary | ICD-10-CM | POA: Diagnosis not present

## 2015-11-06 ENCOUNTER — Encounter (INDEPENDENT_AMBULATORY_CARE_PROVIDER_SITE_OTHER): Payer: Self-pay | Admitting: *Deleted

## 2015-11-06 ENCOUNTER — Encounter (INDEPENDENT_AMBULATORY_CARE_PROVIDER_SITE_OTHER): Payer: Self-pay | Admitting: Internal Medicine

## 2015-11-06 ENCOUNTER — Ambulatory Visit (INDEPENDENT_AMBULATORY_CARE_PROVIDER_SITE_OTHER): Payer: Medicare Other | Admitting: Internal Medicine

## 2015-11-06 VITALS — BP 140/80 | HR 60 | Temp 97.2°F | Resp 18 | Ht 74.0 in | Wt 202.7 lb

## 2015-11-06 DIAGNOSIS — R1319 Other dysphagia: Secondary | ICD-10-CM | POA: Insufficient documentation

## 2015-11-06 DIAGNOSIS — R1314 Dysphagia, pharyngoesophageal phase: Secondary | ICD-10-CM

## 2015-11-06 DIAGNOSIS — K219 Gastro-esophageal reflux disease without esophagitis: Secondary | ICD-10-CM

## 2015-11-06 DIAGNOSIS — R1313 Dysphagia, pharyngeal phase: Secondary | ICD-10-CM

## 2015-11-06 DIAGNOSIS — R1310 Dysphagia, unspecified: Secondary | ICD-10-CM | POA: Insufficient documentation

## 2015-11-06 MED ORDER — PANTOPRAZOLE SODIUM 40 MG PO TBEC: 40.0000 mg | DELAYED_RELEASE_TABLET | Freq: Two times a day (BID) | ORAL | Status: DC

## 2015-11-06 NOTE — Progress Notes (Signed)
Presenting complaint;  Swallowing difficulty and frequent heartburn.  Subjective:  Patient is 82 year old Caucasian male who presents with history of dysphagia and frequent heartburn. He states he's had swallowing difficulty for at least 10 years but it was sporadic until one year ago. He is having difficulty swallowing pills and solids couple of times a week if not daily. He has difficulty swallowing pills every morning and points to his throat as site of pill obstruction. He is eventually able to swallow the pills. He points to mid sternum area at site of bolus obstruction. He has difficulty with more solids but primarily meats. He states food bolus passes distally within 5-10 minutes. He has never had an episode where he had to regurgitate food or pills for relief. He is having daily heartburn. He also has intermittent retrosternal pain at night. He's not sure of this is from his esophagus or hot. He has known coronary artery disease. This pain is not associated with palpitation lightheadedness or diaphoresis. He tells me he has multiple blockages and he developed severe nosebleed with antiplatelet agents and these were stopped. He has good appetite. He denies recent weight loss. His bowels move twice daily. He denies melena or rectal bleeding. He is married and lives in [place]. He is retired. He smoked less than pack of cigarettes per day for 40 years and quit 35 years ago. He does not drink alcohol.   Current Medications: Outpatient Encounter Prescriptions as of 11/06/2015  Medication Sig  . albuterol-ipratropium (COMBIVENT) 18-103 MCG/ACT inhaler Inhale 2-4 puffs into the lungs 2 (two) times daily as needed for wheezing.   Marland Kitchen ALPRAZolam (XANAX) 0.5 MG tablet Take 0.5 mg by mouth at bedtime as needed for anxiety.  . carvedilol (COREG) 6.25 MG tablet Take 6.25 mg by mouth 2 (two) times daily with a meal.    . Cholecalciferol (VITAMIN D3) 5000 UNITS CAPS Take 5,000 Units by mouth daily.   .  furosemide (LASIX) 40 MG tablet Take 20 mg by mouth 2 (two) times daily.   Marland Kitchen levothyroxine (SYNTHROID, LEVOTHROID) 125 MCG tablet Take 125 mcg by mouth daily.   . montelukast (SINGULAIR) 10 MG tablet Take 10 mg by mouth at bedtime.   . multivitamin-iron-minerals-folic acid (THERAPEUTIC-M) TABS tablet Take 1 tablet by mouth daily.  . nitroGLYCERIN (NITRODUR - DOSED IN MG/24 HR) 0.6 mg/hr patch Place 0.6 mg onto the skin daily.  . nitroGLYCERIN (NITROSTAT) 0.4 MG SL tablet Place 1 tablet (0.4 mg total) under the tongue as needed. (Patient taking differently: Place 0.4 mg under the tongue as needed for chest pain. )  . Omega-3 Fatty Acids (FISH OIL) 1000 MG CPDR Take 1,000 mg by mouth daily.  . pravastatin (PRAVACHOL) 20 MG tablet Take 20 mg by mouth at bedtime.  Marland Kitchen tiotropium (SPIRIVA) 18 MCG inhalation capsule Place 18 mcg into inhaler and inhale daily.  . vitamin B-12 (CYANOCOBALAMIN) 1000 MCG tablet Take 1,000 mcg by mouth daily.    Marland Kitchen zolpidem (AMBIEN) 10 MG tablet Take 10 mg by mouth at bedtime as needed for sleep.  . [DISCONTINUED] Tamsulosin HCl (FLOMAX) 0.4 MG CAPS Take 0.4 mg by mouth at bedtime. Reported on 11/06/2015   No facility-administered encounter medications on file as of 11/06/2015.   Past medical history:  Hypertension. Coronary artery disease. History of chronic systolic failure. Dyslipidemia. History of GERD but presently no therapy. Hypothyroidism. History of nosebleeds while on antiplatelet agents. History of colonic adenomas. Last colonoscopy was in May 2014 with removal of 5 tubular  adenomas. History of mono clonal gammopathy. Surgery for deviated nasal septum in 1966. Vagotomy in 1980. Lumbar spine surgery for disc disease have her years ago. Exploratory laparotomy in July 2008 for retroperitoneal adenopathy and mesenteric thickening. He had mesenteric fat necrosis but no malignancy. History of left renal cell carcinoma treated with RFA at [facility] in 2009.    Allergies: Allergies  Allergen Reactions  . Ranolazine Shortness Of Breath  . Simvastatin Other (See Comments)    fatigue  . Aspirin Other (See Comments)    Causes severe bleeding  . Contrast Media [Iodinated Diagnostic Agents] Other (See Comments)    Will shut kidneys down     Objective: Blood pressure 140/80, pulse 60, temperature 97.2 F (36.2 C), temperature source Oral, resp. rate 18, height 6\' 2"  (1.88 m), weight 202 lb 11.2 oz (91.944 kg). Patient is alert and in no acute distress. Conjunctiva is pink. Sclera is nonicteric Oropharyngeal mucosa is normal. No neck masses or thyromegaly noted. Cardiac exam with regular rhythm normal S1 and S2. No murmur or gallop noted. Lungs are clear to auscultation. Abdomen is symmetrical with lower midline scar and small umbilical hernia which is completely reducible. Abdomen is soft and nontender without organomegaly or masses.  No LE edema or clubbing noted.   Assessment:  #1. Dysphagia. Patient's symptoms are suggestive of both pharyngeal and esophageal dysphagia. He is having difficulty with pills as well as solids. Given history of heartburn he could have esophageal stricture. Given his age esophageal motility disorder needs to be ruled out. #2. GERD. He is having daily heartburn. He is also having intermittent chest pain which may be secondary to GERD although he does have history of coronary artery disease. His heartburn is not well controlled with dietary measures.   Plan:  Pantoprazole 40 mg by mouth 30 minutes before breakfast daily. Barium pill esophagogram.

## 2015-11-06 NOTE — Patient Instructions (Signed)
Take pantoprazole 40 mg by mouth 30 minutes before breakfast daily. Physician will call with results of barium pill esophagogram when completed

## 2015-11-10 ENCOUNTER — Ambulatory Visit (HOSPITAL_COMMUNITY)
Admission: RE | Admit: 2015-11-10 | Discharge: 2015-11-10 | Disposition: A | Discharge status: Home / Self Care | Payer: Medicare Other | Source: Ambulatory Visit | Attending: Internal Medicine | Admitting: Internal Medicine

## 2015-11-10 DIAGNOSIS — I2510 Atherosclerotic heart disease of native coronary artery without angina pectoris: Secondary | ICD-10-CM | POA: Insufficient documentation

## 2015-11-10 DIAGNOSIS — R1310 Dysphagia, unspecified: Secondary | ICD-10-CM

## 2015-11-10 DIAGNOSIS — R1313 Dysphagia, pharyngeal phase: Secondary | ICD-10-CM | POA: Diagnosis not present

## 2015-11-10 DIAGNOSIS — R1314 Dysphagia, pharyngoesophageal phase: Secondary | ICD-10-CM | POA: Insufficient documentation

## 2015-11-10 DIAGNOSIS — Z87891 Personal history of nicotine dependence: Secondary | ICD-10-CM | POA: Insufficient documentation

## 2015-11-10 DIAGNOSIS — I10 Essential (primary) hypertension: Secondary | ICD-10-CM | POA: Diagnosis not present

## 2015-11-10 DIAGNOSIS — R1319 Other dysphagia: Secondary | ICD-10-CM | POA: Diagnosis not present

## 2015-11-10 DIAGNOSIS — C649 Malignant neoplasm of unspecified kidney, except renal pelvis: Secondary | ICD-10-CM | POA: Insufficient documentation

## 2015-11-10 DIAGNOSIS — K224 Dyskinesia of esophagus: Secondary | ICD-10-CM | POA: Insufficient documentation

## 2015-11-18 DIAGNOSIS — M79676 Pain in unspecified toe(s): Secondary | ICD-10-CM | POA: Diagnosis not present

## 2015-11-18 DIAGNOSIS — B351 Tinea unguium: Secondary | ICD-10-CM | POA: Diagnosis not present

## 2015-11-19 DIAGNOSIS — Z6827 Body mass index (BMI) 27.0-27.9, adult: Secondary | ICD-10-CM | POA: Diagnosis not present

## 2015-11-19 DIAGNOSIS — J209 Acute bronchitis, unspecified: Secondary | ICD-10-CM | POA: Diagnosis not present

## 2015-11-19 DIAGNOSIS — E1129 Type 2 diabetes mellitus with other diabetic kidney complication: Secondary | ICD-10-CM | POA: Diagnosis not present

## 2015-11-19 DIAGNOSIS — E782 Mixed hyperlipidemia: Secondary | ICD-10-CM | POA: Diagnosis not present

## 2015-11-19 DIAGNOSIS — Z1389 Encounter for screening for other disorder: Secondary | ICD-10-CM | POA: Diagnosis not present

## 2015-11-19 DIAGNOSIS — J019 Acute sinusitis, unspecified: Secondary | ICD-10-CM | POA: Diagnosis not present

## 2015-11-21 ENCOUNTER — Encounter (INDEPENDENT_AMBULATORY_CARE_PROVIDER_SITE_OTHER): Payer: Self-pay | Admitting: *Deleted

## 2015-11-24 DIAGNOSIS — I25118 Atherosclerotic heart disease of native coronary artery with other forms of angina pectoris: Secondary | ICD-10-CM | POA: Diagnosis not present

## 2015-11-24 DIAGNOSIS — I255 Ischemic cardiomyopathy: Secondary | ICD-10-CM | POA: Diagnosis not present

## 2015-11-24 DIAGNOSIS — I5022 Chronic systolic (congestive) heart failure: Secondary | ICD-10-CM | POA: Diagnosis not present

## 2015-11-24 DIAGNOSIS — I10 Essential (primary) hypertension: Secondary | ICD-10-CM | POA: Diagnosis not present

## 2015-11-24 DIAGNOSIS — N183 Chronic kidney disease, stage 3 (moderate): Secondary | ICD-10-CM | POA: Diagnosis not present

## 2015-11-24 DIAGNOSIS — I208 Other forms of angina pectoris: Secondary | ICD-10-CM | POA: Diagnosis not present

## 2015-11-24 DIAGNOSIS — E782 Mixed hyperlipidemia: Secondary | ICD-10-CM | POA: Diagnosis not present

## 2015-11-28 ENCOUNTER — Other Ambulatory Visit (HOSPITAL_COMMUNITY): Payer: Self-pay | Admitting: Family Medicine

## 2015-11-28 ENCOUNTER — Ambulatory Visit (HOSPITAL_COMMUNITY)
Admission: RE | Admit: 2015-11-28 | Discharge: 2015-11-28 | Disposition: A | Discharge status: Home / Self Care | Payer: Medicare Other | Source: Ambulatory Visit | Attending: Family Medicine | Admitting: Family Medicine

## 2015-11-28 DIAGNOSIS — Z1389 Encounter for screening for other disorder: Secondary | ICD-10-CM | POA: Diagnosis not present

## 2015-11-28 DIAGNOSIS — Z6827 Body mass index (BMI) 27.0-27.9, adult: Secondary | ICD-10-CM | POA: Diagnosis not present

## 2015-11-28 DIAGNOSIS — J310 Chronic rhinitis: Secondary | ICD-10-CM | POA: Diagnosis not present

## 2015-11-28 DIAGNOSIS — J209 Acute bronchitis, unspecified: Secondary | ICD-10-CM

## 2015-11-28 DIAGNOSIS — J449 Chronic obstructive pulmonary disease, unspecified: Secondary | ICD-10-CM

## 2015-11-28 DIAGNOSIS — E119 Type 2 diabetes mellitus without complications: Secondary | ICD-10-CM | POA: Diagnosis not present

## 2015-11-28 DIAGNOSIS — R05 Cough: Secondary | ICD-10-CM | POA: Diagnosis not present

## 2015-11-28 DIAGNOSIS — R0602 Shortness of breath: Secondary | ICD-10-CM | POA: Diagnosis not present

## 2015-12-01 DIAGNOSIS — I255 Ischemic cardiomyopathy: Secondary | ICD-10-CM | POA: Diagnosis not present

## 2015-12-01 DIAGNOSIS — I208 Other forms of angina pectoris: Secondary | ICD-10-CM | POA: Diagnosis not present

## 2015-12-01 DIAGNOSIS — I5022 Chronic systolic (congestive) heart failure: Secondary | ICD-10-CM | POA: Diagnosis not present

## 2015-12-01 DIAGNOSIS — I10 Essential (primary) hypertension: Secondary | ICD-10-CM | POA: Diagnosis not present

## 2015-12-01 DIAGNOSIS — E782 Mixed hyperlipidemia: Secondary | ICD-10-CM | POA: Diagnosis not present

## 2015-12-01 DIAGNOSIS — I25118 Atherosclerotic heart disease of native coronary artery with other forms of angina pectoris: Secondary | ICD-10-CM | POA: Diagnosis not present

## 2016-01-05 DIAGNOSIS — N281 Cyst of kidney, acquired: Secondary | ICD-10-CM | POA: Diagnosis not present

## 2016-01-05 DIAGNOSIS — N401 Enlarged prostate with lower urinary tract symptoms: Secondary | ICD-10-CM | POA: Diagnosis not present

## 2016-01-05 DIAGNOSIS — N183 Chronic kidney disease, stage 3 (moderate): Secondary | ICD-10-CM | POA: Diagnosis not present

## 2016-01-05 DIAGNOSIS — N3289 Other specified disorders of bladder: Secondary | ICD-10-CM | POA: Diagnosis not present

## 2016-01-05 DIAGNOSIS — C642 Malignant neoplasm of left kidney, except renal pelvis: Secondary | ICD-10-CM | POA: Diagnosis not present

## 2016-01-16 DIAGNOSIS — N183 Chronic kidney disease, stage 3 (moderate): Secondary | ICD-10-CM | POA: Diagnosis not present

## 2016-01-16 DIAGNOSIS — N3289 Other specified disorders of bladder: Secondary | ICD-10-CM | POA: Diagnosis not present

## 2016-01-16 DIAGNOSIS — N401 Enlarged prostate with lower urinary tract symptoms: Secondary | ICD-10-CM | POA: Diagnosis not present

## 2016-01-16 DIAGNOSIS — C642 Malignant neoplasm of left kidney, except renal pelvis: Secondary | ICD-10-CM | POA: Diagnosis not present

## 2016-01-16 DIAGNOSIS — N281 Cyst of kidney, acquired: Secondary | ICD-10-CM | POA: Diagnosis not present

## 2016-01-19 DIAGNOSIS — N210 Calculus in bladder: Secondary | ICD-10-CM | POA: Diagnosis not present

## 2016-01-27 DIAGNOSIS — B351 Tinea unguium: Secondary | ICD-10-CM | POA: Diagnosis not present

## 2016-01-27 DIAGNOSIS — M79676 Pain in unspecified toe(s): Secondary | ICD-10-CM | POA: Diagnosis not present

## 2016-02-03 DIAGNOSIS — E119 Type 2 diabetes mellitus without complications: Secondary | ICD-10-CM | POA: Diagnosis not present

## 2016-02-03 DIAGNOSIS — I2510 Atherosclerotic heart disease of native coronary artery without angina pectoris: Secondary | ICD-10-CM | POA: Diagnosis not present

## 2016-02-03 DIAGNOSIS — E785 Hyperlipidemia, unspecified: Secondary | ICD-10-CM | POA: Diagnosis not present

## 2016-02-03 DIAGNOSIS — E039 Hypothyroidism, unspecified: Secondary | ICD-10-CM | POA: Diagnosis not present

## 2016-02-03 DIAGNOSIS — C4442 Squamous cell carcinoma of skin of scalp and neck: Secondary | ICD-10-CM | POA: Diagnosis not present

## 2016-02-03 DIAGNOSIS — I10 Essential (primary) hypertension: Secondary | ICD-10-CM | POA: Diagnosis not present

## 2016-02-03 DIAGNOSIS — L570 Actinic keratosis: Secondary | ICD-10-CM | POA: Diagnosis not present

## 2016-02-03 DIAGNOSIS — D485 Neoplasm of uncertain behavior of skin: Secondary | ICD-10-CM | POA: Diagnosis not present

## 2016-02-03 DIAGNOSIS — Z87891 Personal history of nicotine dependence: Secondary | ICD-10-CM | POA: Diagnosis not present

## 2016-02-03 DIAGNOSIS — Z85828 Personal history of other malignant neoplasm of skin: Secondary | ICD-10-CM | POA: Diagnosis not present

## 2016-02-03 DIAGNOSIS — C4482 Squamous cell carcinoma of overlapping sites of skin: Secondary | ICD-10-CM | POA: Diagnosis not present

## 2016-02-03 DIAGNOSIS — J449 Chronic obstructive pulmonary disease, unspecified: Secondary | ICD-10-CM | POA: Diagnosis not present

## 2016-02-18 DIAGNOSIS — Z888 Allergy status to other drugs, medicaments and biological substances status: Secondary | ICD-10-CM | POA: Diagnosis not present

## 2016-02-18 DIAGNOSIS — Z87891 Personal history of nicotine dependence: Secondary | ICD-10-CM | POA: Diagnosis not present

## 2016-02-18 DIAGNOSIS — C4442 Squamous cell carcinoma of skin of scalp and neck: Secondary | ICD-10-CM | POA: Diagnosis not present

## 2016-02-18 DIAGNOSIS — Z886 Allergy status to analgesic agent status: Secondary | ICD-10-CM | POA: Diagnosis not present

## 2016-02-26 DIAGNOSIS — Z483 Aftercare following surgery for neoplasm: Secondary | ICD-10-CM | POA: Diagnosis not present

## 2016-03-15 DIAGNOSIS — D3A00 Benign carcinoid tumor of unspecified site: Secondary | ICD-10-CM | POA: Diagnosis not present

## 2016-03-15 DIAGNOSIS — D472 Monoclonal gammopathy: Secondary | ICD-10-CM | POA: Diagnosis not present

## 2016-03-15 DIAGNOSIS — R040 Epistaxis: Secondary | ICD-10-CM | POA: Diagnosis not present

## 2016-03-22 ENCOUNTER — Ambulatory Visit (INDEPENDENT_AMBULATORY_CARE_PROVIDER_SITE_OTHER): Payer: Medicare Other | Admitting: Internal Medicine

## 2016-03-23 ENCOUNTER — Encounter (INDEPENDENT_AMBULATORY_CARE_PROVIDER_SITE_OTHER): Payer: Self-pay | Admitting: *Deleted

## 2016-03-23 ENCOUNTER — Ambulatory Visit (INDEPENDENT_AMBULATORY_CARE_PROVIDER_SITE_OTHER): Payer: Medicare Other | Admitting: Internal Medicine

## 2016-03-23 ENCOUNTER — Encounter (INDEPENDENT_AMBULATORY_CARE_PROVIDER_SITE_OTHER): Payer: Self-pay | Admitting: Internal Medicine

## 2016-03-23 VITALS — BP 126/67 | HR 74 | Temp 97.7°F | Resp 18 | Ht 74.0 in | Wt 205.5 lb

## 2016-03-23 DIAGNOSIS — R1310 Dysphagia, unspecified: Secondary | ICD-10-CM

## 2016-03-23 DIAGNOSIS — R1314 Dysphagia, pharyngoesophageal phase: Secondary | ICD-10-CM | POA: Diagnosis not present

## 2016-03-23 DIAGNOSIS — R1319 Other dysphagia: Secondary | ICD-10-CM

## 2016-03-23 DIAGNOSIS — K219 Gastro-esophageal reflux disease without esophagitis: Secondary | ICD-10-CM

## 2016-03-23 MED ORDER — PANTOPRAZOLE SODIUM 40 MG PO TBEC
40.0000 mg | DELAYED_RELEASE_TABLET | Freq: Every day | ORAL | Status: DC
Start: 2016-03-23 — End: 2017-07-26

## 2016-03-24 NOTE — Progress Notes (Signed)
Presenting complaint;  Solid food dysphagia.  Subjective:  Patient is 82 year old Caucasian male who presents for scheduled visit. He continues to complain of dysphagia to solids. He states this symptom has gotten worse. He has difficulty every day. He has most difficulty with meats. He points to suprasternal area soft bolus obstruction. Food bolus eventually goes down after a few minutes. He says heartburn is well controlled with PPI. He feels heartburn is 100% controlled with medication. He denies abdominal pain melena or rectal bleeding. He has one to 2 bowel movements per day. He was seen 6 months ago and underwent barium study which suggested esophageal motility disorder. Patient also complains of daily nosebleed. He states he has seen 30 ENT specialist over the last 15 years including 2 at [facility] and one at [facility]. No treatment has helped. He said he had surgery to remove scalp cancer about 6 weeks ago.   Current Medications: Outpatient Encounter Prescriptions as of 03/23/2016  Medication Sig  . albuterol-ipratropium (COMBIVENT) 18-103 MCG/ACT inhaler Inhale 2-4 puffs into the lungs 2 (two) times daily as needed for wheezing.   . carvedilol (COREG) 6.25 MG tablet Take 6.25 mg by mouth 2 (two) times daily with a meal.    . Cholecalciferol (VITAMIN D3) 5000 UNITS CAPS Take 5,000 Units by mouth daily.   . fluticasone (FLONASE) 50 MCG/ACT nasal spray Place 2 sprays into both nostrils daily.   . furosemide (LASIX) 40 MG tablet Take 20 mg by mouth 2 (two) times daily.   Marland Kitchen ipratropium (ATROVENT) 0.03 % nasal spray Place 2 sprays into both nostrils 3 (three) times daily.   Marland Kitchen levothyroxine (SYNTHROID, LEVOTHROID) 125 MCG tablet Take 125 mcg by mouth daily.   . montelukast (SINGULAIR) 10 MG tablet Take 10 mg by mouth at bedtime.   . multivitamin-iron-minerals-folic acid (THERAPEUTIC-M) TABS tablet Take 1 tablet by mouth daily.  . nitroGLYCERIN (NITRODUR - DOSED IN MG/24 HR) 0.6 mg/hr patch Place 0.6 mg  onto the skin daily.  . nitroGLYCERIN (NITROSTAT) 0.4 MG SL tablet Place 1 tablet (0.4 mg total) under the tongue as needed. (Patient taking differently: Place 0.4 mg under the tongue as needed for chest pain. )  . Omega-3 Fatty Acids (FISH OIL) 1000 MG CPDR Take 1,000 mg by mouth daily.  . pantoprazole (PROTONIX) 40 MG tablet Take 1 tablet (40 mg total) by mouth daily before breakfast.  . pravastatin (PRAVACHOL) 20 MG tablet Take 20 mg by mouth at bedtime.  Marland Kitchen tiotropium (SPIRIVA) 18 MCG inhalation capsule Place 18 mcg into inhaler and inhale daily.  . vitamin B-12 (CYANOCOBALAMIN) 1000 MCG tablet Take 1,000 mcg by mouth daily.    . [DISCONTINUED] pantoprazole (PROTONIX) 40 MG tablet Take 1 tablet (40 mg total) by mouth 2 (two) times daily before a meal.  . [DISCONTINUED] ALPRAZolam (XANAX) 0.5 MG tablet Take 0.5 mg by mouth at bedtime as needed for anxiety. Reported on 03/23/2016  . [DISCONTINUED] ipratropium (ATROVENT) 0.03 % nasal spray Reported on 03/23/2016  . [DISCONTINUED] zolpidem (AMBIEN) 10 MG tablet Take 10 mg by mouth at bedtime as needed for sleep. Reported on 03/23/2016   No facility-administered encounter medications on file as of 03/23/2016.     Objective: Blood pressure 126/67, pulse 74, temperature 97.7 F (36.5 C), temperature source Oral, resp. rate 18, height 6\' 2"  (1.88 m), weight 205 lb 8 oz (93.214 kg). Patient is alert and in no acute distress. Conjunctiva is pink. Sclera is nonicteric Oropharyngeal mucosa is normal. Dentition in satisfactory condition. No  neck masses or thyromegaly noted. Cardiac exam with regular rhythm normal S1 and S2. No murmur or gallop noted. Lungs are clear to auscultation. Abdomen full but soft and nontender without organomegaly or masses. No LE edema or clubbing noted.    Assessment:  #1. Solid food dysphagia. He possibly has esophageal motility disorder. Last barium pill esophagogram was in January 2017 and suggested this diagnosis.  Will repeat the studies to make sure he has not developed esophageal stricture which would BM and able to endoscopic therapy.  #2. GERD. He is doing well with double dose PPI. Will try him on lesser dose.   Plan:  Barium pill esophagogram. Decrease pantoprazole to 40 mg by mouth every morning. Office visit in one year.

## 2016-03-29 DIAGNOSIS — D696 Thrombocytopenia, unspecified: Secondary | ICD-10-CM | POA: Diagnosis not present

## 2016-03-29 DIAGNOSIS — D472 Monoclonal gammopathy: Secondary | ICD-10-CM | POA: Diagnosis not present

## 2016-03-29 DIAGNOSIS — R1310 Dysphagia, unspecified: Secondary | ICD-10-CM | POA: Diagnosis not present

## 2016-03-29 DIAGNOSIS — K449 Diaphragmatic hernia without obstruction or gangrene: Secondary | ICD-10-CM | POA: Diagnosis not present

## 2016-03-29 DIAGNOSIS — D72810 Lymphocytopenia: Secondary | ICD-10-CM | POA: Diagnosis not present

## 2016-04-02 DIAGNOSIS — J209 Acute bronchitis, unspecified: Secondary | ICD-10-CM | POA: Diagnosis not present

## 2016-04-02 DIAGNOSIS — E663 Overweight: Secondary | ICD-10-CM | POA: Diagnosis not present

## 2016-04-02 DIAGNOSIS — J069 Acute upper respiratory infection, unspecified: Secondary | ICD-10-CM | POA: Diagnosis not present

## 2016-04-02 DIAGNOSIS — Z6829 Body mass index (BMI) 29.0-29.9, adult: Secondary | ICD-10-CM | POA: Diagnosis not present

## 2016-04-06 DIAGNOSIS — M79676 Pain in unspecified toe(s): Secondary | ICD-10-CM | POA: Diagnosis not present

## 2016-04-06 DIAGNOSIS — B351 Tinea unguium: Secondary | ICD-10-CM | POA: Diagnosis not present

## 2016-04-07 ENCOUNTER — Encounter (INDEPENDENT_AMBULATORY_CARE_PROVIDER_SITE_OTHER): Payer: Self-pay

## 2016-05-26 DIAGNOSIS — Z483 Aftercare following surgery for neoplasm: Secondary | ICD-10-CM | POA: Diagnosis not present

## 2016-05-26 DIAGNOSIS — Z9889 Other specified postprocedural states: Secondary | ICD-10-CM | POA: Diagnosis not present

## 2016-05-26 DIAGNOSIS — Z08 Encounter for follow-up examination after completed treatment for malignant neoplasm: Secondary | ICD-10-CM | POA: Diagnosis not present

## 2016-05-26 DIAGNOSIS — L570 Actinic keratosis: Secondary | ICD-10-CM | POA: Diagnosis not present

## 2016-05-26 DIAGNOSIS — Z85828 Personal history of other malignant neoplasm of skin: Secondary | ICD-10-CM | POA: Diagnosis not present

## 2016-05-26 DIAGNOSIS — Z8589 Personal history of malignant neoplasm of other organs and systems: Secondary | ICD-10-CM | POA: Diagnosis not present

## 2016-05-31 DIAGNOSIS — E119 Type 2 diabetes mellitus without complications: Secondary | ICD-10-CM | POA: Diagnosis not present

## 2016-05-31 DIAGNOSIS — E782 Mixed hyperlipidemia: Secondary | ICD-10-CM | POA: Diagnosis not present

## 2016-05-31 DIAGNOSIS — I5022 Chronic systolic (congestive) heart failure: Secondary | ICD-10-CM | POA: Diagnosis not present

## 2016-05-31 DIAGNOSIS — I208 Other forms of angina pectoris: Secondary | ICD-10-CM | POA: Diagnosis not present

## 2016-05-31 DIAGNOSIS — I25118 Atherosclerotic heart disease of native coronary artery with other forms of angina pectoris: Secondary | ICD-10-CM | POA: Diagnosis not present

## 2016-05-31 DIAGNOSIS — N183 Chronic kidney disease, stage 3 (moderate): Secondary | ICD-10-CM | POA: Diagnosis not present

## 2016-05-31 DIAGNOSIS — J439 Emphysema, unspecified: Secondary | ICD-10-CM | POA: Diagnosis not present

## 2016-05-31 DIAGNOSIS — I10 Essential (primary) hypertension: Secondary | ICD-10-CM | POA: Diagnosis not present

## 2016-05-31 DIAGNOSIS — I255 Ischemic cardiomyopathy: Secondary | ICD-10-CM | POA: Diagnosis not present

## 2016-06-15 DIAGNOSIS — B351 Tinea unguium: Secondary | ICD-10-CM | POA: Diagnosis not present

## 2016-06-15 DIAGNOSIS — M79676 Pain in unspecified toe(s): Secondary | ICD-10-CM | POA: Diagnosis not present

## 2016-07-12 DIAGNOSIS — N183 Chronic kidney disease, stage 3 (moderate): Secondary | ICD-10-CM | POA: Diagnosis not present

## 2016-07-12 DIAGNOSIS — N281 Cyst of kidney, acquired: Secondary | ICD-10-CM | POA: Diagnosis not present

## 2016-07-12 DIAGNOSIS — N401 Enlarged prostate with lower urinary tract symptoms: Secondary | ICD-10-CM | POA: Diagnosis not present

## 2016-07-12 DIAGNOSIS — R3912 Poor urinary stream: Secondary | ICD-10-CM | POA: Diagnosis not present

## 2016-07-12 DIAGNOSIS — C642 Malignant neoplasm of left kidney, except renal pelvis: Secondary | ICD-10-CM | POA: Diagnosis not present

## 2016-08-03 ENCOUNTER — Other Ambulatory Visit (HOSPITAL_COMMUNITY): Payer: Self-pay | Admitting: Family Medicine

## 2016-08-03 ENCOUNTER — Ambulatory Visit (HOSPITAL_COMMUNITY)
Admission: RE | Admit: 2016-08-03 | Discharge: 2016-08-03 | Disposition: A | Discharge status: Home / Self Care | Payer: Medicare Other | Source: Ambulatory Visit | Attending: Family Medicine | Admitting: Family Medicine

## 2016-08-03 DIAGNOSIS — S93401A Sprain of unspecified ligament of right ankle, initial encounter: Secondary | ICD-10-CM | POA: Diagnosis present

## 2016-08-03 DIAGNOSIS — I10 Essential (primary) hypertension: Secondary | ICD-10-CM | POA: Diagnosis not present

## 2016-08-03 DIAGNOSIS — X58XXXA Exposure to other specified factors, initial encounter: Secondary | ICD-10-CM | POA: Diagnosis not present

## 2016-08-03 DIAGNOSIS — M7989 Other specified soft tissue disorders: Secondary | ICD-10-CM | POA: Insufficient documentation

## 2016-08-03 DIAGNOSIS — R5383 Other fatigue: Secondary | ICD-10-CM | POA: Diagnosis not present

## 2016-08-03 DIAGNOSIS — E063 Autoimmune thyroiditis: Secondary | ICD-10-CM | POA: Diagnosis not present

## 2016-08-03 DIAGNOSIS — E119 Type 2 diabetes mellitus without complications: Secondary | ICD-10-CM | POA: Diagnosis not present

## 2016-08-03 DIAGNOSIS — Z0000 Encounter for general adult medical examination without abnormal findings: Secondary | ICD-10-CM | POA: Diagnosis not present

## 2016-08-03 DIAGNOSIS — Z6827 Body mass index (BMI) 27.0-27.9, adult: Secondary | ICD-10-CM | POA: Diagnosis not present

## 2016-08-03 DIAGNOSIS — Z125 Encounter for screening for malignant neoplasm of prostate: Secondary | ICD-10-CM | POA: Diagnosis not present

## 2016-08-03 DIAGNOSIS — S8252XA Displaced fracture of medial malleolus of left tibia, initial encounter for closed fracture: Secondary | ICD-10-CM | POA: Insufficient documentation

## 2016-08-03 DIAGNOSIS — N183 Chronic kidney disease, stage 3 (moderate): Secondary | ICD-10-CM | POA: Diagnosis not present

## 2016-08-03 DIAGNOSIS — E538 Deficiency of other specified B group vitamins: Secondary | ICD-10-CM | POA: Diagnosis not present

## 2016-08-03 DIAGNOSIS — T148XXA Other injury of unspecified body region, initial encounter: Secondary | ICD-10-CM

## 2016-08-03 DIAGNOSIS — Z1389 Encounter for screening for other disorder: Secondary | ICD-10-CM | POA: Diagnosis not present

## 2016-08-03 DIAGNOSIS — E782 Mixed hyperlipidemia: Secondary | ICD-10-CM | POA: Diagnosis not present

## 2016-08-03 DIAGNOSIS — E039 Hypothyroidism, unspecified: Secondary | ICD-10-CM | POA: Diagnosis not present

## 2016-08-31 DIAGNOSIS — B351 Tinea unguium: Secondary | ICD-10-CM | POA: Diagnosis not present

## 2016-08-31 DIAGNOSIS — M79676 Pain in unspecified toe(s): Secondary | ICD-10-CM | POA: Diagnosis not present

## 2016-09-03 ENCOUNTER — Inpatient Hospital Stay (HOSPITAL_COMMUNITY)
Admission: EM | Admit: 2016-09-03 | Discharge: 2016-09-13 | DRG: 820 | Disposition: A | Discharge status: Home Health | Payer: Medicare Other | Attending: Internal Medicine | Admitting: Internal Medicine

## 2016-09-03 ENCOUNTER — Emergency Department (HOSPITAL_COMMUNITY): Payer: Medicare Other

## 2016-09-03 ENCOUNTER — Encounter (HOSPITAL_COMMUNITY): Payer: Self-pay | Admitting: Emergency Medicine

## 2016-09-03 DIAGNOSIS — R05 Cough: Secondary | ICD-10-CM | POA: Diagnosis not present

## 2016-09-03 DIAGNOSIS — M79671 Pain in right foot: Secondary | ICD-10-CM

## 2016-09-03 DIAGNOSIS — R531 Weakness: Secondary | ICD-10-CM | POA: Diagnosis not present

## 2016-09-03 DIAGNOSIS — M79672 Pain in left foot: Secondary | ICD-10-CM

## 2016-09-03 DIAGNOSIS — L03115 Cellulitis of right lower limb: Secondary | ICD-10-CM | POA: Diagnosis present

## 2016-09-03 DIAGNOSIS — I2510 Atherosclerotic heart disease of native coronary artery without angina pectoris: Secondary | ICD-10-CM | POA: Diagnosis present

## 2016-09-03 DIAGNOSIS — E785 Hyperlipidemia, unspecified: Secondary | ICD-10-CM | POA: Diagnosis present

## 2016-09-03 DIAGNOSIS — E1122 Type 2 diabetes mellitus with diabetic chronic kidney disease: Secondary | ICD-10-CM | POA: Diagnosis present

## 2016-09-03 DIAGNOSIS — M25472 Effusion, left ankle: Secondary | ICD-10-CM | POA: Diagnosis present

## 2016-09-03 DIAGNOSIS — I5042 Chronic combined systolic (congestive) and diastolic (congestive) heart failure: Secondary | ICD-10-CM | POA: Diagnosis present

## 2016-09-03 DIAGNOSIS — N184 Chronic kidney disease, stage 4 (severe): Secondary | ICD-10-CM | POA: Diagnosis present

## 2016-09-03 DIAGNOSIS — I10 Essential (primary) hypertension: Secondary | ICD-10-CM

## 2016-09-03 DIAGNOSIS — D72821 Monocytosis (symptomatic): Secondary | ICD-10-CM | POA: Diagnosis not present

## 2016-09-03 DIAGNOSIS — I255 Ischemic cardiomyopathy: Secondary | ICD-10-CM | POA: Diagnosis present

## 2016-09-03 DIAGNOSIS — I5023 Acute on chronic systolic (congestive) heart failure: Secondary | ICD-10-CM | POA: Diagnosis present

## 2016-09-03 DIAGNOSIS — D649 Anemia, unspecified: Secondary | ICD-10-CM | POA: Diagnosis not present

## 2016-09-03 DIAGNOSIS — M7989 Other specified soft tissue disorders: Secondary | ICD-10-CM | POA: Diagnosis not present

## 2016-09-03 DIAGNOSIS — Z809 Family history of malignant neoplasm, unspecified: Secondary | ICD-10-CM

## 2016-09-03 DIAGNOSIS — D6959 Other secondary thrombocytopenia: Secondary | ICD-10-CM | POA: Diagnosis present

## 2016-09-03 DIAGNOSIS — D472 Monoclonal gammopathy: Secondary | ICD-10-CM | POA: Diagnosis present

## 2016-09-03 DIAGNOSIS — I5043 Acute on chronic combined systolic (congestive) and diastolic (congestive) heart failure: Secondary | ICD-10-CM | POA: Diagnosis not present

## 2016-09-03 DIAGNOSIS — C9200 Acute myeloblastic leukemia, not having achieved remission: Principal | ICD-10-CM | POA: Diagnosis present

## 2016-09-03 DIAGNOSIS — I472 Ventricular tachycardia: Secondary | ICD-10-CM | POA: Diagnosis not present

## 2016-09-03 DIAGNOSIS — R040 Epistaxis: Secondary | ICD-10-CM | POA: Diagnosis present

## 2016-09-03 DIAGNOSIS — I480 Paroxysmal atrial fibrillation: Secondary | ICD-10-CM | POA: Diagnosis not present

## 2016-09-03 DIAGNOSIS — N2889 Other specified disorders of kidney and ureter: Secondary | ICD-10-CM | POA: Diagnosis present

## 2016-09-03 DIAGNOSIS — D696 Thrombocytopenia, unspecified: Secondary | ICD-10-CM | POA: Diagnosis present

## 2016-09-03 DIAGNOSIS — L0390 Cellulitis, unspecified: Secondary | ICD-10-CM | POA: Diagnosis not present

## 2016-09-03 DIAGNOSIS — R591 Generalized enlarged lymph nodes: Secondary | ICD-10-CM | POA: Diagnosis present

## 2016-09-03 DIAGNOSIS — R9431 Abnormal electrocardiogram [ECG] [EKG]: Secondary | ICD-10-CM

## 2016-09-03 DIAGNOSIS — I509 Heart failure, unspecified: Secondary | ICD-10-CM | POA: Diagnosis not present

## 2016-09-03 DIAGNOSIS — R5383 Other fatigue: Secondary | ICD-10-CM | POA: Diagnosis not present

## 2016-09-03 DIAGNOSIS — J9601 Acute respiratory failure with hypoxia: Secondary | ICD-10-CM | POA: Diagnosis present

## 2016-09-03 DIAGNOSIS — C9300 Acute monoblastic/monocytic leukemia, not having achieved remission: Secondary | ICD-10-CM | POA: Diagnosis not present

## 2016-09-03 DIAGNOSIS — Z8249 Family history of ischemic heart disease and other diseases of the circulatory system: Secondary | ICD-10-CM

## 2016-09-03 DIAGNOSIS — Z85528 Personal history of other malignant neoplasm of kidney: Secondary | ICD-10-CM

## 2016-09-03 DIAGNOSIS — N189 Chronic kidney disease, unspecified: Secondary | ICD-10-CM

## 2016-09-03 DIAGNOSIS — I130 Hypertensive heart and chronic kidney disease with heart failure and stage 1 through stage 4 chronic kidney disease, or unspecified chronic kidney disease: Secondary | ICD-10-CM | POA: Diagnosis present

## 2016-09-03 DIAGNOSIS — E119 Type 2 diabetes mellitus without complications: Secondary | ICD-10-CM | POA: Diagnosis not present

## 2016-09-03 DIAGNOSIS — N1832 Chronic kidney disease, stage 3b: Secondary | ICD-10-CM | POA: Diagnosis present

## 2016-09-03 DIAGNOSIS — I252 Old myocardial infarction: Secondary | ICD-10-CM | POA: Diagnosis not present

## 2016-09-03 DIAGNOSIS — E790 Hyperuricemia without signs of inflammatory arthritis and tophaceous disease: Secondary | ICD-10-CM

## 2016-09-03 DIAGNOSIS — S99921A Unspecified injury of right foot, initial encounter: Secondary | ICD-10-CM | POA: Diagnosis not present

## 2016-09-03 DIAGNOSIS — J181 Lobar pneumonia, unspecified organism: Secondary | ICD-10-CM | POA: Diagnosis not present

## 2016-09-03 DIAGNOSIS — E039 Hypothyroidism, unspecified: Secondary | ICD-10-CM | POA: Diagnosis present

## 2016-09-03 DIAGNOSIS — Z87891 Personal history of nicotine dependence: Secondary | ICD-10-CM

## 2016-09-03 DIAGNOSIS — J440 Chronic obstructive pulmonary disease with acute lower respiratory infection: Secondary | ICD-10-CM | POA: Diagnosis present

## 2016-09-03 DIAGNOSIS — Z79899 Other long term (current) drug therapy: Secondary | ICD-10-CM

## 2016-09-03 DIAGNOSIS — R778 Other specified abnormalities of plasma proteins: Secondary | ICD-10-CM

## 2016-09-03 DIAGNOSIS — R29898 Other symptoms and signs involving the musculoskeletal system: Secondary | ICD-10-CM

## 2016-09-03 DIAGNOSIS — Z9181 History of falling: Secondary | ICD-10-CM

## 2016-09-03 DIAGNOSIS — M25552 Pain in left hip: Secondary | ICD-10-CM | POA: Diagnosis not present

## 2016-09-03 DIAGNOSIS — R748 Abnormal levels of other serum enzymes: Secondary | ICD-10-CM | POA: Diagnosis not present

## 2016-09-03 DIAGNOSIS — R7989 Other specified abnormal findings of blood chemistry: Secondary | ICD-10-CM

## 2016-09-03 DIAGNOSIS — N179 Acute kidney failure, unspecified: Secondary | ICD-10-CM | POA: Diagnosis not present

## 2016-09-03 DIAGNOSIS — D630 Anemia in neoplastic disease: Secondary | ICD-10-CM | POA: Diagnosis present

## 2016-09-03 DIAGNOSIS — Z91041 Radiographic dye allergy status: Secondary | ICD-10-CM

## 2016-09-03 DIAGNOSIS — I4891 Unspecified atrial fibrillation: Secondary | ICD-10-CM | POA: Diagnosis not present

## 2016-09-03 DIAGNOSIS — D72829 Elevated white blood cell count, unspecified: Secondary | ICD-10-CM

## 2016-09-03 DIAGNOSIS — Z888 Allergy status to other drugs, medicaments and biological substances status: Secondary | ICD-10-CM

## 2016-09-03 DIAGNOSIS — I2583 Coronary atherosclerosis due to lipid rich plaque: Secondary | ICD-10-CM

## 2016-09-03 DIAGNOSIS — Z886 Allergy status to analgesic agent status: Secondary | ICD-10-CM

## 2016-09-03 DIAGNOSIS — C92A0 Acute myeloid leukemia with multilineage dysplasia, not having achieved remission: Secondary | ICD-10-CM | POA: Diagnosis not present

## 2016-09-03 LAB — URINALYSIS, ROUTINE W REFLEX MICROSCOPIC
BILIRUBIN URINE: NEGATIVE
GLUCOSE, UA: NEGATIVE mg/dL
KETONES UR: NEGATIVE mg/dL
Leukocytes, UA: NEGATIVE
Nitrite: NEGATIVE
PROTEIN: 30 mg/dL — AB
Specific Gravity, Urine: 1.02 (ref 1.005–1.030)
pH: 5 (ref 5.0–8.0)

## 2016-09-03 LAB — LACTIC ACID, PLASMA: LACTIC ACID, VENOUS: 1.5 mmol/L (ref 0.5–1.9)

## 2016-09-03 LAB — BASIC METABOLIC PANEL
Anion gap: 10 (ref 5–15)
BUN: 41 mg/dL — ABNORMAL HIGH (ref 6–20)
CALCIUM: 9.5 mg/dL (ref 8.9–10.3)
CO2: 26 mmol/L (ref 22–32)
Chloride: 108 mmol/L (ref 101–111)
Creatinine, Ser: 2.53 mg/dL — ABNORMAL HIGH (ref 0.61–1.24)
GFR, EST AFRICAN AMERICAN: 24 mL/min — AB (ref >60)
GFR, EST NON AFRICAN AMERICAN: 21 mL/min — AB (ref >60)
Glucose, Bld: 117 mg/dL — ABNORMAL HIGH (ref 65–99)
Potassium: 3.6 mmol/L (ref 3.5–5.1)
Sodium: 144 mmol/L (ref 135–145)

## 2016-09-03 LAB — CBC WITH DIFFERENTIAL/PLATELET
BASOS ABS: 0 10*3/uL (ref 0.0–0.1)
Basophils Relative: 0 %
EOS ABS: 0 10*3/uL (ref 0.0–0.7)
Eosinophils Relative: 0 %
HCT: 35.2 % — ABNORMAL LOW (ref 39.0–52.0)
Hemoglobin: 12 g/dL — ABNORMAL LOW (ref 13.0–17.0)
LYMPHS ABS: 3 10*3/uL (ref 0.7–4.0)
Lymphocytes Relative: 11 %
MCH: 30 pg (ref 26.0–34.0)
MCHC: 34.1 g/dL (ref 30.0–36.0)
MCV: 88 fL (ref 78.0–100.0)
MONOS PCT: 82 %
Monocytes Absolute: 22.2 10*3/uL — ABNORMAL HIGH (ref 0.1–1.0)
Neutro Abs: 1.9 10*3/uL (ref 1.7–7.7)
Neutrophils Relative %: 7 %
PLATELETS: 101 10*3/uL — AB (ref 150–400)
RBC: 4 MIL/uL — AB (ref 4.22–5.81)
RDW: 18 % — AB (ref 11.5–15.5)
WBC: 27.1 10*3/uL — AB (ref 4.0–10.5)

## 2016-09-03 LAB — URINE MICROSCOPIC-ADD ON

## 2016-09-03 LAB — BRAIN NATRIURETIC PEPTIDE: B NATRIURETIC PEPTIDE 5: 670.2 pg/mL — AB (ref 0.0–100.0)

## 2016-09-03 LAB — TROPONIN I: TROPONIN I: 0.79 ng/mL — AB (ref <0.03)

## 2016-09-03 NOTE — ED Notes (Signed)
Dr. Danford, hospitalist, at bedside. 

## 2016-09-03 NOTE — ED Provider Notes (Signed)
[facility] DEPT Provider Note   CSN: [id number] Arrival date & time: 09/03/16  1456  By signing my name below, I, Brian Mcmillan, attest that this documentation has been prepared under the direction and in the presence of Charlann Lange, PA-C. Electronically Signed: Hansel Mcmillan, ED Scribe. 09/03/16. 8:47 PM.    History   Chief Complaint Chief Complaint  Patient presents with  . Fall  . Leg Swelling    HPI Brian Mcmillan is a 82 y.o. male who presents to the Emergency Department complaining of moderate, bilateral ankle and foot pain s/p fall that occurred 3 days ago. Pt states he was getting out of the bed and found his legs slipping slowly on the hardwood floor, eventually causing him to fall and land on top of his legs. Pt denies head injury or LOC. Pt states he was able to get up with assistance and subsequently proceeded to his podiatry appointment that day. He reports he was told by his podiatrist to come to the ER for XR, but waited because of the holiday. Pt has been wheel-chair bound since falling. He also notes a bruise to the upper right arm from the fall, but denies decreased ROM or significant pain. He is also complaining of ankle swelling that has worsened this week from baseline. He is not currently taking any blood thinners. He denies abdominal pain, knee pain, hip pain, CP, neck pain, additional injuries.   Pt also complains of a persistent productive cough with yellow sputum for a month. Pt states he was seen by his PCP 08/02/2016 for the cough, was started on a Z-pak and given a shot of Rocephin. He also states he had CXR at some point during this time. Pt states the Z-pak did not alleviate his cough so he received a refill, which also did not resolve his cough. He also notes he has had an intermittent fever with his symptoms, for which he has not tried any OTC medications. He states he has tried his inhaler for the cough with minimal relief, which he has been prescribed for  other chronic medical conditions.   The history is provided by the patient, a relative and the spouse. No language interpreter was used.    Past Medical History:  Diagnosis Date  . Abdominal mass 2008   chronic inflammatory mass of the mesentary Dr [name]  . CAD (coronary artery disease)    cath 09/23/09 70% Dx1 (diffuse disease), 40% Cx, Large RCA 80-90% with heavy calcification / cath 06/2010 no change tiht RCA still best to treat medially with nosebleeds  . Chest pain    myoview 2006 no ischemia/ myoview 2009 no ischemia  . Dyslipidemia   . Hypertension    no RAS  . Hypothyroidism   . Kidney mass 2008   radiofrequency ablation [facility]  . Left ventricular dysfunction    myoview 2006 normal / myoview 2009 normal / EF 60% cath 09/2009, EF 60% cath 06/2010  . Memory impairment    memory decreasing 06/2010  . Nosebleed    significant, can not take ASA  . Past heart attack    X2  . Rash Sept 2011   rash on buttocks, hospitalized hydralazine stopped but then restarted w/o return of rash  . Systolic click    no mitral valve prolapse    Patient Active Problem List   Diagnosis Date Noted  . Cricopharyngeal dysphagia 11/06/2015  . Esophageal dysphagia 11/06/2015  . Carotid disease, bilateral ([place]) 12/05/2014  . TIA (  transient ischemic attack) 12/04/2014  . MGUS (monoclonal gammopathy of unknown significance) 03/29/2011  . Renal cancer ([place]) 03/29/2011    Class: Diagnosis of  . Shortness of breath 12/24/2010  . EDEMA 11/17/2010  . HEADACHE 10/28/2009  . Coronary atherosclerosis 09/25/2009  . NOSEBLEED 09/22/2009  . Hypothyroidism 06/29/2009  . DYSLIPIDEMIA 06/29/2009  . Essential hypertension 06/29/2009  . CHEST PAIN 06/29/2009    Past Surgical History:  Procedure Laterality Date  . BACK SURGERY    . BONE MARROW ASPIRATION     X2  . BONE MARROW BIOPSY     X2  . COLONOSCOPY N/A 03/07/2013   Procedure: COLONOSCOPY;  Surgeon: [name], MD;  Location: [facility]  ENDO SUITE;  Service: Endoscopy;  Laterality: N/A;  830-moved to [address] Ann to notify pt  . COLONOSCOPY  01/2013  . lymph node removal     right abdomen  . LYMPH NODE removal     robotic laser @ [facility]  on left side of stomach  . RFA RIGHT KIDNEY         Home Medications    Prior to Admission medications   Medication Sig Start Date End Date Taking? Authorizing Provider  albuterol-ipratropium (COMBIVENT) 18-103 MCG/ACT inhaler Inhale 2-4 puffs into the lungs 2 (two) times daily as needed for wheezing.     Historical Provider, MD  carvedilol (COREG) 6.25 MG tablet Take 6.25 mg by mouth 2 (two) times daily with a meal.      Historical Provider, MD  Cholecalciferol (VITAMIN D3) 5000 UNITS CAPS Take 5,000 Units by mouth daily.     Historical Provider, MD  fluticasone (FLONASE) 50 MCG/ACT nasal spray Place 2 sprays into both nostrils daily.  01/02/16   Historical Provider, MD  furosemide (LASIX) 40 MG tablet Take 20 mg by mouth 2 (two) times daily.     Historical Provider, MD  ipratropium (ATROVENT) 0.03 % nasal spray Place 2 sprays into both nostrils 3 (three) times daily.  01/02/16   Historical Provider, MD  levothyroxine (SYNTHROID, LEVOTHROID) 125 MCG tablet Take 125 mcg by mouth daily.     Historical Provider, MD  montelukast (SINGULAIR) 10 MG tablet Take 10 mg by mouth at bedtime.     Historical Provider, MD  multivitamin-iron-minerals-folic acid (THERAPEUTIC-M) TABS tablet Take 1 tablet by mouth daily.    Historical Provider, MD  nitroGLYCERIN (NITRODUR - DOSED IN MG/24 HR) 0.6 mg/hr patch Place 0.6 mg onto the skin daily.    Historical Provider, MD  nitroGLYCERIN (NITROSTAT) 0.4 MG SL tablet Place 1 tablet (0.4 mg total) under the tongue as needed. Patient taking differently: Place 0.4 mg under the tongue as needed for chest pain.  08/19/11   Carlena Bjornstad, MD  Omega-3 Fatty Acids (FISH OIL) 1000 MG CPDR Take 1,000 mg by mouth daily.    Historical Provider, MD  pantoprazole (PROTONIX) 40 MG  tablet Take 1 tablet (40 mg total) by mouth daily before breakfast. 03/23/16   [name], MD  pravastatin (PRAVACHOL) 20 MG tablet Take 20 mg by mouth at bedtime. 12/28/12   Historical Provider, MD  tiotropium (SPIRIVA) 18 MCG inhalation capsule Place 18 mcg into inhaler and inhale daily.    Historical Provider, MD  vitamin B-12 (CYANOCOBALAMIN) 1000 MCG tablet Take 1,000 mcg by mouth daily.      Historical Provider, MD    Family History Family History  Problem Relation Age of Onset  . Cancer Maternal Grandfather   . Coronary artery disease  Social History Social History  Substance Use Topics  . Smoking status: Former Smoker    Packs/day: 0.50    Years: 42.00    Types: Cigarettes    Quit date: 10/11/1980  . Smokeless tobacco: Never Used  . Alcohol use No     Allergies   Ranolazine; Simvastatin; Aspirin; and Contrast media [iodinated diagnostic agents]   Review of Systems Review of Systems  Constitutional: Positive for fever.  HENT: Negative.   Respiratory: Positive for cough.   Cardiovascular: Positive for leg swelling. Negative for chest pain.  Gastrointestinal: Negative for abdominal pain and vomiting.  Genitourinary: Negative.  Negative for dysuria and frequency.  Musculoskeletal: Positive for arthralgias. Negative for neck pain.  Skin: Negative for color change and rash.  Neurological: Negative for syncope and weakness.     Physical Exam Updated Vital Signs BP 161/92 (BP Location: Left Arm)   Pulse 107   Temp 97.5 F (36.4 C) (Oral)   Resp 19   SpO2 94%   Physical Exam  Constitutional: He appears well-developed and well-nourished. No distress.  HENT:  Head: Normocephalic.  Eyes: Conjunctivae are normal.  Neck: Normal range of motion. Neck supple.  Cardiovascular: Normal rate and regular rhythm.   No murmur heard. Pulmonary/Chest: Effort normal. No respiratory distress. He has no wheezes. He has no rales.  Abdominal: Soft. He exhibits no  distension. There is no tenderness. There is no guarding.  Musculoskeletal: Normal range of motion.  There is left greater than right foot swelling.   Neurological: He is alert.  The patient is awake, alert, oriented. Speech clear and focused. CN's 3-12 grossly intact. Coordination without deficit.  Skin: Skin is warm and dry.  Psychiatric: He has a normal mood and affect. His behavior is normal.  Nursing note and vitals reviewed.    ED Treatments / Results   DIAGNOSTIC STUDIES: Oxygen Saturation is 94% on RA, adequate by my interpretation.    COORDINATION OF CARE: 8:38 PM Discussed treatment plan with pt at bedside which includes labs, imaging and pt agreed to plan.    Labs (all labs ordered are listed, but only abnormal results are displayed) Labs Reviewed  BASIC METABOLIC PANEL - Abnormal; Notable for the following:       Result Value   Glucose, Bld 117 (*)    BUN 41 (*)    Creatinine, Ser 2.53 (*)    GFR calc non Af Amer 21 (*)    GFR calc Af Amer 24 (*)    All other components within normal limits  CBC WITH DIFFERENTIAL/PLATELET - Abnormal; Notable for the following:    WBC 27.1 (*)    RBC 4.00 (*)    Hemoglobin 12.0 (*)    HCT 35.2 (*)    RDW 18.0 (*)    Platelets 101 (*)    Monocytes Absolute 22.2 (*)    All other components within normal limits  URINALYSIS, ROUTINE W REFLEX MICROSCOPIC (NOT AT [facility])    EKG  EKG Interpretation None       Radiology Dg Hip Unilat With Pelvis 2-3 Views Left  Result Date: 09/03/2016 CLINICAL DATA:  Hip pain.  Injury. EXAM: DG HIP (WITH OR WITHOUT PELVIS) 2-3V LEFT COMPARISON:  None. FINDINGS: Degenerative changes lumbar spine and both hips. No acute bony abnormality. No evidence of fracture or dislocation. Aortoiliac atherosclerotic vascular calcification. Stable calcific densities noted over the left abdomen. Surgical clips in the pelvis. IMPRESSION: 1. No acute bony or joint abnormality. Degenerative changes lumbar spine  and  both hips. No evidence of fracture. 2. Aortoiliac atherosclerotic vascular disease . Electronically Signed   By: Marcello Moores  Register   On: 09/03/2016 16:38    Procedures Procedures (including critical care time)  Medications Ordered in ED Medications - No data to display   Initial Impression / Assessment and Plan / ED Course  I have reviewed the triage vital signs and the nursing notes.  Pertinent labs & imaging results that were available during my care of the patient were reviewed by me and considered in my medical decision making (see chart for details).  Clinical Course     Patient presents with family who are concerned for bilateral foot/ankle pain. There is left greater than right swelling without ulceration, wound or calf pain.  He is found to have an elevated WBC which is unexplained with current work up. No cough, CXR without PNA. Question cellulitic changes of left foot but erythema is mild and the foot is not significantly tender. He has not been ambulatory since earlier in the week, using wheel chair to get around. He also has an elevated creatinine above baseline.   Discussed with Dr. Leonette Monarch. Patient will be admitted for further evaluation and management.     Final Clinical Impressions(s) / ED Diagnoses   Final diagnoses:  None   1. Leukocytosis 2. AKI 3. Bilateral foot pain 4. Abnormal EKG  New Prescriptions New Prescriptions   No medications on file    I personally performed the services described in this documentation, which was scribed in my presence. The recorded information has been reviewed and is accurate.      Charlann Lange, PA-C 09/15/16 [place], MD 09/16/16 [phone]

## 2016-09-03 NOTE — ED Triage Notes (Signed)
Pt complaint of bilateral ankle swelling worsening over past week; pt on diuretic. Pt continues to reports left hip pain post fall on Wednesday; slid out of bed related to unsteady gait since swelling in ankles.

## 2016-09-04 ENCOUNTER — Encounter (HOSPITAL_COMMUNITY): Payer: Self-pay | Admitting: Family Medicine

## 2016-09-04 ENCOUNTER — Inpatient Hospital Stay (HOSPITAL_COMMUNITY): Payer: Medicare Other

## 2016-09-04 DIAGNOSIS — N189 Chronic kidney disease, unspecified: Secondary | ICD-10-CM

## 2016-09-04 DIAGNOSIS — I5042 Chronic combined systolic (congestive) and diastolic (congestive) heart failure: Secondary | ICD-10-CM | POA: Diagnosis present

## 2016-09-04 DIAGNOSIS — I5023 Acute on chronic systolic (congestive) heart failure: Secondary | ICD-10-CM | POA: Diagnosis present

## 2016-09-04 DIAGNOSIS — N179 Acute kidney failure, unspecified: Secondary | ICD-10-CM | POA: Diagnosis present

## 2016-09-04 DIAGNOSIS — L0390 Cellulitis, unspecified: Secondary | ICD-10-CM

## 2016-09-04 DIAGNOSIS — D696 Thrombocytopenia, unspecified: Secondary | ICD-10-CM | POA: Diagnosis present

## 2016-09-04 DIAGNOSIS — I480 Paroxysmal atrial fibrillation: Secondary | ICD-10-CM

## 2016-09-04 DIAGNOSIS — I4891 Unspecified atrial fibrillation: Secondary | ICD-10-CM

## 2016-09-04 DIAGNOSIS — I509 Heart failure, unspecified: Secondary | ICD-10-CM

## 2016-09-04 LAB — APTT: aPTT: 47 seconds — ABNORMAL HIGH (ref 24–36)

## 2016-09-04 LAB — ECHOCARDIOGRAM COMPLETE
HEIGHTINCHES: 74 in
WEIGHTICAEL: 3232 [oz_av]

## 2016-09-04 LAB — BASIC METABOLIC PANEL
ANION GAP: 11 (ref 5–15)
Anion gap: 9 (ref 5–15)
BUN: 39 mg/dL — ABNORMAL HIGH (ref 6–20)
BUN: 37 mg/dL — AB (ref 6–20)
CALCIUM: 9 mg/dL (ref 8.9–10.3)
CALCIUM: 8.9 mg/dL (ref 8.9–10.3)
CHLORIDE: 102 mmol/L (ref 101–111)
CO2: 22 mmol/L (ref 22–32)
CO2: 26 mmol/L (ref 22–32)
CREATININE: 2.37 mg/dL — AB (ref 0.61–1.24)
CREATININE: 2.44 mg/dL — AB (ref 0.61–1.24)
Chloride: 107 mmol/L (ref 101–111)
GFR calc non Af Amer: 22 mL/min — ABNORMAL LOW (ref >60)
GFR, EST AFRICAN AMERICAN: 26 mL/min — AB (ref >60)
GFR, EST AFRICAN AMERICAN: 25 mL/min — AB (ref >60)
GFR, EST NON AFRICAN AMERICAN: 23 mL/min — AB (ref >60)
Glucose, Bld: 170 mg/dL — ABNORMAL HIGH (ref 65–99)
Glucose, Bld: 112 mg/dL — ABNORMAL HIGH (ref 65–99)
Potassium: 3.8 mmol/L (ref 3.5–5.1)
Potassium: 3.9 mmol/L (ref 3.5–5.1)
SODIUM: 140 mmol/L (ref 135–145)
SODIUM: 137 mmol/L (ref 135–145)

## 2016-09-04 LAB — GLUCOSE, CAPILLARY
GLUCOSE-CAPILLARY: 163 mg/dL — AB (ref 65–99)
GLUCOSE-CAPILLARY: 140 mg/dL — AB (ref 65–99)
GLUCOSE-CAPILLARY: 81 mg/dL (ref 65–99)
Glucose-Capillary: 96 mg/dL (ref 65–99)
Glucose-Capillary: 147 mg/dL — ABNORMAL HIGH (ref 65–99)

## 2016-09-04 LAB — TROPONIN I
TROPONIN I: 0.62 ng/mL — AB (ref <0.03)
Troponin I: 0.47 ng/mL (ref <0.03)
Troponin I: 0.54 ng/mL (ref <0.03)

## 2016-09-04 LAB — CBC
HEMATOCRIT: 30.2 % — AB (ref 39.0–52.0)
HEMOGLOBIN: 10.1 g/dL — AB (ref 13.0–17.0)
MCH: 30.1 pg (ref 26.0–34.0)
MCHC: 33.4 g/dL (ref 30.0–36.0)
MCV: 89.9 fL (ref 78.0–100.0)
Platelets: 88 10*3/uL — ABNORMAL LOW (ref 150–400)
RBC: 3.36 MIL/uL — ABNORMAL LOW (ref 4.22–5.81)
RDW: 18.2 % — AB (ref 11.5–15.5)
WBC: 20.8 10*3/uL — ABNORMAL HIGH (ref 4.0–10.5)

## 2016-09-04 LAB — URIC ACID: URIC ACID, SERUM: 13.4 mg/dL — AB (ref 4.4–7.6)

## 2016-09-04 LAB — PROTIME-INR
INR: 1.31
Prothrombin Time: 16.4 seconds — ABNORMAL HIGH (ref 11.4–15.2)

## 2016-09-04 LAB — PROCALCITONIN: PROCALCITONIN: 0.25 ng/mL

## 2016-09-04 LAB — HEPARIN LEVEL (UNFRACTIONATED): HEPARIN UNFRACTIONATED: 0.75 [IU]/mL — AB (ref 0.30–0.70)

## 2016-09-04 LAB — MAGNESIUM: Magnesium: 1.8 mg/dL (ref 1.7–2.4)

## 2016-09-04 LAB — TSH: TSH: 1.804 u[IU]/mL (ref 0.350–4.500)

## 2016-09-04 MED ORDER — ACETAMINOPHEN 650 MG RE SUPP: 650.0000 mg | Freq: Four times a day (QID) | RECTAL | Status: DC | PRN

## 2016-09-04 MED ORDER — ACETAMINOPHEN 325 MG PO TABS
650.0000 mg | ORAL_TABLET | Freq: Four times a day (QID) | ORAL | Status: DC | PRN
  Administered 2016-09-07: 650 mg via ORAL
  Filled 2016-09-04: qty 2

## 2016-09-04 MED ORDER — NITROGLYCERIN 0.6 MG/HR TD PT24
0.6000 mg | MEDICATED_PATCH | Freq: Every day | TRANSDERMAL | Status: DC
  Administered 2016-09-04 – 2016-09-13 (×10): 0.6 mg via TRANSDERMAL
  Filled 2016-09-04 (×10): qty 1

## 2016-09-04 MED ORDER — CEFAZOLIN IN D5W 1 GM/50ML IV SOLN
1.0000 g | Freq: Two times a day (BID) | INTRAVENOUS | Status: DC
Start: 2016-09-05 — End: 2016-09-07
  Administered 2016-09-05 – 2016-09-07 (×5): 1 g via INTRAVENOUS
  Filled 2016-09-04 (×7): qty 50

## 2016-09-04 MED ORDER — INSULIN ASPART 100 UNIT/ML ~~LOC~~ SOLN: 0.0000 [IU] | Freq: Every day | SUBCUTANEOUS | Status: DC

## 2016-09-04 MED ORDER — TAMSULOSIN HCL 0.4 MG PO CAPS
0.4000 mg | ORAL_CAPSULE | Freq: Every day | ORAL | Status: DC
  Administered 2016-09-04 – 2016-09-13 (×10): 0.4 mg via ORAL
  Filled 2016-09-04 (×10): qty 1

## 2016-09-04 MED ORDER — PRAVASTATIN SODIUM 20 MG PO TABS
20.0000 mg | ORAL_TABLET | Freq: Every day | ORAL | Status: DC
  Administered 2016-09-04 – 2016-09-12 (×10): 20 mg via ORAL
  Filled 2016-09-04 (×10): qty 1

## 2016-09-04 MED ORDER — CARVEDILOL 6.25 MG PO TABS
6.2500 mg | ORAL_TABLET | Freq: Two times a day (BID) | ORAL | Status: DC
  Administered 2016-09-04 – 2016-09-13 (×20): 6.25 mg via ORAL
  Filled 2016-09-04 (×20): qty 1

## 2016-09-04 MED ORDER — CEFAZOLIN IN D5W 1 GM/50ML IV SOLN
1.0000 g | Freq: Three times a day (TID) | INTRAVENOUS | Status: DC
  Administered 2016-09-04 (×3): 1 g via INTRAVENOUS
  Filled 2016-09-04 (×4): qty 50

## 2016-09-04 MED ORDER — TIOTROPIUM BROMIDE MONOHYDRATE 18 MCG IN CAPS
18.0000 ug | ORAL_CAPSULE | Freq: Every day | RESPIRATORY_TRACT | Status: DC
  Administered 2016-09-05: 18 ug via RESPIRATORY_TRACT
  Filled 2016-09-04: qty 5

## 2016-09-04 MED ORDER — IPRATROPIUM-ALBUTEROL 18-103 MCG/ACT IN AERO: 2.0000 | INHALATION_SPRAY | Freq: Two times a day (BID) | RESPIRATORY_TRACT | Status: DC | PRN

## 2016-09-04 MED ORDER — ONDANSETRON HCL 4 MG PO TABS: 4.0000 mg | ORAL_TABLET | Freq: Four times a day (QID) | ORAL | Status: DC | PRN

## 2016-09-04 MED ORDER — PANTOPRAZOLE SODIUM 40 MG PO TBEC
40.0000 mg | DELAYED_RELEASE_TABLET | Freq: Every day | ORAL | Status: DC
  Administered 2016-09-04 – 2016-09-13 (×10): 40 mg via ORAL
  Filled 2016-09-04 (×10): qty 1

## 2016-09-04 MED ORDER — PERFLUTREN LIPID MICROSPHERE
1.0000 mL | INTRAVENOUS | Status: AC | PRN
  Administered 2016-09-04: 2 mL via INTRAVENOUS
  Filled 2016-09-04: qty 10

## 2016-09-04 MED ORDER — TRAMADOL HCL 50 MG PO TABS
50.0000 mg | ORAL_TABLET | Freq: Four times a day (QID) | ORAL | Status: DC | PRN
  Administered 2016-09-11 – 2016-09-13 (×3): 50 mg via ORAL
  Filled 2016-09-04 (×3): qty 1

## 2016-09-04 MED ORDER — ONDANSETRON HCL 4 MG/2ML IJ SOLN: 4.0000 mg | Freq: Four times a day (QID) | INTRAMUSCULAR | Status: DC | PRN

## 2016-09-04 MED ORDER — CARVEDILOL 6.25 MG PO TABS: 6.2500 mg | ORAL_TABLET | Freq: Two times a day (BID) | ORAL | Status: DC

## 2016-09-04 MED ORDER — INSULIN ASPART 100 UNIT/ML ~~LOC~~ SOLN
0.0000 [IU] | Freq: Three times a day (TID) | SUBCUTANEOUS | Status: DC
  Administered 2016-09-04 (×2): 1 [IU] via SUBCUTANEOUS
  Administered 2016-09-04: 2 [IU] via SUBCUTANEOUS
  Administered 2016-09-05: 1 [IU] via SUBCUTANEOUS
  Administered 2016-09-06: 3 [IU] via SUBCUTANEOUS
  Administered 2016-09-06: 1 [IU] via SUBCUTANEOUS
  Administered 2016-09-07: 2 [IU] via SUBCUTANEOUS
  Administered 2016-09-07: 1 [IU] via SUBCUTANEOUS
  Administered 2016-09-08: 2 [IU] via SUBCUTANEOUS
  Administered 2016-09-08: 1 [IU] via SUBCUTANEOUS
  Administered 2016-09-09: 2 [IU] via SUBCUTANEOUS
  Administered 2016-09-10: 1 [IU] via SUBCUTANEOUS
  Administered 2016-09-11 – 2016-09-12 (×4): 2 [IU] via SUBCUTANEOUS
  Administered 2016-09-13: 1 [IU] via SUBCUTANEOUS

## 2016-09-04 MED ORDER — HEPARIN BOLUS VIA INFUSION
2500.0000 [IU] | Freq: Once | INTRAVENOUS | Status: AC
  Administered 2016-09-04: 2500 [IU] via INTRAVENOUS
  Filled 2016-09-04: qty 2500

## 2016-09-04 MED ORDER — MONTELUKAST SODIUM 10 MG PO TABS
10.0000 mg | ORAL_TABLET | Freq: Every day | ORAL | Status: DC
  Administered 2016-09-04 – 2016-09-12 (×10): 10 mg via ORAL
  Filled 2016-09-04 (×10): qty 1

## 2016-09-04 MED ORDER — ENOXAPARIN SODIUM 30 MG/0.3ML ~~LOC~~ SOLN: 30.0000 mg | Freq: Every day | SUBCUTANEOUS | Status: DC

## 2016-09-04 MED ORDER — FUROSEMIDE 10 MG/ML IJ SOLN
40.0000 mg | Freq: Two times a day (BID) | INTRAMUSCULAR | Status: DC
  Administered 2016-09-04 – 2016-09-07 (×7): 40 mg via INTRAVENOUS
  Filled 2016-09-04 (×7): qty 4

## 2016-09-04 MED ORDER — POTASSIUM CHLORIDE CRYS ER 20 MEQ PO TBCR
40.0000 meq | EXTENDED_RELEASE_TABLET | Freq: Two times a day (BID) | ORAL | Status: DC
  Administered 2016-09-04 – 2016-09-07 (×8): 40 meq via ORAL
  Filled 2016-09-04 (×8): qty 2

## 2016-09-04 MED ORDER — LEVOTHYROXINE SODIUM 25 MCG PO TABS
125.0000 ug | ORAL_TABLET | Freq: Every day | ORAL | Status: DC
  Administered 2016-09-04 – 2016-09-13 (×10): 125 ug via ORAL
  Filled 2016-09-04 (×10): qty 1

## 2016-09-04 MED ORDER — HEPARIN (PORCINE) IN NACL 100-0.45 UNIT/ML-% IJ SOLN
1400.0000 [IU]/h | INTRAMUSCULAR | Status: DC
  Administered 2016-09-04: 1400 [IU]/h via INTRAVENOUS
  Filled 2016-09-04: qty 250

## 2016-09-04 MED ORDER — IPRATROPIUM-ALBUTEROL 0.5-2.5 (3) MG/3ML IN SOLN: 3.0000 mL | Freq: Four times a day (QID) | RESPIRATORY_TRACT | Status: DC | PRN

## 2016-09-04 NOTE — Consult Note (Addendum)
Reason for Consult: atrial fibrillation   Requesting Physician: Charlies Silvers  Cardiologist: Alethia Berthold ([facility])  HPI: This is a 82 y.o. male with a past medical history significant for CAD, CHF with mildly depressed left ventricular systolic function, hypertension, hyperlipidemia, frequent severe epistaxis precluding antiplatelet/anticoagulation therapy, chronic kidney disease, presenting with bilateral ankle swelling and pain that prevents walking.   Incidentally noted to have atrial fibrillation with controlled ventricular rate, now back in sinus rhythm. Also has worsening of baseline dyspnea, now NYHA functional class II-III, although the ankle pain limits her activity more than breathing. Echocardiogram that was just performed confirms elevated mean left atrial pressure, but shows no change in chronically depressed left ventricular ejection fraction of 45% due to scar of previous basal inferior/posterior myocardial infarction. Similar to findings on echo from every 2017 at [facility]. BNP is also elevated above baseline. Unfortunately, renal function parameters are also substantially worse than baseline creatinine typically 1.8, now 2.5.  Radiology studies suggest possible avulsion fracture of right medial malleolus, which is corroborated by patient's reports of a large ecchymosis in this area about a week ago. However, he also has exquisite tenderness to touch over the right metatarsophalangeal joint, erythema and an elevated white blood cell count. He has been started on antibiotics for possible cellulitis. He has never had gout in the past.    PMHx:  Past Medical History:  Diagnosis Date  . Abdominal mass 2008   chronic inflammatory mass of the mesentary Dr [name]  . CAD (coronary artery disease)    cath 09/23/09 70% Dx1 (diffuse disease), 40% Cx, Large RCA 80-90% with heavy calcification / cath 06/2010 no change tiht RCA still best to treat medially with nosebleeds  . Chest pain    myoview 2006 no ischemia/ myoview 2009 no ischemia  . Dyslipidemia   . Hypertension    no RAS  . Hypothyroidism   . Kidney mass 2008   radiofrequency ablation at [facility]  . Left ventricular dysfunction    myoview 2006 normal / myoview 2009 normal / EF 60% cath 09/2009, EF 60% cath 06/2010  . Memory impairment    memory decreasing 06/2010  . Nosebleed    significant, can not take ASA  . Past heart attack    X2  . Rash Sept 2011   rash on buttocks, hospitalized hydralazine stopped but then restarted w/o return of rash  . Systolic click    no mitral valve prolapse   Past Surgical History:  Procedure Laterality Date  . BACK SURGERY    . BONE MARROW ASPIRATION     X2  . BONE MARROW BIOPSY     X2  . COLONOSCOPY N/A 03/07/2013   Procedure: COLONOSCOPY;  Surgeon: [name], MD;  Location: [facility];  Service: Endoscopy;  Laterality: N/A;  830-moved to [address] Ann to notify pt  . COLONOSCOPY  01/2013  . lymph node removal     right abdomen  . LYMPH NODE removal     robotic laser @ [facility]  on left side of stomach  . RFA RIGHT KIDNEY      FAMHx: Family History  Problem Relation Age of Onset  . Cancer Maternal Grandfather   . Coronary artery disease      SOCHx:  reports that he quit smoking about 35 years ago. His smoking use included Cigarettes. He has a 21.00 pack-year smoking history. He has never used smokeless tobacco. He reports that he does not drink alcohol. His drug history  is not on file.  ALLERGIES: Allergies  Allergen Reactions  . Ranolazine Shortness Of Breath  . Simvastatin Other (See Comments)    fatigue  . Aspirin Other (See Comments)    Causes severe bleeding  . Contrast Media [Iodinated Diagnostic Agents] Other (See Comments)    Will shut kidneys down  . Ibuprofen Other (See Comments)    Nose bleeds  . Ioxaglate Other (See Comments)    Will shut kidneys down    ROS: Pertinent items are noted in HPI.  HOME MEDICATIONS: Prescriptions Prior to  Admission  Medication Sig Dispense Refill Last Dose  . acetaminophen (TYLENOL) 500 MG tablet Take 500 mg by mouth every 6 (six) hours as needed for moderate pain.   09/02/2016 at Unknown time  . albuterol-ipratropium (COMBIVENT) 18-103 MCG/ACT inhaler Inhale 2-4 puffs into the lungs 2 (two) times daily as needed for wheezing.    09/02/2016 at Unknown time  . carvedilol (COREG) 6.25 MG tablet Take 6.25 mg by mouth 2 (two) times daily with a meal.     09/02/2016 at 2200  . Cholecalciferol (VITAMIN D3) 5000 UNITS CAPS Take 5,000 Units by mouth daily.    09/02/2016 at Unknown time  . fluticasone (FLONASE) 50 MCG/ACT nasal spray Place 2 sprays into both nostrils at bedtime as needed for allergies.    Past Week at Unknown time  . furosemide (LASIX) 40 MG tablet Take 20 mg by mouth daily.    09/02/2016 at Unknown time  . ipratropium (ATROVENT) 0.03 % nasal spray Place 2 sprays into both nostrils 3 (three) times daily.    08/29/2016  . levothyroxine (SYNTHROID, LEVOTHROID) 125 MCG tablet Take 125 mcg by mouth daily.    09/02/2016 at Unknown time  . montelukast (SINGULAIR) 10 MG tablet Take 10 mg by mouth at bedtime.    09/02/2016 at Unknown time  . multivitamin-iron-minerals-folic acid (THERAPEUTIC-M) TABS tablet Take 1 tablet by mouth daily.   09/02/2016 at Unknown time  . nitroGLYCERIN (NITRODUR - DOSED IN MG/24 HR) 0.6 mg/hr patch Place 0.6 mg onto the skin daily.   09/03/2016 at Unknown time  . nitroGLYCERIN (NITROSTAT) 0.4 MG SL tablet Place 1 tablet (0.4 mg total) under the tongue as needed. (Patient taking differently: Place 0.4 mg under the tongue as needed for chest pain. ) 25 tablet 6 unknown  . Omega-3 Fatty Acids (FISH OIL) 1000 MG CPDR Take 1,000 mg by mouth daily.   09/02/2016 at Unknown time  . pantoprazole (PROTONIX) 40 MG tablet Take 1 tablet (40 mg total) by mouth daily before breakfast. 90 tablet 3 09/02/2016 at Unknown time  . pravastatin (PRAVACHOL) 20 MG tablet Take 20 mg by mouth at  bedtime.   09/02/2016 at Unknown time  . tamsulosin (FLOMAX) 0.4 MG CAPS capsule Take 0.4 mg by mouth daily.   09/02/2016 at Unknown time  . tiotropium (SPIRIVA) 18 MCG inhalation capsule Place 18 mcg into inhaler and inhale daily.   09/02/2016 at Unknown time  . vitamin B-12 (CYANOCOBALAMIN) 1000 MCG tablet Take 1,000 mcg by mouth daily.     09/02/2016 at Unknown time    HOSPITAL MEDICATIONS: Scheduled: . carvedilol  6.25 mg Oral BID WC  .  ceFAZolin (ANCEF) IV  1 g Intravenous Q8H  . furosemide  40 mg Intravenous BID  . insulin aspart  0-5 Units Subcutaneous QHS  . insulin aspart  0-9 Units Subcutaneous TID WC  . levothyroxine  125 mcg Oral QAC breakfast  . montelukast  10 mg Oral  QHS  . nitroGLYCERIN  0.6 mg Transdermal Daily  . pantoprazole  40 mg Oral QAC breakfast  . potassium chloride  40 mEq Oral BID  . pravastatin  20 mg Oral QHS  . tamsulosin  0.4 mg Oral Daily  . tiotropium  18 mcg Inhalation Daily   Continuous: . heparin 1,400 Units/hr (09/04/16 0202)    VITALS: Blood pressure 121/61, pulse 72, temperature 99 F (37.2 C), temperature source Axillary, resp. rate 16, height 6' 2"  (1.88 m), weight 202 lb (91.6 kg), SpO2 96 %.  PHYSICAL EXAM:  General: Alert, oriented x3, no distress Head: no evidence of trauma, PERRL, EOMI, no exophtalmos or lid lag, no myxedema, no xanthelasma; normal ears, nose and oropharynx Neck: 5-6 cm elevation in jugular venous pulsations and no hepatojugular reflux; brisk carotid pulses without delay and no carotid bruits Chest: clear to auscultation, no signs of consolidation by percussion or palpation, normal fremitus, symmetrical and full respiratory excursions Cardiovascular: normal position and quality of the apical impulse, regular rhythm, normal first heart sound and normal second heart sound, no rubs or gallops, no murmur Abdomen: no tenderness or distention, no masses by palpation, no abnormal pulsatility or arterial bruits, normal bowel  sounds, no hepatosplenomegaly Extremities: no clubbing, cyanosis;  1+ symmetrical ankle edema; 2+ radial, ulnar and brachial pulses bilaterally; 2+ right femoral, posterior tibial and dorsalis pedis pulses; 2+ left femoral, posterior tibial and dorsalis pedis pulses; no subclavian or femoral bruits Neurological: grossly nonfocal   LABS  CBC  Recent Labs  09/03/16 1605 09/04/16 0535  WBC 27.1* 20.8*  NEUTROABS 1.9  --   HGB 12.0* 10.1*  HCT 35.2* 30.2*  MCV 88.0 89.9  PLT 101* 88*   Basic Metabolic Panel  Recent Labs  09/03/16 1605 09/03/16 2314  NA 144  --   K 3.6  --   CL 108  --   CO2 26  --   GLUCOSE 117*  --   BUN 41*  --   CREATININE 2.53*  --   CALCIUM 9.5  --   MG  --  1.8   Liver Function Tests No results for input(s): AST, ALT, ALKPHOS, BILITOT, PROT, ALBUMIN in the last 72 hours. No results for input(s): LIPASE, AMYLASE in the last 72 hours. Cardiac Enzymes  Recent Labs  09/03/16 2314 09/04/16 0535  TROPONINI 0.79* 0.47*   Thyroid Function Tests  Recent Labs  09/03/16 1602  TSH 1.804    IMAGING: Dg Chest 2 View  Result Date: 09/03/2016 CLINICAL DATA:  Bilateral ankle swelling worse over the past week. History of coronary artery disease and hypertension. Former smoker. Cough and fever. EXAM: CHEST  2 VIEW COMPARISON:  11/28/2015 FINDINGS: Shallow inspiration. Mild cardiac enlargement with pulmonary vascular congestion. Slight interstitial pattern to the lung bases may represent early interstitial edema. No focal consolidation. No blunting of costophrenic angles. No pneumothorax. Calcified and tortuous aorta. Degenerative changes in the spine. IMPRESSION: Mild congestive changes in the heart and lungs with mild interstitial edema. No effusion or consolidation. Electronically Signed   By: Lucienne Capers M.D.   On: 09/03/2016 21:43   Dg Ankle Complete Left  Result Date: 09/03/2016 CLINICAL DATA:  Ankle swelling EXAM: LEFT ANKLE COMPLETE - 3+  VIEW COMPARISON:  08/03/2016 FINDINGS: Avulsion injury medial malleolus as before, probably chronic. Slight lucency in the lateral talar dome. Probable old avulsion injury fibular malleolus. Tiny os ossific densities project over medial joint space, cannot exclude tiny loose bodies. Soft tissue swelling, moderate. Mild  cortical irregularity of the anterior inferior tibia, partially related to degenerative changes. IMPRESSION: 1. Moderate soft tissue swelling. No definite acute osseous abnormality. 2. Probable old avulsion injuries of the medial and lateral malleolus. 3. Cortical irregularity of the anterior inferior tibia, likely on the basis of degenerative changes. 4. Questionable OCD lesion lateral tailor dome. Electronically Signed   By: Donavan Foil M.D.   On: 09/03/2016 21:28   Dg Ankle Complete Right  Result Date: 09/03/2016 CLINICAL DATA:  Bilateral ankle swelling, worse over the past week. Patient is on a diuretic. Fall. EXAM: RIGHT ANKLE - COMPLETE 3+ VIEW COMPARISON:  Right foot 04/10/2005 FINDINGS: Focal linear lucency and cortical irregularity at the medial malleolus may represent avulsion fracture. Medial soft tissue swelling about the right ankle. No acute displaced fractures are identified. Degenerative changes in the right ankle and intertarsal joints. No focal bone lesion or bone destruction. Vascular calcifications in the soft tissues. IMPRESSION: Focal cortical changes of the medial malleolus may indicate an avulsion fracture. Soft tissue swelling. Degenerative changes. Electronically Signed   By: Lucienne Capers M.D.   On: 09/03/2016 21:26   Dg Foot Complete Left  Result Date: 09/03/2016 CLINICAL DATA:  Bilateral ankle swelling worse over the past week. Fall. EXAM: LEFT FOOT - COMPLETE 3+ VIEW COMPARISON:  None. FINDINGS: Soft tissue swelling over the left forefoot. Mild degenerative changes in the intertarsal and first metatarsal-phalangeal joints. No acute fracture or dislocation.  No focal bone lesion or bone destruction. IMPRESSION: Mild soft tissue swelling. Degenerative changes. No acute bony abnormalities. Electronically Signed   By: Lucienne Capers M.D.   On: 09/03/2016 21:29   Dg Foot Complete Right  Result Date: 09/03/2016 CLINICAL DATA:  Bilateral ankle swelling, worse over the past week. Fall. EXAM: RIGHT FOOT COMPLETE - 3+ VIEW COMPARISON:  Right foot 04/10/2005 FINDINGS: Soft tissue swelling over the dorsum of the right foot. Mild degenerative changes in the intertarsal joints. No evidence of acute fracture or dislocation. Vague focal lucency in the calcaneus is unchanged since prior study and probably represents a small bone cyst or lipoma. No destructive or expansile bone lesions. IMPRESSION: Dorsal soft tissue swelling over the right foot. Mild degenerative changes. No acute bony abnormalities. Electronically Signed   By: Lucienne Capers M.D.   On: 09/03/2016 21:32   Dg Hip Unilat With Pelvis 2-3 Views Left  Result Date: 09/03/2016 CLINICAL DATA:  Hip pain.  Injury. EXAM: DG HIP (WITH OR WITHOUT PELVIS) 2-3V LEFT COMPARISON:  None. FINDINGS: Degenerative changes lumbar spine and both hips. No acute bony abnormality. No evidence of fracture or dislocation. Aortoiliac atherosclerotic vascular calcification. Stable calcific densities noted over the left abdomen. Surgical clips in the pelvis. IMPRESSION: 1. No acute bony or joint abnormality. Degenerative changes lumbar spine and both hips. No evidence of fracture. 2. Aortoiliac atherosclerotic vascular disease . Electronically Signed   By: Marcello Moores  Register   On: 09/03/2016 16:38    ECG: Afib with controlled rate, old inferior MI, lateral T wave changes, slightly increased from baseline  TELEMETRY: Now in NSR  Cardiac cath December 2012 left main 30% distal, LAD 30% ostial, 40% mid, 50% distal, 70% first diagonal; circumflex long 40% proximal portion to marginal branch; RCA 90% in the mid portion, 70% ostial in  the PD and 40% in the right coronary artery after the PD. EF 60%.   IMPRESSION: 1. Afib: Newly recognized. He was unaware of palpitations. He did have worsening heart failure which might have been triggered by  atrial fibrillation (or vice versa versus). High likelihood that this arrhythmia has previously occurred asymptomatically and high likelihood that he will have it again in the future. Unfortunately he reports that he is unable to take even aspirin due to frequent severe epistaxis which has led to emergency room visits. CHADSVasc 5 (age 28, CAD, HF, HTN), although without previous history of CVA/TIA. He may be a candidate for a watchman device if he can take warfarin for 1 month. At this point, she declines initiation of anticoagulants. I think he should have this discussion with his primary cardiologist, Dr. Alethia Berthold. 2. CHF: Acute on chronic/combined systolic and diastolic, possible exacerbation due to atrial fibrillation. Has been given extra diuretics. Monitor renal function 3. Abnormal troponin: I do not think this is a sign of a true acute coronary syndrome. He does not have angina pectoris. His ECG shows chronic repolarization abnormalities. Not a great candidate for invasive evaluation due to renal insufficiency. Ideally he should be on long-term aspirin but this has been avoided for the same reasons described above. 4. CAD: ECG and echo evidence of previous inferior wall myocardial infarction, last functional study was July 2012 with a scar in the basal inferior wall and mild inferolateral basal ischemia. 5. HTN: Controlled 6. Acute on chronic renal failure: Might be related to heart failure exacerbation. Will monitor response to diuretics 7. Possible gout right first metatarsophalangeal joint. Check uric acid. Consider colchicine versus prednisone. Avoid NSAIDs. 8. Mild thrombocytopenia: Not sure if this has any relationship to his tendency for nosebleeds. Also not sure of etiology. In and of  itself should not preclude use of antiplatelet/anticoagulants. 9. COPD 10. Right kidney mass followed at [facility]. [facility] - Dr. [name]  RECOMMENDATION: 1. DC heparin 2. Diuretics. 3. Daily monitoring of renal function; check uric acid 4. Discuss long-term stroke prevention strategy with Dr. Alethia Berthold, consider watchman device if he can take a month of warfarin anticoagulation 5. Continue current dose of beta blockers which provides good rate control  Time Spent Directly with Patient: 60 minutes  Sanda Klein, MD, [facility] [phone] office [phone] pager   09/04/2016, 10:18 AM

## 2016-09-04 NOTE — Progress Notes (Signed)
  Echocardiogram 2D Echocardiogram with Definity has been performed.  Darlina Sicilian M 09/04/2016, 9:14 AM

## 2016-09-04 NOTE — Progress Notes (Signed)
Patient ID: Brian Mcmillan, male   DOB: 02-26-1925, 82 y.o.   MRN: [id number]  PROGRESS NOTE    Brian Mcmillan  [id number] DOB: 07-Dec-1924 DOA: 09/03/2016  PCP: [name], MD   Brief Narrative:  82 y.o. male with a past medical history significant for ischemic heart disease and CHF EF 45%, HTN, hypothyroidism, renal CA s/p ablation, hx of TIA, MGUS and NIDDM who presented to [facility] ED with bilateral ankle pain, swelling for past 203 weeks prior to this admission. Pt also reproted ongoing cough intermittently productive of clear sputum, fevers.  In ED, pt afebrile, HR in 90's, irregular. Blood work showed Cr 2.53 (baseline 1.4-1.7), WBC count 27.1, platelets 101, lactic acid 1.5. The 12 lead EKG showed new Afib. BNP was 670 and troponin 0.70. Pt seen by cardio in consultation.   Assessment & Plan:   Principal Problem:   Acute on chronic systolic CHF (congestive heart failure) (HCC) / Acute respiratory failure with hypoxia - Appreciate cardio seeing the pt in consultation - CXR with mild congestive changes - Follow up 2 D ECHO results - BNP on admission 670 - Continue lasix 40 mg IV BID - Strict intake and outpt - Daily weight: on admission 91 kg - Replete electrolytes as needed  Active Problems:   New onset atrial fibrillation  - CHADS vasc score 5 - Appreciate cardio input - Pt reported he cannot take aspirin due to epistaxis - Per cardio, may be a candidate for a watchman device if he can take warfarin for 1 month. At this point, he declines initiation of anticoagulants    Elevate troponin level - Demand ischemia from acute CHF, a fib  - EKG with chronic repolarization abnormalities - Not a candidate for invasive evaluation per cardio    Right foot cellulitis / Leukocytosis - Continue cefazolin - Soft tissue swelling on x rays     CKD stage 3 - Cr at baseline 1.77 - Cr on this admission 2.53, likely due to lasix - Monitor Cr while on IV lasix for tx of acute  decompensated CHF    Thrombocytopenia - Due to history of MGUS - Monitor platelet count    Anemia of chronic disease - Due to CKD and MGUS - Hgb stable     DVT prophylaxis: SCD's bilaterally  Code Status: full code  Family Communication: wife at the bedside this am Disposition Plan: home or SNF once cleared by cardio    Consultants:   Cardio  Procedures:   2 D ECHO - pending   Antimicrobials:   Cefazolin 09/03/2016 -->   Subjective: Feels weak and tired.  Objective: Vitals:   09/03/16 2313 09/03/16 2350 09/04/16 0000 09/04/16 0453  BP:  105/76 (!) 188/73 121/61  Pulse:  78 82 72  Resp:  24 18 16   Temp: (S) 98.6 F (37 C)  97.5 F (36.4 C) 99 F (37.2 C)  TempSrc: (S) Rectal  Axillary Axillary  SpO2:  99% 97% 96%  Weight:   91 kg (200 lb 9.6 oz) 91.6 kg (202 lb)  Height:   6\' 2"  (1.88 m) 6\' 2"  (1.88 m)    Intake/Output Summary (Last 24 hours) at 09/04/16 1330 Last data filed at 09/04/16 1000  Gross per 24 hour  Intake           799.53 ml  Output              200 ml  Net  599.53 ml   Filed Weights   09/04/16 0000 09/04/16 0453  Weight: 91 kg (200 lb 9.6 oz) 91.6 kg (202 lb)    Examination:  General exam: Appears calm and comfortable  Respiratory system: Congested, rhonchorous Cardiovascular system: S1 & S2 heard, rate irregular Gastrointestinal system: Abdomen is nondistended, soft and nontender. No organomegaly or masses felt. Normal bowel sounds heard. Central nervous system: Alert and oriented. No focal neurological deficits. Extremities: LE +1 edema, palpable pulses  Skin: warm and dry Psychiatry: Judgement and insight appear normal. Mood & affect appropriate.   Data Reviewed: I have personally reviewed following labs and imaging studies  CBC:  Recent Labs Lab 09/03/16 1605 09/04/16 0535  WBC 27.1* 20.8*  NEUTROABS 1.9  --   HGB 12.0* 10.1*  HCT 35.2* 30.2*  MCV 88.0 89.9  PLT 101* 88*   Basic Metabolic  Panel:  Recent Labs Lab 09/03/16 1605 09/03/16 2314  NA 144  --   K 3.6  --   CL 108  --   CO2 26  --   GLUCOSE 117*  --   BUN 41*  --   CREATININE 2.53*  --   CALCIUM 9.5  --   MG  --  1.8   GFR: Estimated Creatinine Clearance: 23 mL/min (by C-G formula based on SCr of 2.53 mg/dL (H)). Liver Function Tests: No results for input(s): AST, ALT, ALKPHOS, BILITOT, PROT, ALBUMIN in the last 168 hours. No results for input(s): LIPASE, AMYLASE in the last 168 hours. No results for input(s): AMMONIA in the last 168 hours. Coagulation Profile:  Recent Labs Lab 09/04/16 0141  INR 1.31   Cardiac Enzymes:  Recent Labs Lab 09/03/16 2314 09/04/16 0535 09/04/16 1010  TROPONINI 0.79* 0.47* 0.54*   BNP (last 3 results) No results for input(s): PROBNP in the last 8760 hours. HbA1C: No results for input(s): HGBA1C in the last 72 hours. CBG:  Recent Labs Lab 09/04/16 0127 09/04/16 0821 09/04/16 1228  GLUCAP 96 163* 140*   Lipid Profile: No results for input(s): CHOL, HDL, LDLCALC, TRIG, CHOLHDL, LDLDIRECT in the last 72 hours. Thyroid Function Tests:  Recent Labs  09/03/16 1602  TSH 1.804   Anemia Panel: No results for input(s): VITAMINB12, FOLATE, FERRITIN, TIBC, IRON, RETICCTPCT in the last 72 hours. Urine analysis:    Component Value Date/Time   COLORURINE YELLOW 09/03/2016 2030   APPEARANCEUR CLOUDY (A) 09/03/2016 2030   LABSPEC 1.020 09/03/2016 2030   PHURINE 5.0 09/03/2016 2030   GLUCOSEU NEGATIVE 09/03/2016 2030   HGBUR SMALL (A) 09/03/2016 2030   [place] NEGATIVE 09/03/2016 2030   [place] 09/03/2016 2030   PROTEINUR 30 (A) 09/03/2016 2030   UROBILINOGEN 0.2 12/04/2014 2042   NITRITE NEGATIVE 09/03/2016 2030   LEUKOCYTESUR NEGATIVE 09/03/2016 2030   Sepsis Labs: @LABRCNTIP (procalcitonin:4,lacticidven:4)   )No results found for this or any previous visit (from the past 240 hour(s)).    Radiology Studies: Dg Chest 2 View  Result  Date: 09/03/2016 CLINICAL DATA:  Bilateral ankle swelling worse over the past week. History of coronary artery disease and hypertension. Former smoker. Cough and fever. EXAM: CHEST  2 VIEW COMPARISON:  11/28/2015 FINDINGS: Shallow inspiration. Mild cardiac enlargement with pulmonary vascular congestion. Slight interstitial pattern to the lung bases may represent early interstitial edema. No focal consolidation. No blunting of costophrenic angles. No pneumothorax. Calcified and tortuous aorta. Degenerative changes in the spine. IMPRESSION: Mild congestive changes in the heart and lungs with mild interstitial edema. No effusion or consolidation.  Electronically Signed   By: Lucienne Capers M.D.   On: 09/03/2016 21:43   Dg Ankle Complete Left  Result Date: 09/03/2016 CLINICAL DATA:  Ankle swelling EXAM: LEFT ANKLE COMPLETE - 3+ VIEW COMPARISON:  08/03/2016 FINDINGS: Avulsion injury medial malleolus as before, probably chronic. Slight lucency in the lateral talar dome. Probable old avulsion injury fibular malleolus. Tiny os ossific densities project over medial joint space, cannot exclude tiny loose bodies. Soft tissue swelling, moderate. Mild cortical irregularity of the anterior inferior tibia, partially related to degenerative changes. IMPRESSION: 1. Moderate soft tissue swelling. No definite acute osseous abnormality. 2. Probable old avulsion injuries of the medial and lateral malleolus. 3. Cortical irregularity of the anterior inferior tibia, likely on the basis of degenerative changes. 4. Questionable OCD lesion lateral tailor dome. Electronically Signed   By: Donavan Foil M.D.   On: 09/03/2016 21:28   Dg Ankle Complete Right  Result Date: 09/03/2016 CLINICAL DATA:  Bilateral ankle swelling, worse over the past week. Patient is on a diuretic. Fall. EXAM: RIGHT ANKLE - COMPLETE 3+ VIEW COMPARISON:  Right foot 04/10/2005 FINDINGS: Focal linear lucency and cortical irregularity at the medial malleolus may  represent avulsion fracture. Medial soft tissue swelling about the right ankle. No acute displaced fractures are identified. Degenerative changes in the right ankle and intertarsal joints. No focal bone lesion or bone destruction. Vascular calcifications in the soft tissues. IMPRESSION: Focal cortical changes of the medial malleolus may indicate an avulsion fracture. Soft tissue swelling. Degenerative changes. Electronically Signed   By: Lucienne Capers M.D.   On: 09/03/2016 21:26   Dg Foot Complete Left  Result Date: 09/03/2016 CLINICAL DATA:  Bilateral ankle swelling worse over the past week. Fall. EXAM: LEFT FOOT - COMPLETE 3+ VIEW COMPARISON:  None. FINDINGS: Soft tissue swelling over the left forefoot. Mild degenerative changes in the intertarsal and first metatarsal-phalangeal joints. No acute fracture or dislocation. No focal bone lesion or bone destruction. IMPRESSION: Mild soft tissue swelling. Degenerative changes. No acute bony abnormalities. Electronically Signed   By: Lucienne Capers M.D.   On: 09/03/2016 21:29   Dg Foot Complete Right  Result Date: 09/03/2016 CLINICAL DATA:  Bilateral ankle swelling, worse over the past week. Fall. EXAM: RIGHT FOOT COMPLETE - 3+ VIEW COMPARISON:  Right foot 04/10/2005 FINDINGS: Soft tissue swelling over the dorsum of the right foot. Mild degenerative changes in the intertarsal joints. No evidence of acute fracture or dislocation. Vague focal lucency in the calcaneus is unchanged since prior study and probably represents a small bone cyst or lipoma. No destructive or expansile bone lesions. IMPRESSION: Dorsal soft tissue swelling over the right foot. Mild degenerative changes. No acute bony abnormalities. Electronically Signed   By: Lucienne Capers M.D.   On: 09/03/2016 21:32   Dg Hip Unilat With Pelvis 2-3 Views Left  Result Date: 09/03/2016 CLINICAL DATA:  Hip pain.  Injury. EXAM: DG HIP (WITH OR WITHOUT PELVIS) 2-3V LEFT COMPARISON:  None. FINDINGS:  Degenerative changes lumbar spine and both hips. No acute bony abnormality. No evidence of fracture or dislocation. Aortoiliac atherosclerotic vascular calcification. Stable calcific densities noted over the left abdomen. Surgical clips in the pelvis. IMPRESSION: 1. No acute bony or joint abnormality. Degenerative changes lumbar spine and both hips. No evidence of fracture. 2. Aortoiliac atherosclerotic vascular disease . Electronically Signed   By: Marcello Moores  Register   On: 09/03/2016 16:38        Scheduled Meds: . carvedilol  6.25 mg Oral BID WC  .  ceFAZolin (ANCEF) IV  1 g Intravenous Q8H  . furosemide  40 mg Intravenous BID  . insulin aspart  0-5 Units Subcutaneous QHS  . insulin aspart  0-9 Units Subcutaneous TID WC  . levothyroxine  125 mcg Oral QAC breakfast  . montelukast  10 mg Oral QHS  . nitroGLYCERIN  0.6 mg Transdermal Daily  . pantoprazole  40 mg Oral QAC breakfast  . potassium chloride  40 mEq Oral BID  . pravastatin  20 mg Oral QHS  . tamsulosin  0.4 mg Oral Daily  . tiotropium  18 mcg Inhalation Daily   Continuous Infusions:   LOS: 1 day    Time spent: 25 minutes  Greater than 50% of the time spent on counseling and coordinating the care.   Leisa Lenz, MD [facility] Pager [phone]  If 7PM-7AM, please contact night-coverage [email or web address] Password [email or web address] 09/04/2016, 1:30 PM

## 2016-09-04 NOTE — H&P (Signed)
History and Physical  Patient Name: Brian Mcmillan     OIZ:[id number]    DOB: [birth year]    DOA: 09/03/2016 PCP: [name], MD  Cardiology: Dr. Alethia Berthold, [facility]-Onc: Dr. [name], [place]      Patient coming from: Home  Chief Complaint: Bilateral ankle pain  HPI: KHYRON Mcmillan is a 82 y.o. male with a past medical history significant for ischemic heart disease and CHF EF 45%, HTN, hypothyroidism, renal CA s/p ablation, hx of TIA, MGUS and NIDDM who presents with bilateral ankle pain for three weeks and is found to have elevated creatinine, new onset Afib, elevated troponin.  The patient is an exceedingly poor historian, and conflates several falls he has over the last month with his current foot pain.  However, from what I can gather, the patient has had significant worsening of some chronic lower extremity swelling in both his ankles in the last 7-10 days, to the extent that he has had to be in a wheelchair.  Both of his ankles are now swollen, all the time, "like I don't have enough Lasix", and they are very painful because of the swelling.  At the same time, his wife has noticed some worsening of his breathing "especially when he lies down" and has tried to give him his Combivent without any relief.  Two days ago he saw his podiatrist-cousin who said he "didn't know what that was but it needed to be checked out", so he came to the ER today.  To confuse things somewhat, he reports fevers at home, requiring Tylenol over the last week.  ED course: -Afebrile, heart rate 90s, irregular, respirations and pulse ox normal, BP 162/108 -Na 144, K 3.6, Cr 2.53 (baseline 1.4-1.7), WBC 27.1K, Hgb 12, platelets 101K -UA with hyaline casts, no pyuria, nitrites -Lactic acid 1.5 -ECG showed new AFib with rates 90s -BNP 670 -Troponin 0.79           ROS: Review of Systems  Constitutional: Positive for fever. Negative for chills and malaise/fatigue.  Respiratory: Positive for  cough (chronic unchanged) and shortness of breath.   Cardiovascular: Positive for orthopnea and leg swelling. Negative for chest pain and PND.  Genitourinary: Negative for dysuria, frequency, hematuria and urgency.  Musculoskeletal: Positive for falls and joint pain (ankles bilaterally and right foot dorsum).          Past Medical History:  Diagnosis Date  . Abdominal mass 2008   chronic inflammatory mass of the mesentary Dr [name]  . CAD (coronary artery disease)    cath 09/23/09 70% Dx1 (diffuse disease), 40% Cx, Large RCA 80-90% with heavy calcification / cath 06/2010 no change tiht RCA still best to treat medially with nosebleeds  . Chest pain    myoview 2006 no ischemia/ myoview 2009 no ischemia  . Dyslipidemia   . Hypertension    no RAS  . Hypothyroidism   . Kidney mass 2008   radiofrequency ablation at [facility]  . Left ventricular dysfunction    myoview 2006 normal / myoview 2009 normal / EF 60% cath 09/2009, EF 60% cath 06/2010  . Memory impairment    memory decreasing 06/2010  . Nosebleed    significant, can not take ASA  . Past heart attack    X2  . Rash Sept 2011   rash on buttocks, hospitalized hydralazine stopped but then restarted w/o return of rash  . Systolic click    no mitral valve prolapse    Past Surgical History:  Procedure  Laterality Date  . BACK SURGERY    . BONE MARROW ASPIRATION     X2  . BONE MARROW BIOPSY     X2  . COLONOSCOPY N/A 03/07/2013   Procedure: COLONOSCOPY;  Surgeon: [name], MD;  Location: [facility];  Service: Endoscopy;  Laterality: N/A;  830-moved to 15 Ann to notify pt  . COLONOSCOPY  01/2013  . lymph node removal     right abdomen  . LYMPH NODE removal     robotic laser @ [facility]  on left side of stomach  . RFA RIGHT KIDNEY      Social History: Patient lives with his wife.  The patient walks unassisted at baseline.  He is a former smoker, quit 30 years ago.   Allergies  Allergen Reactions  . Ranolazine  Shortness Of Breath  . Simvastatin Other (See Comments)    fatigue  . Aspirin Other (See Comments)    Causes severe bleeding  . Contrast Media [Iodinated Diagnostic Agents] Other (See Comments)    Will shut kidneys down  . Ibuprofen Other (See Comments)    Nose bleeds  . Ioxaglate Other (See Comments)    Will shut kidneys down    Family history: family history includes Cancer in his maternal grandfather.  Prior to Admission medications   Medication Sig Start Date End Date Taking? Authorizing Provider  acetaminophen (TYLENOL) 500 MG tablet Take 500 mg by mouth every 6 (six) hours as needed for moderate pain.   Yes Historical Provider, MD  albuterol-ipratropium (COMBIVENT) 18-103 MCG/ACT inhaler Inhale 2-4 puffs into the lungs 2 (two) times daily as needed for wheezing.    Yes Historical Provider, MD  carvedilol (COREG) 6.25 MG tablet Take 6.25 mg by mouth 2 (two) times daily with a meal.     Yes Historical Provider, MD  Cholecalciferol (VITAMIN D3) 5000 UNITS CAPS Take 5,000 Units by mouth daily.    Yes Historical Provider, MD  fluticasone (FLONASE) 50 MCG/ACT nasal spray Place 2 sprays into both nostrils at bedtime as needed for allergies.  01/02/16  Yes Historical Provider, MD  furosemide (LASIX) 40 MG tablet Take 20 mg by mouth daily.    Yes Historical Provider, MD  ipratropium (ATROVENT) 0.03 % nasal spray Place 2 sprays into both nostrils 3 (three) times daily.  01/02/16  Yes Historical Provider, MD  levothyroxine (SYNTHROID, LEVOTHROID) 125 MCG tablet Take 125 mcg by mouth daily.    Yes Historical Provider, MD  montelukast (SINGULAIR) 10 MG tablet Take 10 mg by mouth at bedtime.    Yes Historical Provider, MD  multivitamin-iron-minerals-folic acid (THERAPEUTIC-M) TABS tablet Take 1 tablet by mouth daily.   Yes Historical Provider, MD  nitroGLYCERIN (NITRODUR - DOSED IN MG/24 HR) 0.6 mg/hr patch Place 0.6 mg onto the skin daily.   Yes Historical Provider, MD  nitroGLYCERIN (NITROSTAT)  0.4 MG SL tablet Place 1 tablet (0.4 mg total) under the tongue as needed. Patient taking differently: Place 0.4 mg under the tongue as needed for chest pain.  08/19/11  Yes Carlena Bjornstad, MD  Omega-3 Fatty Acids (FISH OIL) 1000 MG CPDR Take 1,000 mg by mouth daily.   Yes Historical Provider, MD  pantoprazole (PROTONIX) 40 MG tablet Take 1 tablet (40 mg total) by mouth daily before breakfast. 03/23/16  Yes [name], MD  pravastatin (PRAVACHOL) 20 MG tablet Take 20 mg by mouth at bedtime. 12/28/12  Yes Historical Provider, MD  tamsulosin (FLOMAX) 0.4 MG CAPS capsule Take 0.4  mg by mouth daily.   Yes Historical Provider, MD  tiotropium (SPIRIVA) 18 MCG inhalation capsule Place 18 mcg into inhaler and inhale daily.   Yes Historical Provider, MD  vitamin B-12 (CYANOCOBALAMIN) 1000 MCG tablet Take 1,000 mcg by mouth daily.     Yes Historical Provider, MD       Physical Exam: BP 105/76   Pulse 78   Temp (S) 98.6 F (37 C) (Rectal)   Resp 24   SpO2 99%  General appearance: Elderly adult male, alert and in no acute distress.   Eyes: Anicteric, conjunctiva pink, lids and lashes normal. PERRL.    ENT: No nasal deformity, discharge, epistaxis.  Hearing normal. OP moist without lesions.   Neck: No neck masses.  Trachea midline.  No thyromegaly/tenderness. Lymph: No cervical or supraclavicular lymphadenopathy. Skin: Warm and dry.  No jaundice.  No suspicious rashes or lesions. Cardiac: Irregularly irregular, nl S1-S2, no murmurs appreciated.  Capillary refill is brisk.  JVP elevated to mid neck, HJR.  Pitting LE to shin edema.  Radial pulses 2+ and symmetric. Respiratory: Normal respiratory rate and rhythm.  CTAB without rales or wheezes. Abdomen: Abdomen soft.  No TTP. No ascites, distension, hepatosplenomegaly.   MSK: No deformities or effusions.  No cyanosis or clubbing.  The left ankle lateral malleolus has some mild reddening, with mild pain on the anterior malleolus, not point tenderness.   The right foot is most tender over the dorsum where it is most red and swollen, no tenderness over lateral malleolus there. Neuro: Cranial nerves normal.  Sensation intact to light touch. Speech is fluent.  Muscle strength 4+/5 and symmetric in upper and lower extremities.    Psych: Sensorium intact and responding to questions, attention normal.  Behavior appropriate.  Affect normal.  Judgment and insight appear moderately impaired, suspect MCI.     Labs on Admission:  I have personally reviewed following labs and imaging studies: CBC:  Recent Labs Lab 09/03/16 1605  WBC 27.1*  NEUTROABS 1.9  HGB 12.0*  HCT 35.2*  MCV 88.0  PLT 970*   Basic Metabolic Panel:  Recent Labs Lab 09/03/16 1605  NA 144  K 3.6  CL 108  CO2 26  GLUCOSE 117*  BUN 41*  CREATININE 2.53*  CALCIUM 9.5   GFR: CrCl cannot be calculated (Unknown ideal weight.).  Liver Function Tests: No results for input(s): AST, ALT, ALKPHOS, BILITOT, PROT, ALBUMIN in the last 168 hours. No results for input(s): LIPASE, AMYLASE in the last 168 hours. No results for input(s): AMMONIA in the last 168 hours. Coagulation Profile: No results for input(s): INR, PROTIME in the last 168 hours. Cardiac Enzymes:  Recent Labs Lab 09/03/16 2314  TROPONINI 0.79*   BNP (last 3 results) No results for input(s): PROBNP in the last 8760 hours. HbA1C: No results for input(s): HGBA1C in the last 72 hours. CBG: No results for input(s): GLUCAP in the last 168 hours. Lipid Profile: No results for input(s): CHOL, HDL, LDLCALC, TRIG, CHOLHDL, LDLDIRECT in the last 72 hours. Thyroid Function Tests: No results for input(s): TSH, T4TOTAL, FREET4, T3FREE, THYROIDAB in the last 72 hours. Anemia Panel: No results for input(s): VITAMINB12, FOLATE, FERRITIN, TIBC, IRON, RETICCTPCT in the last 72 hours. Sepsis Labs: Lactic acid 1.5 Invalid input(s): PROCALCITONIN, LACTICIDVEN No results found for this or any previous visit (from the  past 240 hour(s)).       Radiological Exams on Admission: Personally reviewed shows mild edema: Dg Chest 2 View  Result Date:  09/03/2016 CLINICAL DATA:  Bilateral ankle swelling worse over the past week. History of coronary artery disease and hypertension. Former smoker. Cough and fever. EXAM: CHEST  2 VIEW COMPARISON:  11/28/2015 FINDINGS: Shallow inspiration. Mild cardiac enlargement with pulmonary vascular congestion. Slight interstitial pattern to the lung bases may represent early interstitial edema. No focal consolidation. No blunting of costophrenic angles. No pneumothorax. Calcified and tortuous aorta. Degenerative changes in the spine. IMPRESSION: Mild congestive changes in the heart and lungs with mild interstitial edema. No effusion or consolidation. Electronically Signed   By: Lucienne Capers M.D.   On: 09/03/2016 21:43   Dg Ankle Complete Left  Result Date: 09/03/2016 CLINICAL DATA:  Ankle swelling EXAM: LEFT ANKLE COMPLETE - 3+ VIEW COMPARISON:  08/03/2016 FINDINGS: Avulsion injury medial malleolus as before, probably chronic. Slight lucency in the lateral talar dome. Probable old avulsion injury fibular malleolus. Tiny os ossific densities project over medial joint space, cannot exclude tiny loose bodies. Soft tissue swelling, moderate. Mild cortical irregularity of the anterior inferior tibia, partially related to degenerative changes. IMPRESSION: 1. Moderate soft tissue swelling. No definite acute osseous abnormality. 2. Probable old avulsion injuries of the medial and lateral malleolus. 3. Cortical irregularity of the anterior inferior tibia, likely on the basis of degenerative changes. 4. Questionable OCD lesion lateral tailor dome. Electronically Signed   By: Donavan Foil M.D.   On: 09/03/2016 21:28   Dg Ankle Complete Right  Result Date: 09/03/2016 CLINICAL DATA:  Bilateral ankle swelling, worse over the past week. Patient is on a diuretic. Fall. EXAM: RIGHT ANKLE - COMPLETE  3+ VIEW COMPARISON:  Right foot 04/10/2005 FINDINGS: Focal linear lucency and cortical irregularity at the medial malleolus may represent avulsion fracture. Medial soft tissue swelling about the right ankle. No acute displaced fractures are identified. Degenerative changes in the right ankle and intertarsal joints. No focal bone lesion or bone destruction. Vascular calcifications in the soft tissues. IMPRESSION: Focal cortical changes of the medial malleolus may indicate an avulsion fracture. Soft tissue swelling. Degenerative changes. Electronically Signed   By: Lucienne Capers M.D.   On: 09/03/2016 21:26   Dg Foot Complete Left  Result Date: 09/03/2016 CLINICAL DATA:  Bilateral ankle swelling worse over the past week. Fall. EXAM: LEFT FOOT - COMPLETE 3+ VIEW COMPARISON:  None. FINDINGS: Soft tissue swelling over the left forefoot. Mild degenerative changes in the intertarsal and first metatarsal-phalangeal joints. No acute fracture or dislocation. No focal bone lesion or bone destruction. IMPRESSION: Mild soft tissue swelling. Degenerative changes. No acute bony abnormalities. Electronically Signed   By: Lucienne Capers M.D.   On: 09/03/2016 21:29   Dg Foot Complete Right  Result Date: 09/03/2016 CLINICAL DATA:  Bilateral ankle swelling, worse over the past week. Fall. EXAM: RIGHT FOOT COMPLETE - 3+ VIEW COMPARISON:  Right foot 04/10/2005 FINDINGS: Soft tissue swelling over the dorsum of the right foot. Mild degenerative changes in the intertarsal joints. No evidence of acute fracture or dislocation. Vague focal lucency in the calcaneus is unchanged since prior study and probably represents a small bone cyst or lipoma. No destructive or expansile bone lesions. IMPRESSION: Dorsal soft tissue swelling over the right foot. Mild degenerative changes. No acute bony abnormalities. Electronically Signed   By: Lucienne Capers M.D.   On: 09/03/2016 21:32   Dg Hip Unilat With Pelvis 2-3 Views Left  Result  Date: 09/03/2016 CLINICAL DATA:  Hip pain.  Injury. EXAM: DG HIP (WITH OR WITHOUT PELVIS) 2-3V LEFT COMPARISON:  None. FINDINGS:  Degenerative changes lumbar spine and both hips. No acute bony abnormality. No evidence of fracture or dislocation. Aortoiliac atherosclerotic vascular calcification. Stable calcific densities noted over the left abdomen. Surgical clips in the pelvis. IMPRESSION: 1. No acute bony or joint abnormality. Degenerative changes lumbar spine and both hips. No evidence of fracture. 2. Aortoiliac atherosclerotic vascular disease . Electronically Signed   By: Marcello Moores  Register   On: 09/03/2016 16:38    EKG: Independently reviewed. Rate 99, atrial fibrillation.  New lateral TWI.      Assessment/Plan  1. Acute on chronic systolic CHF:  EF 64%.   -Furosemide 40 mg IV BID -Strict I/Os, daily weights -Daily BMP -Tele -Obtain echocardiogram -Continue home carvedilol -K suppl   2. New onset Atrial fibrillation:  CHADs2Vasc 8 (age, HTN, CHF, TIA, vascular disease, DM) -Heparin gtt bridge -CM consult regarding coverage of oral AC -Check TSH, mag  3. Elevated troponin:  Suspect demand.  No chest pain. -Trend troponin -Consult to Cardiology, appreciate cares  4. Possible cellulitis:  Given reported fever at home and otherwise hard to explain leukocytosis, will treat right foot cellulitis. -Cefazolin IV  5. Acute kidney injury:  Baseline Cr 1.6 in Feb.  Suspect congestive nephropathy. -Diuresis and close monitoring of renal function  6. Hypothyroidism:  -Continue levothyroxine  7. HTN and CAD secondary prevention and chronic angina:  -Continue carvedilol -Continue nitro topical  8. Thrombocytopenia: Slightly worse than last.  9. Diabetes: Not on home medication.   -Check HgbA1c -SSI with meals  10. COPD: Clinically inactive. -Continue Spiriva -Albuterol PRN -Continue montelukast  11. Other medications: -Continue PPI -Continue tamsulosin     DVT  prophylaxis: Heparin gtt Code Status: FULL  Family Communication: Wife and son at bedside  Disposition Plan: Anticipate IV diuresis for CHF.  New anticoagulation for Afib.  Consult to Cardiology, appreciate cares.  Trend troponin.  Treat cellulitis in right foot with cefazolin. Consults called: Cardiology overnight Admission status: INPATIENT, tele       Medical decision making: Patient seen at 11:20 AM on 09/03/2016.  The patient was discussed with Charlann Lange, PA-C and Cardiology fellow.  What exists of the patient's chart was reviewed in depth and outside records in [facility] were reviewed and summarized above.  Clinical condition: stable.        Edwin Dada [facility] Pager [phone]

## 2016-09-04 NOTE — Progress Notes (Signed)
ANTICOAGULATION CONSULT NOTE - Initial Consult  Pharmacy Consult for Heparin Indication: atrial fibrillation  Allergies  Allergen Reactions  . Ranolazine Shortness Of Breath  . Simvastatin Other (See Comments)    fatigue  . Aspirin Other (See Comments)    Causes severe bleeding  . Contrast Media [Iodinated Diagnostic Agents] Other (See Comments)    Will shut kidneys down  . Ibuprofen Other (See Comments)    Nose bleeds  . Ioxaglate Other (See Comments)    Will shut kidneys down    Patient Measurements: Height: 6\' 2"  (188 cm) Weight: 200 lb 9.6 oz (91 kg) IBW/kg (Calculated) : 82.2 Heparin Dosing Weight:   Vital Signs: Temp: 97.5 F (36.4 C) (11/25 0000) Temp Source: Axillary (11/25 0000) BP: 188/73 (11/25 0000) Pulse Rate: 82 (11/25 0000)  Labs:  Recent Labs  09/03/16 1605 09/03/16 2314  HGB 12.0*  --   HCT 35.2*  --   PLT 101*  --   CREATININE 2.53*  --   TROPONINI  --  0.79*    Estimated Creatinine Clearance: 23 mL/min (by C-G formula based on SCr of 2.53 mg/dL (H)).   Medical History: Past Medical History:  Diagnosis Date  . Abdominal mass 2008   chronic inflammatory mass of the mesentary Dr [name]  . CAD (coronary artery disease)    cath 09/23/09 70% Dx1 (diffuse disease), 40% Cx, Large RCA 80-90% with heavy calcification / cath 06/2010 no change tiht RCA still best to treat medially with nosebleeds  . Chest pain    myoview 2006 no ischemia/ myoview 2009 no ischemia  . Dyslipidemia   . Hypertension    no RAS  . Hypothyroidism   . Kidney mass 2008   radiofrequency ablation at [facility]  . Left ventricular dysfunction    myoview 2006 normal / myoview 2009 normal / EF 60% cath 09/2009, EF 60% cath 06/2010  . Memory impairment    memory decreasing 06/2010  . Nosebleed    significant, can not take ASA  . Past heart attack    X2  . Rash Sept 2011   rash on buttocks, hospitalized hydralazine stopped but then restarted w/o return of rash  .  Systolic click    no mitral valve prolapse    Medications:  Prescriptions Prior to Admission  Medication Sig Dispense Refill Last Dose  . acetaminophen (TYLENOL) 500 MG tablet Take 500 mg by mouth every 6 (six) hours as needed for moderate pain.   09/02/2016 at Unknown time  . albuterol-ipratropium (COMBIVENT) 18-103 MCG/ACT inhaler Inhale 2-4 puffs into the lungs 2 (two) times daily as needed for wheezing.    09/02/2016 at Unknown time  . carvedilol (COREG) 6.25 MG tablet Take 6.25 mg by mouth 2 (two) times daily with a meal.     09/02/2016 at 2200  . Cholecalciferol (VITAMIN D3) 5000 UNITS CAPS Take 5,000 Units by mouth daily.    09/02/2016 at Unknown time  . fluticasone (FLONASE) 50 MCG/ACT nasal spray Place 2 sprays into both nostrils at bedtime as needed for allergies.    Past Week at Unknown time  . furosemide (LASIX) 40 MG tablet Take 20 mg by mouth daily.    09/02/2016 at Unknown time  . ipratropium (ATROVENT) 0.03 % nasal spray Place 2 sprays into both nostrils 3 (three) times daily.    08/29/2016  . levothyroxine (SYNTHROID, LEVOTHROID) 125 MCG tablet Take 125 mcg by mouth daily.    09/02/2016 at Unknown time  . montelukast (SINGULAIR) 10 MG  tablet Take 10 mg by mouth at bedtime.    09/02/2016 at Unknown time  . multivitamin-iron-minerals-folic acid (THERAPEUTIC-M) TABS tablet Take 1 tablet by mouth daily.   09/02/2016 at Unknown time  . nitroGLYCERIN (NITRODUR - DOSED IN MG/24 HR) 0.6 mg/hr patch Place 0.6 mg onto the skin daily.   09/03/2016 at Unknown time  . nitroGLYCERIN (NITROSTAT) 0.4 MG SL tablet Place 1 tablet (0.4 mg total) under the tongue as needed. (Patient taking differently: Place 0.4 mg under the tongue as needed for chest pain. ) 25 tablet 6 unknown  . Omega-3 Fatty Acids (FISH OIL) 1000 MG CPDR Take 1,000 mg by mouth daily.   09/02/2016 at Unknown time  . pantoprazole (PROTONIX) 40 MG tablet Take 1 tablet (40 mg total) by mouth daily before breakfast. 90 tablet 3  09/02/2016 at Unknown time  . pravastatin (PRAVACHOL) 20 MG tablet Take 20 mg by mouth at bedtime.   09/02/2016 at Unknown time  . tamsulosin (FLOMAX) 0.4 MG CAPS capsule Take 0.4 mg by mouth daily.   09/02/2016 at Unknown time  . tiotropium (SPIRIVA) 18 MCG inhalation capsule Place 18 mcg into inhaler and inhale daily.   09/02/2016 at Unknown time  . vitamin B-12 (CYANOCOBALAMIN) 1000 MCG tablet Take 1,000 mcg by mouth daily.     09/02/2016 at Unknown time   Infusions:  . heparin      Assessment: Patient with afib and MD wants pharmacy to dose heparin.  No oral anticoagulants noted on med rec.   Baseline coags ordered.  Goal of Therapy:  Heparin level 0.3-0.7 units/ml Monitor platelets by anticoagulation protocol: Yes   Plan:  Heparin bolus 2500 units iv x1 Heparin drip at 1400 units/hr Daily CBC Next heparin level at [place], Shea Stakes [place] 09/04/2016,1:27 AM

## 2016-09-05 DIAGNOSIS — N179 Acute kidney failure, unspecified: Secondary | ICD-10-CM

## 2016-09-05 DIAGNOSIS — I5043 Acute on chronic combined systolic (congestive) and diastolic (congestive) heart failure: Secondary | ICD-10-CM

## 2016-09-05 DIAGNOSIS — J181 Lobar pneumonia, unspecified organism: Secondary | ICD-10-CM

## 2016-09-05 DIAGNOSIS — N183 Chronic kidney disease, stage 3 (moderate): Secondary | ICD-10-CM

## 2016-09-05 DIAGNOSIS — I2510 Atherosclerotic heart disease of native coronary artery without angina pectoris: Secondary | ICD-10-CM

## 2016-09-05 DIAGNOSIS — I480 Paroxysmal atrial fibrillation: Secondary | ICD-10-CM

## 2016-09-05 DIAGNOSIS — D696 Thrombocytopenia, unspecified: Secondary | ICD-10-CM

## 2016-09-05 DIAGNOSIS — I2583 Coronary atherosclerosis due to lipid rich plaque: Secondary | ICD-10-CM

## 2016-09-05 DIAGNOSIS — D72829 Elevated white blood cell count, unspecified: Secondary | ICD-10-CM

## 2016-09-05 LAB — CBC
HEMATOCRIT: 31 % — AB (ref 39.0–52.0)
HEMOGLOBIN: 10.3 g/dL — AB (ref 13.0–17.0)
MCH: 29.9 pg (ref 26.0–34.0)
MCHC: 33.2 g/dL (ref 30.0–36.0)
MCV: 89.9 fL (ref 78.0–100.0)
Platelets: 100 10*3/uL — ABNORMAL LOW (ref 150–400)
RBC: 3.45 MIL/uL — AB (ref 4.22–5.81)
RDW: 18.3 % — ABNORMAL HIGH (ref 11.5–15.5)
WBC: 23.1 10*3/uL — ABNORMAL HIGH (ref 4.0–10.5)

## 2016-09-05 LAB — BASIC METABOLIC PANEL
ANION GAP: 8 (ref 5–15)
BUN: 36 mg/dL — ABNORMAL HIGH (ref 6–20)
CHLORIDE: 106 mmol/L (ref 101–111)
CO2: 27 mmol/L (ref 22–32)
Calcium: 9 mg/dL (ref 8.9–10.3)
Creatinine, Ser: 2.31 mg/dL — ABNORMAL HIGH (ref 0.61–1.24)
GFR calc non Af Amer: 23 mL/min — ABNORMAL LOW (ref >60)
GFR, EST AFRICAN AMERICAN: 27 mL/min — AB (ref >60)
GLUCOSE: 100 mg/dL — AB (ref 65–99)
POTASSIUM: 4 mmol/L (ref 3.5–5.1)
Sodium: 141 mmol/L (ref 135–145)

## 2016-09-05 LAB — HEMOGLOBIN A1C
HEMOGLOBIN A1C: 6.4 % — AB (ref 4.8–5.6)
Mean Plasma Glucose: 137 mg/dL

## 2016-09-05 LAB — GLUCOSE, CAPILLARY
GLUCOSE-CAPILLARY: 103 mg/dL — AB (ref 65–99)
Glucose-Capillary: 130 mg/dL — ABNORMAL HIGH (ref 65–99)
Glucose-Capillary: 93 mg/dL (ref 65–99)
Glucose-Capillary: 129 mg/dL — ABNORMAL HIGH (ref 65–99)

## 2016-09-05 MED ORDER — IPRATROPIUM-ALBUTEROL 0.5-2.5 (3) MG/3ML IN SOLN
3.0000 mL | RESPIRATORY_TRACT | Status: DC
  Administered 2016-09-05: 3 mL via RESPIRATORY_TRACT
  Filled 2016-09-05: qty 3

## 2016-09-05 MED ORDER — IPRATROPIUM-ALBUTEROL 0.5-2.5 (3) MG/3ML IN SOLN
RESPIRATORY_TRACT | Status: AC
  Filled 2016-09-05: qty 3

## 2016-09-05 MED ORDER — IPRATROPIUM-ALBUTEROL 0.5-2.5 (3) MG/3ML IN SOLN
3.0000 mL | Freq: Four times a day (QID) | RESPIRATORY_TRACT | Status: DC
  Administered 2016-09-05 – 2016-09-07 (×7): 3 mL via RESPIRATORY_TRACT
  Filled 2016-09-05 (×7): qty 3

## 2016-09-05 MED ORDER — IPRATROPIUM-ALBUTEROL 0.5-2.5 (3) MG/3ML IN SOLN
3.0000 mL | RESPIRATORY_TRACT | Status: DC | PRN
  Administered 2016-09-05: 3 mL via RESPIRATORY_TRACT

## 2016-09-05 MED ORDER — GUAIFENESIN ER 600 MG PO TB12
600.0000 mg | ORAL_TABLET | Freq: Two times a day (BID) | ORAL | Status: DC
  Administered 2016-09-05 – 2016-09-13 (×17): 600 mg via ORAL
  Filled 2016-09-05 (×17): qty 1

## 2016-09-05 MED ORDER — LEVOFLOXACIN 500 MG PO TABS
500.0000 mg | ORAL_TABLET | ORAL | Status: DC
  Administered 2016-09-05 – 2016-09-07 (×2): 500 mg via ORAL
  Filled 2016-09-05 (×3): qty 1

## 2016-09-05 MED ORDER — BENZONATATE 100 MG PO CAPS
100.0000 mg | ORAL_CAPSULE | Freq: Two times a day (BID) | ORAL | Status: DC
  Administered 2016-09-05 – 2016-09-13 (×17): 100 mg via ORAL
  Filled 2016-09-05 (×17): qty 1

## 2016-09-05 NOTE — Progress Notes (Signed)
Pharmacy Antibiotic Note  Brian Mcmillan is a 82 y.o. male admitted on 09/03/2016 with pneumonia.  Pharmacy has been consulted for levaquin dosing.  Plan: levaquin 500mg  po q48h for CrCl <92mls/min F/u renal function  Height: 6\' 2"  (188 cm) Weight: 196 lb 8 oz (89.1 kg) IBW/kg (Calculated) : 82.2  Temp (24hrs), Avg:98.5 F (36.9 C), Min:97.9 F (36.6 C), Max:99.3 F (37.4 C)   Recent Labs Lab 09/03/16 1605 09/03/16 2229 09/04/16 0535 09/04/16 1010 09/04/16 2030 09/05/16 0544  WBC 27.1*  --  20.8*  --   --  23.1*  CREATININE 2.53*  --   --  2.37* 2.44* 2.31*  LATICACIDVEN  --  1.5  --   --   --   --     Estimated Creatinine Clearance: 25.2 mL/min (by C-G formula based on SCr of 2.31 mg/dL (H)).    Allergies  Allergen Reactions  . Ranolazine Shortness Of Breath  . Simvastatin Other (See Comments)    fatigue  . Aspirin Other (See Comments)    Causes severe bleeding  . Contrast Media [Iodinated Diagnostic Agents] Other (See Comments)    Will shut kidneys down  . Ibuprofen Other (See Comments)    Nose bleeds  . Ioxaglate Other (See Comments)    Will shut kidneys down    Antimicrobials this admission: 11/25 cefazolin >> 11/26 levaquin >>   Microbiology results: 11/24 BCx: ngtd  Thank you for allowing pharmacy to be a part of this patient's care.  Dolly Rias RPh 09/05/2016, 9:47 AM Pager [phone]

## 2016-09-05 NOTE — Progress Notes (Signed)
Patient Name: Brian Mcmillan Date of Encounter: 09/05/2016  Primary Cardiologist: Alethia Berthold [facility] [place] Problem List     Principal Problem:   Acute on chronic systolic CHF (congestive heart failure) (HCC) Active Problems:   Hypothyroidism, acquired   Essential hypertension   Coronary artery disease due to lipid rich plaque   Acute on chronic kidney failure (HCC)   Thrombocytopenia (HCC)   Cellulitis   Acute on chronic combined systolic and diastolic CHF, NYHA class 2 (HCC)   Paroxysmal atrial fibrillation ([place])     Subjective   Feels better. Foot pain and swelling substantially improved. Denies dyspnea. No arrhythmia recurrence  Inpatient Medications    Scheduled Meds: . benzonatate  100 mg Oral BID  . carvedilol  6.25 mg Oral BID WC  .  ceFAZolin (ANCEF) IV  1 g Intravenous Q12H  . furosemide  40 mg Intravenous BID  . guaiFENesin  600 mg Oral BID  . insulin aspart  0-5 Units Subcutaneous QHS  . insulin aspart  0-9 Units Subcutaneous TID WC  . ipratropium-albuterol  3 mL Nebulization Q4H  . ipratropium-albuterol      . levofloxacin  500 mg Oral Q48H  . levothyroxine  125 mcg Oral QAC breakfast  . montelukast  10 mg Oral QHS  . nitroGLYCERIN  0.6 mg Transdermal Daily  . pantoprazole  40 mg Oral QAC breakfast  . potassium chloride  40 mEq Oral BID  . pravastatin  20 mg Oral QHS  . tamsulosin  0.4 mg Oral Daily   Continuous Infusions:  PRN Meds: acetaminophen **OR** acetaminophen, ipratropium-albuterol, ondansetron **OR** ondansetron (ZOFRAN) IV, traMADol   Vital Signs    Vitals:   09/04/16 1949 09/05/16 0654 09/05/16 0902 09/05/16 0912  BP: (!) 139/56 (!) 176/72    Pulse: 68 77    Resp: 20 20    Temp: 99.3 F (37.4 C) 97.9 F (36.6 C)    TempSrc: Oral Oral    SpO2: 95% 95% 92% 92%  Weight:  196 lb 8 oz (89.1 kg)    Height:        Intake/Output Summary (Last 24 hours) at 09/05/16 1044 Last data filed at 09/05/16 0657  Gross per 24 hour  Intake               100 ml  Output             2425 ml  Net            -2325 ml   Filed Weights   09/04/16 0000 09/04/16 0453 09/05/16 0654  Weight: 200 lb 9.6 oz (91 kg) 202 lb (91.6 kg) 196 lb 8 oz (89.1 kg)    Physical Exam   General: Alert, oriented x3, no distress Head: no evidence of trauma, PERRL, EOMI, no exophtalmos or lid lag, no myxedema, no xanthelasma; normal ears, nose and oropharynx Neck: normal jugular venous pulsations and no hepatojugular reflux; brisk carotid pulses without delay and no carotid bruits Chest: clear to auscultation, no signs of consolidation by percussion or palpation, normal fremitus, symmetrical and full respiratory excursions Cardiovascular: normal position and quality of the apical impulse, regular rhythm, normal first heart sound and normal second heart sound, no rubs or gallops, no murmur Abdomen: no tenderness or distention, no masses by palpation, no abnormal pulsatility or arterial bruits, normal bowel sounds, no hepatosplenomegaly Extremities: no clubbing, cyanosis;  1+ symmetrical ankle edema; 2+ radial, ulnar and brachial pulses bilaterally; 2+ right femoral, posterior tibial and dorsalis  pedis pulses; 2+ left femoral, posterior tibial and dorsalis pedis pulses; no subclavian or femoral bruits Neurological: grossly nonfocal   Labs    CBC  Recent Labs  09/03/16 1605 09/04/16 0535 09/05/16 0544  WBC 27.1* 20.8* 23.1*  NEUTROABS 1.9  --   --   HGB 12.0* 10.1* 10.3*  HCT 35.2* 30.2* 31.0*  MCV 88.0 89.9 89.9  PLT 101* 88* [id number]*   Basic Metabolic Panel  Recent Labs  09/03/16 2314  09/04/16 2030 09/05/16 0544  NA  --   < > 137 141  K  --   < > 3.9 4.0  CL  --   < > 102 106  CO2  --   < > 26 27  GLUCOSE  --   < > 112* 100*  BUN  --   < > 37* 36*  CREATININE  --   < > 2.44* 2.31*  CALCIUM  --   < > 8.9 9.0  MG 1.8  --   --   --   < > = values in this interval not displayed. Liver Function Tests No results for input(s): AST, ALT,  ALKPHOS, BILITOT, PROT, ALBUMIN in the last 72 hours. No results for input(s): LIPASE, AMYLASE in the last 72 hours. Cardiac Enzymes  Recent Labs  09/04/16 0535 09/04/16 1010 09/04/16 1620  TROPONINI 0.47* 0.54* 0.62*   BNP Invalid input(s): POCBNP D-Dimer No results for input(s): DDIMER in the last 72 hours. Hemoglobin A1C  Recent Labs  09/04/16 0535  HGBA1C 6.4*   Fasting Lipid Panel No results for input(s): CHOL, HDL, LDLCALC, TRIG, CHOLHDL, LDLDIRECT in the last 72 hours. Thyroid Function Tests  Recent Labs  09/03/16 1602  TSH 1.804    Telemetry    NSR with occ PACs and PVCs - Personally Reviewed  Radiology    Dg Chest 2 View  Result Date: 09/03/2016 CLINICAL DATA:  Bilateral ankle swelling worse over the past week. History of coronary artery disease and hypertension. Former smoker. Cough and fever. EXAM: CHEST  2 VIEW COMPARISON:  11/28/2015 FINDINGS: Shallow inspiration. Mild cardiac enlargement with pulmonary vascular congestion. Slight interstitial pattern to the lung bases may represent early interstitial edema. No focal consolidation. No blunting of costophrenic angles. No pneumothorax. Calcified and tortuous aorta. Degenerative changes in the spine. IMPRESSION: Mild congestive changes in the heart and lungs with mild interstitial edema. No effusion or consolidation. Electronically Signed   By: Lucienne Capers M.D.   On: 09/03/2016 21:43   Dg Ankle Complete Left  Result Date: 09/03/2016 CLINICAL DATA:  Ankle swelling EXAM: LEFT ANKLE COMPLETE - 3+ VIEW COMPARISON:  08/03/2016 FINDINGS: Avulsion injury medial malleolus as before, probably chronic. Slight lucency in the lateral talar dome. Probable old avulsion injury fibular malleolus. Tiny os ossific densities project over medial joint space, cannot exclude tiny loose bodies. Soft tissue swelling, moderate. Mild cortical irregularity of the anterior inferior tibia, partially related to degenerative changes.  IMPRESSION: 1. Moderate soft tissue swelling. No definite acute osseous abnormality. 2. Probable old avulsion injuries of the medial and lateral malleolus. 3. Cortical irregularity of the anterior inferior tibia, likely on the basis of degenerative changes. 4. Questionable OCD lesion lateral tailor dome. Electronically Signed   By: Donavan Foil M.D.   On: 09/03/2016 21:28   Dg Ankle Complete Right  Result Date: 09/03/2016 CLINICAL DATA:  Bilateral ankle swelling, worse over the past week. Patient is on a diuretic. Fall. EXAM: RIGHT ANKLE - COMPLETE 3+ VIEW COMPARISON:  Right foot 04/10/2005 FINDINGS: Focal linear lucency and cortical irregularity at the medial malleolus may represent avulsion fracture. Medial soft tissue swelling about the right ankle. No acute displaced fractures are identified. Degenerative changes in the right ankle and intertarsal joints. No focal bone lesion or bone destruction. Vascular calcifications in the soft tissues. IMPRESSION: Focal cortical changes of the medial malleolus may indicate an avulsion fracture. Soft tissue swelling. Degenerative changes. Electronically Signed   By: Lucienne Capers M.D.   On: 09/03/2016 21:26   Dg Foot Complete Left  Result Date: 09/03/2016 CLINICAL DATA:  Bilateral ankle swelling worse over the past week. Fall. EXAM: LEFT FOOT - COMPLETE 3+ VIEW COMPARISON:  None. FINDINGS: Soft tissue swelling over the left forefoot. Mild degenerative changes in the intertarsal and first metatarsal-phalangeal joints. No acute fracture or dislocation. No focal bone lesion or bone destruction. IMPRESSION: Mild soft tissue swelling. Degenerative changes. No acute bony abnormalities. Electronically Signed   By: Lucienne Capers M.D.   On: 09/03/2016 21:29   Dg Foot Complete Right  Result Date: 09/03/2016 CLINICAL DATA:  Bilateral ankle swelling, worse over the past week. Fall. EXAM: RIGHT FOOT COMPLETE - 3+ VIEW COMPARISON:  Right foot 04/10/2005 FINDINGS:  Soft tissue swelling over the dorsum of the right foot. Mild degenerative changes in the intertarsal joints. No evidence of acute fracture or dislocation. Vague focal lucency in the calcaneus is unchanged since prior study and probably represents a small bone cyst or lipoma. No destructive or expansile bone lesions. IMPRESSION: Dorsal soft tissue swelling over the right foot. Mild degenerative changes. No acute bony abnormalities. Electronically Signed   By: Lucienne Capers M.D.   On: 09/03/2016 21:32   Dg Hip Unilat With Pelvis 2-3 Views Left  Result Date: 09/03/2016 CLINICAL DATA:  Hip pain.  Injury. EXAM: DG HIP (WITH OR WITHOUT PELVIS) 2-3V LEFT COMPARISON:  None. FINDINGS: Degenerative changes lumbar spine and both hips. No acute bony abnormality. No evidence of fracture or dislocation. Aortoiliac atherosclerotic vascular calcification. Stable calcific densities noted over the left abdomen. Surgical clips in the pelvis. IMPRESSION: 1. No acute bony or joint abnormality. Degenerative changes lumbar spine and both hips. No evidence of fracture. 2. Aortoiliac atherosclerotic vascular disease . Electronically Signed   By: Marcello Moores  Register   On: 09/03/2016 16:38    Cardiac Studies   09/04/16 echo  - Left ventricle: The cavity size was normal. There was moderate concentric hypertrophy. Systolic function was normal. The estimated ejection fraction was in the range of 55% to 60%. Doppler parameters are consistent with abnormal left ventricular relaxation (grade 1 diastolic dysfunction). Doppler parameters are consistent with high ventricular filling pressure. - Regional wall motion abnormality: Possible hypokinesis of the basal inferior myocardium. - Aortic valve: Mildly calcified annulus. Trileaflet. - Mitral valve: Mildly calcified annulus. Mildly thickened leaflets . There was trivial regurgitation. - Left atrium: The atrium was mildly dilated.  Patient Profile     82 yo man  with CAD and mild ischemic CMP, presents with new onset atrial fibrillation (resolved) and HF exacerbation, as well as worsened renal failure and severe bilateral foot pain, erythema and elevated WBC. History of recurrent severe epistaxis has prevented use of ASA or clopidogrel.  Assessment & Plan    1. Afib: Newly recognized. He was unaware of palpitations. He did have worsening heart failure which might have been triggered by atrial fibrillation (or vice versa). High likelihood that this arrhythmia has previously occurred asymptomatically and high likelihood that he will have  it again in the future. Unfortunately he reports that he is unable to take even aspirin due to frequent severe epistaxis which has led to emergency room visits. CHADSVasc 5 (age 41, CAD, HF, HTN), although without previous history of CVA/TIA. He may be a candidate for a WATCHMAN device if he can take warfarin for 1 month. At this point, he declines initiation of anticoagulants. I think he should have this discussion with his primary cardiologist, Dr. Alethia Berthold. 2. CHF: Acute on chronic/combined systolic and diastolic, possible exacerbation due to atrial fibrillation. Substantial net diuresis > 2L/24 h without worsening of renal function, actually slight improvement. On carvedilol. Not on RAAS inh. due to renal insufficiency. 3. Abnormal troponin: I do not think this is a sign of a true acute coronary syndrome. He does not have angina pectoris. His ECG shows chronic repolarization abnormalities. Not a great candidate for invasive evaluation due to renal insufficiency. Ideally he should be on long-term aspirin but this has been avoided for the same reasons described above. 4. CAD: ECG and echo evidence of previous inferior wall myocardial infarction, last functional study was July 2012 with a scar in the basal inferior wall and mild inferolateral basal ischemia. 5. HTN: Controlled 6. Acute on chronic renal failure: Might be related to heart  failure exacerbation. Stable/slight improvement on diuretics 7. Possible gout right first metatarsophalangeal joint. Elevated uric acid. Consider colchicine versus prednisone. Avoid NSAIDs. 8. Mild thrombocytopenia: Not sure if this has any relationship to his tendency for nosebleeds. Also not sure of etiology. In and of itself should not preclude use of antiplatelet/anticoagulants. 9. COPD 10. Right kidney mass [facility]. [facility] - Dr. [name]  Signed, Sanda Klein, MD  09/05/2016, 10:44 AM

## 2016-09-05 NOTE — Progress Notes (Addendum)
Patient ID: Brian Mcmillan, male   DOB: 1925/04/07, 82 y.o.   MRN: [id number]  PROGRESS NOTE    Brian Mcmillan  [id number] DOB: 30-Sep-1925 DOA: 09/03/2016  PCP: Brian Mcmillan., MD   Brief Narrative:  82 y.o. male with a past medical history significant for ischemic heart disease and CHF EF 45%, HTN, hypothyroidism, renal CA s/p ablation, hx of TIA, MGUS and NIDDM who presented to [facility] ED with bilateral ankle pain, swelling for past 203 weeks prior to this admission. Pt also reproted ongoing cough intermittently productive of clear sputum, fevers.  In ED, pt afebrile, HR in 90's, irregular. Blood work showed Cr 2.53 (baseline 1.4-1.7), WBC count 27.1, platelets 101, lactic acid 1.5. The 12 lead EKG showed new Afib. BNP was 670 and troponin 0.70. Pt seen by cardio in consultation.   Assessment & Plan:   Principal Problem:   Acute on chronic systolic and diastolic CHF (congestive heart failure) ([place]) / Acute respiratory failure with hypoxia - CXR on admission with mild congestive changes - 2-D echo on this admission showed ejection fraction [id number], grade 1 diastolic dysfunction - BNP on admission 670 - Continue lasix 40 mg IV BID - Continue daily weight and strict intake and output - Appreciate cardiology following and their recommendations - Replete electrolytes as needed  Active Problems:   Lobar pneumonia, unspecified organism - Started empiric Levaquin 09/05/2016  - Leukocytosis improving  - Blood cx so far show no growth     New onset atrial fibrillation  - CHADS vasc score 5 - Pt reported he cannot take aspirin due to epistaxis - Per cardio, may be a candidate for a watchman device if he can take warfarin for 1 month. At this point, he declines initiation of anticoagulants - Rate controlled with carvedilol     Elevate troponin level - Demand ischemia from acute CHF, a fib  - EKG with chronic repolarization abnormalities - Not a candidate for invasive evaluation per  cardio    Right foot cellulitis / Leukocytosis - Continue cefazolin - Soft tissue swelling on x rays     CKD stage 3 - Cr at baseline 1.77 - Cr on this admission 2.53, likely due to lasix - Cr 2.44 --> 2.31    Thrombocytopenia - Due to history of MGUS - Monitor platelet count - Platelets 100    Anemia of chronic disease - Due to CKD and MGUS - Hgb stable      DVT prophylaxis: SCD's bilaterally  Code Status: full code  Family Communication: wife at the bedside this am Disposition Plan: home or SNF once cleared by cardio    Consultants:   Cardio  Procedures:   None  Antimicrobials:   Cefazolin 09/03/2016 --> she thinks  Levaquin 09/05/2016 -->    Subjective: Feels weak and tired.  Objective: Vitals:   09/05/16 0902 09/05/16 0912 09/05/16 1142 09/05/16 1145  BP:      Pulse:      Resp:      Temp:      TempSrc:      SpO2: 92% 92% 93% 93%  Weight:      Height:        Intake/Output Summary (Last 24 hours) at 09/05/16 1148 Last data filed at 09/05/16 0657  Gross per 24 hour  Intake              100 ml  Output             2425 ml  Net            -2325 ml   Filed Weights   09/04/16 0000 09/04/16 0453 09/05/16 0654  Weight: 91 kg (200 lb 9.6 oz) 91.6 kg (202 lb) 89.1 kg (196 lb 8 oz)    Examination:  General exam: Appears calm and comfortable, no distress  Respiratory system: Congested, rhonchorous Cardiovascular system: S1 & S2 heard, rate irregular Gastrointestinal system: (+) BS, non tender abdomen  Central nervous system: No focal neurological deficits. Extremities: LE +1 edema, palpable pulses  Skin: warm and dry Psychiatry: Mood & affect appropriate.   Data Reviewed: I have personally reviewed following labs and imaging studies  CBC:  Recent Labs Lab 09/03/16 1605 09/04/16 0535 09/05/16 0544  WBC 27.1* 20.8* 23.1*  NEUTROABS 1.9  --   --   HGB 12.0* 10.1* 10.3*  HCT 35.2* 30.2* 31.0*  MCV 88.0 89.9 89.9  PLT 101* 88* 100*    Basic Metabolic Panel:  Recent Labs Lab 09/03/16 1605 09/03/16 2314 09/04/16 1010 09/04/16 2030 09/05/16 0544  NA 144  --  140 137 141  K 3.6  --  3.8 3.9 4.0  CL 108  --  107 102 106  CO2 26  --  22 26 27   GLUCOSE 117*  --  170* 112* 100*  BUN 41*  --  39* 37* 36*  CREATININE 2.53*  --  2.37* 2.44* 2.31*  CALCIUM 9.5  --  9.0 8.9 9.0  MG  --  1.8  --   --   --    GFR: Estimated Creatinine Clearance: 25.2 mL/min (by C-G formula based on SCr of 2.31 mg/dL (H)). Liver Function Tests: No results for input(s): AST, ALT, ALKPHOS, BILITOT, PROT, ALBUMIN in the last 168 hours. No results for input(s): LIPASE, AMYLASE in the last 168 hours. No results for input(s): AMMONIA in the last 168 hours. Coagulation Profile:  Recent Labs Lab 09/04/16 0141  INR 1.31   Cardiac Enzymes:  Recent Labs Lab 09/03/16 2314 09/04/16 0535 09/04/16 1010 09/04/16 1620  TROPONINI 0.79* 0.47* 0.54* 0.62*   BNP (last 3 results) No results for input(s): PROBNP in the last 8760 hours. HbA1C:  Recent Labs  09/04/16 0535  HGBA1C 6.4*   CBG:  Recent Labs Lab 09/04/16 0821 09/04/16 1228 09/04/16 1749 09/04/16 2242 09/05/16 0810  GLUCAP 163* 140* 147* 81 103*   Lipid Profile: No results for input(s): CHOL, HDL, LDLCALC, TRIG, CHOLHDL, LDLDIRECT in the last 72 hours. Thyroid Function Tests:  Recent Labs  09/03/16 1602  TSH 1.804   Anemia Panel: No results for input(s): VITAMINB12, FOLATE, FERRITIN, TIBC, IRON, RETICCTPCT in the last 72 hours. Urine analysis:    Component Value Date/Time   COLORURINE YELLOW 09/03/2016 2030   APPEARANCEUR CLOUDY (A) 09/03/2016 2030   LABSPEC 1.020 09/03/2016 2030   PHURINE 5.0 09/03/2016 2030   GLUCOSEU NEGATIVE 09/03/2016 2030   HGBUR SMALL (A) 09/03/2016 2030   [place] NEGATIVE 09/03/2016 2030   [place] 09/03/2016 2030   PROTEINUR 30 (A) 09/03/2016 2030   UROBILINOGEN 0.2 12/04/2014 2042   NITRITE NEGATIVE 09/03/2016  2030   LEUKOCYTESUR NEGATIVE 09/03/2016 2030   Sepsis Labs: @LABRCNTIP (procalcitonin:4,lacticidven:4)   Recent Results (from the past 240 hour(s))  Culture, blood (routine x 2)     Status: None (Preliminary result)   Collection Time: 09/03/16 10:29 PM  Result Value Ref Range Status   Specimen Description BLOOD RIGHT ANTECUBITAL  Final   Special Requests BOTTLES DRAWN AEROBIC AND ANAEROBIC  5 CC  Final   Culture   Final    NO GROWTH < 24 HOURS Performed at [facility]    Report Status PENDING  Incomplete  Culture, blood (routine x 2)     Status: None (Preliminary result)   Collection Time: 09/03/16 10:30 PM  Result Value Ref Range Status   Specimen Description BLOOD LEFT ANTECUBITAL  Final   Special Requests IN PEDIATRIC BOTTLE  4CC  Final   Culture   Final    NO GROWTH < 12 HOURS Performed at [facility]    Report Status PENDING  Incomplete      Radiology Studies: Dg Chest 2 View  Result Date: 09/03/2016 CLINICAL DATA:  Bilateral ankle swelling worse over the past week. History of coronary artery disease and hypertension. Former smoker. Cough and fever. EXAM: CHEST  2 VIEW COMPARISON:  11/28/2015 FINDINGS: Shallow inspiration. Mild cardiac enlargement with pulmonary vascular congestion. Slight interstitial pattern to the lung bases may represent early interstitial edema. No focal consolidation. No blunting of costophrenic angles. No pneumothorax. Calcified and tortuous aorta. Degenerative changes in the spine. IMPRESSION: Mild congestive changes in the heart and lungs with mild interstitial edema. No effusion or consolidation. Electronically Signed   By: Lucienne Capers M.D.   On: 09/03/2016 21:43   Dg Ankle Complete Left  Result Date: 09/03/2016 CLINICAL DATA:  Ankle swelling EXAM: LEFT ANKLE COMPLETE - 3+ VIEW COMPARISON:  08/03/2016 FINDINGS: Avulsion injury medial malleolus as before, probably chronic. Slight lucency in the lateral talar dome. Probable old  avulsion injury fibular malleolus. Tiny os ossific densities project over medial joint space, cannot exclude tiny loose bodies. Soft tissue swelling, moderate. Mild cortical irregularity of the anterior inferior tibia, partially related to degenerative changes. IMPRESSION: 1. Moderate soft tissue swelling. No definite acute osseous abnormality. 2. Probable old avulsion injuries of the medial and lateral malleolus. 3. Cortical irregularity of the anterior inferior tibia, likely on the basis of degenerative changes. 4. Questionable OCD lesion lateral tailor dome. Electronically Signed   By: Donavan Foil M.D.   On: 09/03/2016 21:28   Dg Ankle Complete Right  Result Date: 09/03/2016 CLINICAL DATA:  Bilateral ankle swelling, worse over the past week. Patient is on a diuretic. Fall. EXAM: RIGHT ANKLE - COMPLETE 3+ VIEW COMPARISON:  Right foot 04/10/2005 FINDINGS: Focal linear lucency and cortical irregularity at the medial malleolus may represent avulsion fracture. Medial soft tissue swelling about the right ankle. No acute displaced fractures are identified. Degenerative changes in the right ankle and intertarsal joints. No focal bone lesion or bone destruction. Vascular calcifications in the soft tissues. IMPRESSION: Focal cortical changes of the medial malleolus may indicate an avulsion fracture. Soft tissue swelling. Degenerative changes. Electronically Signed   By: Lucienne Capers M.D.   On: 09/03/2016 21:26   Dg Foot Complete Left  Result Date: 09/03/2016 CLINICAL DATA:  Bilateral ankle swelling worse over the past week. Fall. EXAM: LEFT FOOT - COMPLETE 3+ VIEW COMPARISON:  None. FINDINGS: Soft tissue swelling over the left forefoot. Mild degenerative changes in the intertarsal and first metatarsal-phalangeal joints. No acute fracture or dislocation. No focal bone lesion or bone destruction. IMPRESSION: Mild soft tissue swelling. Degenerative changes. No acute bony abnormalities. Electronically Signed    By: Lucienne Capers M.D.   On: 09/03/2016 21:29   Dg Foot Complete Right  Result Date: 09/03/2016 CLINICAL DATA:  Bilateral ankle swelling, worse over the past week. Fall. EXAM: RIGHT FOOT COMPLETE - 3+ VIEW COMPARISON:  Right foot 04/10/2005 FINDINGS: Soft tissue swelling over the dorsum of the right foot. Mild degenerative changes in the intertarsal joints. No evidence of acute fracture or dislocation. Vague focal lucency in the calcaneus is unchanged since prior study and probably represents a small bone cyst or lipoma. No destructive or expansile bone lesions. IMPRESSION: Dorsal soft tissue swelling over the right foot. Mild degenerative changes. No acute bony abnormalities. Electronically Signed   By: Lucienne Capers M.D.   On: 09/03/2016 21:32   Dg Hip Unilat With Pelvis 2-3 Views Left  Result Date: 09/03/2016 CLINICAL DATA:  Hip pain.  Injury. EXAM: DG HIP (WITH OR WITHOUT PELVIS) 2-3V LEFT COMPARISON:  None. FINDINGS: Degenerative changes lumbar spine and both hips. No acute bony abnormality. No evidence of fracture or dislocation. Aortoiliac atherosclerotic vascular calcification. Stable calcific densities noted over the left abdomen. Surgical clips in the pelvis. IMPRESSION: 1. No acute bony or joint abnormality. Degenerative changes lumbar spine and both hips. No evidence of fracture. 2. Aortoiliac atherosclerotic vascular disease . Electronically Signed   By: Marcello Moores  Register   On: 09/03/2016 16:38        Scheduled Meds: . benzonatate  100 mg Oral BID  . carvedilol  6.25 mg Oral BID WC  .  ceFAZolin (ANCEF) IV  1 g Intravenous Q12H  . furosemide  40 mg Intravenous BID  . guaiFENesin  600 mg Oral BID  . insulin aspart  0-5 Units Subcutaneous QHS  . insulin aspart  0-9 Units Subcutaneous TID WC  . ipratropium-albuterol  3 mL Nebulization QID  . levofloxacin  500 mg Oral Q48H  . levothyroxine  125 mcg Oral QAC breakfast  . montelukast  10 mg Oral QHS  . nitroGLYCERIN  0.6 mg  Transdermal Daily  . pantoprazole  40 mg Oral QAC breakfast  . potassium chloride  40 mEq Oral BID  . pravastatin  20 mg Oral QHS  . tamsulosin  0.4 mg Oral Daily   Continuous Infusions:   LOS: 2 days    Time spent: 15 minutes  Greater than 50% of the time spent on counseling and coordinating the care.   Leisa Lenz, MD [facility] Pager [phone]  If 7PM-7AM, please contact night-coverage [email or web address] Password [facility] 09/05/2016, 11:48 AM

## 2016-09-06 DIAGNOSIS — R7989 Other specified abnormal findings of blood chemistry: Secondary | ICD-10-CM

## 2016-09-06 DIAGNOSIS — R778 Other specified abnormalities of plasma proteins: Secondary | ICD-10-CM

## 2016-09-06 DIAGNOSIS — R748 Abnormal levels of other serum enzymes: Secondary | ICD-10-CM

## 2016-09-06 LAB — BASIC METABOLIC PANEL
ANION GAP: 10 (ref 5–15)
BUN: 35 mg/dL — ABNORMAL HIGH (ref 6–20)
CHLORIDE: 100 mmol/L — AB (ref 101–111)
CO2: 26 mmol/L (ref 22–32)
Calcium: 9.1 mg/dL (ref 8.9–10.3)
Creatinine, Ser: 2.42 mg/dL — ABNORMAL HIGH (ref 0.61–1.24)
GFR calc non Af Amer: 22 mL/min — ABNORMAL LOW (ref >60)
GFR, EST AFRICAN AMERICAN: 26 mL/min — AB (ref >60)
GLUCOSE: 104 mg/dL — AB (ref 65–99)
Potassium: 4.2 mmol/L (ref 3.5–5.1)
Sodium: 136 mmol/L (ref 135–145)

## 2016-09-06 LAB — GLUCOSE, CAPILLARY
GLUCOSE-CAPILLARY: 187 mg/dL — AB (ref 65–99)
Glucose-Capillary: 107 mg/dL — ABNORMAL HIGH (ref 65–99)
Glucose-Capillary: 213 mg/dL — ABNORMAL HIGH (ref 65–99)
Glucose-Capillary: 121 mg/dL — ABNORMAL HIGH (ref 65–99)

## 2016-09-06 LAB — CBC
HCT: 33.3 % — ABNORMAL LOW (ref 39.0–52.0)
HEMOGLOBIN: 11.2 g/dL — AB (ref 13.0–17.0)
MCH: 29.6 pg (ref 26.0–34.0)
MCHC: 33.6 g/dL (ref 30.0–36.0)
MCV: 88.1 fL (ref 78.0–100.0)
Platelets: 107 10*3/uL — ABNORMAL LOW (ref 150–400)
RBC: 3.78 MIL/uL — AB (ref 4.22–5.81)
RDW: 18.1 % — ABNORMAL HIGH (ref 11.5–15.5)
WBC: 29.4 10*3/uL — ABNORMAL HIGH (ref 4.0–10.5)

## 2016-09-06 LAB — PROCALCITONIN: Procalcitonin: 0.28 ng/mL

## 2016-09-06 LAB — PATHOLOGIST SMEAR REVIEW

## 2016-09-06 NOTE — Care Management Note (Signed)
Case Management Note  Patient Details  Name: Brian Mcmillan MRN: [id number] Date of Birth: Sep 04, 1925  Subjective/Objective: 82 y/o m admitted w/CHF. From home w/spouse. Referral for CHF screen.PT cons-await recc.                   Action/Plan:d/c home.   Expected Discharge Date:                  Expected Discharge Plan:  [place]  In-House Referral:     Discharge planning Services  CM Consult  Post Acute Care Choice:    Choice offered to:     DME Arranged:    DME Agency:     HH Arranged:    [place] Agency:     Status of Service:  In process, will continue to follow  If discussed at Long Length of Stay Meetings, dates discussed:    Additional Comments:  Dessa Phi, RN 09/06/2016, 11:58 AM

## 2016-09-06 NOTE — Progress Notes (Signed)
SUBJECTIVE: No chest pain or dyspnea.   Tele: sinus  BP (!) 151/61 (BP Location: Left Arm)   Pulse 64   Temp 97.8 F (36.6 C) (Oral)   Resp 18   Ht 6\' 2"  (1.88 m)   Wt 198 lb 11.2 oz (90.1 kg)   SpO2 95%   BMI 25.51 kg/m   Intake/Output Summary (Last 24 hours) at 09/06/16 1117 Last data filed at 09/06/16 0900  Gross per 24 hour  Intake              340 ml  Output             2350 ml  Net            -2010 ml    PHYSICAL EXAM General: Well developed, well nourished, in no acute distress. Alert and oriented x 3.  Psych:  Good affect, responds appropriately Neck: No JVD. No masses noted.  Lungs: Clear bilaterally with no wheezes or rhonci noted.  Heart: RRR with no murmurs noted. Abdomen: Bowel sounds are present. Soft, non-tender.  Extremities: No lower extremity edema.   LABS: Basic Metabolic Panel:  Recent Labs  09/03/16 2314  09/05/16 0544 09/06/16 0521  NA  --   < > 141 136  K  --   < > 4.0 4.2  CL  --   < > 106 100*  CO2  --   < > 27 26  GLUCOSE  --   < > 100* 104*  BUN  --   < > 36* 35*  CREATININE  --   < > 2.31* 2.42*  CALCIUM  --   < > 9.0 9.1  MG 1.8  --   --   --   < > = values in this interval not displayed. CBC:  Recent Labs  09/03/16 1605  09/05/16 0544 09/06/16 0521  WBC 27.1*  < > 23.1* 29.4*  NEUTROABS 1.9  --   --   --   HGB 12.0*  < > 10.3* 11.2*  HCT 35.2*  < > 31.0* 33.3*  MCV 88.0  < > 89.9 88.1  PLT 101*  < > 100* 107*  < > = values in this interval not displayed. Cardiac Enzymes:  Recent Labs  09/04/16 0535 09/04/16 1010 09/04/16 1620  TROPONINI 0.47* 0.54* 0.62*   Current Meds: . benzonatate  100 mg Oral BID  . carvedilol  6.25 mg Oral BID WC  .  ceFAZolin (ANCEF) IV  1 g Intravenous Q12H  . furosemide  40 mg Intravenous BID  . guaiFENesin  600 mg Oral BID  . insulin aspart  0-5 Units Subcutaneous QHS  . insulin aspart  0-9 Units Subcutaneous TID WC  . ipratropium-albuterol  3 mL Nebulization QID  .  levofloxacin  500 mg Oral Q48H  . levothyroxine  125 mcg Oral QAC breakfast  . montelukast  10 mg Oral QHS  . nitroGLYCERIN  0.6 mg Transdermal Daily  . pantoprazole  40 mg Oral QAC breakfast  . potassium chloride  40 mEq Oral BID  . pravastatin  20 mg Oral QHS  . tamsulosin  0.4 mg Oral Daily     ASSESSMENT AND PLAN: 82 yo man with CAD and mild ischemic cardiomyopathy presents with new onset atrial fibrillation (resolved) and CHF exacerbation, as well as worsened renal failure and severe bilateral foot pain, erythema and elevated WBC. History of recurrent severe epistaxis has prevented use of ASA or clopidogrel.  1.  Atrial fibrillation: Newly recognized this admission. He was not aware of this. He cannot take ASA due to severe epistaxis. CHADS VASC score 5. He refuses to consider anti-coagulation. He could consider the WATCHMAN device if he would be willing to use coumadin for one month.  -continue beta blocker for now.  -Further discussion after discharge with his primary cardiologist  2. Acute on chronic systolic and diastolic CHF: [id number] by echo 09/04/16. He has diuresed 5.4 liters since admission. Volume status is ok today. Would change to po Lasix today. Continue beta blocker. No ARB with renal insufficiency.   3. Elevated troponin: Likely due to elevated HR on admission. No angina to suggest ACS. EKG and echo suggestive of prior inferior MI. Will not plan ischemic evaluation.     Brian Mcmillan  11/27/201711:17 AM

## 2016-09-06 NOTE — Progress Notes (Signed)
Patient ID: Brian Mcmillan, male   DOB: 05-25-1925, 82 y.o.   MRN: [id number]  PROGRESS NOTE    Brian Mcmillan  [id number] DOB: 1925/04/21 DOA: 09/03/2016  PCP: [name], MD   Brief Narrative:  82 y.o. male with a past medical history significant for ischemic heart disease and CHF EF 45%, HTN, hypothyroidism, renal CA s/p ablation, hx of TIA, MGUS and NIDDM who presented to [facility] ED with bilateral ankle pain, swelling for past 203 weeks prior to this admission. Pt also reproted ongoing cough intermittently productive of clear sputum, fevers.  In ED, pt afebrile, HR in 90's, irregular. Blood work showed Cr 2.53 (baseline 1.4-1.7), WBC count 27.1, platelets 101, lactic acid 1.5. The 12 lead EKG showed new Afib. BNP was 670 and troponin 0.70. Pt seen by cardio in consultation.   Assessment & Plan:   Principal Problem:   Acute on chronic systolic and diastolic CHF (congestive heart failure) ([place]) / Acute respiratory failure with hypoxia - CXR on admission with mild congestive changes - 2-D echo on this admission showed ejection fraction [id number], grade 1 diastolic dysfunction - BNP on admission 670 - On lasix 40 mg IV BID but LE swelling almost gone and cardio has switched to PO lasix today - Weight since 11/25 91.6 kg --> 90.1 kg - Continue daily weight and strict intake and output - Appreciate cardiology following and their recommendations - Replete electrolytes as needed  Active Problems:   Lobar pneumonia, unspecified organism - Started empiric Levaquin 09/05/2016  - Leukocytosis initially improving but again up to 29; ?from h/o MGUS but about a year ago his WBC count was WNL - Will continue to monitor CBC daily and if further upward trend will consult hematology - Blood cx so far show no growth     New onset atrial fibrillation  - CHADS vasc score 5 - Pt reported he cannot take aspirin due to epistaxis - Per cardio, may be a candidate for a watchman device if he can  take warfarin for 1 month. At this point, he declines initiation of anticoagulants - Rate controlled with carvedilol     Elevate troponin level - Demand ischemia from acute CHF, a fib  - EKG with chronic repolarization abnormalities - Not a candidate for invasive evaluation per cardio    Right foot cellulitis / Leukocytosis - Continue cefazolin - Soft tissue swelling on x rays     CKD stage 3 - Cr at baseline 1.77 - Cr on this admission 2.53, likely due to lasix - Creatinine within 2.4 range    Thrombocytopenia - Due to history of MGUS - Platelets stable at 107    Anemia of chronic disease - Due to CKD and MGUS - Hgb stable at 10.3, 11.2     DVT prophylaxis: SCD's bilaterally  Code Status: full code  Family Communication: wife at the bedside this am Disposition Plan: home or SNF once cleared by cardio    Consultants:   Cardio  PT  Procedures:   2 D ECHO - ejection fraction [id number], grade 1 diastolic dysfunction  Antimicrobials:   Cefazolin 09/03/2016 -->   Levaquin 09/05/2016 -->    Subjective: Feels better.   Objective: Vitals:   09/05/16 2106 09/06/16 0513 09/06/16 0747 09/06/16 0754  BP: (!) 119/50 (!) 163/70 (!) 151/61   Pulse: 67 64 64   Resp: 16 18    Temp: 98.2 F (36.8 C) 97.8 F (36.6 C)    TempSrc: Oral Oral  SpO2: 97% 97%  95%  Weight:  90.1 kg (198 lb 11.2 oz)    Height:        Intake/Output Summary (Last 24 hours) at 09/06/16 1159 Last data filed at 09/06/16 0900  Gross per 24 hour  Intake              340 ml  Output             2350 ml  Net            -2010 ml   Filed Weights   09/04/16 0453 09/05/16 0654 09/06/16 0513  Weight: 91.6 kg (202 lb) 89.1 kg (196 lb 8 oz) 90.1 kg (198 lb 11.2 oz)    Examination:  General exam: no distress  Respiratory system: Congested, no wheezing  Cardiovascular system: S1 & S2 heard, rate controlled  Gastrointestinal system: (+) BS, non tender abdomen  Central nervous system:  Nonfocal Extremities: edema almost all resolved in LE, palpable pulses  Skin: warm and dry, no lesions or ulcers  Psychiatry: Mood & affect appropriate.   Data Reviewed: I have personally reviewed following labs and imaging studies  CBC:  Recent Labs Lab 09/03/16 1605 09/04/16 0535 09/05/16 0544 09/06/16 0521  WBC 27.1* 20.8* 23.1* 29.4*  NEUTROABS 1.9  --   --   --   HGB 12.0* 10.1* 10.3* 11.2*  HCT 35.2* 30.2* 31.0* 33.3*  MCV 88.0 89.9 89.9 88.1  PLT 101* 88* 100* [id number]*   Basic Metabolic Panel:  Recent Labs Lab 09/03/16 1605 09/03/16 2314 09/04/16 1010 09/04/16 2030 09/05/16 0544 09/06/16 0521  NA 144  --  140 137 141 136  K 3.6  --  3.8 3.9 4.0 4.2  CL 108  --  107 102 106 100*  CO2 26  --  22 26 27 26   GLUCOSE 117*  --  170* 112* 100* 104*  BUN 41*  --  39* 37* 36* 35*  CREATININE 2.53*  --  2.37* 2.44* 2.31* 2.42*  CALCIUM 9.5  --  9.0 8.9 9.0 9.1  MG  --  1.8  --   --   --   --    GFR: Estimated Creatinine Clearance: 24.1 mL/min (by C-G formula based on SCr of 2.42 mg/dL (H)). Liver Function Tests: No results for input(s): AST, ALT, ALKPHOS, BILITOT, PROT, ALBUMIN in the last 168 hours. No results for input(s): LIPASE, AMYLASE in the last 168 hours. No results for input(s): AMMONIA in the last 168 hours. Coagulation Profile:  Recent Labs Lab 09/04/16 0141  INR 1.31   Cardiac Enzymes:  Recent Labs Lab 09/03/16 2314 09/04/16 0535 09/04/16 1010 09/04/16 1620  TROPONINI 0.79* 0.47* 0.54* 0.62*   BNP (last 3 results) No results for input(s): PROBNP in the last 8760 hours. HbA1C:  Recent Labs  09/04/16 0535  HGBA1C 6.4*   CBG:  Recent Labs Lab 09/05/16 0810 09/05/16 1156 09/05/16 1705 09/05/16 2104 09/06/16 0737  GLUCAP 103* 130* 93 129* 107*   Lipid Profile: No results for input(s): CHOL, HDL, LDLCALC, TRIG, CHOLHDL, LDLDIRECT in the last 72 hours. Thyroid Function Tests:  Recent Labs  09/03/16 1602  TSH 1.804   Anemia  Panel: No results for input(s): VITAMINB12, FOLATE, FERRITIN, TIBC, IRON, RETICCTPCT in the last 72 hours. Urine analysis:    Component Value Date/Time   COLORURINE YELLOW 09/03/2016 2030   APPEARANCEUR CLOUDY (A) 09/03/2016 2030   LABSPEC 1.020 09/03/2016 2030   PHURINE 5.0 09/03/2016 2030   GLUCOSEU NEGATIVE  09/03/2016 2030   HGBUR SMALL (A) 09/03/2016 2030   [place] NEGATIVE 09/03/2016 2030   [place] NEGATIVE 09/03/2016 2030   PROTEINUR 30 (A) 09/03/2016 2030   UROBILINOGEN 0.2 12/04/2014 2042   NITRITE NEGATIVE 09/03/2016 2030   LEUKOCYTESUR NEGATIVE 09/03/2016 2030   Sepsis Labs: @LABRCNTIP (procalcitonin:4,lacticidven:4)   Recent Results (from the past 240 hour(s))  Culture, blood (routine x 2)     Status: None (Preliminary result)   Collection Time: 09/03/16 10:29 PM  Result Value Ref Range Status   Specimen Description BLOOD RIGHT ANTECUBITAL  Final   Special Requests BOTTLES DRAWN AEROBIC AND ANAEROBIC 5 CC  Final   Culture   Final    NO GROWTH 3 DAYS Performed at [facility]    Report Status PENDING  Incomplete  Culture, blood (routine x 2)     Status: None (Preliminary result)   Collection Time: 09/03/16 10:30 PM  Result Value Ref Range Status   Specimen Description BLOOD LEFT ANTECUBITAL  Final   Special Requests IN PEDIATRIC BOTTLE  4CC  Final   Culture   Final    NO GROWTH 2 DAYS Performed at [facility]    Report Status PENDING  Incomplete      Radiology Studies: Dg Chest 2 View  Result Date: 09/03/2016 CLINICAL DATA:  Bilateral ankle swelling worse over the past week. History of coronary artery disease and hypertension. Former smoker. Cough and fever. EXAM: CHEST  2 VIEW COMPARISON:  11/28/2015 FINDINGS: Shallow inspiration. Mild cardiac enlargement with pulmonary vascular congestion. Slight interstitial pattern to the lung bases may represent early interstitial edema. No focal consolidation. No blunting of costophrenic angles. No  pneumothorax. Calcified and tortuous aorta. Degenerative changes in the spine. IMPRESSION: Mild congestive changes in the heart and lungs with mild interstitial edema. No effusion or consolidation. Electronically Signed   By: Lucienne Capers M.D.   On: 09/03/2016 21:43   Dg Ankle Complete Left  Result Date: 09/03/2016 CLINICAL DATA:  Ankle swelling EXAM: LEFT ANKLE COMPLETE - 3+ VIEW COMPARISON:  08/03/2016 FINDINGS: Avulsion injury medial malleolus as before, probably chronic. Slight lucency in the lateral talar dome. Probable old avulsion injury fibular malleolus. Tiny os ossific densities project over medial joint space, cannot exclude tiny loose bodies. Soft tissue swelling, moderate. Mild cortical irregularity of the anterior inferior tibia, partially related to degenerative changes. IMPRESSION: 1. Moderate soft tissue swelling. No definite acute osseous abnormality. 2. Probable old avulsion injuries of the medial and lateral malleolus. 3. Cortical irregularity of the anterior inferior tibia, likely on the basis of degenerative changes. 4. Questionable OCD lesion lateral tailor dome. Electronically Signed   By: Donavan Foil M.D.   On: 09/03/2016 21:28   Dg Ankle Complete Right  Result Date: 09/03/2016 CLINICAL DATA:  Bilateral ankle swelling, worse over the past week. Patient is on a diuretic. Fall. EXAM: RIGHT ANKLE - COMPLETE 3+ VIEW COMPARISON:  Right foot 04/10/2005 FINDINGS: Focal linear lucency and cortical irregularity at the medial malleolus may represent avulsion fracture. Medial soft tissue swelling about the right ankle. No acute displaced fractures are identified. Degenerative changes in the right ankle and intertarsal joints. No focal bone lesion or bone destruction. Vascular calcifications in the soft tissues. IMPRESSION: Focal cortical changes of the medial malleolus may indicate an avulsion fracture. Soft tissue swelling. Degenerative changes. Electronically Signed   By: Lucienne Capers M.D.   On: 09/03/2016 21:26   Dg Foot Complete Left  Result Date: 09/03/2016 CLINICAL DATA:  Bilateral  ankle swelling worse over the past week. Fall. EXAM: LEFT FOOT - COMPLETE 3+ VIEW COMPARISON:  None. FINDINGS: Soft tissue swelling over the left forefoot. Mild degenerative changes in the intertarsal and first metatarsal-phalangeal joints. No acute fracture or dislocation. No focal bone lesion or bone destruction. IMPRESSION: Mild soft tissue swelling. Degenerative changes. No acute bony abnormalities. Electronically Signed   By: Lucienne Capers M.D.   On: 09/03/2016 21:29   Dg Foot Complete Right  Result Date: 09/03/2016 CLINICAL DATA:  Bilateral ankle swelling, worse over the past week. Fall. EXAM: RIGHT FOOT COMPLETE - 3+ VIEW COMPARISON:  Right foot 04/10/2005 FINDINGS: Soft tissue swelling over the dorsum of the right foot. Mild degenerative changes in the intertarsal joints. No evidence of acute fracture or dislocation. Vague focal lucency in the calcaneus is unchanged since prior study and probably represents a small bone cyst or lipoma. No destructive or expansile bone lesions. IMPRESSION: Dorsal soft tissue swelling over the right foot. Mild degenerative changes. No acute bony abnormalities. Electronically Signed   By: Lucienne Capers M.D.   On: 09/03/2016 21:32   Dg Hip Unilat With Pelvis 2-3 Views Left  Result Date: 09/03/2016 CLINICAL DATA:  Hip pain.  Injury. EXAM: DG HIP (WITH OR WITHOUT PELVIS) 2-3V LEFT COMPARISON:  None. FINDINGS: Degenerative changes lumbar spine and both hips. No acute bony abnormality. No evidence of fracture or dislocation. Aortoiliac atherosclerotic vascular calcification. Stable calcific densities noted over the left abdomen. Surgical clips in the pelvis. IMPRESSION: 1. No acute bony or joint abnormality. Degenerative changes lumbar spine and both hips. No evidence of fracture. 2. Aortoiliac atherosclerotic vascular disease . Electronically Signed    By: Marcello Moores  Register   On: 09/03/2016 16:38        Scheduled Meds: . benzonatate  100 mg Oral BID  . carvedilol  6.25 mg Oral BID WC  .  ceFAZolin (ANCEF) IV  1 g Intravenous Q12H  . furosemide  40 mg Intravenous BID  . guaiFENesin  600 mg Oral BID  . insulin aspart  0-5 Units Subcutaneous QHS  . insulin aspart  0-9 Units Subcutaneous TID WC  . ipratropium-albuterol  3 mL Nebulization QID  . levofloxacin  500 mg Oral Q48H  . levothyroxine  125 mcg Oral QAC breakfast  . montelukast  10 mg Oral QHS  . nitroGLYCERIN  0.6 mg Transdermal Daily  . pantoprazole  40 mg Oral QAC breakfast  . potassium chloride  40 mEq Oral BID  . pravastatin  20 mg Oral QHS  . tamsulosin  0.4 mg Oral Daily   Continuous Infusions:   LOS: 3 days    Time spent: 25 minutes  Greater than 50% of the time spent on counseling and coordinating the care.   Leisa Lenz, MD [facility] Pager [phone]  If 7PM-7AM, please contact night-coverage [email or web address] Password [email or web address] 09/06/2016, 11:59 AM

## 2016-09-06 NOTE — Care Management Note (Signed)
Case Management Note  Patient Details  Name: LOWE MCBRATNEY MRN: [id number] Date of Birth: 09/24/1925  Subjective/Objective:  Referral for med-NOAC-if d/c on xarelto or eliquis will have a 30day free discount coupon once confirmed d/c med.                  Action/Plan:d/c plan home.   Expected Discharge Date:                  Expected Discharge Plan:  [place]  In-House Referral:     Discharge planning Services  CM Consult  Post Acute Care Choice:    Choice offered to:     DME Arranged:    DME Agency:     HH Arranged:    [place] Agency:     Status of Service:  In process, will continue to follow  If discussed at Long Length of Stay Meetings, dates discussed:    Additional Comments:  Dessa Phi, RN 09/06/2016, 12:08 PM

## 2016-09-07 LAB — BASIC METABOLIC PANEL
ANION GAP: 12 (ref 5–15)
BUN: 42 mg/dL — ABNORMAL HIGH (ref 6–20)
CALCIUM: 9.5 mg/dL (ref 8.9–10.3)
CO2: 24 mmol/L (ref 22–32)
Chloride: 99 mmol/L — ABNORMAL LOW (ref 101–111)
Creatinine, Ser: 3.22 mg/dL — ABNORMAL HIGH (ref 0.61–1.24)
GFR, EST AFRICAN AMERICAN: 18 mL/min — AB (ref >60)
GFR, EST NON AFRICAN AMERICAN: 16 mL/min — AB (ref >60)
Glucose, Bld: 125 mg/dL — ABNORMAL HIGH (ref 65–99)
Potassium: 4.7 mmol/L (ref 3.5–5.1)
SODIUM: 135 mmol/L (ref 135–145)

## 2016-09-07 LAB — CBC
HCT: 34.5 % — ABNORMAL LOW (ref 39.0–52.0)
Hemoglobin: 11.4 g/dL — ABNORMAL LOW (ref 13.0–17.0)
MCH: 29.7 pg (ref 26.0–34.0)
MCHC: 33 g/dL (ref 30.0–36.0)
MCV: 89.8 fL (ref 78.0–100.0)
PLATELETS: 127 10*3/uL — AB (ref 150–400)
RBC: 3.84 MIL/uL — AB (ref 4.22–5.81)
RDW: 18.3 % — AB (ref 11.5–15.5)
WBC: 37.2 10*3/uL — AB (ref 4.0–10.5)

## 2016-09-07 LAB — GLUCOSE, CAPILLARY
GLUCOSE-CAPILLARY: 121 mg/dL — AB (ref 65–99)
GLUCOSE-CAPILLARY: 166 mg/dL — AB (ref 65–99)
GLUCOSE-CAPILLARY: 98 mg/dL (ref 65–99)
Glucose-Capillary: 161 mg/dL — ABNORMAL HIGH (ref 65–99)

## 2016-09-07 MED ORDER — FUROSEMIDE 40 MG PO TABS: 40.0000 mg | ORAL_TABLET | Freq: Every day | ORAL | Status: DC

## 2016-09-07 MED ORDER — IPRATROPIUM-ALBUTEROL 0.5-2.5 (3) MG/3ML IN SOLN
3.0000 mL | Freq: Three times a day (TID) | RESPIRATORY_TRACT | Status: DC
  Administered 2016-09-07 – 2016-09-08 (×3): 3 mL via RESPIRATORY_TRACT
  Filled 2016-09-07 (×3): qty 3

## 2016-09-07 NOTE — Progress Notes (Signed)
Patient Name: Brian Mcmillan Date of Encounter: 09/07/2016  Primary Cardiologist: Dr. Alethia [facility] Problem List     Principal Problem:   Acute on chronic systolic CHF (congestive heart failure) (HCC) Active Problems:   Hypothyroidism, acquired   Essential hypertension   Coronary artery disease due to lipid rich plaque   Acute on chronic kidney failure (HCC)   Thrombocytopenia (HCC)   Cellulitis   Acute on chronic combined systolic and diastolic CHF, NYHA class 2 (HCC)   Paroxysmal atrial fibrillation (HCC)   Leukocytosis   Lobar pneumonia, unspecified organism ([place])   Troponin level elevated    Subjective   Celebrated his wedding anniversary yesterday (35 years). Denies any chest discomfort or palpitations. Worked with PT this morning. Has significant erythema along left ankle.   Inpatient Medications    Scheduled Meds: . benzonatate  100 mg Oral BID  . carvedilol  6.25 mg Oral BID WC  . furosemide  40 mg Intravenous BID  . guaiFENesin  600 mg Oral BID  . insulin aspart  0-5 Units Subcutaneous QHS  . insulin aspart  0-9 Units Subcutaneous TID WC  . ipratropium-albuterol  3 mL Nebulization TID  . levofloxacin  500 mg Oral Q48H  . levothyroxine  125 mcg Oral QAC breakfast  . montelukast  10 mg Oral QHS  . nitroGLYCERIN  0.6 mg Transdermal Daily  . pantoprazole  40 mg Oral QAC breakfast  . potassium chloride  40 mEq Oral BID  . pravastatin  20 mg Oral QHS  . tamsulosin  0.4 mg Oral Daily   Continuous Infusions:  PRN Meds: acetaminophen **OR** acetaminophen, ipratropium-albuterol, ondansetron **OR** ondansetron (ZOFRAN) IV, traMADol   Vital Signs    Vitals:   09/06/16 2052 09/07/16 0500 09/07/16 0826 09/07/16 0920  BP:  136/62 (!) 107/57   Pulse:  64 70   Resp:  17    Temp:  97.7 F (36.5 C)    TempSrc:  Oral    SpO2: 94% 96%  94%  Weight:  199 lb 4.7 oz (90.4 kg)    Height:        Intake/Output Summary (Last 24 hours) at 09/07/16  1028 Last data filed at 09/07/16 0250  Gross per 24 hour  Intake              460 ml  Output             1500 ml  Net            -1040 ml   Filed Weights   09/05/16 0654 09/06/16 0513 09/07/16 0500  Weight: 196 lb 8 oz (89.1 kg) 198 lb 11.2 oz (90.1 kg) 199 lb 4.7 oz (90.4 kg)    Physical Exam   GEN: Well nourished, well developed, Caucasian male appearing in no acute distress.  HEENT: Grossly normal.  Neck: Supple, no JVD, carotid bruits, or masses. Cardiac: RRR, no murmurs, rubs, or gallops. No clubbing, cyanosis, edema.  Radials/DP/PT 2+ and equal bilaterally.  Respiratory:  Respirations regular and unlabored, clear to auscultation bilaterally. GI: Soft, nontender, nondistended, BS + x 4. MS: no deformity or atrophy. Skin: warm and dry, no rash. Significant erythema along left ankle. Warm to touch. Neuro:  Strength and sensation are intact. Psych: AAOx3.  Normal affect.  Labs    CBC  Recent Labs  09/06/16 0521 09/07/16 0556  WBC 29.4* 37.2*  HGB 11.2* 11.4*  HCT 33.3* 34.5*  MCV 88.1 89.8  PLT 107* 127*  Basic Metabolic Panel  Recent Labs  09/06/16 0521 09/07/16 0556  NA 136 135  K 4.2 4.7  CL 100* 99*  CO2 26 24  GLUCOSE 104* 125*  BUN 35* 42*  CREATININE 2.42* 3.22*  CALCIUM 9.1 9.5   Liver Function Tests No results for input(s): AST, ALT, ALKPHOS, BILITOT, PROT, ALBUMIN in the last 72 hours. No results for input(s): LIPASE, AMYLASE in the last 72 hours. Cardiac Enzymes  Recent Labs  09/04/16 1620  TROPONINI 0.62*   BNP Invalid input(s): POCBNP D-Dimer No results for input(s): DDIMER in the last 72 hours. Hemoglobin A1C No results for input(s): HGBA1C in the last 72 hours. Fasting Lipid Panel No results for input(s): CHOL, HDL, LDLCALC, TRIG, CHOLHDL, LDLDIRECT in the last 72 hours. Thyroid Function Tests No results for input(s): TSH, T4TOTAL, T3FREE, THYROIDAB in the last 72 hours.  Invalid input(s): FREET3  Telemetry    NSR, HR in  60-70's with occasional PVC's. 4 beats NSVT with an episode of Ventricular Trigeminy noted. - Personally Reviewed  ECG   No new tracings.   Radiology    No results found.  Cardiac Studies   Echocardiogram: 09/04/2016 Study Conclusions  - Left ventricle: The cavity size was at the upper limits of   normal. Wall thickness was normal. The estimated ejection   fraction was in the range of 45% to 50%. Hypokinesis of the   basalinferolateral and inferior myocardium. Features are   consistent with a pseudonormal left ventricular filling pattern,   with concomitant abnormal relaxation and increased filling   pressure (grade 2 diastolic dysfunction). Acoustic contrast   opacification revealed no evidence ofthrombus. - Mitral valve: There was mild regurgitation. - Left atrium: The atrium was mildly dilated.  Patient Profile     82 yo male w/ PMH of CAD, chronic combined systolic and diastolic CHF, HTN, HLD, and history of severe epistaxis with antiplatelet/anticoagulation admitted with CHF exacerbation, PNA, and new onset atrial fibrillation.   Assessment & Plan   1. New Onset Atrial fibrillation - newly recognized this admission. Asymptomatic at that time. Has since converted to NSR. Reports he cannot take ASA due to severe epistaxis.  - This patients CHA2DS2-VASc Score and unadjusted Ischemic Stroke Rate (% per year) is equal to 7.2 % stroke rate/year from a score of 5 (CHF, HTN, Vascular, Age (2)). He refuses to consider anti-coagulation with his history of severe epistaxis. He could consider the WATCHMAN device if he would be willing to use coumadin for one month. Will need to discuss further with his Primary Cardiologist.  - continue Coreg 6.25mg  BID. He does have occasional ectopy on telemetry with 4 beats NSVT overnight and an episode of ventricular trigeminy. K+ 4.7. TSH WNL. Recheck Mg. Can consider titration to 9.375mg  BID.    2. Acute on chronic systolic and diastolic CHF -  LVEF [id number] by echo 09/04/16. He has diuresed -6.3L this admission. With stark increase in creatinine (2.42 --> 3.22), hold Lasix for the rest of the day (AM dose already given). Anticipate switching to PO tomorrow if creatinine improved. On PO Lasix 20mg  daily PTA. Continue BB. No ACE-I/ARB with renal insufficiency.   3. Elevated troponin - Likely due to elevated HR on admission. No angina to suggest ACS. EKG and echo suggestive of prior inferior MI. Will not plan ischemic evaluation this admission.   4. HTN - BP well-controlled.   5. Lobar PNA/ Cellulitis - WBC trending upwards to 37.2 on 09/07/2016. Significant erythema along left ankle.  -  per admitting team  6. Acute on Chronic Stage 4 CKD - creatinine 2.53 on admission, up to 3.22 this AM.  - repeat BMET in AM.   Signed, Erma Heritage, [place]  09/07/2016, 10:28 AM   I have personally seen and examined this patient with Bernerd Pho, PA-C. I agree with the assessment and plan as outlined above. He is in sinus today. Continue Coreg. He refuses anti-coagulation. Volume status is much better but renal function has worsened. Lasix on hold today.   Lauree Chandler 09/07/2016 11:15 AM

## 2016-09-07 NOTE — Progress Notes (Addendum)
Patient ID: Brian Mcmillan, male   DOB: [birth year], 82 y.o.   MRN: [id number]  PROGRESS NOTE    Brian Mcmillan  [id number] DOB: 06/19/1925 DOA: 09/03/2016  PCP: [name], MD   Brief Narrative:  82 y.o. male with a past medical history significant for ischemic heart disease and CHF EF 45%, HTN, hypothyroidism, renal CA s/p ablation, hx of TIA, MGUS and NIDDM who presented to [facility] ED with bilateral ankle pain, swelling for past 203 weeks prior to this admission. Pt also reproted ongoing cough intermittently productive of clear sputum, fevers.  In ED, pt afebrile, HR in 90's, irregular. Blood work showed Cr 2.53 (baseline 1.4-1.7), WBC count 27.1, platelets 101, lactic acid 1.5. The 12 lead EKG showed new Afib. BNP was 670 and troponin 0.70. Pt seen by cardio in consultation.   Assessment & Plan:   Principal Problem:   Acute on chronic systolic and diastolic CHF (congestive heart failure) (HCC) / Acute respiratory failure with hypoxia - CXR on admission with mild congestive changes - 2-D echo on this admission showed ejection fraction [id number], grade 1 diastolic dysfunction - BNP on admission 670 - On lasix 40 mg IV BID and cardio changed it to PO regimen but this am Cr is getting worse so pt not on lasix as of today  - Weight since 11/25 91.6 kg --> 90.1 kg --> 90.4 kg  - Continue daily weight and strict intake and output - Appreciate cardiology following and their recommendations - Replete electrolytes as needed  Active Problems:   Lobar pneumonia, unspecified organism - Started empiric Levaquin 09/05/2016  - Blood cx so far showed no growth     Leukocytosis  - Leukocytosis initially improving but again up to 29, 37 ; ?from h/o MGUS but about a year ago his WBC count was WNL - His oncologist is Dr. Everardo All of [facility]. I spoke with Dr. Jana Hakim this am about this pt and worsening white blood cell count, he recommended for the beginning peripheral smear - Per  patient's oncology back in June 2017 elevated white blood cell count wasn't really an issue rather his white blood cell count was low and patient was being worked up for B-12 and folate deficiency.    MGUS - Per pt oncology in 03/2016 compared to 2015 nothing has truly changed. M spike is 0.8 g/dL. He still has a mild IgM elevation and he has both an IgG monoclonal protein with kappa light chains and a small IgM monoclonal protein with kappa light chains which is not new or different.    New onset atrial fibrillation  - CHADS vasc score 5 - Pt reported he cannot take aspirin due to epistaxis - Per cardio, may be a candidate for a watchman device if he can take warfarin for 1 month. At this point, he declines initiation of anticoagulants - Rate controlled with carvedilol     Elevate troponin level - Demand ischemia from acute CHF, a fib  - EKG with chronic repolarization abnormalities - Not a candidate for invasive evaluation per cardio    Right foot cellulitis / Leukocytosis - Stop cefazolin and continue Levaquin which will cover for both cellulitis and pneumonia  - Soft tissue swelling on x rays     CKD stage 3 - Cr at baseline 1.77 - Cr on this admission 2.53, likely due to lasix and continues to trend up     Thrombocytopenia - Due to history of MGUS - Platelets stable at 107,  127    Anemia of chronic disease - Due to CKD and MGUS - Hgb stable at 10.3, 11.2, 11.4     DVT prophylaxis: SCD's bilaterally  Code Status: full code  Family Communication: wife at the bedside this am Disposition Plan: Patient is not yet stable for discharge due to ongoing elevated white blood cell count   Consultants:   Cardio  PT  Procedures:   2 D ECHO - ejection fraction [id number], grade 1 diastolic dysfunction  Antimicrobials:   Cefazolin 09/03/2016 --> 09/07/2016  Levaquin 09/05/2016 -->    Subjective: Feels better.   Objective: Vitals:   09/06/16 2052 09/07/16 0500 09/07/16 0826  09/07/16 0920  BP:  136/62 (!) 107/57   Pulse:  64 70   Resp:  17    Temp:  97.7 F (36.5 C)    TempSrc:  Oral    SpO2: 94% 96%  94%  Weight:  90.4 kg (199 lb 4.7 oz)    Height:        Intake/Output Summary (Last 24 hours) at 09/07/16 1050 Last data filed at 09/07/16 0250  Gross per 24 hour  Intake              460 ml  Output             1500 ml  Net            -1040 ml   Filed Weights   09/05/16 0654 09/06/16 0513 09/07/16 0500  Weight: 89.1 kg (196 lb 8 oz) 90.1 kg (198 lb 11.2 oz) 90.4 kg (199 lb 4.7 oz)    Examination:  General exam: no distress, Calm and comfortable Respiratory system: Congested, no wheezing  Cardiovascular system: S1 & S2 heard, rate controlled  Gastrointestinal system: (+) BS, non tender abdomen, no distended  Central nervous system: No focal deficits  Extremities: edema almost all resolved in LE, palpable pulses  Skin: warm and dry Psychiatry: Normal mood and behavior   Data Reviewed: I have personally reviewed following labs and imaging studies  CBC:  Recent Labs Lab 09/03/16 1605 09/04/16 0535 09/05/16 0544 09/06/16 0521 09/07/16 0556  WBC 27.1* 20.8* 23.1* 29.4* 37.2*  NEUTROABS 1.9  --   --   --   --   HGB 12.0* 10.1* 10.3* 11.2* 11.4*  HCT 35.2* 30.2* 31.0* 33.3* 34.5*  MCV 88.0 89.9 89.9 88.1 89.8  PLT 101* 88* 100* 107* [id number]*   Basic Metabolic Panel:  Recent Labs Lab 09/03/16 2314 09/04/16 1010 09/04/16 2030 09/05/16 0544 09/06/16 0521 09/07/16 0556  NA  --  140 137 141 136 135  K  --  3.8 3.9 4.0 4.2 4.7  CL  --  107 102 106 100* 99*  CO2  --  22 26 27 26 24   GLUCOSE  --  170* 112* 100* 104* 125*  BUN  --  39* 37* 36* 35* 42*  CREATININE  --  2.37* 2.44* 2.31* 2.42* 3.22*  CALCIUM  --  9.0 8.9 9.0 9.1 9.5  MG 1.8  --   --   --   --   --    GFR: Estimated Creatinine Clearance: 18.1 mL/min (by C-G formula based on SCr of 3.22 mg/dL (H)). Liver Function Tests: No results for input(s): AST, ALT, ALKPHOS, BILITOT,  PROT, ALBUMIN in the last 168 hours. No results for input(s): LIPASE, AMYLASE in the last 168 hours. No results for input(s): AMMONIA in the last 168 hours. Coagulation Profile:  Recent Labs Lab  09/04/16 0141  INR 1.31   Cardiac Enzymes:  Recent Labs Lab 09/03/16 2314 09/04/16 0535 09/04/16 1010 09/04/16 1620  TROPONINI 0.79* 0.47* 0.54* 0.62*   BNP (last 3 results) No results for input(s): PROBNP in the last 8760 hours. HbA1C: No results for input(s): HGBA1C in the last 72 hours. CBG:  Recent Labs Lab 09/06/16 0737 09/06/16 1207 09/06/16 1641 09/06/16 2108 09/07/16 0751  GLUCAP 107* 213* 121* 187* 121*   Lipid Profile: No results for input(s): CHOL, HDL, LDLCALC, TRIG, CHOLHDL, LDLDIRECT in the last 72 hours. Thyroid Function Tests: No results for input(s): TSH, T4TOTAL, FREET4, T3FREE, THYROIDAB in the last 72 hours. Anemia Panel: No results for input(s): VITAMINB12, FOLATE, FERRITIN, TIBC, IRON, RETICCTPCT in the last 72 hours. Urine analysis:    Component Value Date/Time   COLORURINE YELLOW 09/03/2016 2030   APPEARANCEUR CLOUDY (A) 09/03/2016 2030   LABSPEC 1.020 09/03/2016 2030   PHURINE 5.0 09/03/2016 2030   GLUCOSEU NEGATIVE 09/03/2016 2030   HGBUR SMALL (A) 09/03/2016 2030   [place] NEGATIVE 09/03/2016 2030   [place] 09/03/2016 2030   PROTEINUR 30 (A) 09/03/2016 2030   UROBILINOGEN 0.2 12/04/2014 2042   NITRITE NEGATIVE 09/03/2016 2030   LEUKOCYTESUR NEGATIVE 09/03/2016 2030   Sepsis Labs: @LABRCNTIP (procalcitonin:4,lacticidven:4)   Recent Results (from the past 240 hour(s))  Culture, blood (routine x 2)     Status: None (Preliminary result)   Collection Time: 09/03/16 10:29 PM  Result Value Ref Range Status   Specimen Description BLOOD RIGHT ANTECUBITAL  Final   Special Requests BOTTLES DRAWN AEROBIC AND ANAEROBIC 5 CC  Final   Culture   Final    NO GROWTH 3 DAYS Performed at [facility]    Report Status PENDING   Incomplete  Culture, blood (routine x 2)     Status: None (Preliminary result)   Collection Time: 09/03/16 10:30 PM  Result Value Ref Range Status   Specimen Description BLOOD LEFT ANTECUBITAL  Final   Special Requests IN PEDIATRIC BOTTLE  4CC  Final   Culture   Final    NO GROWTH 2 DAYS Performed at [facility]    Report Status PENDING  Incomplete      Radiology Studies: Dg Chest 2 View  Result Date: 09/03/2016 CLINICAL DATA:  Bilateral ankle swelling worse over the past week. History of coronary artery disease and hypertension. Former smoker. Cough and fever. EXAM: CHEST  2 VIEW COMPARISON:  11/28/2015 FINDINGS: Shallow inspiration. Mild cardiac enlargement with pulmonary vascular congestion. Slight interstitial pattern to the lung bases may represent early interstitial edema. No focal consolidation. No blunting of costophrenic angles. No pneumothorax. Calcified and tortuous aorta. Degenerative changes in the spine. IMPRESSION: Mild congestive changes in the heart and lungs with mild interstitial edema. No effusion or consolidation. Electronically Signed   By: Lucienne Capers M.D.   On: 09/03/2016 21:43   Dg Ankle Complete Left  Result Date: 09/03/2016 CLINICAL DATA:  Ankle swelling EXAM: LEFT ANKLE COMPLETE - 3+ VIEW COMPARISON:  08/03/2016 FINDINGS: Avulsion injury medial malleolus as before, probably chronic. Slight lucency in the lateral talar dome. Probable old avulsion injury fibular malleolus. Tiny os ossific densities project over medial joint space, cannot exclude tiny loose bodies. Soft tissue swelling, moderate. Mild cortical irregularity of the anterior inferior tibia, partially related to degenerative changes. IMPRESSION: 1. Moderate soft tissue swelling. No definite acute osseous abnormality. 2. Probable old avulsion injuries of the medial and lateral malleolus. 3. Cortical irregularity of the anterior inferior  tibia, likely on the basis of degenerative changes. 4.  Questionable OCD lesion lateral tailor dome. Electronically Signed   By: Donavan Foil M.D.   On: 09/03/2016 21:28   Dg Ankle Complete Right  Result Date: 09/03/2016 CLINICAL DATA:  Bilateral ankle swelling, worse over the past week. Patient is on a diuretic. Fall. EXAM: RIGHT ANKLE - COMPLETE 3+ VIEW COMPARISON:  Right foot 04/10/2005 FINDINGS: Focal linear lucency and cortical irregularity at the medial malleolus may represent avulsion fracture. Medial soft tissue swelling about the right ankle. No acute displaced fractures are identified. Degenerative changes in the right ankle and intertarsal joints. No focal bone lesion or bone destruction. Vascular calcifications in the soft tissues. IMPRESSION: Focal cortical changes of the medial malleolus may indicate an avulsion fracture. Soft tissue swelling. Degenerative changes. Electronically Signed   By: Lucienne Capers M.D.   On: 09/03/2016 21:26   Dg Foot Complete Left  Result Date: 09/03/2016 CLINICAL DATA:  Bilateral ankle swelling worse over the past week. Fall. EXAM: LEFT FOOT - COMPLETE 3+ VIEW COMPARISON:  None. FINDINGS: Soft tissue swelling over the left forefoot. Mild degenerative changes in the intertarsal and first metatarsal-phalangeal joints. No acute fracture or dislocation. No focal bone lesion or bone destruction. IMPRESSION: Mild soft tissue swelling. Degenerative changes. No acute bony abnormalities. Electronically Signed   By: Lucienne Capers M.D.   On: 09/03/2016 21:29   Dg Foot Complete Right  Result Date: 09/03/2016 CLINICAL DATA:  Bilateral ankle swelling, worse over the past week. Fall. EXAM: RIGHT FOOT COMPLETE - 3+ VIEW COMPARISON:  Right foot 04/10/2005 FINDINGS: Soft tissue swelling over the dorsum of the right foot. Mild degenerative changes in the intertarsal joints. No evidence of acute fracture or dislocation. Vague focal lucency in the calcaneus is unchanged since prior study and probably represents a small bone cyst  or lipoma. No destructive or expansile bone lesions. IMPRESSION: Dorsal soft tissue swelling over the right foot. Mild degenerative changes. No acute bony abnormalities. Electronically Signed   By: Lucienne Capers M.D.   On: 09/03/2016 21:32   Dg Hip Unilat With Pelvis 2-3 Views Left  Result Date: 09/03/2016 CLINICAL DATA:  Hip pain.  Injury. EXAM: DG HIP (WITH OR WITHOUT PELVIS) 2-3V LEFT COMPARISON:  None. FINDINGS: Degenerative changes lumbar spine and both hips. No acute bony abnormality. No evidence of fracture or dislocation. Aortoiliac atherosclerotic vascular calcification. Stable calcific densities noted over the left abdomen. Surgical clips in the pelvis. IMPRESSION: 1. No acute bony or joint abnormality. Degenerative changes lumbar spine and both hips. No evidence of fracture. 2. Aortoiliac atherosclerotic vascular disease . Electronically Signed   By: Marcello Moores  Register   On: 09/03/2016 16:38        Scheduled Meds: . benzonatate  100 mg Oral BID  . carvedilol  6.25 mg Oral BID WC  . guaiFENesin  600 mg Oral BID  . insulin aspart  0-5 Units Subcutaneous QHS  . insulin aspart  0-9 Units Subcutaneous TID WC  . ipratropium-albuterol  3 mL Nebulization TID  . levofloxacin  500 mg Oral Q48H  . levothyroxine  125 mcg Oral QAC breakfast  . montelukast  10 mg Oral QHS  . nitroGLYCERIN  0.6 mg Transdermal Daily  . pantoprazole  40 mg Oral QAC breakfast  . potassium chloride  40 mEq Oral BID  . pravastatin  20 mg Oral QHS  . tamsulosin  0.4 mg Oral Daily   Continuous Infusions:   LOS: 4 days  Time spent: 25 minutes  Greater than 50% of the time spent on counseling and coordinating the care.   Leisa Lenz, MD [facility] Pager [phone]  If 7PM-7AM, please contact night-coverage [email or web address] Password [email or web address] 09/07/2016, 10:50 AM

## 2016-09-07 NOTE — Care Management Note (Signed)
Case Management Note  Patient Details  Name: Brian Mcmillan MRN: [id number] Date of Birth: Mar 09, 1925  Subjective/Objective:    PT-recc HHPT. Spouse chose [facility] rep aware of HHPT referral. Awaiting HHPT order.Informed patient/spouse to contact pcp for concerns about a scooter-spouse voiced understanding.                Action/Plan:d/c plan home w/HHPT.   Expected Discharge Date:                  Expected Discharge Plan:  [place]  In-House Referral:     Discharge planning Services  CM Consult  Post Acute Care Choice:    Choice offered to:  Patient  DME Arranged:    DME Agency:     HH Arranged:    [place] Agency:  [place]  Status of Service:  In process, will continue to follow  If discussed at Long Length of Stay Meetings, dates discussed:    Additional Comments:  Dessa Phi, RN 09/07/2016, 1:51 PM

## 2016-09-07 NOTE — Evaluation (Signed)
Physical Therapy Evaluation Patient Details Name: Brian Mcmillan MRN: [id number] DOB: 04/30/1925 Today's Date: 09/07/2016   History of Present Illness  Brian Mcmillan is a 82 y.o. male with a past medical history significant for ischemic heart disease and CHF EF 45%, HTN, hypothyroidism, renal CA s/p ablation, hx of TIA, MGUS and NIDDM who presents 11/24/17with bilateral ankle pain for three weeks and is found to have elevated creatinine, new onset Afib, elevated troponin,  Acute on chronic systolic and diastolic CHF (congestive heart failure) ([place]) / Acute respiratory failure with hypoxia  Clinical Impression  The patient is mobilizing much better with 1 assist, ambulated x 20'. The patient declines SNF. Wife and sons are available.  Pt admitted with above diagnosis. Pt currently with functional limitations due to the deficits listed below (see PT Problem List).  Pt will benefit from skilled PT to increase their independence and safety with mobility to allow discharge to the venue listed below.       Follow Up Recommendations Home health PT;Supervision/Assistance - 24 hour    Equipment Recommendations  None recommended by PT (patient would klike a scooter which is arranged via  outpatien/HH)    Recommendations for Other Services       Precautions / Restrictions Precautions Precautions: Fall Precaution Comments: monitor HR      Mobility  Bed Mobility Overal bed mobility: Needs Assistance Bed Mobility: Supine to Sit           General bed mobility comments: extra time, gradually patient was able to self assist to  edge of bed .  Transfers Overall transfer level: Needs assistance   Transfers: Sit to/from Stand;Stand Pivot Transfers Sit to Stand: Min assist Stand pivot transfers: Min assist       General transfer comment: Extra time to stand  from raised bed and from [facility] with UE support. Min assist to slowly pivot to  [facility], cues for safety .    Ambulation/Gait Ambulation/Gait assistance: Min assist Ambulation Distance (Feet): 20 Feet Assistive device: Rolling walker (2 wheeled) Gait Pattern/deviations: Step-through pattern     General Gait Details: verys slow speed, gait is steady. HR 71  Stairs            Wheelchair Mobility    Modified Rankin (Stroke Patients Only)       Balance Overall balance assessment: Needs assistance Sitting-balance support: Feet supported;No upper extremity supported Sitting balance-Leahy Scale: Good     Standing balance support: During functional activity;Bilateral upper extremity supported Standing balance-Leahy Scale: Fair                               Pertinent Vitals/Pain Pain Assessment: 0-10 Pain Score: 5  Pain Location: left ankle Pain Descriptors / Indicators: Discomfort;Grimacing;Guarding;Tender Pain Intervention(s): Monitored during session    Home Living Family/patient expects to be discharged to:: Private residence Living Arrangements: Spouse/significant other Available Help at Discharge: Family Type of Home: House Home Access: Ramped entrance     Home Layout: One level Home Equipment: No Name - Designer, fashion/clothing - 2 wheels      Prior Function Level of Independence: Independent with assistive device(s)         Comments: until unable to ambulate PTA so was using WC     Hand Dominance        Extremity/Trunk Assessment   Upper Extremity Assessment: Generalized weakness           Lower Extremity  Assessment: Generalized weakness      Cervical / Trunk Assessment: Kyphotic  Communication   Communication: HOH  Cognition Arousal/Alertness: Awake/alert Behavior During Therapy: WFL for tasks assessed/performed Overall Cognitive Status: Within Functional Limits for tasks assessed                      General Comments      Exercises     Assessment/Plan    PT Assessment Patient needs continued PT  services  PT Problem List Decreased strength;Decreased activity tolerance;Decreased balance;Decreased mobility;Decreased knowledge of precautions;Decreased safety awareness;Decreased knowledge of use of DME          PT Treatment Interventions DME instruction;Gait training;Functional mobility training;Therapeutic activities;Therapeutic exercise;Patient/family education    PT Goals (Current goals can be found in the Care Plan section)  Acute Rehab PT Goals Patient Stated Goal: to go home PT Goal Formulation: With patient/family Time For Goal Achievement: 09/21/16 Potential to Achieve Goals: Good    Frequency Min 3X/week   Barriers to discharge        Co-evaluation               End of Session   Activity Tolerance: Patient tolerated treatment well Patient left: in chair;with call bell/phone within reach;with nursing/sitter in room;with chair alarm set;with family/visitor present Nurse Communication: Mobility status         Time: 1020-1052 PT Time Calculation (min) (ACUTE ONLY): 32 min   Charges:   PT Evaluation $PT Eval Low Complexity: 1 Procedure PT Treatments $Gait Training: 8-22 mins   PT G Codes:        Claretha Cooper 09/07/2016, 10:59 AM Tresa Endo PT [phone]

## 2016-09-07 NOTE — Care Management Important Message (Signed)
Important Message  Patient Details  Name: [name] MRN: [id number] Date of Birth: 13-Aug-1925   Medicare Important Message Given:  Yes    Camillo Flaming 09/07/2016, 11:50 AMImportant Message  Patient Details  Name: [name] MRN: [id number] Date of Birth: 1925-03-22   Medicare Important Message Given:  Yes    Camillo Flaming 09/07/2016, 11:50 AM

## 2016-09-08 DIAGNOSIS — D72829 Elevated white blood cell count, unspecified: Secondary | ICD-10-CM

## 2016-09-08 DIAGNOSIS — D472 Monoclonal gammopathy: Secondary | ICD-10-CM

## 2016-09-08 DIAGNOSIS — D72821 Monocytosis (symptomatic): Secondary | ICD-10-CM

## 2016-09-08 LAB — MAGNESIUM: MAGNESIUM: 1.8 mg/dL (ref 1.7–2.4)

## 2016-09-08 LAB — BASIC METABOLIC PANEL
Anion gap: 10 (ref 5–15)
BUN: 49 mg/dL — AB (ref 6–20)
CALCIUM: 9.3 mg/dL (ref 8.9–10.3)
CO2: 25 mmol/L (ref 22–32)
CREATININE: 3.69 mg/dL — AB (ref 0.61–1.24)
Chloride: 101 mmol/L (ref 101–111)
GFR calc Af Amer: 15 mL/min — ABNORMAL LOW (ref >60)
GFR calc non Af Amer: 13 mL/min — ABNORMAL LOW (ref >60)
GLUCOSE: 93 mg/dL (ref 65–99)
Potassium: 4.4 mmol/L (ref 3.5–5.1)
Sodium: 136 mmol/L (ref 135–145)

## 2016-09-08 LAB — CBC
HCT: 32.2 % — ABNORMAL LOW (ref 39.0–52.0)
Hemoglobin: 10.8 g/dL — ABNORMAL LOW (ref 13.0–17.0)
MCH: 30.1 pg (ref 26.0–34.0)
MCHC: 33.5 g/dL (ref 30.0–36.0)
MCV: 89.7 fL (ref 78.0–100.0)
PLATELETS: 115 10*3/uL — AB (ref 150–400)
RBC: 3.59 MIL/uL — ABNORMAL LOW (ref 4.22–5.81)
RDW: 18.4 % — AB (ref 11.5–15.5)
WBC: 37.2 10*3/uL — ABNORMAL HIGH (ref 4.0–10.5)

## 2016-09-08 LAB — GLUCOSE, CAPILLARY
Glucose-Capillary: 110 mg/dL — ABNORMAL HIGH (ref 65–99)
Glucose-Capillary: 178 mg/dL — ABNORMAL HIGH (ref 65–99)
Glucose-Capillary: 126 mg/dL — ABNORMAL HIGH (ref 65–99)
Glucose-Capillary: 168 mg/dL — ABNORMAL HIGH (ref 65–99)

## 2016-09-08 LAB — CULTURE, BLOOD (ROUTINE X 2): CULTURE: NO GROWTH

## 2016-09-08 LAB — PROCALCITONIN: Procalcitonin: 0.34 ng/mL

## 2016-09-08 MED ORDER — LORAZEPAM 0.5 MG PO TABS
0.5000 mg | ORAL_TABLET | Freq: Once | ORAL | Status: AC
  Administered 2016-09-08: 0.5 mg via ORAL
  Filled 2016-09-08: qty 1

## 2016-09-08 NOTE — Progress Notes (Signed)
PROGRESS NOTE  Brian Mcmillan [id number] DOB: December 11, 1924 DOA: 09/03/2016 PCP: [name], MD  HPI/Recap of past 44 hours:  82 year old male with past mental history of ischemic heart disease and chronic systolic CHF with ejection fraction of 45% as well as history of renal cancer status post ablation who was admitted on 11/24 after having bilateral ankle pain 3 weeks and had several falls over the past month. At that time, patient reported a fever at home. Emergency room, patient was afebrile but noted to have a significant leukocytosis at 22. He was also found to have new onset atrial fibrillation. There was a? Of right foot cellulitis due to edematous appearance and soft tissue swelling on x-ray and patient was started on antibiotics. Over the next few days, patient has clinically improved. He was also found to be in mild systolic heart failure and started on diuretics by cardiology. His white count however has continued to trend upward and is currently at 37. Peripheral smear noted by pathology a primary leukocytosis with atypical monocytes. Hematology consulted.  Patient self no complaints. Breathing comfortably. States that his ankle pain continues to get better and better every day.  Assessment/Plan: Principal Problem:    Hypothyroidism, acquired continue Synthroid   Essential hypertension   Coronary artery disease due to lipid rich plaque   Acute on stage III chronic kidney failure ([place]): Creatinine continues to trend upward, likely in the setting of diuresis. Lasix on hold. Continue to monitor   Thrombocytopenia (HCC)      Acute on chronic combined systolic and diastolic CHF, NYHA class 2 [facility]): Patient has responded well to Lasix, diuresing over 70 L since admission and weight down 5 pounds. His creatinine has jumped up some so Lasix has been on hold since 11/28.   Paroxysmal atrial fibrillation (HCC) new-onset: Asymptomatic at this time and has since converted to normal  sinus rhythm. His chads 2 score is at 5. Patient is at a previous history of severe nosebleeds and does not want to discuss any anticoagulation. Continue Coreg which has been titrated up    Leukocytosis: No new principal problem. Given peripheral smear, concerning for some cell disorder. Hematology to see.    Lobar pneumonia, unspecified organism [facility]): On empiric Levaquin since 11/26. Blood cultures with no growth.   Troponin level elevated: No evidence of ischemia, felt to be secondary to elevated heart rate   Code Status: Full code   Family Communication: Wife at the bedside   Disposition Plan: Based off of determination from hematology, patient is much improving. Once creatinine comes down and we had a reason for his white blood cell count, despite discharge in a few days    Consultants:  Hematology  Cardiology   Procedures:  None   Antimicrobials:   Ancef 11/24-11/28  Levaquin 11/26-present  DVT prophylaxis: SCDs   Objective: Vitals:   09/07/16 1840 09/07/16 2214 09/08/16 0627 09/08/16 0744  BP:  (!) 118/55 (!) 132/49   Pulse:  62 64 63  Resp:  18 18 18   Temp:  98 F (36.7 C) 97.8 F (36.6 C)   TempSrc:  Oral Oral   SpO2: 95% 97% 96% 95%  Weight:   89.6 kg (197 lb 8 oz)   Height:        Intake/Output Summary (Last 24 hours) at 09/08/16 1600 Last data filed at 09/08/16 0031  Gross per 24 hour  Intake  0 ml  Output              500 ml  Net             -500 ml   Filed Weights   09/06/16 0513 09/07/16 0500 09/08/16 0627  Weight: 90.1 kg (198 lb 11.2 oz) 90.4 kg (199 lb 4.7 oz) 89.6 kg (197 lb 8 oz)    Exam:   General:  Alert and oriented 3, no acute distress   Cardiovascular: Regular rate and rhythm, S1-S2   Respiratory: Clear to auscultation bilaterally   Abdomen: Soft, nontender, nondistended, positive bowel sounds   Musculoskeletal: No clubbing or cyanosis, trace pitting edema   Skin: Significant bruising on the lateral  aspect of the left ankle  Psychiatry: Patient appropriate, no evidence of psychoses    Data Reviewed: CBC:  Recent Labs Lab 09/03/16 1605 09/04/16 0535 09/05/16 0544 09/06/16 0521 09/07/16 0556 09/08/16 0544  WBC 27.1* 20.8* 23.1* 29.4* 37.2* 37.2*  NEUTROABS 1.9  --   --   --   --   --   HGB 12.0* 10.1* 10.3* 11.2* 11.4* 10.8*  HCT 35.2* 30.2* 31.0* 33.3* 34.5* 32.2*  MCV 88.0 89.9 89.9 88.1 89.8 89.7  PLT 101* 88* 100* 107* 127* [id number]*   Basic Metabolic Panel:  Recent Labs Lab 09/03/16 2314  09/04/16 2030 09/05/16 0544 09/06/16 0521 09/07/16 0556 09/08/16 0544  NA  --   < > 137 141 136 135 136  K  --   < > 3.9 4.0 4.2 4.7 4.4  CL  --   < > 102 106 100* 99* 101  CO2  --   < > 26 27 26 24 25   GLUCOSE  --   < > 112* 100* 104* 125* 93  BUN  --   < > 37* 36* 35* 42* 49*  CREATININE  --   < > 2.44* 2.31* 2.42* 3.22* 3.69*  CALCIUM  --   < > 8.9 9.0 9.1 9.5 9.3  MG 1.8  --   --   --   --   --  1.8  < > = values in this interval not displayed. GFR: Estimated Creatinine Clearance: 15.8 mL/min (by C-G formula based on SCr of 3.69 mg/dL (H)). Liver Function Tests: No results for input(s): AST, ALT, ALKPHOS, BILITOT, PROT, ALBUMIN in the last 168 hours. No results for input(s): LIPASE, AMYLASE in the last 168 hours. No results for input(s): AMMONIA in the last 168 hours. Coagulation Profile:  Recent Labs Lab 09/04/16 0141  INR 1.31   Cardiac Enzymes:  Recent Labs Lab 09/03/16 2314 09/04/16 0535 09/04/16 1010 09/04/16 1620  TROPONINI 0.79* 0.47* 0.54* 0.62*   BNP (last 3 results) No results for input(s): PROBNP in the last 8760 hours. HbA1C: No results for input(s): HGBA1C in the last 72 hours. CBG:  Recent Labs Lab 09/07/16 1156 09/07/16 1647 09/07/16 2212 09/08/16 0749 09/08/16 1209  GLUCAP 166* 98 161* 110* 178*   Lipid Profile: No results for input(s): CHOL, HDL, LDLCALC, TRIG, CHOLHDL, LDLDIRECT in the last 72 hours. Thyroid Function  Tests: No results for input(s): TSH, T4TOTAL, FREET4, T3FREE, THYROIDAB in the last 72 hours. Anemia Panel: No results for input(s): VITAMINB12, FOLATE, FERRITIN, TIBC, IRON, RETICCTPCT in the last 72 hours. Urine analysis:    Component Value Date/Time   COLORURINE YELLOW 09/03/2016 2030   APPEARANCEUR CLOUDY (A) 09/03/2016 2030   LABSPEC 1.020 09/03/2016 2030   PHURINE 5.0 09/03/2016 2030   GLUCOSEU  NEGATIVE 09/03/2016 2030   HGBUR SMALL (A) 09/03/2016 2030   [place] NEGATIVE 09/03/2016 2030   [place] NEGATIVE 09/03/2016 2030   PROTEINUR 30 (A) 09/03/2016 2030   UROBILINOGEN 0.2 12/04/2014 2042   NITRITE NEGATIVE 09/03/2016 2030   LEUKOCYTESUR NEGATIVE 09/03/2016 2030   Sepsis Labs: @LABRCNTIP (procalcitonin:4,lacticidven:4)  ) Recent Results (from the past 240 hour(s))  Culture, blood (routine x 2)     Status: None   Collection Time: 09/03/16 10:29 PM  Result Value Ref Range Status   Specimen Description BLOOD RIGHT ANTECUBITAL  Final   Special Requests BOTTLES DRAWN AEROBIC AND ANAEROBIC 5 CC  Final   Culture   Final    NO GROWTH 5 DAYS Performed at [facility]    Report Status 09/08/2016 FINAL  Final  Culture, blood (routine x 2)     Status: None (Preliminary result)   Collection Time: 09/03/16 10:30 PM  Result Value Ref Range Status   Specimen Description BLOOD LEFT ANTECUBITAL  Final   Special Requests IN PEDIATRIC BOTTLE  4CC  Final   Culture   Final    NO GROWTH 4 DAYS Performed at [facility]    Report Status PENDING  Incomplete      Studies: No results found.  Scheduled Meds: . benzonatate  100 mg Oral BID  . carvedilol  6.25 mg Oral BID WC  . guaiFENesin  600 mg Oral BID  . insulin aspart  0-5 Units Subcutaneous QHS  . insulin aspart  0-9 Units Subcutaneous TID WC  . levofloxacin  500 mg Oral Q48H  . levothyroxine  125 mcg Oral QAC breakfast  . montelukast  10 mg Oral QHS  . nitroGLYCERIN  0.6 mg Transdermal Daily  .  pantoprazole  40 mg Oral QAC breakfast  . pravastatin  20 mg Oral QHS  . tamsulosin  0.4 mg Oral Daily    Continuous Infusions:   LOS: 5 days     Annita Brod, MD [facility] Pager [phone]  If 7PM-7AM, please contact night-coverage [email or web address] Password [email or web address] 09/08/2016, 4:00 PM

## 2016-09-08 NOTE — Progress Notes (Signed)
Patient Name: Brian Mcmillan Date of Encounter: 09/08/2016  Primary Cardiologist: Dr. Alethia Berthold [facility] Problem List     Principal Problem:   Acute on chronic systolic CHF (congestive heart failure) (HCC) Active Problems:   Hypothyroidism, acquired   Essential hypertension   Coronary artery disease due to lipid rich plaque   Acute on chronic kidney failure (HCC)   Thrombocytopenia (HCC)   Cellulitis   Acute on chronic combined systolic and diastolic CHF, NYHA class 2 (HCC)   Paroxysmal atrial fibrillation (HCC)   Leukocytosis   Lobar pneumonia, unspecified organism ([place])   Troponin level elevated    Subjective   No chest discomfort or palpitations. Inquiring about when he can go home.   Inpatient Medications    Scheduled Meds: . benzonatate  100 mg Oral BID  . carvedilol  6.25 mg Oral BID WC  . guaiFENesin  600 mg Oral BID  . insulin aspart  0-5 Units Subcutaneous QHS  . insulin aspart  0-9 Units Subcutaneous TID WC  . levofloxacin  500 mg Oral Q48H  . levothyroxine  125 mcg Oral QAC breakfast  . montelukast  10 mg Oral QHS  . nitroGLYCERIN  0.6 mg Transdermal Daily  . pantoprazole  40 mg Oral QAC breakfast  . pravastatin  20 mg Oral QHS  . tamsulosin  0.4 mg Oral Daily   Continuous Infusions:  PRN Meds: acetaminophen **OR** acetaminophen, ipratropium-albuterol, ondansetron **OR** ondansetron (ZOFRAN) IV, traMADol   Vital Signs    Vitals:   09/07/16 1840 09/07/16 2214 09/08/16 0627 09/08/16 0744  BP:  (!) 118/55 (!) 132/49   Pulse:  62 64 63  Resp:  18 18 18   Temp:  98 F (36.7 C) 97.8 F (36.6 C)   TempSrc:  Oral Oral   SpO2: 95% 97% 96% 95%  Weight:   197 lb 8 oz (89.6 kg)   Height:        Intake/Output Summary (Last 24 hours) at 09/08/16 0838 Last data filed at 09/08/16 0031  Gross per 24 hour  Intake                0 ml  Output              700 ml  Net             -700 ml   Filed Weights   09/06/16 0513 09/07/16 0500 09/08/16 [id number]   Weight: 198 lb 11.2 oz (90.1 kg) 199 lb 4.7 oz (90.4 kg) 197 lb 8 oz (89.6 kg)    Physical Exam    GEN: Well nourished, well developed Caucasian male appearing in no acute distress.  HEENT: Grossly normal.  Neck: Supple, no JVD, carotid bruits, or masses. Cardiac: RRR, no murmurs, rubs, or gallops. No clubbing, cyanosis, edema.  Radials/DP/PT 2+ and equal bilaterally.  Respiratory:  Respirations regular and unlabored, clear to auscultation bilaterally. GI: Soft, nontender, nondistended, BS + x 4. MS: no deformity or atrophy. Skin: warm and dry, left ankle swollen and with significant erythema.  Neuro:  Strength and sensation are intact. Psych: AAOx3.  Normal affect.  Labs    CBC  Recent Labs  09/07/16 0556 09/08/16 0544  WBC 37.2* 37.2*  HGB 11.4* 10.8*  HCT 34.5* 32.2*  MCV 89.8 89.7  PLT 127* [id number]*   Basic Metabolic Panel  Recent Labs  09/07/16 0556 09/08/16 0544  NA 135 136  K 4.7 4.4  CL 99* 101  CO2 24 25  GLUCOSE 125* 93  BUN 42* 49*  CREATININE 3.22* 3.69*  CALCIUM 9.5 9.3  MG  --  1.8     Telemetry    NSR, HR in 60-70's. Occasional episodes of Ventricular Trigeminy. - Personally Reviewed  ECG    No new tracings.   Radiology    No results found.  Cardiac Studies   Echocardiogram: 09/04/2016 Study Conclusions  - Left ventricle: The cavity size was at the upper limits of normal. Wall thickness was normal. The estimated ejection fraction was in the range of 45% to 50%. Hypokinesis of the basalinferolateral and inferior myocardium. Features are consistent with a pseudonormal left ventricular filling pattern, with concomitant abnormal relaxation and increased filling pressure (grade 2 diastolic dysfunction). Acoustic contrast opacification revealed no evidence ofthrombus. - Mitral valve: There was mild regurgitation. - Left atrium: The atrium was mildly dilated.  Patient Profile     82 yo male w/ PMH of CAD, chronic  combined systolic and diastolic CHF, HTN, HLD, and history of severe epistaxis with antiplatelet/anticoagulation admitted with CHF exacerbation, PNA, and new onset atrial fibrillation.   Assessment & Plan    1. New Onset Atrial fibrillation - newly recognized this admission. Asymptomatic at that time. Has since converted to NSR.  - This patients CHA2DS2-VASc Score and unadjusted Ischemic Stroke Rate (% per year) is equal to 7.2 % stroke rate/year from a score of 5 (CHF, HTN, Vascular, Age (2)). He refuses to consider anti-coagulation with his history of severe epistaxis. He could consider the WATCHMAN device if he would be willing to use coumadin for one month. Will need to discuss further with his Primary Cardiologist.  - continue Coreg 6.25mg  BID. He does have occasional ectopy on telemetry with episodes of ventricular trigeminy. Can consider titration of Coreg to 9.375mg  BID, however he is asymptomatic with these episodes.     2. Acute on chronic systolic and diastolic CHF - LVEF [id number] by echo 09/04/16. He has diuresed -7.1L this admission. Weight down 5 lbs since admission. With stark increase in creatinine (2.42 --> 3.22 -->3.69), will continue to hold Lasix today (was given dose of IV Lasix during AM of 09/07/2016). Reassess kidney function in AM. If trending upwards, would obtain renal US. On PO Lasix 20mg  daily PTA. Continue BB. No ACE-I/ARB with renal insufficiency.   3. Elevated troponin - Likely due to elevated HR on admission. No angina to suggest ACS. EKG and echo suggestive of prior inferior MI. Will not plan ischemic evaluation this admission.   4. HTN - BP well-controlled at 118/49 - 132/68 in the past 24 hours.   5. Lobar PNA/ Cellulitis - WBC trending upwards to 37.2 on 09/08/2016. Significant erythema along left ankle. (check Uric Acid to assess for gout).   - per admitting team  6. Acute on Chronic Stage 4 CKD - creatinine 2.53 on admission, up to 3.69 this AM. Lasix  held as above. - repeat BMET in AM.   Signed, Erma Heritage, PA  09/08/2016, 8:38 AM   I have personally seen and examined this patient with Bernerd Pho, PA-C. I agree with the assessment and plan as outlined above. He is in sinus today. Continue Coreg. He refuses anti-coagulation. Volume status is much better but renal function has worsened. Lasix on hold today. Would continue to hold lasix until renal function improves.   Lauree Chandler 09/08/2016 10:35 AM

## 2016-09-08 NOTE — Progress Notes (Signed)
[place] [phone]   [phone]  CONSULT NOTE  REFERRING PHYSICIAN: Dr. Gevena Barre  REASON FOR CONSULTATION:  82 years old white male with leukocytosis.  HPI Brian Mcmillan is a 82 y.o. male with past medical history significant for multiple medical problems including history of coronary artery disease and congestive heart failure, hypertension, dyslipidemia, hypothyroidism, and history of renal cell carcinoma status post radiofrequency ablation at [facility], MGUS, and thrombocytopenia. The patient was followed by Dr. [name] at [facility] for several years for MGUS as well as lymphadenopathy secondary to fat necrosis in addition to the history of renal cell carcinoma. He has been doing fine for several years and last CBC more than a year ago showed normal white blood count of 4.8, mild anemia with hemoglobin 12.4, hematocrit 36.7% and thrombocytopenia with platelets count of 129,000. Differential at that time showed absolute monocyte count 600 and absolute neutrophil count of 1900. He was admitted recently to [facility] with bilateral ankle pain and swelling as well as generalized weakness. He was admitted for treatment of acute on chronic systolic congestive heart failure as well as new onset atrial fibrillation. He was also treated for cellulitis of the right foot. The patient of his admission his CBC showed elevated white blood count of 27.1, hemoglobin 12.0, hematocrit 35.2%, platelets count 101,000. The differential showed absolute monocyte count 22,200 with absolute neutrophil count of 1900. he was started on antibiotic with Cefazolin but his total white blood count continues to increase. CBC earlier today showed white blood count 37.2, hemoglobin 10.8, hematocrit 32.2%, platelets count 115,000. Unfortunately there was no differential. Peripheral blood smear examined by pathology and it showed leukocytosis  with atypical monocytosis. There was also a normocytic anemia and thrombocytopenia. I was consulted today for evaluation and recommendation regarding his persistent leukocytosis and monocytosis. Of note that the patient had bone marrow biopsy and aspiration 2009 that showed mild plasmacytosis but no concerning findings for myeloproliferative disorder at that time. He was followed by Dr. [name] for MGUS. When seen today he continues to complain of fatigue and weakness. He also has shortness of breath with exertion. He denied having any significant chest pain. He has no bleeding issues. The patient is married and has 2 sons. He used to work in a [employer]. He has history of smoking but quit several years ago. No history of alcohol or drug abuse.  HPI  Past Medical History:  Diagnosis Date  . Abdominal mass 2008   chronic inflammatory mass of the mesentary Dr [name]  . CAD (coronary artery disease)    cath 09/23/09 70% Dx1 (diffuse disease), 40% Cx, Large RCA 80-90% with heavy calcification / cath 06/2010 no change tiht RCA still best to treat medially with nosebleeds  . Chest pain    myoview 2006 no ischemia/ myoview 2009 no ischemia  . Dyslipidemia   . Hypertension    no RAS  . Hypothyroidism   . Kidney mass 2008   radiofrequency ablation at [facility]  . Left ventricular dysfunction    myoview 2006 normal / myoview 2009 normal / EF 60% cath 09/2009, EF 60% cath 06/2010  . Memory impairment    memory decreasing 06/2010  . Nosebleed    significant, can not take ASA  . Past heart attack    X2  . Rash Sept 2011   rash on buttocks, hospitalized hydralazine stopped but then restarted w/o return of rash  .  Systolic click    no mitral valve prolapse    Past Surgical History:  Procedure Laterality Date  . BACK SURGERY    . BONE MARROW ASPIRATION     X2  . BONE MARROW BIOPSY     X2  . COLONOSCOPY N/A 03/07/2013   Procedure: COLONOSCOPY;  Surgeon: [name], MD;   Location: [facility];  Service: Endoscopy;  Laterality: N/A;  830-moved to 50 Ann to notify pt  . COLONOSCOPY  01/2013  . lymph node removal     right abdomen  . LYMPH NODE removal     robotic laser @ [facility]  on left side of stomach  . RFA RIGHT KIDNEY      Family History  Problem Relation Age of Onset  . Cancer Maternal Grandfather   . Coronary artery disease      Social History Social History  Substance Use Topics  . Smoking status: Former Smoker    Packs/day: 0.50    Years: 42.00    Types: Cigarettes    Quit date: 10/11/1980  . Smokeless tobacco: Never Used  . Alcohol use No    Allergies  Allergen Reactions  . Ranolazine Shortness Of Breath  . Simvastatin Other (See Comments)    fatigue  . Aspirin Other (See Comments)    Causes severe bleeding  . Contrast Media [Iodinated Diagnostic Agents] Other (See Comments)    Will shut kidneys down  . Ibuprofen Other (See Comments)    Nose bleeds  . Ioxaglate Other (See Comments)    Will shut kidneys down    Current Facility-Administered Medications  Medication Dose Route Frequency Provider Last Rate Last Dose  . acetaminophen (TYLENOL) tablet 650 mg  650 mg Oral Q6H PRN Edwin Dada, MD   650 mg at 09/07/16 2125   Or  . acetaminophen (TYLENOL) suppository 650 mg  650 mg Rectal Q6H PRN Edwin Dada, MD      . benzonatate (TESSALON) capsule 100 mg  100 mg Oral BID Robbie Lis, MD   100 mg at 09/08/16 0920  . carvedilol (COREG) tablet 6.25 mg  6.25 mg Oral BID WC Edwin Dada, MD   6.25 mg at 09/08/16 0920  . guaiFENesin (MUCINEX) 12 hr tablet 600 mg  600 mg Oral BID Robbie Lis, MD   600 mg at 09/08/16 0920  . insulin aspart (novoLOG) injection 0-5 Units  0-5 Units Subcutaneous QHS Edwin Dada, MD      . insulin aspart (novoLOG) injection 0-9 Units  0-9 Units Subcutaneous TID WC Edwin Dada, MD   2 Units at 09/08/16 1259  . ipratropium-albuterol (DUONEB) 0.5-2.5 (3) MG/3ML  nebulizer solution 3 mL  3 mL Nebulization Q2H PRN Robbie Lis, MD   3 mL at 09/05/16 0910  . levofloxacin (LEVAQUIN) tablet 500 mg  500 mg Oral Q48H Angela Adam, RPH   500 mg at 09/07/16 1024  . levothyroxine (SYNTHROID, LEVOTHROID) tablet 125 mcg  125 mcg Oral QAC breakfast Edwin Dada, MD   125 mcg at 09/08/16 0920  . montelukast (SINGULAIR) tablet 10 mg  10 mg Oral QHS Edwin Dada, MD   10 mg at 09/07/16 2125  . nitroGLYCERIN (NITRODUR - Dosed in mg/24 hr) patch 0.6 mg  0.6 mg Transdermal Daily Edwin Dada, MD   0.6 mg at 09/08/16 0933  . ondansetron (ZOFRAN) tablet 4 mg  4 mg Oral Q6H PRN Edwin Dada, MD  Or  . ondansetron (ZOFRAN) injection 4 mg  4 mg Intravenous Q6H PRN Edwin Dada, MD      . pantoprazole (PROTONIX) EC tablet 40 mg  40 mg Oral QAC breakfast Edwin Dada, MD   40 mg at 09/08/16 0920  . pravastatin (PRAVACHOL) tablet 20 mg  20 mg Oral QHS Edwin Dada, MD   20 mg at 09/07/16 2125  . tamsulosin (FLOMAX) capsule 0.4 mg  0.4 mg Oral Daily Edwin Dada, MD   0.4 mg at 09/08/16 0920  . traMADol (ULTRAM) tablet 50 mg  50 mg Oral Q6H PRN Edwin Dada, MD        Review of Systems  Constitutional: positive for fatigue Eyes: negative Ears, nose, mouth, throat, and face: negative Respiratory: positive for dyspnea on exertion Cardiovascular: negative Gastrointestinal: negative Genitourinary:negative Integument/breast: negative Hematologic/lymphatic: negative Musculoskeletal:positive for arthralgias and muscle weakness Neurological: negative Behavioral/Psych: negative Endocrine: negative Allergic/Immunologic: negative  Physical Exam  ERD:EYCXK, healthy, no distress, well nourished, well developed and ill looking SKIN: skin color, texture, turgor are normal, no rashes or significant lesions HEAD: Normocephalic, No masses, lesions, tenderness or abnormalities EYES: normal,  PERRLA, Conjunctiva are pink and non-injected EARS: External ears normal, Canals clear OROPHARYNX:no exudate, no erythema and lips, buccal mucosa, and tongue normal  NECK: supple, no adenopathy, no JVD LYMPH:  no palpable lymphadenopathy, no hepatosplenomegaly LUNGS: clear to auscultation , and palpation HEART: regular rate & rhythm and irregularly irregular ABDOMEN:abdomen soft, non-tender, normal bowel sounds and no masses or organomegaly BACK: Back symmetric, no curvature., No CVA tenderness EXTREMITIES:no joint deformities, effusion, or inflammation, 1+ edema  NEURO: alert & oriented x 3 with fluent speech, no focal motor/sensory deficits  PERFORMANCE STATUS: ECOG 2  LABORATORY DATA: Lab Results  Component Value Date   WBC 37.2 (H) 09/08/2016   HGB 10.8 (L) 09/08/2016   HCT 32.2 (L) 09/08/2016   MCV 89.7 09/08/2016   PLT 115 (L) 09/08/2016    _0 @  RADIOGRAPHIC STUDIES: Dg Chest 2 View  Result Date: 09/03/2016 CLINICAL DATA:  Bilateral ankle swelling worse over the past week. History of coronary artery disease and hypertension. Former smoker. Cough and fever. EXAM: CHEST  2 VIEW COMPARISON:  11/28/2015 FINDINGS: Shallow inspiration. Mild cardiac enlargement with pulmonary vascular congestion. Slight interstitial pattern to the lung bases may represent early interstitial edema. No focal consolidation. No blunting of costophrenic angles. No pneumothorax. Calcified and tortuous aorta. Degenerative changes in the spine. IMPRESSION: Mild congestive changes in the heart and lungs with mild interstitial edema. No effusion or consolidation. Electronically Signed   By: Lucienne Capers M.D.   On: 09/03/2016 21:43   Dg Ankle Complete Left  Result Date: 09/03/2016 CLINICAL DATA:  Ankle swelling EXAM: LEFT ANKLE COMPLETE - 3+ VIEW COMPARISON:  08/03/2016 FINDINGS: Avulsion injury medial malleolus as before, probably chronic. Slight lucency in the lateral talar dome. Probable old  avulsion injury fibular malleolus. Tiny os ossific densities project over medial joint space, cannot exclude tiny loose bodies. Soft tissue swelling, moderate. Mild cortical irregularity of the anterior inferior tibia, partially related to degenerative changes. IMPRESSION: 1. Moderate soft tissue swelling. No definite acute osseous abnormality. 2. Probable old avulsion injuries of the medial and lateral malleolus. 3. Cortical irregularity of the anterior inferior tibia, likely on the basis of degenerative changes. 4. Questionable OCD lesion lateral tailor dome. Electronically Signed   By: Donavan Foil M.D.   On: 09/03/2016 21:28   Dg Ankle Complete Right  Result  Date: 09/03/2016 CLINICAL DATA:  Bilateral ankle swelling, worse over the past week. Patient is on a diuretic. Fall. EXAM: RIGHT ANKLE - COMPLETE 3+ VIEW COMPARISON:  Right foot 04/10/2005 FINDINGS: Focal linear lucency and cortical irregularity at the medial malleolus may represent avulsion fracture. Medial soft tissue swelling about the right ankle. No acute displaced fractures are identified. Degenerative changes in the right ankle and intertarsal joints. No focal bone lesion or bone destruction. Vascular calcifications in the soft tissues. IMPRESSION: Focal cortical changes of the medial malleolus may indicate an avulsion fracture. Soft tissue swelling. Degenerative changes. Electronically Signed   By: Lucienne Capers M.D.   On: 09/03/2016 21:26   Dg Foot Complete Left  Result Date: 09/03/2016 CLINICAL DATA:  Bilateral ankle swelling worse over the past week. Fall. EXAM: LEFT FOOT - COMPLETE 3+ VIEW COMPARISON:  None. FINDINGS: Soft tissue swelling over the left forefoot. Mild degenerative changes in the intertarsal and first metatarsal-phalangeal joints. No acute fracture or dislocation. No focal bone lesion or bone destruction. IMPRESSION: Mild soft tissue swelling. Degenerative changes. No acute bony abnormalities. Electronically Signed    By: Lucienne Capers M.D.   On: 09/03/2016 21:29   Dg Foot Complete Right  Result Date: 09/03/2016 CLINICAL DATA:  Bilateral ankle swelling, worse over the past week. Fall. EXAM: RIGHT FOOT COMPLETE - 3+ VIEW COMPARISON:  Right foot 04/10/2005 FINDINGS: Soft tissue swelling over the dorsum of the right foot. Mild degenerative changes in the intertarsal joints. No evidence of acute fracture or dislocation. Vague focal lucency in the calcaneus is unchanged since prior study and probably represents a small bone cyst or lipoma. No destructive or expansile bone lesions. IMPRESSION: Dorsal soft tissue swelling over the right foot. Mild degenerative changes. No acute bony abnormalities. Electronically Signed   By: Lucienne Capers M.D.   On: 09/03/2016 21:32   Dg Hip Unilat With Pelvis 2-3 Views Left  Result Date: 09/03/2016 CLINICAL DATA:  Hip pain.  Injury. EXAM: DG HIP (WITH OR WITHOUT PELVIS) 2-3V LEFT COMPARISON:  None. FINDINGS: Degenerative changes lumbar spine and both hips. No acute bony abnormality. No evidence of fracture or dislocation. Aortoiliac atherosclerotic vascular calcification. Stable calcific densities noted over the left abdomen. Surgical clips in the pelvis. IMPRESSION: 1. No acute bony or joint abnormality. Degenerative changes lumbar spine and both hips. No evidence of fracture. 2. Aortoiliac atherosclerotic vascular disease . Electronically Signed   By: Marcello Moores  Register   On: 09/03/2016 16:38    ASSESSMENT: This is a very pleasant 82 years old white male with multiple medical problems and recent admission for cellulitis with persistent and progressive leukocytosis and monocytosis concerning for myeloproliferative disorder but reactive condition could not be ruled out at this point. The patient has a history of mild plasmacytosis and MGUS and was followed by Dr. [name] in the past. He had bone marrow biopsy and aspirate in 2019 was unremarkable except for mild plasmacytosis but no  other evidence of myeloproliferative disorder  PLAN: I had a lengthy discussion with the patient and his wife today about his current condition and further investigation to confirm a diagnosis of his leukocytosis and monocytosis. I recommended for the patient to have repeat bone marrow biopsy and aspirate before his discharge from the hospital. I will also order a molecular study for BCR/ABL. Current cellulitis of the lower extremity, continue his current treatment. For the congestive heart failure and atrial fibrillation, the patient will continue his current treatment by the primary care team. If the  bone marrow biopsy and aspirate showed any concerning abnormality for myeloproliferative disorder, I will arrange for the patient follow-up appointment with me at the [facility] for more detailed discussion of his treatment options. The patient voices understanding of current disease status and treatment options and is in agreement with the current care plan.  All questions were answered. The patient knows to call the clinic with any problems, questions or concerns. We can certainly see the patient much sooner if necessary.  Thank you so much for allowing me to participate in the care of Brian Mcmillan. I will continue to follow up the patient with you and assist in his care.  Disclaimer: This note was dictated with voice recognition software. Similar sounding words can inadvertently be transcribed and may not be corrected upon review.   Kinsler Soeder K. September 08, 2016, 5:06 PM

## 2016-09-08 NOTE — Progress Notes (Signed)
Pharmacy Antibiotic Note  Brian Mcmillan is a 82 y.o. male with a past medical history significant for ischemic heart disease and CHF with an EF of 45%, HTN, hypothyroidism, renal CA s/p ablation, hx of TIA, COPD and thrombocytopenia who presented with bilateral ankle pain x 3 weeks and reports experiencing several falls over the past month. Upon presentation, it was noted he had bilateral ankle swelling. He also reported a fever at home.  In the ED, he was afebrile but with leukocytosis. It was suspected he had right foot cellulitis due to edematous appearance and soft tissue swelling on x-ray and was started on cefazolin. Levofloxacin was later added for suspected lobar pneumonia.  After 3 days of therapy with both cefazolin and levofloxacin, cefazolin was d/c'd due to dual coverage.   Today, 09/08/16  - Day 5 total antibiotics, day 4 levofloxacin - Patient continues to have leukocytosis, with a stable level of 37.2 over the past 2 days. Per Dr. Maryland Pink, other causes of leukocytosis will be investigated due to lack of infectious clinical presentation.  - Patient is afebrile - Serum creatinine sharply increasing up to 3.69 today, CrCl ~15.8 mL/min - Blood cultures show no growth  Plan: Continue levofloxacin 500 mg po q48h for CrCl <76mls/min. Monitor renal function, CBC and clinical course. --------------------------------------------------------------------------------------------------------- Height: 6\' 2"  (188 cm) Weight: 197 lb 8 oz (89.6 kg) IBW/kg (Calculated) : 82.2  Temp (24hrs), Avg:97.8 F (36.6 C), Min:97.6 F (36.4 C), Max:98 F (36.7 C)   Recent Labs Lab 09/03/16 2229 09/04/16 0535  09/04/16 2030 09/05/16 0544 09/06/16 0521 09/07/16 0556 09/08/16 0544  WBC  --  20.8*  --   --  23.1* 29.4* 37.2* 37.2*  CREATININE  --   --   < > 2.44* 2.31* 2.42* 3.22* 3.69*  LATICACIDVEN 1.5  --   --   --   --   --   --   --   < > = values in this interval not displayed.   Estimated Creatinine Clearance: 15.8 mL/min (by C-G formula based on SCr of 3.69 mg/dL (H)).    Allergies  Allergen Reactions  . Ranolazine Shortness Of Breath  . Simvastatin Other (See Comments)    fatigue  . Aspirin Other (See Comments)    Causes severe bleeding  . Contrast Media [Iodinated Diagnostic Agents] Other (See Comments)    Will shut kidneys down  . Ibuprofen Other (See Comments)    Nose bleeds  . Ioxaglate Other (See Comments)    Will shut kidneys down    Antimicrobials this admission: 11/25 cefazolin >> 11/28 11/26 levaquin >>  Microbiology results: 11/24 BCx: ngtd  Thank you for allowing pharmacy to be a part of this patient's care.  Sallyanne Havers Student-PharmD 09/08/2016, 1:00 PM

## 2016-09-09 ENCOUNTER — Inpatient Hospital Stay (HOSPITAL_COMMUNITY): Payer: Medicare Other

## 2016-09-09 ENCOUNTER — Encounter (HOSPITAL_COMMUNITY): Payer: Self-pay | Admitting: Radiology

## 2016-09-09 DIAGNOSIS — N179 Acute kidney failure, unspecified: Secondary | ICD-10-CM

## 2016-09-09 LAB — CBC WITH DIFFERENTIAL/PLATELET
Band Neutrophils: 2 %
Basophils Absolute: 0 10*3/uL (ref 0.0–0.1)
Basophils Relative: 0 %
Blasts: 0 %
EOS PCT: 0 %
Eosinophils Absolute: 0 10*3/uL (ref 0.0–0.7)
HEMATOCRIT: 31.1 % — AB (ref 39.0–52.0)
HEMOGLOBIN: 10.4 g/dL — AB (ref 13.0–17.0)
LYMPHS PCT: 5 %
Lymphs Abs: 2.1 10*3/uL (ref 0.7–4.0)
MCH: 29.9 pg (ref 26.0–34.0)
MCHC: 33.4 g/dL (ref 30.0–36.0)
MCV: 89.4 fL (ref 78.0–100.0)
MYELOCYTES: 3 %
Metamyelocytes Relative: 1 %
Monocytes Absolute: 31.5 10*3/uL — ABNORMAL HIGH (ref 0.1–1.0)
Monocytes Relative: 76 %
NEUTROS PCT: 13 %
NRBC: 1 /100{WBCs} — AB
Neutro Abs: 7.9 10*3/uL — ABNORMAL HIGH (ref 1.7–7.7)
OTHER: 0 %
PROMYELOCYTES ABS: 0 %
Platelets: 122 10*3/uL — ABNORMAL LOW (ref 150–400)
RBC: 3.48 MIL/uL — AB (ref 4.22–5.81)
RDW: 18.4 % — ABNORMAL HIGH (ref 11.5–15.5)
WBC: 41.5 10*3/uL — AB (ref 4.0–10.5)

## 2016-09-09 LAB — BASIC METABOLIC PANEL
Anion gap: 10 (ref 5–15)
BUN: 55 mg/dL — ABNORMAL HIGH (ref 6–20)
CALCIUM: 9.3 mg/dL (ref 8.9–10.3)
CO2: 25 mmol/L (ref 22–32)
CREATININE: 3.78 mg/dL — AB (ref 0.61–1.24)
Chloride: 99 mmol/L — ABNORMAL LOW (ref 101–111)
GFR calc non Af Amer: 13 mL/min — ABNORMAL LOW (ref >60)
GFR, EST AFRICAN AMERICAN: 15 mL/min — AB (ref >60)
GLUCOSE: 100 mg/dL — AB (ref 65–99)
Potassium: 4.1 mmol/L (ref 3.5–5.1)
Sodium: 134 mmol/L — ABNORMAL LOW (ref 135–145)

## 2016-09-09 LAB — CULTURE, BLOOD (ROUTINE X 2): CULTURE: NO GROWTH

## 2016-09-09 LAB — GLUCOSE, CAPILLARY
Glucose-Capillary: 110 mg/dL — ABNORMAL HIGH (ref 65–99)
Glucose-Capillary: 99 mg/dL (ref 65–99)
Glucose-Capillary: 170 mg/dL — ABNORMAL HIGH (ref 65–99)
Glucose-Capillary: 132 mg/dL — ABNORMAL HIGH (ref 65–99)

## 2016-09-09 LAB — URIC ACID
Uric Acid, Serum: 13.8 mg/dL — ABNORMAL HIGH (ref 4.4–7.6)
Uric Acid, Serum: 14.3 mg/dL — ABNORMAL HIGH (ref 4.4–7.6)

## 2016-09-09 LAB — BONE MARROW EXAM

## 2016-09-09 LAB — LACTATE DEHYDROGENASE: LDH: 268 U/L — AB (ref 98–192)

## 2016-09-09 MED ORDER — SODIUM CHLORIDE 0.9 % IV SOLN
INTRAVENOUS | Status: DC
  Administered 2016-09-09 – 2016-09-10 (×2): via INTRAVENOUS

## 2016-09-09 MED ORDER — FENTANYL CITRATE (PF) 100 MCG/2ML IJ SOLN
INTRAMUSCULAR | Status: AC
  Filled 2016-09-09: qty 4

## 2016-09-09 MED ORDER — MIDAZOLAM HCL 2 MG/2ML IJ SOLN
INTRAMUSCULAR | Status: AC | PRN
  Administered 2016-09-09 (×2): 1 mg via INTRAVENOUS

## 2016-09-09 MED ORDER — MIDAZOLAM HCL 2 MG/2ML IJ SOLN
INTRAMUSCULAR | Status: AC
  Filled 2016-09-09: qty 4

## 2016-09-09 MED ORDER — FENTANYL CITRATE (PF) 100 MCG/2ML IJ SOLN
INTRAMUSCULAR | Status: AC | PRN
  Administered 2016-09-09: 50 ug via INTRAVENOUS

## 2016-09-09 NOTE — Consult Note (Signed)
Chief Complaint: Patient was seen in consultation today for CT-guided bone marrow biopsy Chief Complaint  Patient presents with  . Fall  . Leg Swelling     Referring Physician(s): Mohamed,M  Supervising Physician: Marybelle Killings  Patient Status: [facility] - In-pt  History of Present Illness: Brian Mcmillan is a 82 y.o. male with significant past medical history including coronary artery disease, congestive heart failure, mild memory impairment, hypertension, dyslipidemia, chronic kidney disease, hypothyroidism, renal cell carcinoma with prior radiofrequency ablation at [facility], MGUS, and thrombocytopenia. He was previously followed by Dr. [name] at [facility] for several years for MGUS as well as lymphadenopathy secondary to fat necrosis in addition to the above noted renal cell carcinoma. He was recently admitted to [facility] with bilateral ankle pain and swelling as well as generalized weakness and is also undergoing treatment for acute on chronic systolic heart failure as well as new onset atrial fibrillation and cellulitis of the foot. He continues to have persistent leukocytosis and monocytosis on peripheral blood smear and request now received for CT-guided bone marrow biopsy for further evaluation.  Past Medical History:  Diagnosis Date  . Abdominal mass 2008   chronic inflammatory mass of the mesentary Dr [name]  . CAD (coronary artery disease)    cath 09/23/09 70% Dx1 (diffuse disease), 40% Cx, Large RCA 80-90% with heavy calcification / cath 06/2010 no change tiht RCA still best to treat medially with nosebleeds  . Chest pain    myoview 2006 no ischemia/ myoview 2009 no ischemia  . Dyslipidemia   . Hypertension    no RAS  . Hypothyroidism   . Kidney mass 2008   radiofrequency ablation at [facility]  . Left ventricular dysfunction    myoview 2006 normal / myoview 2009 normal / EF 60% cath 09/2009, EF 60% cath 06/2010  . Memory  impairment    memory decreasing 06/2010  . Nosebleed    significant, can not take ASA  . Past heart attack    X2  . Rash Sept 2011   rash on buttocks, hospitalized hydralazine stopped but then restarted w/o return of rash  . Systolic click    no mitral valve prolapse    Past Surgical History:  Procedure Laterality Date  . BACK SURGERY    . BONE MARROW ASPIRATION     X2  . BONE MARROW BIOPSY     X2  . COLONOSCOPY N/A 03/07/2013   Procedure: COLONOSCOPY;  Surgeon: [name], MD;  Location: [facility]O [facility]ITE;  Service: Endoscopy;  Laterality: N/A;  830-moved to 15 Ann to notify pt  . COLONOSCOPY  01/2013  . lymph node removal     right abdomen  . LYMPH NODE removal     robotic laser @ [facility]  on left side of stomach  . RFA RIGHT KIDNEY      Allergies: Ranolazine; Simvastatin; Aspirin; Contrast media [iodinated diagnostic agents]; Ibuprofen; and Ioxaglate  Medications: Prior to Admission medications   Medication Sig Start Date End Date Taking? Authorizing Provider  acetaminophen (TYLENOL) 500 MG tablet Take 500 mg by mouth every 6 (six) hours as needed for moderate pain.   Yes Historical Provider, MD  albuterol-ipratropium (COMBIVENT) 18-103 MCG/ACT inhaler Inhale 2-4 puffs into the lungs 2 (two) times daily as needed for wheezing.    Yes Historical Provider, MD  carvedilol (COREG) 6.25 MG tablet Take 6.25 mg by mouth 2 (two) times daily with a meal.  Yes Historical Provider, MD  Cholecalciferol (VITAMIN D3) 5000 UNITS CAPS Take 5,000 Units by mouth daily.    Yes Historical Provider, MD  fluticasone (FLONASE) 50 MCG/ACT nasal spray Place 2 sprays into both nostrils at bedtime as needed for allergies.  01/02/16  Yes Historical Provider, MD  furosemide (LASIX) 40 MG tablet Take 20 mg by mouth daily.    Yes Historical Provider, MD  ipratropium (ATROVENT) 0.03 % nasal spray Place 2 sprays into both nostrils 3 (three) times daily.  01/02/16  Yes Historical Provider, MD    levothyroxine (SYNTHROID, LEVOTHROID) 125 MCG tablet Take 125 mcg by mouth daily.    Yes Historical Provider, MD  montelukast (SINGULAIR) 10 MG tablet Take 10 mg by mouth at bedtime.    Yes Historical Provider, MD  multivitamin-iron-minerals-folic acid (THERAPEUTIC-M) TABS tablet Take 1 tablet by mouth daily.   Yes Historical Provider, MD  nitroGLYCERIN (NITRODUR - DOSED IN MG/24 HR) 0.6 mg/hr patch Place 0.6 mg onto the skin daily.   Yes Historical Provider, MD  nitroGLYCERIN (NITROSTAT) 0.4 MG SL tablet Place 1 tablet (0.4 mg total) under the tongue as needed. Patient taking differently: Place 0.4 mg under the tongue as needed for chest pain.  08/19/11  Yes Carlena Bjornstad, MD  Omega-3 Fatty Acids (FISH OIL) 1000 MG CPDR Take 1,000 mg by mouth daily.   Yes Historical Provider, MD  pantoprazole (PROTONIX) 40 MG tablet Take 1 tablet (40 mg total) by mouth daily before breakfast. 03/23/16  Yes [name], MD  pravastatin (PRAVACHOL) 20 MG tablet Take 20 mg by mouth at bedtime. 12/28/12  Yes Historical Provider, MD  tamsulosin (FLOMAX) 0.4 MG CAPS capsule Take 0.4 mg by mouth daily.   Yes Historical Provider, MD  tiotropium (SPIRIVA) 18 MCG inhalation capsule Place 18 mcg into inhaler and inhale daily.   Yes Historical Provider, MD  vitamin B-12 (CYANOCOBALAMIN) 1000 MCG tablet Take 1,000 mcg by mouth daily.     Yes Historical Provider, MD     Family History  Problem Relation Age of Onset  . Cancer Maternal Grandfather   . Coronary artery disease      Social History   Social History  . Marital status: Married    Spouse name: N/A  . Number of children: N/A  . Years of education: N/A   Occupational History  . Retired Retired   Social History Main Topics  . Smoking status: Former Smoker    Packs/day: 0.50    Years: 42.00    Types: Cigarettes    Quit date: 10/11/1980  . Smokeless tobacco: Never Used  . Alcohol use No  . Drug use: Unknown  . Sexual activity: Not Asked   Other  Topics Concern  . None   Social History Narrative  . None      Review of Systems early denies fever, headache, chest pain, abdominal pain, nausea, vomiting or abnormal bleeding. He does have some dyspnea with exertion, cough, fatigue and weakness.  Vital Signs: BP (!) 123/49 (BP Location: Right Arm)   Pulse 64   Temp 98.4 F (36.9 C) (Oral)   Resp 18   Ht 6' 2"  (1.88 m)   Wt 196 lb 1.6 oz (89 kg)   SpO2 94%   BMI 25.18 kg/m   Physical Exam patient awake, alert. Chest clear to auscultation bilaterally anteriorly. Heart with regular rate and rhythm. Abdomen soft, positive bowel sounds, nontender. Left lower leg/ankle region with mild erythema and swelling.  Mallampati Score:     Imaging: Dg Chest 2 View  Result Date: 09/03/2016 CLINICAL DATA:  Bilateral ankle swelling worse over the past week. History of coronary artery disease and hypertension. Former smoker. Cough and fever. EXAM: CHEST  2 VIEW COMPARISON:  11/28/2015 FINDINGS: Shallow inspiration. Mild cardiac enlargement with pulmonary vascular congestion. Slight interstitial pattern to the lung bases may represent early interstitial edema. No focal consolidation. No blunting of costophrenic angles. No pneumothorax. Calcified and tortuous aorta. Degenerative changes in the spine. IMPRESSION: Mild congestive changes in the heart and lungs with mild interstitial edema. No effusion or consolidation. Electronically Signed   By: Lucienne Capers M.D.   On: 09/03/2016 21:43   Dg Ankle Complete Left  Result Date: 09/03/2016 CLINICAL DATA:  Ankle swelling EXAM: LEFT ANKLE COMPLETE - 3+ VIEW COMPARISON:  08/03/2016 FINDINGS: Avulsion injury medial malleolus as before, probably chronic. Slight lucency in the lateral talar dome. Probable old avulsion injury fibular malleolus. Tiny os ossific densities project over medial joint space, cannot  exclude tiny loose bodies. Soft tissue swelling, moderate. Mild cortical irregularity of the anterior inferior tibia, partially related to degenerative changes. IMPRESSION: 1. Moderate soft tissue swelling. No definite acute osseous abnormality. 2. Probable old avulsion injuries of the medial and lateral malleolus. 3. Cortical irregularity of the anterior inferior tibia, likely on the basis of degenerative changes. 4. Questionable OCD lesion lateral tailor dome. Electronically Signed   By: Donavan Foil M.D.   On: 09/03/2016 21:28   Dg Ankle Complete Right  Result Date: 09/03/2016 CLINICAL DATA:  Bilateral ankle swelling, worse over the past week. Patient is on a diuretic. Fall. EXAM: RIGHT ANKLE - COMPLETE 3+ VIEW COMPARISON:  Right foot 04/10/2005 FINDINGS: Focal linear lucency and cortical irregularity at the medial malleolus may represent avulsion fracture. Medial soft tissue swelling about the right ankle. No acute displaced fractures are identified. Degenerative changes in the right ankle and intertarsal joints. No focal bone lesion or bone destruction. Vascular calcifications in the soft tissues. IMPRESSION: Focal cortical changes of the medial malleolus may indicate an avulsion fracture. Soft tissue swelling. Degenerative changes. Electronically Signed   By: Lucienne Capers M.D.   On: 09/03/2016 21:26   Dg Foot Complete Left  Result Date: 09/03/2016 CLINICAL DATA:  Bilateral ankle swelling worse over the past week. Fall. EXAM: LEFT FOOT - COMPLETE 3+ VIEW COMPARISON:  None. FINDINGS: Soft tissue swelling over the left forefoot. Mild degenerative changes in the intertarsal and first metatarsal-phalangeal joints. No acute fracture or dislocation. No focal bone lesion or bone destruction. IMPRESSION: Mild soft tissue swelling. Degenerative changes. No acute bony abnormalities. Electronically Signed   By: Lucienne Capers M.D.   On: 09/03/2016 21:29   Dg Foot Complete Right  Result Date:  09/03/2016 CLINICAL DATA:  Bilateral ankle swelling, worse over the past week. Fall. EXAM: RIGHT FOOT COMPLETE - 3+ VIEW COMPARISON:  Right foot 04/10/2005 FINDINGS: Soft tissue swelling over the dorsum of the right foot. Mild degenerative changes in the intertarsal joints. No evidence of acute fracture or dislocation. Vague focal lucency in the calcaneus is unchanged since prior study and probably represents a small bone cyst or lipoma. No destructive or expansile bone lesions. IMPRESSION: Dorsal soft tissue swelling over the right foot. Mild degenerative changes. No acute bony abnormalities. Electronically Signed   By: Lucienne Capers M.D.   On: 09/03/2016 21:32   Dg Hip Unilat With Pelvis 2-3 Views Left  Result Date: 09/03/2016 CLINICAL DATA:  Hip pain.  Injury. EXAM: DG HIP (WITH OR WITHOUT PELVIS) 2-3V LEFT COMPARISON:  None. FINDINGS: Degenerative changes lumbar spine and both hips. No acute bony abnormality. No evidence of fracture or dislocation. Aortoiliac atherosclerotic vascular calcification. Stable calcific densities noted over the left abdomen. Surgical clips in the pelvis. IMPRESSION: 1. No acute bony or joint abnormality. Degenerative changes lumbar spine and both hips. No evidence of fracture. 2. Aortoiliac atherosclerotic vascular disease . Electronically Signed   By: Marcello Moores  Register   On: 09/03/2016 16:38    Labs:  CBC:  Recent Labs  09/06/16 0521 09/07/16 0556 09/08/16 0544 09/09/16 0534  WBC 29.4* 37.2* 37.2* 41.5*  HGB 11.2* 11.4* 10.8* 10.4*  HCT 33.3* 34.5* 32.2* 31.1*  PLT 107* 127* 115* 122*    COAGS:  Recent Labs  09/04/16 0141  INR 1.31  APTT 47*    BMP:  Recent Labs  09/06/16 0521 09/07/16 0556 09/08/16 0544 09/09/16 0534  NA 136 135 136 134*  K 4.2 4.7 4.4 4.1  CL 100* 99* 101 99*  CO2 26 24 25 25   GLUCOSE 104* 125* 93 100*  BUN 35* 42* 49* 55*  CALCIUM 9.1 9.5 9.3 9.3  CREATININE 2.42* 3.22* 3.69* 3.78*  GFRNONAA 22* 16* 13* 13*    GFRAA 26* 18* 15* 15*    LIVER FUNCTION TESTS: No results for input(s): BILITOT, AST, ALT, ALKPHOS, PROT, ALBUMIN in the last 8760 hours.  TUMOR MARKERS: No results for input(s): AFPTM, CEA, CA199, CHROMGRNA in the last 8760 hours.  Assessment and Plan: 82 y.o. male with significant past medical history including coronary artery disease, congestive heart failure, mild memory impairment, hypertension, dyslipidemia, chronic kidney disease, hypothyroidism, renal cell carcinoma with prior radiofrequency ablation at [facility], MGUS, and thrombocytopenia. He was previously followed by Dr. [name] at [facility] for several years for MGUS as well as lymphadenopathy secondary to fat necrosis in addition to the above noted renal cell carcinoma. He was recently admitted to [facility] with bilateral ankle pain and swelling as well as generalized weakness and is also undergoing treatment for acute on chronic systolic heart failure as well as new onset atrial fibrillation and cellulitis of the foot. He continues to have persistent leukocytosis and monocytosis on peripheral blood smear and request now received for CT-guided bone marrow biopsy for further evaluation.Risks and benefits discussed with the patient/wife including, but not limited to bleeding, infection, damage to adjacent structures or low yield requiring additional tests.All of the patient's questions were answered, patient is agreeable to proceed. Consent signed and in chart. Procedure scheduled for this morning.     Thank you for this interesting consult.  I greatly enjoyed meeting GEOFFREY HYNES and look forward to participating in their care.  A copy of this report was sent to the requesting provider on this date.  Electronically Signed: D. Rowe Robert 09/09/2016, 8:44 AM   I spent a total of  25 minutes   in face to face in clinical consultation, greater than 50% of which was  counseling/coordinating care for CT-guided bone marrow biopsy

## 2016-09-09 NOTE — Progress Notes (Signed)
 PROGRESS NOTE  Brian Mcmillan MRN:[id number] DOB: 04/19/1925 DOA: 09/03/2016 PCP: FUSCO,LAWRENCE J., MD  HPI/Recap of past 24 hours:  82-year-old male with past mental history of ischemic heart disease and chronic systolic CHF with ejection fraction of 45% as well as history of renal cancer status post ablation who was admitted on 11/24 after having bilateral ankle pain 3 weeks and had several falls over the past month. At that time, patient reported a fever at home. Emergency room, patient was afebrile but noted to have a significant leukocytosis at 22. He was also found to have new onset atrial fibrillation. There was a? Of right foot cellulitis due to edematous appearance and soft tissue swelling on x-ray and patient was started on antibiotics. Over the next few days, patient has clinically improved. He was also found to be in mild systolic heart failure and started on diuretics by cardiology. His white count however has continued to trend upward and is currently at 37. Peripheral smear noted by pathology a primary leukocytosis with atypical monocytes. Hematology consulted and patient underwent bone marrow biopsy on morning of 11/30.  Today, patient seen right after biopsy. Fatigued, ankle surgery little. Otherwise he has no complaints. Breathing comfortably. Creatinine into new to trend upwards although Lasix has been held now 2 days  Assessment/Plan: Principal Problem:    Hypothyroidism, acquired continue Synthroid   Essential hypertension   Coronary artery disease due to lipid rich plaque   Acute on stage III chronic kidney failure (HCC): Creatinine continues to trend upward, likely in the setting of diuresis. Lasix on hold. Have started very gentle hydration   Thrombocytopenia (HCC)      Acute on chronic combined systolic and diastolic CHF, NYHA class 2 (HCC): Patient has responded well to Lasix, diuresing over 70 L since admission and weight down 5 pounds. His creatinine has jumped up  some so Lasix has been on hold since 11/28.    Paroxysmal atrial fibrillation (HCC) new-onset: Asymptomatic at this time and has since converted to normal sinus rhythm. His chads 2 score is at 5. Patient is at a previous history of severe nosebleeds and does not want to discuss any anticoagulation. Continue Coreg which has been titrated up    Leukocytosis: principal problem. Given peripheral smear, concerning for some cell disorder. Suspect myeloproliferative disorder. Awaiting pathology from bone marrow biopsy. Appreciate hematology help.    Lobar pneumonia, unspecified organism (HCC): On empiric Levaquin since 11/26, stopped today. Blood cultures with no growth.   Troponin level elevated: No evidence of ischemia, felt to be secondary to elevated heart rate   Code Status: Full code   Family Communication: Wife at the bedside   Disposition Plan: Awaiting pathology from biopsy as well as creatinine to improve, anticipate discharge in a few days   Consultants:  Hematology  Cardiology   Procedures:  None   Antimicrobials:   Ancef 11/24-11/28  Levaquin 11/26-11/30  DVT prophylaxis: SCDs   Objective: Vitals:   09/09/16 1051 09/09/16 1105 09/09/16 1138 09/09/16 1207  BP: (!) 130/52 (!) 143/58 (!) 128/50 (!) 134/59  Pulse: (!) 59 (!) 57 (!) 56 (!) 58  Resp: 16 16 16 18  Temp: 97.8 F (36.6 C) 97.3 F (36.3 C) 97.2 F (36.2 C) 97.6 F (36.4 C)  TempSrc: Oral Oral Oral Oral  SpO2: 93% 97% 94% 96%  Weight:      Height:        Intake/Output Summary (Last 24 hours) at 09/09/16 1306 Last data filed   at 09/09/16 1217  Gross per 24 hour  Intake                0 ml  Output             2810 ml  Net            -2810 ml   Filed Weights   09/07/16 0500 09/08/16 0627 09/09/16 0606  Weight: 90.4 kg (199 lb 4.7 oz) 89.6 kg (197 lb 8 oz) 89 kg (196 lb 1.6 oz)    Exam:   General:  Alert and oriented 3, Fatigue  Cardiovascular: Regular rate and rhythm, S1-S2    Respiratory: Clear to auscultation bilaterally   Abdomen: Soft, nontender, nondistended, positive bowel sounds   Musculoskeletal: No clubbing or cyanosis, trace pitting edema   Skin: Significant bruising on the lateral aspect of the left ankle  Psychiatry: Patient appropriate, no evidence of psychoses    Data Reviewed: CBC:  Recent Labs Lab 09/03/16 1605  09/05/16 0544 09/06/16 0521 09/07/16 0556 09/08/16 0544 09/09/16 0534  WBC 27.1*  < > 23.1* 29.4* 37.2* 37.2* 41.5*  NEUTROABS 1.9  --   --   --   --   --  7.9*  HGB 12.0*  < > 10.3* 11.2* 11.4* 10.8* 10.4*  HCT 35.2*  < > 31.0* 33.3* 34.5* 32.2* 31.1*  MCV 88.0  < > 89.9 88.1 89.8 89.7 89.4  PLT 101*  < > 100* 107* 127* 115* 122*  < > = values in this interval not displayed. Basic Metabolic Panel:  Recent Labs Lab 09/03/16 2314  09/05/16 0544 09/06/16 0521 09/07/16 0556 09/08/16 0544 09/09/16 0534  NA  --   < > 141 136 135 136 134*  K  --   < > 4.0 4.2 4.7 4.4 4.1  CL  --   < > 106 100* 99* 101 99*  CO2  --   < > _0 GLUCOSE  --   < > 100* 104* 125* 93 100*  BUN  --   < > 36* 35* 42* 49* 55*  CREATININE  --   < > 2.31* 2.42* 3.22* 3.69* 3.78*  CALCIUM  --   < > 9.0 9.1 9.5 9.3 9.3  MG 1.8  --   --   --   --  1.8  --   < > = values in this interval not displayed. GFR: Estimated Creatinine Clearance: 15.4 mL/min (by C-G formula based on SCr of 3.78 mg/dL (H)). Liver Function Tests: No results for input(s): AST, ALT, ALKPHOS, BILITOT, PROT, ALBUMIN in the last 168 hours. No results for input(s): LIPASE, AMYLASE in the last 168 hours. No results for input(s): AMMONIA in the last 168 hours. Coagulation Profile:  Recent Labs Lab 09/04/16 0141  INR 1.31   Cardiac Enzymes:  Recent Labs Lab 09/03/16 2314 09/04/16 0535 09/04/16 1010 09/04/16 1620  TROPONINI 0.79* 0.47* 0.54* 0.62*   BNP (last 3 results) No results for input(s): PROBNP in the last 8760 hours. HbA1C: No results for  input(s): HGBA1C in the last 72 hours. CBG:  Recent Labs Lab 09/08/16 1209 09/08/16 1753 09/08/16 2150 09/09/16 0801 09/09/16 1145  GLUCAP 178* 126* 168* 110* 99   Lipid Profile: No results for input(s): CHOL, HDL, LDLCALC, TRIG, CHOLHDL, LDLDIRECT in the last 72 hours. Thyroid Function Tests: No results for input(s): TSH, T4TOTAL, FREET4, T3FREE, THYROIDAB in the last 72 hours. Anemia Panel: No results for input(s): VITAMINB12, FOLATE,  FERRITIN, TIBC, IRON, RETICCTPCT in the last 72 hours. Urine analysis:    Component Value Date/Time   COLORURINE YELLOW 09/03/2016 2030   APPEARANCEUR CLOUDY (A) 09/03/2016 2030   LABSPEC 1.020 09/03/2016 2030   PHURINE 5.0 09/03/2016 2030   GLUCOSEU NEGATIVE 09/03/2016 2030   HGBUR SMALL (A) 09/03/2016 2030   [place] NEGATIVE 09/03/2016 2030   [place] 09/03/2016 2030   PROTEINUR 30 (A) 09/03/2016 2030   UROBILINOGEN 0.2 12/04/2014 2042   NITRITE NEGATIVE 09/03/2016 2030   LEUKOCYTESUR NEGATIVE 09/03/2016 2030   Sepsis Labs: _0 (procalcitonin:4,lacticidven:4)  ) Recent Results (from the past 240 hour(s))  Culture, blood (routine x 2)     Status: None   Collection Time: 09/03/16 10:29 PM  Result Value Ref Range Status   Specimen Description BLOOD RIGHT ANTECUBITAL  Final   Special Requests BOTTLES DRAWN AEROBIC AND ANAEROBIC 5 CC  Final   Culture   Final    NO GROWTH 5 DAYS Performed at [facility]    Report Status 09/08/2016 FINAL  Final  Culture, blood (routine x 2)     Status: None (Preliminary result)   Collection Time: 09/03/16 10:30 PM  Result Value Ref Range Status   Specimen Description BLOOD LEFT ANTECUBITAL  Final   Special Requests IN PEDIATRIC BOTTLE  4CC  Final   Culture   Final    NO GROWTH 4 DAYS Performed at [facility]    Report Status PENDING  Incomplete      Studies: Ct Biopsy  Result Date: 23-Sep-2016 INDICATION: Leukocytosis EXAM: CT BIOPSY MEDICATIONS: None.  ANESTHESIA/SEDATION: Fentanyl 50 mcg IV; Versed 2 mg IV Moderate Sedation Time:  14 The patient was continuously monitored during the procedure by the interventional radiology nurse under my direct supervision. FLUOROSCOPY TIME:  Fluoroscopy Time:  minutes  seconds ( mGy). COMPLICATIONS: None immediate. PROCEDURE: Informed written consent was obtained from the patient after a thorough discussion of the procedural risks, benefits and alternatives. All questions were addressed. Maximal Sterile Barrier Technique was utilized including caps, mask, sterile gowns, sterile gloves, sterile drape, hand hygiene and skin antiseptic. A timeout was performed prior to the initiation of the procedure. Under CT guidance, a(n) 11 gauge guide needle was advanced into the right iliac bone via posterior approach. Aspirates and a core were obtain. Post biopsy images demonstrate no hemorrhage. Patient tolerated the procedure well without complication. Vital sign monitoring by nursing staff during the procedure will continue as patient is in the special procedures unit for post procedure observation. FINDINGS: The images document guide needle placement within the right iliac bone. Post biopsy images demonstrate no hemorrhage. IMPRESSION: Successful CT-guided bone marrow aspirate and core from the right iliac bone. Electronically Signed   By: Marybelle Killings M.D.   On: 09-23-16 11:41    Scheduled Meds: . benzonatate  100 mg Oral BID  . carvedilol  6.25 mg Oral BID WC  . fentaNYL      . guaiFENesin  600 mg Oral BID  . insulin aspart  0-5 Units Subcutaneous QHS  . insulin aspart  0-9 Units Subcutaneous TID WC  . levothyroxine  125 mcg Oral QAC breakfast  . midazolam      . montelukast  10 mg Oral QHS  . nitroGLYCERIN  0.6 mg Transdermal Daily  . pantoprazole  40 mg Oral QAC breakfast  . pravastatin  20 mg Oral QHS  . tamsulosin  0.4 mg Oral Daily    Continuous Infusions: . sodium chloride  LOS: 6 days      Annita Brod, MD [facility] Pager [phone]  If 7PM-7AM, please contact night-coverage [email or web address] Password [email or web address] 09/09/2016, 1:06 PM

## 2016-09-09 NOTE — Care Management Note (Signed)
Case Management Note  Patient Details  Name: SCHAFER STUDER MRN: [id number] Date of Birth: 07-17-1925  Subjective/Objective: PT-recc HHPT. Spouse chose [facility]-rep Cheryl aware & following for d/c & HHPT,f68f order. Patient/spouse already informed about scooter(to f/u w/pcp-an otpt PT process)spouse voiced understanding.                   Action/Plan:d/c home w/HHPT.   Expected Discharge Date:                  Expected Discharge Plan:  [place]  In-House Referral:     Discharge planning Services  CM Consult  Post Acute Care Choice:  Durable Medical Equipment (w/c) Choice offered to:  Patient  DME Arranged:    DME Agency:     HH Arranged:    HH Agency:  [place]  Status of Service:  In process, will continue to follow  If discussed at Long Length of Stay Meetings, dates discussed:    Additional Comments:  Dessa Phi, RN 09/09/2016, 12:23 PM

## 2016-09-09 NOTE — Progress Notes (Signed)
Physical Therapy Treatment Patient Details Name: Brian Mcmillan MRN: [id number] DOB: November 29, 1924 Today's Date: 09/09/2016    History of Present Illness Brian Mcmillan is a 82 y.o. male with a past medical history significant for ischemic heart disease and CHF EF 45%, HTN, hypothyroidism, renal CA s/p ablation, hx of TIA, MGUS and NIDDM who presents 11/24/17with bilateral ankle pain for three weeks and is found to have elevated creatinine, new onset Afib, elevated troponin,  Acute on chronic systolic and diastolic CHF (congestive heart failure) ([place]) / Acute respiratory failure with hypoxia    PT Comments    Assisted OOB to amb a functional distance.  No c/o pain.  Used RW under direction.   Avg HR 82 with activity.     Follow Up Recommendations  Home health PT;Supervision/Assistance - 24 hour     Equipment Recommendations  None recommended by PT    Recommendations for Other Services       Precautions / Restrictions Precautions Precautions: Fall Precaution Comments: monitor HR    Mobility  Bed Mobility Overal bed mobility: Needs Assistance Bed Mobility: Supine to Sit     Supine to sit: Supervision;Min guard     General bed mobility comments: extra time, gradually patient was able to self assist to  edge of bed .  Transfers Overall transfer level: Needs assistance Equipment used: None Transfers: Sit to/from Stand Sit to Stand: Min guard;Min assist Stand pivot transfers: Min guard;Min assist       General transfer comment: 25% Vc's on proper hand placement and increased time to rise.  Pt stated, "I'm slow".   Ambulation/Gait Ambulation/Gait assistance: Min assist;Min guard Ambulation Distance (Feet): 55 Feet Assistive device: Rolling walker (2 wheeled) Gait Pattern/deviations: Step-to pattern;Step-through pattern;Narrow base of support;Trunk flexed Gait velocity: decreased   General Gait Details: verys slow speed, gait is steady. HR 82   Stairs             Wheelchair Mobility    Modified Rankin (Stroke Patients Only)       Balance                                    Cognition Arousal/Alertness: Awake/alert Behavior During Therapy: WFL for tasks assessed/performed Overall Cognitive Status: Within Functional Limits for tasks assessed                      Exercises      General Comments        Pertinent Vitals/Pain Pain Assessment: No/denies pain    Home Living                      Prior Function            PT Goals (current goals can now be found in the care plan section) Progress towards PT goals: Progressing toward goals    Frequency    Min 3X/week      PT Plan Current plan remains appropriate    Co-evaluation             End of Session Equipment Utilized During Treatment: Gait belt Activity Tolerance: Patient tolerated treatment well Patient left: in chair;with call bell/phone within reach;with nursing/sitter in room;with chair alarm set;with family/visitor present     Time: [id number] PT Time Calculation (min) (ACUTE ONLY): 13 min  Charges:  $Gait Training: 8-22 mins  G Codes:      Rica Koyanagi  PTA [facility]  Acute  Rehab Pager      [phone]

## 2016-09-09 NOTE — Progress Notes (Signed)
Patient Name: Brian Mcmillan Date of Encounter: 09/09/2016  Primary Cardiologist: Dr. Alethia Berthold [facility] Problem List     Principal Problem:   Acute on chronic systolic CHF (congestive heart failure) (HCC) Active Problems:   Hypothyroidism, acquired   Essential hypertension   Coronary artery disease due to lipid rich plaque   Acute on chronic kidney failure (HCC)   Thrombocytopenia (HCC)   Cellulitis   Acute on chronic combined systolic and diastolic CHF, NYHA class 2 (HCC)   Paroxysmal atrial fibrillation (HCC)   Leukocytosis   Lobar pneumonia, unspecified organism ([place])   Troponin level elevated    Subjective   Patient has been NPO since midnight for bone marrow biopsy. Denies any chest discomfort or palpitations.   Inpatient Medications    Scheduled Meds: . benzonatate  100 mg Oral BID  . carvedilol  6.25 mg Oral BID WC  . guaiFENesin  600 mg Oral BID  . insulin aspart  0-5 Units Subcutaneous QHS  . insulin aspart  0-9 Units Subcutaneous TID WC  . levofloxacin  500 mg Oral Q48H  . levothyroxine  125 mcg Oral QAC breakfast  . montelukast  10 mg Oral QHS  . nitroGLYCERIN  0.6 mg Transdermal Daily  . pantoprazole  40 mg Oral QAC breakfast  . pravastatin  20 mg Oral QHS  . tamsulosin  0.4 mg Oral Daily   Continuous Infusions:  PRN Meds: acetaminophen **OR** acetaminophen, ipratropium-albuterol, ondansetron **OR** ondansetron (ZOFRAN) IV, traMADol   Vital Signs    Vitals:   09/08/16 0627 09/08/16 0744 09/08/16 2153 09/09/16 0606  BP: (!) 132/49  (!) 128/45 (!) 123/49  Pulse: 64 63 (!) 58 64  Resp: 18 18 18 18   Temp: 97.8 F (36.6 C)  98.7 F (37.1 C) 98.4 F (36.9 C)  TempSrc: Oral  Oral Oral  SpO2: 96% 95% 96% 94%  Weight: 197 lb 8 oz (89.6 kg)   196 lb 1.6 oz (89 kg)  Height:        Intake/Output Summary (Last 24 hours) at 09/09/16 0801 Last data filed at 09/09/16 0310  Gross per 24 hour  Intake              240 ml  Output              2710 ml  Net            -2470 ml   Filed Weights   09/07/16 0500 09/08/16 0627 09/09/16 0606  Weight: 199 lb 4.7 oz (90.4 kg) 197 lb 8 oz (89.6 kg) 196 lb 1.6 oz (89 kg)    Physical Exam    GEN: Well nourished, well developed Caucasian male appearing in no acute distress.  HEENT: Grossly normal.  Neck: Supple, no JVD, carotid bruits, or masses. Cardiac: RRR, no murmurs, rubs, or gallops. No clubbing, cyanosis, edema.  Radials/DP/PT 2+ and equal bilaterally.  Respiratory:  Respirations regular and unlabored, clear to auscultation bilaterally. GI: Soft, nontender, nondistended, BS + x 4. MS: no deformity or atrophy. Skin: warm and dry, left ankle swollen with erythema present (improving from previous exam).   Neuro:  Strength and sensation are intact. Psych: AAOx3.  Normal affect.  Labs    CBC  Recent Labs  09/08/16 0544 09/09/16 0534  WBC 37.2* 41.5*  NEUTROABS  --  7.9*  HGB 10.8* 10.4*  HCT 32.2* 31.1*  MCV 89.7 89.4  PLT 115* 390*   Basic Metabolic Panel  Recent Labs  09/07/16  2633 09/08/16 0544  NA 135 136  K 4.7 4.4  CL 99* 101  CO2 24 25  GLUCOSE 125* 93  BUN 42* 49*  CREATININE 3.22* 3.69*  CALCIUM 9.5 9.3  MG  --  1.8     Telemetry    NSR, HR in 60-70's. Infrequent episodes of ventricular bigeminy - Personally Reviewed  ECG    No new tracings.   Radiology    No results found.  Cardiac Studies   Echocardiogram: 09/04/2016 Study Conclusions  - Left ventricle: The cavity size was at the upper limits of normal. Wall thickness was normal. The estimated ejection fraction was in the range of 45% to 50%. Hypokinesis of the basalinferolateral and inferior myocardium. Features are consistent with a pseudonormal left ventricular filling pattern, with concomitant abnormal relaxation and increased filling pressure (grade 2 diastolic dysfunction). Acoustic contrast opacification revealed no evidence ofthrombus. - Mitral valve:  There was mild regurgitation. - Left atrium: The atrium was mildly dilated.  Patient Profile     82 yo male w/ PMH of CAD, chronic combined systolic and diastolic CHF, HTN, HLD, and history of severe epistaxis with antiplatelet/anticoagulation admitted with CHF exacerbation, PNA, and new onset atrial fibrillation.   Assessment & Plan    1. New Onset Atrial fibrillation - newly recognized this admission. Asymptomatic at that time. Has since converted to NSR.  - This patients CHA2DS2-VASc Score and unadjusted Ischemic Stroke Rate (% per year) is equal to 7.2 % stroke rate/year from a score of 5 (CHF, HTN, Vascular, Age (2)). He refuses to consider anti-coagulation with his history of severe epistaxis. He could consider the WATCHMAN device if he would be willing to use coumadin for one month. Will need to discuss further with his Primary Cardiologist.  - continue Coreg 6.25m BID.   2. Acute on chronic systolic and diastolic CHF - LVEF 435-45%by echo 09/04/16. He has diuresed -9.5L this admission. Weight down 6 lbs since admission. With stark increase in creatinine (2.42 --> 3.22 -->3.69), will continue to hold Lasix today (was given dose of IV Lasix during AM of 09/07/2016). BMET not yet obtained today. Will add-on to AM labs. On PO Lasix 287mdaily PTA. Would not resume until we can assess his kidney function. Continue BB. No ACE-I/ARB with renal insufficiency.   3. Elevated troponin - Likely due to elevated HR on admission. No angina to suggest ACS. EKG and echo suggestive of prior inferior MI. Will not plan ischemic evaluation this admission.   4. HTN - BP well-controlled at 123/45 - 128/49 in the past 24 hours.   5. Leukocytosis - WBC trending upwards to 41.5 on 09/09/2016.  - Hematology consulted. Planning for bone marrow biopsy and aspirate today.  6. Left Ankle Swelling - Significant erythema along left ankle. Uric Acid elevated to 13.8.  - per admitting team  7. Acute on  Chronic Stage 4 CKD - creatinine 2.53 on admission, up to 3.69 on 11/29. Lasix held as above.   Signed, BrErma HeritagePA  09/09/2016, 8:01 AM   I have personally seen and examined this patient with BrBernerd PhoPA-C. I agree with the assessment and plan as outlined above. He is in sinus today. Continue Coreg. He refuses anti-coagulation. Volume status is much better but renal function has worsened. Lasix on hold today. BMET pending. Would continue to hold lasix until renal function improves.   ChLauree Chandler1/30/2017 11:13 AM

## 2016-09-09 NOTE — Procedures (Signed)
BM aspirate R iliac No comp/EBL

## 2016-09-09 NOTE — Sedation Documentation (Signed)
Patient is resting comfortably. 

## 2016-09-10 DIAGNOSIS — D630 Anemia in neoplastic disease: Secondary | ICD-10-CM

## 2016-09-10 DIAGNOSIS — R531 Weakness: Secondary | ICD-10-CM

## 2016-09-10 DIAGNOSIS — E039 Hypothyroidism, unspecified: Secondary | ICD-10-CM

## 2016-09-10 DIAGNOSIS — R040 Epistaxis: Secondary | ICD-10-CM

## 2016-09-10 DIAGNOSIS — C92A0 Acute myeloid leukemia with multilineage dysplasia, not having achieved remission: Secondary | ICD-10-CM

## 2016-09-10 DIAGNOSIS — C9200 Acute myeloblastic leukemia, not having achieved remission: Secondary | ICD-10-CM | POA: Diagnosis present

## 2016-09-10 DIAGNOSIS — R5383 Other fatigue: Secondary | ICD-10-CM

## 2016-09-10 DIAGNOSIS — E790 Hyperuricemia without signs of inflammatory arthritis and tophaceous disease: Secondary | ICD-10-CM

## 2016-09-10 DIAGNOSIS — E119 Type 2 diabetes mellitus without complications: Secondary | ICD-10-CM

## 2016-09-10 LAB — GLUCOSE, CAPILLARY
GLUCOSE-CAPILLARY: 107 mg/dL — AB (ref 65–99)
GLUCOSE-CAPILLARY: 99 mg/dL (ref 65–99)
GLUCOSE-CAPILLARY: 174 mg/dL — AB (ref 65–99)
Glucose-Capillary: 148 mg/dL — ABNORMAL HIGH (ref 65–99)

## 2016-09-10 LAB — BASIC METABOLIC PANEL
Anion gap: 12 (ref 5–15)
BUN: 57 mg/dL — AB (ref 6–20)
CALCIUM: 9.1 mg/dL (ref 8.9–10.3)
CHLORIDE: 100 mmol/L — AB (ref 101–111)
CO2: 24 mmol/L (ref 22–32)
CREATININE: 3.64 mg/dL — AB (ref 0.61–1.24)
GFR, EST AFRICAN AMERICAN: 16 mL/min — AB (ref >60)
GFR, EST NON AFRICAN AMERICAN: 14 mL/min — AB (ref >60)
Glucose, Bld: 111 mg/dL — ABNORMAL HIGH (ref 65–99)
Potassium: 3.5 mmol/L (ref 3.5–5.1)
SODIUM: 136 mmol/L (ref 135–145)

## 2016-09-10 MED ORDER — HYDROXYUREA 500 MG PO CAPS
500.0000 mg | ORAL_CAPSULE | Freq: Two times a day (BID) | ORAL | Status: DC
  Administered 2016-09-10 – 2016-09-12 (×4): 500 mg via ORAL
  Filled 2016-09-10 (×5): qty 1

## 2016-09-10 MED ORDER — ALLOPURINOL 100 MG PO TABS
100.0000 mg | ORAL_TABLET | Freq: Two times a day (BID) | ORAL | Status: DC
  Administered 2016-09-10 – 2016-09-13 (×7): 100 mg via ORAL
  Filled 2016-09-10 (×7): qty 1

## 2016-09-10 NOTE — Progress Notes (Signed)
Subjective: The patient is seen and examined today. His wife was at the bedside. He continues to complain of fatigue and weakness. He also had the swelling and erythema of the lower extremities. He has no current fever or chills. He underwent CT-guided bone marrow biopsy and aspirate 2 days ago and unfortunately the final pathology showed acute myeloid leukemia. I ordered LDH and uric acid and they are are elevated.  Objective: Vital signs in last 24 hours: Temp:  [97.7 F (36.5 C)-99 F (37.2 C)] 97.7 F (36.5 C) (12/01 1412) Pulse Rate:  [57-64] 64 (12/01 1412) Resp:  [16-20] 16 (12/01 1412) BP: (117-124)/(50-57) 123/52 (12/01 1412) SpO2:  [94 %-98 %] 94 % (12/01 1412) Weight:  [192 lb 11.2 oz (87.4 kg)] 192 lb 11.2 oz (87.4 kg) (12/01 0554)  Intake/Output from previous day: 11/30 0701 - 12/01 0700 In: 1018.3 [P.O.:240; I.V.:778.3] Out: 350 [Urine:350] Intake/Output this shift: Total I/O In: 270 [P.O.:120; I.V.:150] Out: 200 [Urine:200]  General appearance: alert, cooperative, fatigued and no distress Resp: clear to auscultation bilaterally Cardio: regular rate and rhythm, S1, S2 normal, no murmur, click, rub or gallop GI: soft, non-tender; bowel sounds normal; no masses,  no organomegaly Extremities: edema 1+, erythema.    Lab Results:   Recent Labs  09/08/16 0544 09/09/16 0534  WBC 37.2* 41.5*  HGB 10.8* 10.4*  HCT 32.2* 31.1*  PLT 115* 122*   BMET  Recent Labs  09/09/16 0534 09/10/16 0451  NA 134* 136  K 4.1 3.5  CL 99* 100*  CO2 25 24  GLUCOSE 100* 111*  BUN 55* 57*  CREATININE 3.78* 3.64*  CALCIUM 9.3 9.1    Studies/Results: Ct Biopsy  Result Date: 09/09/2016 INDICATION: Leukocytosis EXAM: CT BIOPSY MEDICATIONS: None. ANESTHESIA/SEDATION: Fentanyl 50 mcg IV; Versed 2 mg IV Moderate Sedation Time:  14 The patient was continuously monitored during the procedure by the interventional radiology nurse under my direct supervision. FLUOROSCOPY TIME:   Fluoroscopy Time:  minutes  seconds ( mGy). COMPLICATIONS: None immediate. PROCEDURE: Informed written consent was obtained from the patient after a thorough discussion of the procedural risks, benefits and alternatives. All questions were addressed. Maximal Sterile Barrier Technique was utilized including caps, mask, sterile gowns, sterile gloves, sterile drape, hand hygiene and skin antiseptic. A timeout was performed prior to the initiation of the procedure. Under CT guidance, a(n) 11 gauge guide needle was advanced into the right iliac bone via posterior approach. Aspirates and a core were obtain. Post biopsy images demonstrate no hemorrhage. Patient tolerated the procedure well without complication. Vital sign monitoring by nursing staff during the procedure will continue as patient is in the special procedures unit for post procedure observation. FINDINGS: The images document guide needle placement within the right iliac bone. Post biopsy images demonstrate no hemorrhage. IMPRESSION: Successful CT-guided bone marrow aspirate and core from the right iliac bone. Electronically Signed   By: Marybelle Killings M.D.   On: 09/09/2016 11:41    Medications: I have reviewed the patient's current medications.   Assessment/Plan: 1) recently diagnosed acute myeloid leukemia based on the recent bone marrow biopsy and aspirate performed 2 days ago. I had a lengthy discussion with the patient and his wife about his current condition and treatment options. Unfortunately the patient is not a good candidate for aggressive treatment for the acute myeloid leukemia. His treatment options will be limited at this time to control the leukocytosis. I strongly recommend for the patient to consider palliative care and hospice referral. I would  consider starting the patient on hydroxyurea 500 mg by mouth twice a day to control his leukocytosis. I discussed with the patient the adverse effects of this treatment. The patient and his  wife are in agreement with the current treatment plan. 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 I will start the patient on allopurinol 100 mg by mouth twice a day. 3) anemia of neoplastic disease. We will continue to monitor for now and consider the patient for PRBCs transfusion if his hemoglobin is less than 8.0 G/DL. 4) thrombocytopenia: Likely secondary to his acute leukemia. We will continue to monitor for now. 5) diabetes mellitus and hypothyroidism: Managed by the primary care team. Thank you for allowing me to participate in the care Mr. Castilleja. Please call if you have any questions. 6) prognosis: Poor.   LOS: 7 days    MOHAMED,MOHAMED K. 09/10/2016

## 2016-09-10 NOTE — Progress Notes (Signed)
Palliative Medicine consult noted. Due to high referral volume, there may be a delay seeing this patient. Please call the Palliative Medicine Team office at [phone] if recommendations are needed in the interim.  Thank you for inviting [place] to see this patient.  Marjie Skiff Halvor Behrend, RN, BSN, [facility] 09/10/2016 3:56 PM Cell [phone] 8:00-4:00 Monday-Friday Office [phone]

## 2016-09-10 NOTE — Progress Notes (Addendum)
PROGRESS NOTE  Brian Mcmillan:[id number] DOB: 06-22-1925 DOA: 09/03/2016 PCP: Brian Mcmillan., MD  HPI/Recap of past 86 hours:  82 year old male with past mental history of ischemic heart disease and chronic systolic CHF with ejection fraction of 45% as well as history of renal cancer status post ablation who was admitted on 11/24 after having bilateral ankle pain 3 weeks and had several falls over the past month. At that time, patient reported a fever at home. Emergency room, patient was afebrile but noted to have a significant leukocytosis at 22. He was also found to have new onset atrial fibrillation. There was a? Of right foot cellulitis due to edematous appearance and soft tissue swelling on x-ray and patient was started on antibiotics. Over the next few days, patient has clinically improved. He was also found to be in mild systolic heart failure and started on diuretics by cardiology. His white count however has continued to trend upward and is currently at 37. Peripheral smear noted by pathology a primary leukocytosis with atypical monocytes. Hematology consulted and patient underwent bone marrow biopsy on morning of 11/30. Uric acid level checked on 11/30 found to be elevated.  Patient started on allopurinol.   Patient doing well. No complaints. Breathing comfortably. Left heel pain continues to improve.    Addendum: Unfortunately, patient's pathology came back which oncology confirmed positive for AML. He is unfortunately not a good candidate for aggressive treatment and his best options are limited with focus on controlling leukocyte perforation. Patient started on hydroxyurea and palate of care consulted. His hyperuricemia is not so much from gout but from AML. Also on allopurinol.  Addendum: At approximately 5:30 PM, patient started having a nosebleed from his left nares. Patient currently has had a long extensive history of nosebleeds that occur randomly and at times he has been  to several emergency rooms her ENT doctors requiring cauterization. This persisted for several hours. Patient is not on any Lovenox or blood thinners. His platelet count checked 24 hours ago was greater than 100. Despite packing, bleeding continued to persist with large clots. Unsuccessful attempt by myself to place a nasal tampon. Fortunately, was able to get assistance from one of the ER PAs  who was able to place an anterior/posterior Rhino Rocket at approximately 7:50 PM successfully. Keep in place and reassess tomorrow. We'll discuss with ENT about removal.   Assessment/Plan: Principal Problem:    Hypothyroidism, acquired continue Synthroid   Essential hypertension   Coronary artery disease due to lipid rich plaque   Acute on stage III chronic kidney failure ([place]): creatinine has started to slightly trend downward. Continue to monitor.    Thrombocytopenia ([place])      Acute on chronic combined systolic and diastolic CHF, NYHA class 2 ([place]): Patient has responded well to Lasix, diuresing over 70 L since admission and weight down 5 pounds. His creatinine has jumped up some so Lasix has been on hold since 11/28.    Paroxysmal atrial fibrillation (HCC) new-onset: Asymptomatic at this time and has since converted to normal sinus rhythm. His chads 2 score is at 5. Patient is at a previous history of severe nosebleeds and does not want to discuss any anticoagulation. Continue Coreg which has been titrated up    Leukocytosis: principal problem. Given peripheral smear, concerning for some cell disorder. Suspect myeloproliferative disorder. Awaiting pathology from bone marrow biopsy. Appreciate hematology help.    Lobar pneumonia, unspecified organism [facility]): On empiric Levaquin since 11/26, stopped today. Blood cultures  with no growth.   Troponin level elevated: No evidence of ischemia, felt to be secondary to elevated heart rate  Epistaxis: As described above. Rhino Rocket in place. We'll discuss with  ENT tomorrow.   Code Status: Full code   Family Communication: Wife at the bedside   Disposition Plan: Awaiting pathology from biopsy as well as creatinine to improve, anticipate discharge in a few days   Consultants:  Hematology  Cardiology   Procedures:  None   Antimicrobials:   Ancef 11/24-11/28  Levaquin 11/26-11/30  DVT prophylaxis: SCDs   Objective: Vitals:   09/09/16 1633 09/09/16 2145 09/10/16 0554 09/10/16 0854  BP: (!) 117/50 (!) 124/57 (!) 122/53   Pulse: (!) 58 62 (!) 57 62  Resp:  18 20   Temp:  99 F (37.2 C) 98.6 F (37 C)   TempSrc:  Oral Oral   SpO2:  95% 98%   Weight:   87.4 kg (192 lb 11.2 oz)   Height:        Intake/Output Summary (Last 24 hours) at 09/10/16 1225 Last data filed at 09/10/16 1000  Gross per 24 hour  Intake          1288.33 ml  Output              450 ml  Net           838.33 ml   Filed Weights   09/08/16 0627 09/09/16 0606 09/10/16 0554  Weight: 89.6 kg (197 lb 8 oz) 89 kg (196 lb 1.6 oz) 87.4 kg (192 lb 11.2 oz)    Exam:   General:  Alert and oriented No acute distress  Cardiovascular: lar: Regular rate and rhythm, S1-S2   Respiratory: Clear to auscultation bilaterally   Abdomen: Soft, nontender, nondistended, positive bowel sounds   Musculoskeletal: No clubbing or cyanosis, trace pitting edema   Skin: Significant bruising on the lateral aspect of the left ankle  Psychiatry: Patient appropriate, no evidence of psychoses    Data Reviewed: CBC:  Recent Labs Lab 09/03/16 1605  09/05/16 0544 09/06/16 0521 09/07/16 0556 09/08/16 0544 09/09/16 0534  WBC 27.1*  < > 23.1* 29.4* 37.2* 37.2* 41.5*  NEUTROABS 1.9  --   --   --   --   --  7.9*  HGB 12.0*  < > 10.3* 11.2* 11.4* 10.8* 10.4*  HCT 35.2*  < > 31.0* 33.3* 34.5* 32.2* 31.1*  MCV 88.0  < > 89.9 88.1 89.8 89.7 89.4  PLT 101*  < > 100* 107* 127* 115* 122*  < > = values in this interval not displayed. Basic Metabolic Panel:  Recent Labs Lab  09/03/16 2314  09/06/16 0521 09/07/16 0556 09/08/16 0544 09/09/16 0534 09/10/16 0451  NA  --   < > 136 135 136 134* 136  K  --   < > 4.2 4.7 4.4 4.1 3.5  CL  --   < > 100* 99* 101 99* 100*  CO2  --   < > 26 24 25 25 24   GLUCOSE  --   < > 104* 125* 93 100* 111*  BUN  --   < > 35* 42* 49* 55* 57*  CREATININE  --   < > 2.42* 3.22* 3.69* 3.78* 3.64*  CALCIUM  --   < > 9.1 9.5 9.3 9.3 9.1  MG 1.8  --   --   --  1.8  --   --   < > = values in this interval not  displayed. GFR: Estimated Creatinine Clearance: 16 mL/min (by C-G formula based on SCr of 3.64 mg/dL (H)). Liver Function Tests: No results for input(s): AST, ALT, ALKPHOS, BILITOT, PROT, ALBUMIN in the last 168 hours. No results for input(s): LIPASE, AMYLASE in the last 168 hours. No results for input(s): AMMONIA in the last 168 hours. Coagulation Profile:  Recent Labs Lab 09/04/16 0141  INR 1.31   Cardiac Enzymes:  Recent Labs Lab 09/03/16 2314 09/04/16 0535 09/04/16 1010 09/04/16 1620  TROPONINI 0.79* 0.47* 0.54* 0.62*   BNP (last 3 results) No results for input(s): PROBNP in the last 8760 hours. HbA1C: No results for input(s): HGBA1C in the last 72 hours. CBG:  Recent Labs Lab 09/09/16 1145 09/09/16 1644 09/09/16 2139 09/10/16 0750 09/10/16 1136  GLUCAP 99 170* 132* 107* 148*   Lipid Profile: No results for input(s): CHOL, HDL, LDLCALC, TRIG, CHOLHDL, LDLDIRECT in the last 72 hours. Thyroid Function Tests: No results for input(s): TSH, T4TOTAL, FREET4, T3FREE, THYROIDAB in the last 72 hours. Anemia Panel: No results for input(s): VITAMINB12, FOLATE, FERRITIN, TIBC, IRON, RETICCTPCT in the last 72 hours. Urine analysis:    Component Value Date/Time   COLORURINE YELLOW 09/03/2016 2030   APPEARANCEUR CLOUDY (A) 09/03/2016 2030   LABSPEC 1.020 09/03/2016 2030   PHURINE 5.0 09/03/2016 2030   GLUCOSEU NEGATIVE 09/03/2016 2030   HGBUR SMALL (A) 09/03/2016 2030   [place] NEGATIVE 09/03/2016 2030     [place] 09/03/2016 2030   PROTEINUR 30 (A) 09/03/2016 2030   UROBILINOGEN 0.2 12/04/2014 2042   NITRITE NEGATIVE 09/03/2016 2030   LEUKOCYTESUR NEGATIVE 09/03/2016 2030   Sepsis Labs: @LABRCNTIP (procalcitonin:4,lacticidven:4)  ) Recent Results (from the past 240 hour(s))  Culture, blood (routine x 2)     Status: None   Collection Time: 09/03/16 10:29 PM  Result Value Ref Range Status   Specimen Description BLOOD RIGHT ANTECUBITAL  Final   Special Requests BOTTLES DRAWN AEROBIC AND ANAEROBIC 5 CC  Final   Culture   Final    NO GROWTH 5 DAYS Performed at [facility]    Report Status 09/08/2016 FINAL  Final  Culture, blood (routine x 2)     Status: None   Collection Time: 09/03/16 10:30 PM  Result Value Ref Range Status   Specimen Description BLOOD LEFT ANTECUBITAL  Final   Special Requests IN PEDIATRIC BOTTLE  4CC  Final   Culture   Final    NO GROWTH 5 DAYS Performed at [facility]    Report Status 09/09/2016 FINAL  Final      Studies: No results found.  Scheduled Meds: . allopurinol  100 mg Oral BID  . benzonatate  100 mg Oral BID  . carvedilol  6.25 mg Oral BID WC  . guaiFENesin  600 mg Oral BID  . insulin aspart  0-5 Units Subcutaneous QHS  . insulin aspart  0-9 Units Subcutaneous TID WC  . levothyroxine  125 mcg Oral QAC breakfast  . montelukast  10 mg Oral QHS  . nitroGLYCERIN  0.6 mg Transdermal Daily  . pantoprazole  40 mg Oral QAC breakfast  . pravastatin  20 mg Oral QHS  . tamsulosin  0.4 mg Oral Daily    Continuous Infusions: . sodium chloride 50 mL/hr at 09/10/16 0907     LOS: 7 days     Annita Brod, MD [facility] Pager [phone]  If 7PM-7AM, please contact night-coverage [email or web address] Password [facility] 09/10/2016, 12:25 PM

## 2016-09-10 NOTE — Progress Notes (Signed)
Physical Therapy Treatment Patient Details Name: Brian Mcmillan MRN: [id number] DOB: [birth year] Today's Date: 09/10/2016    History of Present Illness Brian Mcmillan is a 82 y.o. male with a past medical history significant for ischemic heart disease and CHF EF 45%, HTN, hypothyroidism, renal CA s/p ablation, hx of TIA, MGUS and NIDDM who presents 11/24/17with bilateral ankle pain for three weeks and is found to have elevated creatinine, new onset Afib, elevated troponin,  Acute on chronic systolic and diastolic CHF (congestive heart failure) ([place]) / Acute respiratory failure with hypoxia    PT Comments    Progressing, incr gait distance tolerance today; will continue to  follow  Follow Up Recommendations  Home health PT;Supervision/Assistance - 24 hour     Equipment Recommendations  None recommended by PT    Recommendations for Other Services       Precautions / Restrictions Precautions Precautions: Fall Precaution Comments: monitor HR    Mobility  Bed Mobility   Bed Mobility: Supine to Sit     Supine to sit: Supervision;HOB elevated     General bed mobility comments: extra time, gradually patient was able to self assist to  edge of bed .  Transfers Overall transfer level: Needs assistance Equipment used: None Transfers: Sit to/from Stand Sit to Stand: Min guard;Min assist         General transfer comment: bed ht elevated, extra time and light assist to rise and steady, cues for proper hand placement  Ambulation/Gait Ambulation/Gait assistance: Min guard Ambulation Distance (Feet): 200 Feet Assistive device: Rolling walker (2 wheeled) Gait Pattern/deviations: Step-to pattern;Step-through pattern;Trunk flexed     General Gait Details: very slow gait, no overt LOB although pt hesitant and with decr reaction time regarding  moving obstacles/negotiation; cues for proper RW position and posture; HR 70s    Stairs            Wheelchair Mobility     Modified Rankin (Stroke Patients Only)       Balance           Standing balance support: Single extremity supported Standing balance-Leahy Scale: Fair                      Cognition Arousal/Alertness: Awake/alert Behavior During Therapy: WFL for tasks assessed/performed Overall Cognitive Status: Within Functional Limits for tasks assessed                      Exercises      General Comments        Pertinent Vitals/Pain Pain Assessment: No/denies pain    Home Living                      Prior Function            PT Goals (current goals can now be found in the care plan section) Acute Rehab PT Goals Patient Stated Goal: to go home PT Goal Formulation: With patient/family Time For Goal Achievement: 09/21/16 Potential to Achieve Goals: Good Progress towards PT goals: Progressing toward goals    Frequency    Min 3X/week      PT Plan Current plan remains appropriate    Co-evaluation             End of Session Equipment Utilized During Treatment: Gait belt Activity Tolerance: Patient tolerated treatment well Patient left: in chair;with call bell/phone within reach;with nursing/sitter in room;with chair alarm set;with family/visitor present  Time: [id number] PT Time Calculation (min) (ACUTE ONLY): 18 min  Charges:  $Gait Training: 8-22 mins                    G Codes:      Amina Menchaca 09/11/16, 10:27 AM

## 2016-09-10 NOTE — Progress Notes (Signed)
Patient Name: Brian Mcmillan Date of Encounter: 09/10/2016  Primary Cardiologist: Dr. Alethia [facility] Problem List     Principal Problem:   Acute on chronic systolic CHF (congestive heart failure) (HCC) Active Problems:   Hypothyroidism, acquired   Essential hypertension   Coronary artery disease due to lipid rich plaque   Acute on chronic kidney failure (HCC)   Thrombocytopenia (HCC)   Cellulitis   Acute on chronic combined systolic and diastolic CHF, NYHA class 2 (HCC)   Paroxysmal atrial fibrillation (HCC)   Leukocytosis   Lobar pneumonia, unspecified organism ([place])   Troponin level elevated   AKI (acute kidney injury) ([place])    Subjective   Says he is anxious to go home (concerned his wife who has not left his side since admission is not getting enough sleep). Reports feeling well following his biopsy yesterday. No chest discomfort or palpitations.   Inpatient Medications    Scheduled Meds: . benzonatate  100 mg Oral BID  . carvedilol  6.25 mg Oral BID WC  . guaiFENesin  600 mg Oral BID  . insulin aspart  0-5 Units Subcutaneous QHS  . insulin aspart  0-9 Units Subcutaneous TID WC  . levothyroxine  125 mcg Oral QAC breakfast  . montelukast  10 mg Oral QHS  . nitroGLYCERIN  0.6 mg Transdermal Daily  . pantoprazole  40 mg Oral QAC breakfast  . pravastatin  20 mg Oral QHS  . tamsulosin  0.4 mg Oral Daily   Continuous Infusions: . sodium chloride 50 mL/hr at 09/09/16 1526   PRN Meds: acetaminophen **OR** acetaminophen, ipratropium-albuterol, ondansetron **OR** ondansetron (ZOFRAN) IV, traMADol   Vital Signs    Vitals:   09/09/16 1506 09/09/16 1633 09/09/16 2145 09/10/16 0554  BP: (!) 95/48 (!) 117/50 (!) 124/57 (!) 122/53  Pulse: 68 (!) 58 62 (!) 57  Resp: _0 Temp: 97.8 F (36.6 C)  99 F (37.2 C) 98.6 F (37 C)  TempSrc: Oral  Oral Oral  SpO2: 96%  95% 98%  Weight:    192 lb 11.2 oz (87.4 kg)  Height:        Intake/Output Summary  (Last 24 hours) at 09/10/16 0825 Last data filed at 09/10/16 0700  Gross per 24 hour  Intake          1018.33 ml  Output              350 ml  Net           668.33 ml   Filed Weights   09/08/16 0627 09/09/16 0606 09/10/16 0554  Weight: 197 lb 8 oz (89.6 kg) 196 lb 1.6 oz (89 kg) 192 lb 11.2 oz (87.4 kg)    Physical Exam    GEN: Well nourished, well developed Caucasian male appearing in no acute distress.  HEENT: Grossly normal.  Neck: Supple, no JVD, carotid bruits, or masses. Cardiac: RRR, no murmurs, rubs, or gallops. No clubbing, cyanosis, or edema.  Radials/DP/PT 2+ and equal bilaterally.  Respiratory:  Respirations regular and unlabored, clear to auscultation bilaterally. GI: Soft, nontender, nondistended, BS + x 4. MS: no deformity or atrophy. Skin: warm and dry, left ankle with erythema (significantly improved).   Neuro:  Strength and sensation are intact. Psych: AAOx3.  Normal affect.  Labs    CBC  Recent Labs  09/08/16 0544 09/09/16 0534  WBC 37.2* 41.5*  NEUTROABS  --  7.9*  HGB 10.8* 10.4*  HCT 32.2* 31.1*  MCV 89.7 89.4  PLT 115* 101*   Basic Metabolic Panel  Recent Labs  09/08/16 0544 09/09/16 0534 09/10/16 0451  NA 136 134* 136  K 4.4 4.1 3.5  CL 101 99* 100*  CO2 _0 GLUCOSE 93 100* 111*  BUN 49* 55* 57*  CREATININE 3.69* 3.78* 3.64*  CALCIUM 9.3 9.3 9.1  MG 1.8  --   --      Telemetry    NSR, HR in high-50's to 60's. Occasional PVC's. - Personally Reviewed  ECG    No new tracings.   Radiology    Ct Biopsy  Result Date: 09/09/2016 INDICATION: Leukocytosis EXAM: CT BIOPSY MEDICATIONS: None. ANESTHESIA/SEDATION: Fentanyl 50 mcg IV; Versed 2 mg IV Moderate Sedation Time:  14 The patient was continuously monitored during the procedure by the interventional radiology nurse under my direct supervision. FLUOROSCOPY TIME:  Fluoroscopy Time:  minutes  seconds ( mGy). COMPLICATIONS: None immediate. PROCEDURE: Informed written consent  was obtained from the patient after a thorough discussion of the procedural risks, benefits and alternatives. All questions were addressed. Maximal Sterile Barrier Technique was utilized including caps, mask, sterile gowns, sterile gloves, sterile drape, hand hygiene and skin antiseptic. A timeout was performed prior to the initiation of the procedure. Under CT guidance, a(n) 11 gauge guide needle was advanced into the right iliac bone via posterior approach. Aspirates and a core were obtain. Post biopsy images demonstrate no hemorrhage. Patient tolerated the procedure well without complication. Vital sign monitoring by nursing staff during the procedure will continue as patient is in the special procedures unit for post procedure observation. FINDINGS: The images document guide needle placement within the right iliac bone. Post biopsy images demonstrate no hemorrhage. IMPRESSION: Successful CT-guided bone marrow aspirate and core from the right iliac bone. Electronically Signed   By: Marybelle Killings M.D.   On: 09/09/2016 11:41    Cardiac Studies   Echocardiogram: 09/04/2016 Study Conclusions  - Left ventricle: The cavity size was at the upper limits of normal. Wall thickness was normal. The estimated ejection fraction was in the range of 45% to 50%. Hypokinesis of the basalinferolateral and inferior myocardium. Features are consistent with a pseudonormal left ventricular filling pattern, with concomitant abnormal relaxation and increased filling pressure (grade 2 diastolic dysfunction). Acoustic contrast opacification revealed no evidence ofthrombus. - Mitral valve: There was mild regurgitation. - Left atrium: The atrium was mildly dilated.  Patient Profile     82 yo male w/ PMH of CAD, chronic combined systolic and diastolic CHF, HTN, HLD, and history of severe epistaxis with antiplatelet/anticoagulation admitted with CHF exacerbation, PNA, and new onset atrial fibrillation.    Assessment & Plan    1. New Onset Atrial fibrillation - newly recognized this admission. Asymptomatic at that time. Has since converted to NSR.  - This patients CHA2DS2-VASc Score and unadjusted Ischemic Stroke Rate (% per year) is equal to 7.2 % stroke rate/year from a score of 5 (CHF, HTN, Vascular, Age (2)). He refuses to consider anti-coagulation with his history of severe epistaxis. He could consider the WATCHMAN device if he would be willing to use coumadin for one month. Will need to discuss further with his Primary Cardiologist.  - continue Coreg 6.72m BID.   2. Acute on chronic systolic and diastolic CHF - LVEF 475-10%by echo 09/04/16. He has diuresed -8.8L this admission. Weight down 10 lbs since admission (202 lbs --> 192 lbs). With stark increase in creatinine (2.42 --> 3.22 -->3.69), Lasix has  been held (last dose of IV Lasix given during AM of 09/07/2016). Has been started on gentle IV hydration with creatinine slowly improving. Continue BB. No ACE-I/ARB with renal insufficiency.   3. Elevated troponin - Likely due to elevated HR on admission. No angina to suggest ACS. EKG and echo suggestive of prior inferior MI. Will not plan ischemic evaluation this admission.   4. HTN - BP well-controlled at 95/48 - 143/64 in the past 24 hours.   5. Leukocytosis - WBC trending upwards to 41.5 on 09/09/2016.  - Hematology consulted. Underwent bone marrow biopsy on 09/09/2016. Results pending.   6. Left Ankle Swelling - Significant erythema along left ankle. Uric Acid elevated to 13.8.  - per admitting team  7. Acute on Chronic Stage 4 CKD - creatinine 2.53 on admission, peaking at 3.78 on 11/30. Slowly improving to 3.64 this AM.   Signed, Erma Heritage, PA  09/10/2016, 8:25 AM   I have personally seen and examined this patient with Bernerd Pho, PA-C. I agree with the assessment and plan as outlined above. He is in sinus today. Continue Coreg. He refuses  anti-coagulation. Volume status is much better but renal function has worsened. Lasix on hold today. Gentle hydration. Would continue to hold lasix until renal function improves.   Lauree Chandler 09/10/2016 10:33 AM

## 2016-09-11 LAB — GLUCOSE, CAPILLARY
GLUCOSE-CAPILLARY: 113 mg/dL — AB (ref 65–99)
GLUCOSE-CAPILLARY: 187 mg/dL — AB (ref 65–99)
GLUCOSE-CAPILLARY: 178 mg/dL — AB (ref 65–99)
GLUCOSE-CAPILLARY: 172 mg/dL — AB (ref 65–99)

## 2016-09-11 LAB — CBC
HCT: 30.2 % — ABNORMAL LOW (ref 39.0–52.0)
HEMOGLOBIN: 9.9 g/dL — AB (ref 13.0–17.0)
MCH: 29.4 pg (ref 26.0–34.0)
MCHC: 32.8 g/dL (ref 30.0–36.0)
MCV: 89.6 fL (ref 78.0–100.0)
PLATELETS: 111 10*3/uL — AB (ref 150–400)
RBC: 3.37 MIL/uL — ABNORMAL LOW (ref 4.22–5.81)
RDW: 18.7 % — ABNORMAL HIGH (ref 11.5–15.5)
WBC: 49.9 10*3/uL — ABNORMAL HIGH (ref 4.0–10.5)

## 2016-09-11 LAB — BASIC METABOLIC PANEL
Anion gap: 11 (ref 5–15)
BUN: 46 mg/dL — AB (ref 6–20)
CALCIUM: 8.8 mg/dL — AB (ref 8.9–10.3)
CHLORIDE: 104 mmol/L (ref 101–111)
CO2: 23 mmol/L (ref 22–32)
CREATININE: 3.1 mg/dL — AB (ref 0.61–1.24)
GFR, EST AFRICAN AMERICAN: 19 mL/min — AB (ref >60)
GFR, EST NON AFRICAN AMERICAN: 16 mL/min — AB (ref >60)
Glucose, Bld: 107 mg/dL — ABNORMAL HIGH (ref 65–99)
Potassium: 4 mmol/L (ref 3.5–5.1)
SODIUM: 138 mmol/L (ref 135–145)

## 2016-09-11 MED ORDER — BISACODYL 10 MG RE SUPP
10.0000 mg | Freq: Every day | RECTAL | Status: DC | PRN
  Administered 2016-09-11: 10 mg via RECTAL
  Filled 2016-09-11: qty 1

## 2016-09-11 MED ORDER — COLCHICINE 0.6 MG PO TABS: 0.6000 mg | ORAL_TABLET | Freq: Every day | ORAL | Status: DC

## 2016-09-11 MED ORDER — TRAMADOL HCL 50 MG PO TABS
50.0000 mg | ORAL_TABLET | Freq: Four times a day (QID) | ORAL | Status: DC
  Administered 2016-09-11 – 2016-09-13 (×8): 50 mg via ORAL
  Filled 2016-09-11 (×10): qty 1

## 2016-09-11 MED ORDER — COLCHICINE 0.6 MG PO TABS
0.3000 mg | ORAL_TABLET | Freq: Every day | ORAL | Status: DC
  Administered 2016-09-11 – 2016-09-13 (×3): 0.3 mg via ORAL
  Filled 2016-09-11 (×3): qty 1

## 2016-09-11 MED ORDER — AMOXICILLIN-POT CLAVULANATE 500-125 MG PO TABS
1.0000 | ORAL_TABLET | Freq: Two times a day (BID) | ORAL | Status: DC
  Administered 2016-09-11 – 2016-09-13 (×5): 500 mg via ORAL
  Filled 2016-09-11 (×6): qty 1

## 2016-09-11 NOTE — Progress Notes (Signed)
Subjective: No CP  Breathing uncomfortable through nose   Objective: Vitals:   09/10/16 1412 09/10/16 2102 09/11/16 0400 09/11/16 0500  BP: (!) 123/52 (!) 140/50 (!) 122/52   Pulse: 64 (!) 59 (!) 59   Resp: 16 16 18    Temp: 97.7 F (36.5 C) 97.5 F (36.4 C) 98.4 F (36.9 C)   TempSrc: Oral Oral Oral   SpO2: 94% 97% 97%   Weight:    193 lb (87.5 kg)  Height:       Weight change: 4.8 oz (0.136 kg)  Intake/Output Summary (Last 24 hours) at 09/11/16 0933 Last data filed at 09/11/16 0015  Gross per 24 hour  Intake              790 ml  Output             1100 ml  Net             -310 ml   I/O  9.27 L negative  General: Alert, awake, oriented x3, in no acute distress Neck:  JVP is normal Heart: Regular rate and rhythm, without murmurs, rubs, gallops.  Lungs: Clear to auscultation.  No rales or wheezes. Exemities:  No edema.   Neuro: Grossly intact, nonfocal. Tle:  SR  Lab Results: Results for orders placed or performed during the hospital encounter of 09/03/16 (from the past 24 hour(s))  Glucose, capillary     Status: Abnormal   Collection Time: 09/10/16 11:36 AM  Result Value Ref Range   Glucose-Capillary 148 (H) 65 - 99 mg/dL   Comment 1 Notify RN    Comment 2 Document in Chart   Glucose, capillary     Status: None   Collection Time: 09/10/16  4:58 PM  Result Value Ref Range   Glucose-Capillary 99 65 - 99 mg/dL   Comment 1 Notify RN    Comment 2 Document in Chart   Glucose, capillary     Status: Abnormal   Collection Time: 09/10/16 10:15 PM  Result Value Ref Range   Glucose-Capillary 174 (H) 65 - 99 mg/dL  CBC     Status: Abnormal   Collection Time: 09/11/16  5:35 AM  Result Value Ref Range   WBC 49.9 (H) 4.0 - 10.5 K/uL   RBC 3.37 (L) 4.22 - 5.81 MIL/uL   Hemoglobin 9.9 (L) 13.0 - 17.0 g/dL   HCT 30.2 (L) 39.0 - 52.0 %   MCV 89.6 78.0 - 100.0 fL   MCH 29.4 26.0 - 34.0 pg   MCHC 32.8 30.0 - 36.0 g/dL   RDW 18.7 (H) 11.5 - 15.5 %   Platelets 111 (L) 150  - 400 K/uL  Basic metabolic panel     Status: Abnormal   Collection Time: 09/11/16  5:35 AM  Result Value Ref Range   Sodium 138 135 - 145 mmol/L   Potassium 4.0 3.5 - 5.1 mmol/L   Chloride 104 101 - 111 mmol/L   CO2 23 22 - 32 mmol/L   Glucose, Bld 107 (H) 65 - 99 mg/dL   BUN 46 (H) 6 - 20 mg/dL   Creatinine, Ser 3.10 (H) 0.61 - 1.24 mg/dL   Calcium 8.8 (L) 8.9 - 10.3 mg/dL   GFR calc non Af Amer 16 (L) >60 mL/min   GFR calc Af Amer 19 (L) >60 mL/min   Anion gap 11 5 - 15  Glucose, capillary     Status: Abnormal   Collection Time: 09/11/16  7:31 AM  Result  Value Ref Range   Glucose-Capillary 113 (H) 65 - 99 mg/dL   Comment 1 Notify RN    Comment 2 Document in Chart     Studies/Results: No results found.  Medications:REviewed     @PROBHOSP @  1  Atrial fib  Curr in SR    2  Acute on chronic systolic CHF  Getting gentle hydration   Cr has improved some  Would continue    3  Elev trop  No evid for active ischemia    4  HTN    LOS: 8 days   Dorris Carnes 09/11/2016, 9:33 AM

## 2016-09-11 NOTE — Progress Notes (Signed)
PROGRESS NOTE  Brian Mcmillan DPO:[id number] DOB: 15-Mar-1925 DOA: 09/03/2016 PCP: [name], MD  HPI/Recap of past 75 hours:  82 year old male with past mental history of ischemic heart disease and chronic systolic CHF with ejection fraction of 45% as well as history of renal cancer status post ablation who was admitted on 11/24 after having bilateral ankle pain 3 weeks and had several falls over the past month. At that time, patient reported a fever at home. Emergency room, patient was afebrile but noted to have a significant leukocytosis at 22. He was also found to have new onset atrial fibrillation. There was a? Of right foot cellulitis due to edematous appearance and soft tissue swelling on x-ray and patient was started on antibiotics. Over the next few days, patient has clinically improved. He was also found to be in mild systolic heart failure and started on diuretics by cardiology. His white count however has continued to trend upward and is currently at 37. Peripheral smear noted by pathology a primary leukocytosis with atypical monocytes. Hematology consulted and patient underwent bone marrow biopsy on morning of 11/30. Uric acid level checked on 11/30 found to be elevated.  Patient started on allopurinol.    Unfortunately, patient's pathology came back which oncology confirmed positive for AML. He is unfortunately not a good candidate for aggressive treatment and his best options are limited with focus on controlling leukocyte perforation. Patient started on hydroxyurea and palliative consulted. His hyperuricemia is not so much from gout but from AML. Also on allopurinol.  On 12/1 in the evening, patient started having continued persistent nosebleed from his left nares. He's had a long extensive history of nosebleeds that occur randomly and at times have required trips to the emergency room or ENT for cauterization. Fortunately, was able to get assistance from one of the ER PAs  who  was able to place an anterior/posterior Rhino Rocket that evening. ENT advised keeping it in for 5 days then removing it. They also recommended starting by mouth antibiotics for staph coverage .  Patient had rough night. Complains of discomfort with nasal tampon as well as continued aches, with continued pain in his left lateral malleolus area. He still states he is able to walk okay. He also feels quite overwhelmed with this diagnosis of AML and is not ready to believe that nothing can be done.   Assessment/Plan: Principal Problem: Epistaxis: Continue keeping Rhino Rocket in left nares. Have started Augmentin. Have added Ultram for pain    Hypothyroidism, acquired continue Synthroid   Essential hypertension   Coronary artery disease due to lipid rich plaque   Acute on stage III chronic kidney failure ([place]): creatinine continues to trend downward. Lasix on hold.    Thrombocytopenia ([place]): Stable. Likely secondary to AML.      Acute on chronic combined systolic and diastolic CHF, NYHA class 2 ([place]): Patient has responded well to Lasix, diuresing over 70 L since admission and weight down 5 pounds. His creatinine has jumped up some so Lasix has been on hold since 11/28.    Paroxysmal atrial fibrillation (HCC) new-onset: Asymptomatic at this time and has since converted to normal sinus rhythm. His chads 2 score is at 5. Patient is at a previous history of severe nosebleeds and does not want to discuss any anticoagulation. Continue Coreg which has been titrated up  Acute myelogenous leukemia: Appreciate heme on follow-up. Has been started on hydroxyurea for the leukocytosis. White blood cell count further elevated today Patient not  felt to be a candidate for interventional therapy. Palliative medicine consulted.  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 Started on allopurinol. Looks like symptoms are still persisting. Have added renally adjusted colchicine    Lobar pneumonia, unspecified organism [facility]):  On empiric Levaquin since 11/26, stopped 11/30. Blood cultures with no growth.   Troponin level elevated: No evidence of ischemia, felt to be secondary to elevated heart rate   Code Status: Full code   Family Communication: Wife at the bedside   Disposition Plan: Anticipate discharge in the next few days once he has been seen by palliative care, white blood cell count starts to trend downward and creatinine slightly better   Consultants:  Hematology  Cardiology   Procedures:  None   Antimicrobials:   Ancef 11/24-11/28  Levaquin 11/26-11/30  Augmentin 12/2-present  DVT prophylaxis: SCDs   Objective: Vitals:   09/10/16 1412 09/10/16 2102 09/11/16 0400 09/11/16 0500  BP: (!) 123/52 (!) 140/50 (!) 122/52   Pulse: 64 (!) 59 (!) 59   Resp: 16 16 18    Temp: 97.7 F (36.5 C) 97.5 F (36.4 C) 98.4 F (36.9 C)   TempSrc: Oral Oral Oral   SpO2: 94% 97% 97%   Weight:    87.5 kg (193 lb)  Height:        Intake/Output Summary (Last 24 hours) at 09/11/16 1226 Last data filed at 09/11/16 0015  Gross per 24 hour  Intake              400 ml  Output             1100 ml  Net             -700 ml   Filed Weights   09/09/16 0606 09/10/16 0554 09/11/16 0500  Weight: 89 kg (196 lb 1.6 oz) 87.4 kg (192 lb 11.2 oz) 87.5 kg (193 lb)    Exam:   General:  Alert and oriented, Mild distress from [employer]  Cardiovascular: lar: Regular rate and rhythm, S1-S2   Respiratory: Clear to auscultation bilaterally   Abdomen: Soft, nontender, nondistended, positive bowel sounds   Musculoskeletal: No clubbing or cyanosis, trace pitting edema   Skin: Significant bruising on the lateral aspect of the left lateral malleolus  Psychiatry: Patient appropriate, no evidence of psychoses    Data Reviewed: CBC:  Recent Labs Lab 09/06/16 0521 09/07/16 0556 09/08/16 0544 09/09/16 0534 09/11/16 0535  WBC 29.4* 37.2* 37.2* 41.5* 49.9*  NEUTROABS  --   --   --  7.9*  --   HGB  11.2* 11.4* 10.8* 10.4* 9.9*  HCT 33.3* 34.5* 32.2* 31.1* 30.2*  MCV 88.1 89.8 89.7 89.4 89.6  PLT 107* 127* 115* 122* 355*   Basic Metabolic Panel:  Recent Labs Lab 09/07/16 0556 09/08/16 0544 09/09/16 0534 09/10/16 0451 09/11/16 0535  NA 135 136 134* 136 138  K 4.7 4.4 4.1 3.5 4.0  CL 99* 101 99* 100* 104  CO2 24 25 25 24 23   GLUCOSE 125* 93 100* 111* 107*  BUN 42* 49* 55* 57* 46*  CREATININE 3.22* 3.69* 3.78* 3.64* 3.10*  CALCIUM 9.5 9.3 9.3 9.1 8.8*  MG  --  1.8  --   --   --    GFR: Estimated Creatinine Clearance: 18.8 mL/min (by C-G formula based on SCr of 3.1 mg/dL (H)). Liver Function Tests: No results for input(s): AST, ALT, ALKPHOS, BILITOT, PROT, ALBUMIN in the last 168 hours. No results for input(s): LIPASE, AMYLASE in the last  168 hours. No results for input(s): AMMONIA in the last 168 hours. Coagulation Profile: No results for input(s): INR, PROTIME in the last 168 hours. Cardiac Enzymes:  Recent Labs Lab 09/04/16 1620  TROPONINI 0.62*   BNP (last 3 results) No results for input(s): PROBNP in the last 8760 hours. HbA1C: No results for input(s): HGBA1C in the last 72 hours. CBG:  Recent Labs Lab 09/10/16 0750 09/10/16 1136 09/10/16 1658 09/10/16 2215 09/11/16 0731  GLUCAP 107* 148* 99 174* 113*   Lipid Profile: No results for input(s): CHOL, HDL, LDLCALC, TRIG, CHOLHDL, LDLDIRECT in the last 72 hours. Thyroid Function Tests: No results for input(s): TSH, T4TOTAL, FREET4, T3FREE, THYROIDAB in the last 72 hours. Anemia Panel: No results for input(s): VITAMINB12, FOLATE, FERRITIN, TIBC, IRON, RETICCTPCT in the last 72 hours. Urine analysis:    Component Value Date/Time   COLORURINE YELLOW 09/03/2016 2030   APPEARANCEUR CLOUDY (A) 09/03/2016 2030   LABSPEC 1.020 09/03/2016 2030   PHURINE 5.0 09/03/2016 2030   GLUCOSEU NEGATIVE 09/03/2016 2030   HGBUR SMALL (A) 09/03/2016 2030   [place] NEGATIVE 09/03/2016 2030   [place]  09/03/2016 2030   PROTEINUR 30 (A) 09/03/2016 2030   UROBILINOGEN 0.2 12/04/2014 2042   NITRITE NEGATIVE 09/03/2016 2030   LEUKOCYTESUR NEGATIVE 09/03/2016 2030   Sepsis Labs: @LABRCNTIP (procalcitonin:4,lacticidven:4)  ) Recent Results (from the past 240 hour(s))  Culture, blood (routine x 2)     Status: None   Collection Time: 09/03/16 10:29 PM  Result Value Ref Range Status   Specimen Description BLOOD RIGHT ANTECUBITAL  Final   Special Requests BOTTLES DRAWN AEROBIC AND ANAEROBIC 5 CC  Final   Culture   Final    NO GROWTH 5 DAYS Performed at [facility]    Report Status 09/08/2016 FINAL  Final  Culture, blood (routine x 2)     Status: None   Collection Time: 09/03/16 10:30 PM  Result Value Ref Range Status   Specimen Description BLOOD LEFT ANTECUBITAL  Final   Special Requests IN PEDIATRIC BOTTLE  4CC  Final   Culture   Final    NO GROWTH 5 DAYS Performed at [facility]    Report Status 09/09/2016 FINAL  Final      Studies: No results found.  Scheduled Meds: . allopurinol  100 mg Oral BID  . amoxicillin-clavulanate  1 tablet Oral BID  . benzonatate  100 mg Oral BID  . carvedilol  6.25 mg Oral BID WC  . colchicine  0.3 mg Oral Daily  . guaiFENesin  600 mg Oral BID  . hydroxyurea  500 mg Oral BID  . insulin aspart  0-5 Units Subcutaneous QHS  . insulin aspart  0-9 Units Subcutaneous TID WC  . levothyroxine  125 mcg Oral QAC breakfast  . montelukast  10 mg Oral QHS  . nitroGLYCERIN  0.6 mg Transdermal Daily  . pantoprazole  40 mg Oral QAC breakfast  . pravastatin  20 mg Oral QHS  . tamsulosin  0.4 mg Oral Daily  . traMADol  50 mg Oral Q6H    Continuous Infusions: . sodium chloride 50 mL/hr at 09/10/16 0907     LOS: 8 days     Annita Brod, MD [facility] Pager [phone]  If 7PM-7AM, please contact night-coverage [email or web address] Password [facility] 09/11/2016, 12:26 PM

## 2016-09-11 NOTE — Consult Note (Signed)
Consultation Note Date: 09/11/2016   Patient Name: Brian Mcmillan  DOB: 03/23/1925  MRN: [id number]  Age / Sex: 82 y.o., male  PCP: Redmond School, MD Referring Physician: Annita Brod, MD  Reason for Consultation: Establishing goals of care  HPI/Patient Profile: 82 y.o. male    admitted on 09/03/2016    Clinical Assessment and Goals of Care:  82 year old gentleman who lives at home with his wife in [address], [place]. Patient has a past medical history significant for ischemic heart disease chronic systolic congestive heart failure known ejection fraction 45%, history of skin cancer, history of cancer status post ablation, history of MGUS for which she is followed by an oncologist in need, [place], admitted to hospitalist service here since 11/24 for bilateral ankle pain, fever, significant leukocytosis. Hospital course also complicated by mild systolic heart failure exacerbation. Peripheral blood smear noted primary leukocytosis with atypical monocytes, bone marrow biopsy confirmed positive for AML. Patient has been seen and evaluated by medical oncology here with final recommendations for patient likely not being considered a good candidate for aggressive treatment with final recommendations for controlling leukocyte proliferation with hydroxyurea and consideration for hospice services. Additionally, hospital course also complicated by nosebleed requiring packing, Rino packet placement in the left nostril. Palliative consultation obtained for goals of care discussions.  Patient is sitting up in his chair attempting to eat breakfast. Wife is also present at the bedside He complains of feeling miserable. He states he did not rest well overnight. He complains of discomfort from nasal tampon. Complaints of pain inside his left nostril as well as left side of his face. There is no active epistaxis  noted at the time of my exam. Introduced myself and palliative care as follows: Palliative medicine is specialized medical care for people living with serious illness. It focuses on providing relief from the symptoms and stress of a serious illness. The goal is to improve quality of life for both the patient and the family.  Detailed discussions undertaken. Goals of care discussions undertaken. See recommendations below. Thank you for the consult.  NEXT OF KIN  Wife. Patient has 2 sons who live nearby. Patient has a granddaughter who lives nearby.  SUMMARY OF RECOMMENDATIONS    Full code for now.  Patient has been seen by an oncologist Pieter Partridge, MD) in [place], [place], in June 2017, for MGUS. Patient was supposed to follow-up with him again in 6 months. Expect follow-up after discharge.  Patient is familiar with hospice services, lives in [place] is familiar with [facility] [place]: Discussed with patient and wife in detail about how hospice services can help at home. All questions answered to the best of my ability. Anticipate patient being accepting of hospice services at the time of discharge.  Agree with Ultram for pain management. Patient wishes to avoid strong opioids. States he cannot take aspirin.  Supportive care and active listening provided for patient and wife as they continue to ponder upon his recent diagnosis  of AML.  Zofran PRN nausea.    Code Status/Advance Care Planning:  Full code    Symptom Management:     As above  Palliative Prophylaxis:   Bowel Regimen  Additional Recommendations (Limitations, Scope, Preferences):  Full Scope Treatment  Psycho-social/Spiritual:   Desire for further Chaplaincy support:no  Additional Recommendations: Caregiving  Support/Resources  Prognosis:   Guarded   Discharge Planning: Home with Hospice      Primary Diagnoses: Present on Admission: . Acute on chronic kidney  failure ([place]) . Acute on chronic systolic CHF (congestive heart failure) ([place]) . Hypothyroidism, acquired . Essential hypertension . Coronary artery disease due to lipid rich plaque . Thrombocytopenia ([place]) . Cellulitis . Acute on chronic combined systolic and diastolic CHF, NYHA class 2 ([place]) . 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 NOSEBLEED   I have reviewed the medical record, interviewed the patient and family, and examined the patient. The following aspects are pertinent.  Past Medical History:  Diagnosis Date  . Abdominal mass 2008   chronic inflammatory mass of the mesentary Dr [name]  . CAD (coronary artery disease)    cath 09/23/09 70% Dx1 (diffuse disease), 40% Cx, Large RCA 80-90% with heavy calcification / cath 06/2010 no change tiht RCA still best to treat medially with nosebleeds  . Chest pain    myoview 2006 no ischemia/ myoview 2009 no ischemia  . Dyslipidemia   . Hypertension    no RAS  . Hypothyroidism   . Kidney mass 2008   [facility]  . Left ventricular dysfunction    myoview 2006 normal / myoview 2009 normal / EF 60% cath 09/2009, EF 60% cath 06/2010  . Memory impairment    memory decreasing 06/2010  . Nosebleed    significant, can not take ASA  . Past heart attack    X2  . Rash Sept 2011   rash on buttocks, hospitalized hydralazine stopped but then restarted w/o return of rash  . Systolic click    no mitral valve prolapse   Social History   Social History  . Marital status: Married    Spouse name: N/A  . Number of children: N/A  . Years of education: N/A   Occupational History  . Retired Retired   Social History Main Topics  . Smoking status: Former Smoker    Packs/day: 0.50    Years: 42.00    Types: Cigarettes    Quit date: 10/11/1980  . Smokeless tobacco: Never Used  . Alcohol use No  . Drug use: Unknown  . Sexual activity: Not Asked   Other Topics Concern  . None   Social History Narrative  . None     Family History  Problem Relation Age of Onset  . Cancer Maternal Grandfather   . Coronary artery disease     Scheduled Meds: . allopurinol  100 mg Oral BID  . amoxicillin-clavulanate  1 tablet Oral BID  . benzonatate  100 mg Oral BID  . carvedilol  6.25 mg Oral BID WC  . colchicine  0.3 mg Oral Daily  . guaiFENesin  600 mg Oral BID  . hydroxyurea  500 mg Oral BID  . insulin aspart  0-5 Units Subcutaneous QHS  . insulin aspart  0-9 Units Subcutaneous TID WC  . levothyroxine  125 mcg Oral QAC breakfast  . montelukast  10 mg Oral QHS  . nitroGLYCERIN  0.6 mg Transdermal Daily  . pantoprazole  40 mg Oral QAC breakfast  . pravastatin  20 mg Oral QHS  . tamsulosin  0.4 mg Oral Daily  . traMADol  50 mg Oral Q6H   Continuous Infusions: . sodium chloride 50 mL/hr at 09/10/16 0907   PRN Meds:.acetaminophen **OR** acetaminophen, bisacodyl, ipratropium-albuterol, ondansetron **OR** ondansetron (ZOFRAN) IV, traMADol Medications Prior to Admission:  Prior to Admission medications   Medication Sig Start Date End Date Taking? Authorizing Provider  acetaminophen (TYLENOL) 500 MG tablet Take 500 mg by mouth every 6 (six) hours as needed for moderate pain.   Yes Historical Provider, MD  albuterol-ipratropium (COMBIVENT) 18-103 MCG/ACT inhaler Inhale 2-4 puffs into the lungs 2 (two) times daily as needed for wheezing.    Yes Historical Provider, MD  carvedilol (COREG) 6.25 MG tablet Take 6.25 mg by mouth 2 (two) times daily with a meal.     Yes Historical Provider, MD  Cholecalciferol (VITAMIN D3) 5000 UNITS CAPS Take 5,000 Units by mouth daily.    Yes Historical Provider, MD  fluticasone (FLONASE) 50 MCG/ACT nasal spray Place 2 sprays into both nostrils at bedtime as needed for allergies.  01/02/16  Yes Historical Provider, MD  furosemide (LASIX) 40 MG tablet Take 20 mg by mouth daily.    Yes Historical Provider, MD  ipratropium (ATROVENT) 0.03 % nasal spray Place 2 sprays into both nostrils 3  (three) times daily.  01/02/16  Yes Historical Provider, MD  levothyroxine (SYNTHROID, LEVOTHROID) 125 MCG tablet Take 125 mcg by mouth daily.    Yes Historical Provider, MD  montelukast (SINGULAIR) 10 MG tablet Take 10 mg by mouth at bedtime.    Yes Historical Provider, MD  multivitamin-iron-minerals-folic acid (THERAPEUTIC-M) TABS tablet Take 1 tablet by mouth daily.   Yes Historical Provider, MD  nitroGLYCERIN (NITRODUR - DOSED IN MG/24 HR) 0.6 mg/hr patch Place 0.6 mg onto the skin daily.   Yes Historical Provider, MD  nitroGLYCERIN (NITROSTAT) 0.4 MG SL tablet Place 1 tablet (0.4 mg total) under the tongue as needed. Patient taking differently: Place 0.4 mg under the tongue as needed for chest pain.  08/19/11  Yes Carlena Bjornstad, MD  Omega-3 Fatty Acids (FISH OIL) 1000 MG CPDR Take 1,000 mg by mouth daily.   Yes Historical Provider, MD  pantoprazole (PROTONIX) 40 MG tablet Take 1 tablet (40 mg total) by mouth daily before breakfast. 03/23/16  Yes [name], MD  pravastatin (PRAVACHOL) 20 MG tablet Take 20 mg by mouth at bedtime. 12/28/12  Yes Historical Provider, MD  tamsulosin (FLOMAX) 0.4 MG CAPS capsule Take 0.4 mg by mouth daily.   Yes Historical Provider, MD  tiotropium (SPIRIVA) 18 MCG inhalation capsule Place 18 mcg into inhaler and inhale daily.   Yes Historical Provider, MD  vitamin B-12 (CYANOCOBALAMIN) 1000 MCG tablet Take 1,000 mcg by mouth daily.     Yes Historical Provider, MD   Allergies  Allergen Reactions  . Ranolazine Shortness Of Breath  . Simvastatin Other (See Comments)    fatigue  . Aspirin Other (See Comments)    Causes severe bleeding  . Contrast Media [Iodinated Diagnostic Agents] Other (See Comments)    Will shut kidneys down  . Ibuprofen Other (See Comments)    Nose bleeds  . Ioxaglate Other (See Comments)    Will shut kidneys down   Review of Systems + for pain/Discomfort in the left nostril  Physical Exam Elderly gentleman sitting in chair As I  know packet in left nostril complains of pain and discomfort S1-S2 Irregular Abdomen soft Bruising left lower extremity Awake alert  oriented answers all questions appropriately little hard of hearing  Vital Signs: BP (!) 122/52 (BP Location: Right Arm)   Pulse (!) 59   Temp 98.4 F (36.9 C) (Oral)   Resp 18   Ht _0  (1.88 m)   Wt 87.5 kg (193 lb)   SpO2 97%   BMI 24.78 kg/m  Pain Assessment: No/denies pain   Pain Score: 0-No pain   SpO2: SpO2: 97 % O2 Device:SpO2: 97 % O2 Flow Rate: .O2 Flow Rate (L/min): 2 L/min  IO: Intake/output summary:  Intake/Output Summary (Last 24 hours) at 09/11/16 1406 Last data filed at 09/11/16 1100  Gross per 24 hour  Intake              640 ml  Output              800 ml  Net             -160 ml    LBM: Last BM Date: 09/10/16 Baseline Weight: Weight: 91 kg (200 lb 9.6 oz) Most recent weight: Weight: 87.5 kg (193 lb)     Palliative Assessment/Data:   Flowsheet Rows   Flowsheet Row Most Recent Value  Intake Tab  Referral Department  Hospitalist  Unit at Time of Referral  Med/Surg Unit  Palliative Care Primary Diagnosis  Cancer  Palliative Care Type  New Palliative care  Reason for referral  Clarify Goals of Care, Pain  Date first seen by Palliative Care  09/11/16  Clinical Assessment  Palliative Performance Scale Score  40%  Pain Max last 24 hours  7  Pain Min Last 24 hours  6  Dyspnea Min Last 24 hours  5  Nausea Max Last 24 Hours  4  Nausea Min Last 24 Hours  4  Psychosocial & Spiritual Assessment  Palliative Care Outcomes  Patient/Family meeting held?  Yes  Who was at the meeting?  patient wife   Palliative Care Outcomes  Improved pain interventions      Time In:  9 Time Out: 10 Time Total:60   Greater than 50%  of this time was spent counseling and coordinating care related to the above assessment and plan.  Signed by: Loistine Chance, MD  [phone]  Please contact Palliative Medicine Team phone at [phone] for  questions and concerns.  For individual provider: See Shea Evans

## 2016-09-12 LAB — GLUCOSE, CAPILLARY
GLUCOSE-CAPILLARY: 108 mg/dL — AB (ref 65–99)
GLUCOSE-CAPILLARY: 160 mg/dL — AB (ref 65–99)
Glucose-Capillary: 166 mg/dL — ABNORMAL HIGH (ref 65–99)
Glucose-Capillary: 133 mg/dL — ABNORMAL HIGH (ref 65–99)

## 2016-09-12 LAB — CBC
HCT: 30.9 % — ABNORMAL LOW (ref 39.0–52.0)
HEMOGLOBIN: 10.2 g/dL — AB (ref 13.0–17.0)
MCH: 30.2 pg (ref 26.0–34.0)
MCHC: 33 g/dL (ref 30.0–36.0)
MCV: 91.4 fL (ref 78.0–100.0)
Platelets: 112 10*3/uL — ABNORMAL LOW (ref 150–400)
RBC: 3.38 MIL/uL — AB (ref 4.22–5.81)
RDW: 19 % — ABNORMAL HIGH (ref 11.5–15.5)
WBC: 66 10*3/uL (ref 4.0–10.5)

## 2016-09-12 LAB — BASIC METABOLIC PANEL
Anion gap: 8 (ref 5–15)
BUN: 33 mg/dL — ABNORMAL HIGH (ref 6–20)
CHLORIDE: 104 mmol/L (ref 101–111)
CO2: 24 mmol/L (ref 22–32)
Calcium: 9 mg/dL (ref 8.9–10.3)
Creatinine, Ser: 2.42 mg/dL — ABNORMAL HIGH (ref 0.61–1.24)
GFR calc non Af Amer: 22 mL/min — ABNORMAL LOW (ref >60)
GFR, EST AFRICAN AMERICAN: 26 mL/min — AB (ref >60)
Glucose, Bld: 108 mg/dL — ABNORMAL HIGH (ref 65–99)
POTASSIUM: 3.5 mmol/L (ref 3.5–5.1)
SODIUM: 136 mmol/L (ref 135–145)

## 2016-09-12 LAB — URIC ACID: URIC ACID, SERUM: 9.2 mg/dL — AB (ref 4.4–7.6)

## 2016-09-12 MED ORDER — ALUM & MAG HYDROXIDE-SIMETH 200-200-20 MG/5ML PO SUSP
30.0000 mL | ORAL | Status: AC
  Administered 2016-09-12: 30 mL via ORAL
  Filled 2016-09-12: qty 30

## 2016-09-12 MED ORDER — HYDROXYUREA 500 MG PO CAPS
500.0000 mg | ORAL_CAPSULE | Freq: Three times a day (TID) | ORAL | Status: DC
  Administered 2016-09-12 (×2): 500 mg via ORAL
  Filled 2016-09-12 (×3): qty 1

## 2016-09-12 MED ORDER — FLEET ENEMA 7-19 GM/118ML RE ENEM
1.0000 | ENEMA | Freq: Once | RECTAL | Status: AC
  Administered 2016-09-12: 1 via RECTAL
  Filled 2016-09-12: qty 1

## 2016-09-12 NOTE — Progress Notes (Signed)
Lab reports WBC's this AM are 66.0.  Yesterday WBC's were 49.9 in this pt with AML.  VSS.

## 2016-09-12 NOTE — Progress Notes (Signed)
 PROGRESS NOTE  Brian Mcmillan MRN:[id number] DOB: 05/31/1925 DOA: 09/03/2016 PCP: FUSCO,LAWRENCE J., MD  HPI/Recap of past 24 hours:  82-year-old male with past mental history of ischemic heart disease and chronic systolic CHF with ejection fraction of 45% as well as history of renal cancer status post ablation who was admitted on 11/24 after having bilateral ankle pain 3 weeks and had several falls over the past month. At that time, patient reported a fever at home. Emergency room, patient was afebrile but noted to have a significant leukocytosis at 22. He was also found to have new onset atrial fibrillation. There was a? Of right foot cellulitis due to edematous appearance and soft tissue swelling on x-ray and patient was started on antibiotics. Over the next few days, patient has clinically improved. He was also found to be in mild systolic heart failure and started on diuretics by cardiology. His white count however has continued to trend upward and is currently at 37. Peripheral smear noted by pathology a primary leukocytosis with atypical monocytes. Hematology consulted and patient underwent bone marrow biopsy on morning of 11/30. Uric acid level checked on 11/30 found to be elevated.  Patient started on allopurinol.    Unfortunately, patient's pathology came back which oncology confirmed positive for AML. He is unfortunately not a good candidate for aggressive treatment and his best options are limited with focus on controlling leukocyte perforation. Patient started on hydroxyurea and palliative consulted. His hyperuricemia is not so much from gout but from AML. Also on allopurinol.  On 12/1 in the evening, patient started having continued persistent nosebleed from his left nares. He's had a long extensive history of nosebleeds that occur randomly and at times have required trips to the emergency room or ENT for cauterization. Fortunately, was able to get assistance from one of the ER PAs  who  was able to place an anterior/posterior Rhino Rocket that evening. ENT advised keeping it in for 5 days then removing it. They also recommended starting by mouth antibiotics for staph coverage .  Despite starting hydroxyurea, white count continues to climb. Spoke with oncology who advised increasing hydroxyurea to 500 3 times a day.    Patient himself today is little bit better, but still uncomfortable. Would like Rhino Rocket out. Has not had a bowel movement now for quite a few days. Creatinine greatly improved   Assessment/Plan: Principal Problem: Epistaxis: Continue keeping Rhino Rocket in left nares. Have started Augmentin. Have added Ultram for pain. We'll check with ENT, likely remove tomorrow     Hypothyroidism, acquired continue Synthroid   Essential hypertension   Coronary artery disease due to lipid rich plaque   Acute on stage III chronic kidney failure (HCC): creatinine continues to trend downward. Lasix on hold.We'll stop IV fluids    Thrombocytopenia (HCC): Stable. Likely secondary to AML.      Acute on chronic combined systolic and diastolic CHF, NYHA class 2 (HCC): Patient has responded well to Lasix, diuresing over 70 L since admission and weight down 5 pounds. His creatinine has jumped up some so Lasix has been on hold since 11/28.    Paroxysmal atrial fibrillation (HCC) new-onset: Asymptomatic at this time and has since converted to normal sinus rhythm. His chads 2 score is at 5. Patient is at a previous history of severe nosebleeds and does not want to discuss any anticoagulation. Continue Coreg which has been titrated up  Acute myelogenous leukemia: Appreciate heme on follow-up. Has been started on hydroxyurea for   the leukocytosis.White blood cell count continues to climb, today at 66. Hydroxyurea increased to 500 3 times a day.t not felt to be a candidate for interventional therapy. Palliative medicine consulted.  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 Started on  allopurinol. Looks like symptoms are still persisting. Have added renally adjusted colchicine, Recheck serum uric acid level    Lobar pneumonia, unspecified organism (HCC): On empiric Levaquin since 11/26, stopped 11/30. Blood cultures with no growth.   Troponin level elevated: No evidence of ischemia, felt to be secondary to elevated heart rate   Code Status: Full code   Family Communication: Wife at the bedside   Disposition Plan: Am hoping that he can go home soon. Possibly tomorrow, would like to see white blood cell count coming down first. We'll further discuss with oncology   Consultants:  HematologyOncology  Palliative medicine  Cardiology    Procedures  Bone marrow biopsy done on 11/30: Features consistent with AML   Antimicrobials:   Ancef 11/24-11/28  Levaquin 11/26-11/30  Augmentin 12/2-present  DVT prophylaxis: SCDs   Objective: Vitals:   09/11/16 0500 09/11/16 1610 09/11/16 2106 09/12/16 0604  BP:  (!) 125/54 (!) 175/60 (!) 162/60  Pulse:  62 64 65  Resp:  20 20 16  Temp:  97.4 F (36.3 C) 98.5 F (36.9 C) 98.1 F (36.7 C)  TempSrc:  Oral Oral Oral  SpO2:  96% 98% 92%  Weight: 87.5 kg (193 lb)   89.7 kg (197 lb 12.8 oz)  Height:        Intake/Output Summary (Last 24 hours) at 09/12/16 1117 Last data filed at 09/12/16 1035  Gross per 24 hour  Intake          2149.17 ml  Output              850 ml  Net          1299.17 ml   Filed Weights   09/10/16 0554 09/11/16 0500 09/12/16 0604  Weight: 87.4 kg (192 lb 11.2 oz) 87.5 kg (193 lb) 89.7 kg (197 lb 12.8 oz)    Exam:   General:  Alert and oriented, Mild distress from Rhino Rocket  Cardiovascular: Regular rate and rhythm, S1-S2   Respiratory: Clear to auscultation bilaterally   Abdomen: Soft, nontender, Mild distention, positive bowel sounds   Musculoskeletal: No clubbing or cyanosis, trace pitting edema   Skin: Significant bruising on the lateral aspect of the left lateral  malleolus  Psychiatry: Patient appropriate, no evidence of psychoses    Data Reviewed: CBC:  Recent Labs Lab 09/07/16 0556 09/08/16 0544 09/09/16 0534 09/11/16 0535 09/12/16 0539  WBC 37.2* 37.2* 41.5* 49.9* 66.0*  NEUTROABS  --   --  7.9*  --   --   HGB 11.4* 10.8* 10.4* 9.9* 10.2*  HCT 34.5* 32.2* 31.1* 30.2* 30.9*  MCV 89.8 89.7 89.4 89.6 91.4  PLT 127* 115* 122* 111* 112*   Basic Metabolic Panel:  Recent Labs Lab 09/08/16 0544 09/09/16 0534 09/10/16 0451 09/11/16 0535 09/12/16 0539  NA 136 134* 136 138 136  K 4.4 4.1 3.5 4.0 3.5  CL 101 99* 100* 104 104  CO2 25 25 24 23 24  GLUCOSE 93 100* 111* 107* 108*  BUN 49* 55* 57* 46* 33*  CREATININE 3.69* 3.78* 3.64* 3.10* 2.42*  CALCIUM 9.3 9.3 9.1 8.8* 9.0  MG 1.8  --   --   --   --    GFR: Estimated Creatinine Clearance: 24.1 mL/min (by C-G formula   based on SCr of 2.42 mg/dL (H)). Liver Function Tests: No results for input(s): AST, ALT, ALKPHOS, BILITOT, PROT, ALBUMIN in the last 168 hours. No results for input(s): LIPASE, AMYLASE in the last 168 hours. No results for input(s): AMMONIA in the last 168 hours. Coagulation Profile: No results for input(s): INR, PROTIME in the last 168 hours. Cardiac Enzymes: No results for input(s): CKTOTAL, CKMB, CKMBINDEX, TROPONINI in the last 168 hours. BNP (last 3 results) No results for input(s): PROBNP in the last 8760 hours. HbA1C: No results for input(s): HGBA1C in the last 72 hours. CBG:  Recent Labs Lab 09/11/16 0731 09/11/16 1234 09/11/16 1722 09/11/16 2103 09/12/16 0745  GLUCAP 113* 187* 178* 172* 108*   Lipid Profile: No results for input(s): CHOL, HDL, LDLCALC, TRIG, CHOLHDL, LDLDIRECT in the last 72 hours. Thyroid Function Tests: No results for input(s): TSH, T4TOTAL, FREET4, T3FREE, THYROIDAB in the last 72 hours. Anemia Panel: No results for input(s): VITAMINB12, FOLATE, FERRITIN, TIBC, IRON, RETICCTPCT in the last 72 hours. Urine analysis:      Component Value Date/Time   COLORURINE YELLOW 09/03/2016 2030   APPEARANCEUR CLOUDY (A) 09/03/2016 2030   LABSPEC 1.020 09/03/2016 2030   PHURINE 5.0 09/03/2016 2030   GLUCOSEU NEGATIVE 09/03/2016 2030   HGBUR SMALL (A) 09/03/2016 2030   BILIRUBINUR NEGATIVE 09/03/2016 2030   KETONESUR NEGATIVE 09/03/2016 2030   PROTEINUR 30 (A) 09/03/2016 2030   UROBILINOGEN 0.2 12/04/2014 2042   NITRITE NEGATIVE 09/03/2016 2030   LEUKOCYTESUR NEGATIVE 09/03/2016 2030   Sepsis Labs: @LABRCNTIP(procalcitonin:4,lacticidven:4)  ) Recent Results (from the past 240 hour(s))  Culture, blood (routine x 2)     Status: None   Collection Time: 09/03/16 10:29 PM  Result Value Ref Range Status   Specimen Description BLOOD RIGHT ANTECUBITAL  Final   Special Requests BOTTLES DRAWN AEROBIC AND ANAEROBIC 5 CC  Final   Culture   Final    NO GROWTH 5 DAYS Performed at [facility]    Report Status 09/08/2016 FINAL  Final  Culture, blood (routine x 2)     Status: None   Collection Time: 09/03/16 10:30 PM  Result Value Ref Range Status   Specimen Description BLOOD LEFT ANTECUBITAL  Final   Special Requests IN PEDIATRIC BOTTLE  4CC  Final   Culture   Final    NO GROWTH 5 DAYS Performed at  Hospital    Report Status 09/09/2016 FINAL  Final      Studies: No results found.  Scheduled Meds: . allopurinol  100 mg Oral BID  . alum & mag hydroxide-simeth  30 mL Oral NOW  . amoxicillin-clavulanate  1 tablet Oral BID  . benzonatate  100 mg Oral BID  . carvedilol  6.25 mg Oral BID WC  . colchicine  0.3 mg Oral Daily  . guaiFENesin  600 mg Oral BID  . hydroxyurea  500 mg Oral TID  . insulin aspart  0-5 Units Subcutaneous QHS  . insulin aspart  0-9 Units Subcutaneous TID WC  . levothyroxine  125 mcg Oral QAC breakfast  . montelukast  10 mg Oral QHS  . nitroGLYCERIN  0.6 mg Transdermal Daily  . pantoprazole  40 mg Oral QAC breakfast  . pravastatin  20 mg Oral QHS  . tamsulosin  0.4 mg  Oral Daily  . traMADol  50 mg Oral Q6H    Continuous Infusions:    LOS: 9 days     KRISHNAN,SENDIL K, MD [facility] Pager [phone]  If   7PM-7AM, please contact night-coverage [email or web address] Password [email or web address] 09/12/2016, 11:17 AM

## 2016-09-12 NOTE — Progress Notes (Signed)
Subjective: Appears comfortable  No CP    Objective: Vitals:   09/11/16 0500 09/11/16 1610 09/11/16 2106 09/12/16 0604  BP:  (!) 125/54 (!) 175/60 (!) 162/60  Pulse:  62 64 65  Resp:  20 20 16   Temp:  97.4 F (36.3 C) 98.5 F (36.9 C) 98.1 F (36.7 C)  TempSrc:  Oral Oral Oral  SpO2:  96% 98% 92%  Weight: 193 lb (87.5 kg)   197 lb 12.8 oz (89.7 kg)  Height:       Weight change: 4 lb 12.8 oz (2.177 kg)  Intake/Output Summary (Last 24 hours) at 09/12/16 0818 Last data filed at 09/12/16 0600  Gross per 24 hour  Intake             1920 ml  Output              850 ml  Net             1070 ml   I/O  - 8.2 L  General: Alert, awake, oriented x3, in no acute distress Neck:  JVP is normal Heart: Regular rate and rhythm, without murmurs, rubs, gallops.  Lungs: Clear to auscultation.  No rales or wheezes. Exemities:  No edema.   Neuro: Grossly intact, nonfocal.   Lab Results: Results for orders placed or performed during the hospital encounter of 09/03/16 (from the past 24 hour(s))  Glucose, capillary     Status: Abnormal   Collection Time: 09/11/16 12:34 PM  Result Value Ref Range   Glucose-Capillary 187 (H) 65 - 99 mg/dL   Comment 1 Notify RN    Comment 2 Document in Chart   Glucose, capillary     Status: Abnormal   Collection Time: 09/11/16  5:22 PM  Result Value Ref Range   Glucose-Capillary 178 (H) 65 - 99 mg/dL   Comment 1 Notify RN    Comment 2 Document in Chart   Glucose, capillary     Status: Abnormal   Collection Time: 09/11/16  9:03 PM  Result Value Ref Range   Glucose-Capillary 172 (H) 65 - 99 mg/dL  CBC     Status: Abnormal   Collection Time: 09/12/16  5:39 AM  Result Value Ref Range   WBC 66.0 (HH) 4.0 - 10.5 K/uL   RBC 3.38 (L) 4.22 - 5.81 MIL/uL   Hemoglobin 10.2 (L) 13.0 - 17.0 g/dL   HCT 30.9 (L) 39.0 - 52.0 %   MCV 91.4 78.0 - 100.0 fL   MCH 30.2 26.0 - 34.0 pg   MCHC 33.0 30.0 - 36.0 g/dL   RDW 19.0 (H) 11.5 - 15.5 %   Platelets 112 (L) 150  - 400 K/uL  Basic metabolic panel     Status: Abnormal   Collection Time: 09/12/16  5:39 AM  Result Value Ref Range   Sodium 136 135 - 145 mmol/L   Potassium 3.5 3.5 - 5.1 mmol/L   Chloride 104 101 - 111 mmol/L   CO2 24 22 - 32 mmol/L   Glucose, Bld 108 (H) 65 - 99 mg/dL   BUN 33 (H) 6 - 20 mg/dL   Creatinine, Ser 2.42 (H) 0.61 - 1.24 mg/dL   Calcium 9.0 8.9 - 10.3 mg/dL   GFR calc non Af Amer 22 (L) >60 mL/min   GFR calc Af Amer 26 (L) >60 mL/min   Anion gap 8 5 - 15  Glucose, capillary     Status: Abnormal   Collection Time: 09/12/16  7:45  AM  Result Value Ref Range   Glucose-Capillary 108 (H) 65 - 99 mg/dL    Studies/Results: No results found.  Medications: {Reviewed   @PROBHOSP @  1  PAF  2  Acute on chronic systolic CHF  Volume is not bad  Cr has come down  I would stop IV fluids  Follow today  Check in am WOuld not resume po lasix yet    3  HTN  BP is up a little this am  FOllow before adding anything    LOS: 9 days   Dorris Carnes 09/12/2016, 8:18 AM

## 2016-09-13 LAB — CBC
HCT: 32.2 % — ABNORMAL LOW (ref 39.0–52.0)
Hemoglobin: 10.5 g/dL — ABNORMAL LOW (ref 13.0–17.0)
MCH: 29.9 pg (ref 26.0–34.0)
MCHC: 32.6 g/dL (ref 30.0–36.0)
MCV: 91.7 fL (ref 78.0–100.0)
PLATELETS: 114 10*3/uL — AB (ref 150–400)
RBC: 3.51 MIL/uL — ABNORMAL LOW (ref 4.22–5.81)
RDW: 19.4 % — AB (ref 11.5–15.5)
WBC: 75.7 10*3/uL (ref 4.0–10.5)

## 2016-09-13 LAB — BASIC METABOLIC PANEL
Anion gap: 9 (ref 5–15)
BUN: 27 mg/dL — AB (ref 6–20)
CALCIUM: 9 mg/dL (ref 8.9–10.3)
CO2: 24 mmol/L (ref 22–32)
CREATININE: 2.47 mg/dL — AB (ref 0.61–1.24)
Chloride: 102 mmol/L (ref 101–111)
GFR calc non Af Amer: 22 mL/min — ABNORMAL LOW (ref >60)
GFR, EST AFRICAN AMERICAN: 25 mL/min — AB (ref >60)
Glucose, Bld: 111 mg/dL — ABNORMAL HIGH (ref 65–99)
Potassium: 4 mmol/L (ref 3.5–5.1)
SODIUM: 135 mmol/L (ref 135–145)

## 2016-09-13 LAB — GLUCOSE, CAPILLARY
Glucose-Capillary: 102 mg/dL — ABNORMAL HIGH (ref 65–99)
Glucose-Capillary: 121 mg/dL — ABNORMAL HIGH (ref 65–99)

## 2016-09-13 MED ORDER — TRAMADOL HCL 50 MG PO TABS: 50.0000 mg | ORAL_TABLET | Freq: Four times a day (QID) | ORAL | 1 refills | Status: DC | PRN

## 2016-09-13 MED ORDER — HYDROXYUREA 500 MG PO CAPS: 1000.0000 mg | ORAL_CAPSULE | Freq: Two times a day (BID) | ORAL | 1 refills | Status: DC

## 2016-09-13 MED ORDER — BISACODYL 10 MG RE SUPP: 10.0000 mg | Freq: Every day | RECTAL | 0 refills | Status: DC | PRN

## 2016-09-13 MED ORDER — ALLOPURINOL 100 MG PO TABS: 100.0000 mg | ORAL_TABLET | Freq: Two times a day (BID) | ORAL | 1 refills | Status: AC

## 2016-09-13 MED ORDER — HYDROXYUREA 500 MG PO CAPS
1000.0000 mg | ORAL_CAPSULE | Freq: Two times a day (BID) | ORAL | Status: DC
  Administered 2016-09-13: 1000 mg via ORAL
  Filled 2016-09-13: qty 2

## 2016-09-13 NOTE — Progress Notes (Signed)
Subjective: Trying to go to bathroom Still with nasal packing in   Objective: Vitals:   09/12/16 1419 09/12/16 2135 09/13/16 0435 09/13/16 0645  BP: (!) 128/45 (!) 142/49  (!) 169/59  Pulse: 60 62  73  Resp: 18 20  20   Temp: 98.1 F (36.7 C) 98.8 F (37.1 C)  97.6 F (36.4 C)  TempSrc: Oral Oral  Oral  SpO2: 95% 92%  93%  Weight:   91.5 kg (201 lb 11.2 oz)   Height:       Weight change: 1.769 kg (3 lb 14.4 oz)  Intake/Output Summary (Last 24 hours) at 09/13/16 1050 Last data filed at 09/13/16 0500  Gross per 24 hour  Intake             1560 ml  Output              250 ml  Net             1310 ml   I/O  - 8.2 L  General: Alert, awake, oriented x3, in no acute distress Neck:  JVP is normal Heart: Regular rate and rhythm, without murmurs, rubs, gallops.  Lungs: Clear to auscultation.  No rales or wheezes. Exemities:  No edema.   Neuro: Grossly intact, nonfocal.   Lab Results: Results for orders placed or performed during the hospital encounter of 09/03/16 (from the past 24 hour(s))  Glucose, capillary     Status: Abnormal   Collection Time: 09/12/16 11:39 AM  Result Value Ref Range   Glucose-Capillary 166 (H) 65 - 99 mg/dL  Glucose, capillary     Status: Abnormal   Collection Time: 09/12/16  5:05 PM  Result Value Ref Range   Glucose-Capillary 160 (H) 65 - 99 mg/dL  Glucose, capillary     Status: Abnormal   Collection Time: 09/12/16 10:26 PM  Result Value Ref Range   Glucose-Capillary 133 (H) 65 - 99 mg/dL  CBC     Status: Abnormal   Collection Time: 09/13/16  4:33 AM  Result Value Ref Range   WBC 75.7 (HH) 4.0 - 10.5 K/uL   RBC 3.51 (L) 4.22 - 5.81 MIL/uL   Hemoglobin 10.5 (L) 13.0 - 17.0 g/dL   HCT 32.2 (L) 39.0 - 52.0 %   MCV 91.7 78.0 - 100.0 fL   MCH 29.9 26.0 - 34.0 pg   MCHC 32.6 30.0 - 36.0 g/dL   RDW 19.4 (H) 11.5 - 15.5 %   Platelets 114 (L) 150 - 400 K/uL  Basic metabolic panel     Status: Abnormal   Collection Time: 09/13/16  4:33 AM  Result  Value Ref Range   Sodium 135 135 - 145 mmol/L   Potassium 4.0 3.5 - 5.1 mmol/L   Chloride 102 101 - 111 mmol/L   CO2 24 22 - 32 mmol/L   Glucose, Bld 111 (H) 65 - 99 mg/dL   BUN 27 (H) 6 - 20 mg/dL   Creatinine, Ser 2.47 (H) 0.61 - 1.24 mg/dL   Calcium 9.0 8.9 - 10.3 mg/dL   GFR calc non Af Amer 22 (L) >60 mL/min   GFR calc Af Amer 25 (L) >60 mL/min   Anion gap 9 5 - 15  Glucose, capillary     Status: Abnormal   Collection Time: 09/13/16  7:41 AM  Result Value Ref Range   Glucose-Capillary 102 (H) 65 - 99 mg/dL    Studies/Results: No results found.  Medications:  . allopurinol  100 mg  Oral BID  . amoxicillin-clavulanate  1 tablet Oral BID  . benzonatate  100 mg Oral BID  . carvedilol  6.25 mg Oral BID WC  . colchicine  0.3 mg Oral Daily  . guaiFENesin  600 mg Oral BID  . hydroxyurea  1,000 mg Oral BID  . insulin aspart  0-5 Units Subcutaneous QHS  . insulin aspart  0-9 Units Subcutaneous TID WC  . levothyroxine  125 mcg Oral QAC breakfast  . montelukast  10 mg Oral QHS  . nitroGLYCERIN  0.6 mg Transdermal Daily  . pantoprazole  40 mg Oral QAC breakfast  . pravastatin  20 mg Oral QHS  . tamsulosin  0.4 mg Oral Daily  . traMADol  50 mg Oral Q6H     1  PAF continue coreg   2  Acute on chronic systolic CHF    Ok to resume home lasix dose   3  HTN  Ok given AML and co morbidities would not be too aggressive about lowering   Ok to d/c home if has BM and packing comes out with no re bleed Needs outpatient Hospice f/u and ? [facility] or [facility] Drue Stager who is in [place] now    Jenkins Rouge 09/13/2016, 10:50 AM Patient ID: Brian Mcmillan, male   DOB: November 04, 1924, 82 y.o.   MRN: [id number]

## 2016-09-13 NOTE — Progress Notes (Signed)
Physical Therapy Treatment Patient Details Name: ARIHAN VANDECAR MRN: [id number] DOB: 1925/07/17 Today's Date: 09/13/2016    History of Present Illness KEY BRULE is a 82 y.o. male with a past medical history significant for ischemic heart disease and CHF EF 45%, HTN, hypothyroidism, renal CA s/p ablation, hx of TIA, MGUS and NIDDM who presents 11/24/17with bilateral ankle pain for three weeks and is found to have elevated creatinine, new onset Afib, elevated troponin,  Acute on chronic systolic and diastolic CHF (congestive heart failure) ([place]) / Acute respiratory failure with hypoxia    PT Comments    Patient was seen in bed upon arrival with family present. Performed supine to sit x min guard/minA. Increased time required with min guard/assist to raise trunk in bed. Required VC's to shift hips all the way around in bed once sitting. Performed sit to stand x minA with assist required to rise and steady and VC's for UE placement. Gait x 20 ft., 40 ft x min guard.  VC's for RW position and to maintain upright posture. HR remained between 68bpm and 76 bpm during ambulation. Required 2 sitting rest breaks. Patient was repositioned in recliner following ambulation with all needs in reach. Patient is limited by decreased activity tolerance, endurance, fatigue, and medical conditions.   Follow Up Recommendations  Home health PT;Supervision/Assistance - 24 hour     Equipment Recommendations  None recommended by PT    Recommendations for Other Services       Precautions / Restrictions Precautions Precautions: Fall Precaution Comments: monitor HR    Mobility  Bed Mobility Overal bed mobility: Needs Assistance Bed Mobility: Supine to Sit     Supine to sit: HOB elevated;Min guard     General bed mobility comments: increased time required. min guard to assist trunk in bed.   Transfers Overall transfer level: Needs assistance Equipment used: None Transfers: Sit to/from Stand Sit  to Stand: Min guard;Min assist         General transfer comment: minA to rise and steady and VC's for UE placement. VC's to shift hips completely around so that BLE's would touch the floor.   Ambulation/Gait Ambulation/Gait assistance: Min guard Ambulation Distance (Feet): 60 Feet (20 ft, 40 ft. ) Assistive device: Rolling walker (2 wheeled) Gait Pattern/deviations: Step-to pattern;Step-through pattern;Trunk flexed;Decreased stride length Gait velocity: decreased Gait velocity interpretation: Below normal speed for age/gender General Gait Details: VC's for RW position and to maintain upright posture. HR was between 68-76 bpm during ambulation. Required 2 sitting rest breaks.    Stairs            Wheelchair Mobility    Modified Rankin (Stroke Patients Only)       Balance                                    Cognition Arousal/Alertness: Awake/alert Behavior During Therapy: WFL for tasks assessed/performed Overall Cognitive Status: Within Functional Limits for tasks assessed                      Exercises      General Comments        Pertinent Vitals/Pain Pain Assessment: No/denies pain Pain Intervention(s): Monitored during session;Repositioned    Home Living                      Prior Function  PT Goals (current goals can now be found in the care plan section) Progress towards PT goals: Progressing toward goals    Frequency    Min 3X/week      PT Plan Current plan remains appropriate    Co-evaluation             End of Session Equipment Utilized During Treatment: Gait belt Activity Tolerance: Patient tolerated treatment well;Patient limited by fatigue Patient left: in chair;with call bell/phone within reach;with nursing/sitter in room;with chair alarm set;with family/visitor present     Time:  - 11:06 - 11;30    Charges:    1 gt   1 ta                   G CodesHall Busing,  SPTA [facility] [phone]  Present and agree with above  Rica Koyanagi  PTA [facility]  Acute  [facility] Pager      [phone]

## 2016-09-13 NOTE — Discharge Summary (Signed)
Discharge Summary  Brian Mcmillan HTD:[id number] DOB: May 09, 1925  PCP: [name], MD  Admit date: 09/03/2016 Discharge date: 09/13/2016  Time spent: 25 minutes   Recommendations for Outpatient Follow-up:  1. Patient is being discharged with home health services 2. He's been given a referral for home hospice, but at this time, he himself is not yet ready for this 3. New medication: Hydroxyurea 1000 mg by mouth twice a day 4. New medication: Allopurinol 100 mg by mouth twice a day 5. New medication: When necessary Ultram 50 mg every 6 hours for pain  Discharge Diagnoses:  Active Hospital Problems   Diagnosis Date Noted  . Acute on chronic systolic CHF (congestive heart failure) ([place]) 09/04/2016  . Hyperuricemia   . 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 AKI (acute kidney injury) ([place])   . Troponin level elevated   . Leukocytosis   . Lobar pneumonia, unspecified organism ([place])   . Acute on chronic kidney failure ([place]) 09/04/2016  . Thrombocytopenia ([place]) 09/04/2016  . Cellulitis 09/04/2016  . Acute on chronic combined systolic and diastolic CHF, NYHA class 2 ([place]) 09/04/2016  . Paroxysmal atrial fibrillation (HCC)   . Coronary artery disease due to lipid rich plaque 09/25/2009  . NOSEBLEED 09/22/2009  . Hypothyroidism, acquired 06/29/2009  . Essential hypertension 06/29/2009    Resolved Hospital Problems   Diagnosis Date Noted Date Resolved  No resolved problems to display.    Discharge Condition: Stable, although patient is dying and likely has less than a month   Diet recommendation:   heart healthy   Vitals:   09/13/16 0645 09/13/16 1301  BP: (!) 169/59   Pulse: 73 65  Resp: 20   Temp: 97.6 F (36.4 C)     History of present illness:   82 year old male with past mental history of ischemic heart disease and chronic systolic CHF with ejection fraction of 45% as well as history of renal cancer status post ablation who was admitted on 11/24 after  having bilateral ankle pain 3 weeks and had several falls over the past month. At that time, patient reported a fever at home. Emergency room, patient was afebrile but noted to have a significant leukocytosis at 22. He was also found to have new onset atrial fibrillation. There was a? Of right foot cellulitis due to edematous appearance and soft tissue swelling on x-ray and patient was started on antibiotics.    Hospital Course:  Epistaxis: On 12/1 in the evening, patient started having continued persistent nosebleed from his left nares. He's had a long extensive history of nosebleeds that occur randomly and at times have required trips to the emergency room or ENT for cauterization. Fortunately, was able to get assistance from one of the ER PAs  who was able to place an anterior/posterior Rhino Rocket that evening. ENT advised keeping it in place for several days. He was able to be removed on 12/4. Patient was put on Augmentin for staph coverage while packing in place    Hypothyroidism, acquired continue Synthroid   Essential hypertension   Coronary artery disease due to lipid rich plaque   Acute on stage III chronic kidney failure ([place]): Secondary to diuresis. Creatinine baseline 2.4 and aggressive diuresis for heart failure, trended upward peaking a size 3.78. Is also likely some component secondary to cell turnover from AML. With allopurinol started and holding Lasix, creatinine trended down and by day of discharge back down to baseline at 2.47    Thrombocytopenia ([place]): Stable.  Likely secondary to AML.      Acute on chronic combined systolic and diastolic CHF, NYHA class 2 ([place]): He was also found to be in mild systolic heart failure and started on diuretics by cardiology.  Patient has responded well to Lasix, diuresing over 70 L since admission and weight down 5 pounds. His creatinine has jumped up some so Lasix was put on hold. Creatinine peaked to as high as 3.7 and then started to come down  and by day of discharge down to 2.5    Paroxysmal atrial fibrillation (HCC) new-onset: Asymptomatic at this time and has since converted to normal sinus rhythm. His chads 2 score is at 5. Patient is at a previous history of severe nosebleeds and does not want to be on any anticoagulation. Continue Coreg which has been titrated up  Acute myelogenous leukemia: His white count however has continued to trend upward and is currently at 37. Peripheral smear noted by pathology a primary leukocytosis with atypical monocytes. Hematology consulted and patient underwent bone marrow biopsy on morning of 11/30.  Unfortunately, patient's pathology came back which oncology confirmed positive for AML. He is unfortunately not a good candidate for aggressive treatment and his best options are limited with focus on controlling leukocyte proliferation as best as possible. Patient is a very poor prognosis and according to oncology, has only about a month left. She took this knees understandably quite high. Palliative care was consulted. Patient at this time wishes to be a full code although he says he does understand what is going on and also says he is not afraid of dying. She's been given the contact information for home hospice referral. White blood cell count continued to climb and on day of discharge was at 75. Hydroxyurea increased to 1000 by mouth twice a day.  Secondary hyperuricemia: Serum uric acid level on 11/30 at 14.3. From AML. Started on allopurinol. Follow-up uric acid level several days later Noted improvement down to 9.2. Prescription given for allopurinol at home.    Lobar pneumonia, unspecified organism [facility]): On empiric Levaquin since 11/26, stopped 11/30. Blood cultures with no growth.    Troponin level elevated: No evidence of ischemia, felt to be secondary to elevated heart rate   Consultants:  HematologyOncology  Palliative medicine  Cardiology    Procedures  Bone marrow biopsy done  on 11/30: Features consistent with AML   Discharge Exam: BP (!) 169/59 (BP Location: Right Arm)   Pulse 65 Comment: rest, 76 bpm following ambulation  Temp 97.6 F (36.4 C) (Oral)   Resp 20   Ht 6' 2"  (1.88 m)   Wt 91.5 kg (201 lb 11.2 oz)   SpO2 93%   BMI 25.90 kg/m   General: alert and oriented 3, mild distress from diagnosis   Cardiovascular: regular rate and rhythm, S1-S2   Respiratory: Clear to auscultation bilaterally    Discharge Instructions You were cared for by a hospitalist during your hospital stay. If you have any questions about your discharge medications or the care you received while you were in the hospital after you are discharged, you can call the unit and asked to speak with the hospitalist on call if the hospitalist that took care of you is not available. Once you are discharged, your primary care physician will handle any further medical issues. Please note that NO REFILLS for any discharge medications will be authorized once you are discharged, as it is imperative that you return to your primary care physician (or  establish a relationship with a primary care physician if you do not have one) for your aftercare needs so that they can reassess your need for medications and monitor your lab values.     Medication List    TAKE these medications   acetaminophen 500 MG tablet Commonly known as:  TYLENOL Take 500 mg by mouth every 6 (six) hours as needed for moderate pain.   albuterol-ipratropium 18-103 MCG/ACT inhaler Commonly known as:  COMBIVENT Inhale 2-4 puffs into the lungs 2 (two) times daily as needed for wheezing.   allopurinol 100 MG tablet Commonly known as:  ZYLOPRIM Take 1 tablet (100 mg total) by mouth 2 (two) times daily.   bisacodyl 10 MG suppository Commonly known as:  DULCOLAX Place 1 suppository (10 mg total) rectally daily as needed for moderate constipation.   carvedilol 6.25 MG tablet Commonly known as:  COREG Take 6.25 mg by mouth 2  (two) times daily with a meal.   Fish Oil 1000 MG Cpdr Take 1,000 mg by mouth daily.   fluticasone 50 MCG/ACT nasal spray Commonly known as:  FLONASE Place 2 sprays into both nostrils at bedtime as needed for allergies.   furosemide 40 MG tablet Commonly known as:  LASIX Take 20 mg by mouth daily.   hydroxyurea 500 MG capsule Commonly known as:  HYDREA Take 2 capsules (1,000 mg total) by mouth 2 (two) times daily. May take with food to minimize GI side effects.   ipratropium 0.03 % nasal spray Commonly known as:  ATROVENT Place 2 sprays into both nostrils 3 (three) times daily.   levothyroxine 125 MCG tablet Commonly known as:  SYNTHROID, LEVOTHROID Take 125 mcg by mouth daily.   montelukast 10 MG tablet Commonly known as:  SINGULAIR Take 10 mg by mouth at bedtime.   multivitamin-iron-minerals-folic acid Tabs tablet Take 1 tablet by mouth daily.   nitroGLYCERIN 0.6 mg/hr patch Commonly known as:  NITRODUR - Dosed in mg/24 hr Place 0.6 mg onto the skin daily. What changed:  Another medication with the same name was changed. Make sure you understand how and when to take each.   nitroGLYCERIN 0.4 MG SL tablet Commonly known as:  NITROSTAT Place 1 tablet (0.4 mg total) under the tongue as needed. What changed:  reasons to take this   pantoprazole 40 MG tablet Commonly known as:  PROTONIX Take 1 tablet (40 mg total) by mouth daily before breakfast.   pravastatin 20 MG tablet Commonly known as:  PRAVACHOL Take 20 mg by mouth at bedtime.   tamsulosin 0.4 MG Caps capsule Commonly known as:  FLOMAX Take 0.4 mg by mouth daily.   tiotropium 18 MCG inhalation capsule Commonly known as:  SPIRIVA Place 18 mcg into inhaler and inhale daily.   traMADol 50 MG tablet Commonly known as:  ULTRAM Take 1 tablet (50 mg total) by mouth every 6 (six) hours as needed for moderate pain.   vitamin B-12 1000 MCG tablet Commonly known as:  CYANOCOBALAMIN Take 1,000 mcg by mouth  daily.   Vitamin D3 5000 units Caps Take 5,000 Units by mouth daily.      Allergies  Allergen Reactions  . Ranolazine Shortness Of Breath  . Simvastatin Other (See Comments)    fatigue  . Aspirin Other (See Comments)    Causes severe bleeding  . Contrast Media [Iodinated Diagnostic Agents] Other (See Comments)    Will shut kidneys down  . Ibuprofen Other (See Comments)    Nose bleeds  .  Ioxaglate Other (See Comments)    Will shut kidneys down   Follow-up Information    Vivien Presto, MD .   Specialty:  Internal Medicine Contact information: [place] [address] [place] [facility] up.   Why:  [place], physical therapy,aide/social worker Contact information: [place] [address] [phone]            The results of significant diagnostics from this hospitalization (including imaging, microbiology, ancillary and laboratory) are listed below for reference.    Significant Diagnostic Studies: Dg Chest 2 View  Result Date: 09/03/2016 CLINICAL DATA:  Bilateral ankle swelling worse over the past week. History of coronary artery disease and hypertension. Former smoker. Cough and fever. EXAM: CHEST  2 VIEW COMPARISON:  11/28/2015 FINDINGS: Shallow inspiration. Mild cardiac enlargement with pulmonary vascular congestion. Slight interstitial pattern to the lung bases may represent early interstitial edema. No focal consolidation. No blunting of costophrenic angles. No pneumothorax. Calcified and tortuous aorta. Degenerative changes in the spine. IMPRESSION: Mild congestive changes in the heart and lungs with mild interstitial edema. No effusion or consolidation. Electronically Signed   By: Lucienne Capers M.D.   On: 09/03/2016 21:43   Dg Ankle Complete Left  Result Date: 09/03/2016 CLINICAL DATA:  Ankle swelling EXAM: LEFT ANKLE COMPLETE - 3+ VIEW COMPARISON:  08/03/2016 FINDINGS: Avulsion  injury medial malleolus as before, probably chronic. Slight lucency in the lateral talar dome. Probable old avulsion injury fibular malleolus. Tiny os ossific densities project over medial joint space, cannot exclude tiny loose bodies. Soft tissue swelling, moderate. Mild cortical irregularity of the anterior inferior tibia, partially related to degenerative changes. IMPRESSION: 1. Moderate soft tissue swelling. No definite acute osseous abnormality. 2. Probable old avulsion injuries of the medial and lateral malleolus. 3. Cortical irregularity of the anterior inferior tibia, likely on the basis of degenerative changes. 4. Questionable OCD lesion lateral tailor dome. Electronically Signed   By: Donavan Foil M.D.   On: 09/03/2016 21:28   Dg Ankle Complete Right  Result Date: 09/03/2016 CLINICAL DATA:  Bilateral ankle swelling, worse over the past week. Patient is on a diuretic. Fall. EXAM: RIGHT ANKLE - COMPLETE 3+ VIEW COMPARISON:  Right foot 04/10/2005 FINDINGS: Focal linear lucency and cortical irregularity at the medial malleolus may represent avulsion fracture. Medial soft tissue swelling about the right ankle. No acute displaced fractures are identified. Degenerative changes in the right ankle and intertarsal joints. No focal bone lesion or bone destruction. Vascular calcifications in the soft tissues. IMPRESSION: Focal cortical changes of the medial malleolus may indicate an avulsion fracture. Soft tissue swelling. Degenerative changes. Electronically Signed   By: Lucienne Capers M.D.   On: 09/03/2016 21:26   Ct Biopsy  Result Date: 09/09/2016 INDICATION: Leukocytosis EXAM: CT BIOPSY MEDICATIONS: None. ANESTHESIA/SEDATION: Fentanyl 50 mcg IV; Versed 2 mg IV Moderate Sedation Time:  14 The patient was continuously monitored during the procedure by the interventional radiology nurse under my direct supervision. FLUOROSCOPY TIME:  Fluoroscopy Time:  minutes  seconds ( mGy). COMPLICATIONS: None  immediate. PROCEDURE: Informed written consent was obtained from the patient after a thorough discussion of the procedural risks, benefits and alternatives. All questions were addressed. Maximal Sterile Barrier Technique was utilized including caps, mask, sterile gowns, sterile gloves, sterile drape, hand hygiene and skin antiseptic. A timeout was performed prior to the initiation of the procedure. Under CT guidance, a(n) 11 gauge guide needle  was advanced into the right iliac bone via posterior approach. Aspirates and a core were obtain. Post biopsy images demonstrate no hemorrhage. Patient tolerated the procedure well without complication. Vital sign monitoring by nursing staff during the procedure will continue as patient is in the special procedures unit for post procedure observation. FINDINGS: The images document guide needle placement within the right iliac bone. Post biopsy images demonstrate no hemorrhage. IMPRESSION: Successful CT-guided bone marrow aspirate and core from the right iliac bone. Electronically Signed   By: Marybelle Killings M.D.   On: 09/09/2016 11:41   Dg Foot Complete Left  Result Date: 09/03/2016 CLINICAL DATA:  Bilateral ankle swelling worse over the past week. Fall. EXAM: LEFT FOOT - COMPLETE 3+ VIEW COMPARISON:  None. FINDINGS: Soft tissue swelling over the left forefoot. Mild degenerative changes in the intertarsal and first metatarsal-phalangeal joints. No acute fracture or dislocation. No focal bone lesion or bone destruction. IMPRESSION: Mild soft tissue swelling. Degenerative changes. No acute bony abnormalities. Electronically Signed   By: Lucienne Capers M.D.   On: 09/03/2016 21:29   Dg Foot Complete Right  Result Date: 09/03/2016 CLINICAL DATA:  Bilateral ankle swelling, worse over the past week. Fall. EXAM: RIGHT FOOT COMPLETE - 3+ VIEW COMPARISON:  Right foot 04/10/2005 FINDINGS: Soft tissue swelling over the dorsum of the right foot. Mild degenerative changes in the  intertarsal joints. No evidence of acute fracture or dislocation. Vague focal lucency in the calcaneus is unchanged since prior study and probably represents a small bone cyst or lipoma. No destructive or expansile bone lesions. IMPRESSION: Dorsal soft tissue swelling over the right foot. Mild degenerative changes. No acute bony abnormalities. Electronically Signed   By: Lucienne Capers M.D.   On: 09/03/2016 21:32   Dg Hip Unilat With Pelvis 2-3 Views Left  Result Date: 09/03/2016 CLINICAL DATA:  Hip pain.  Injury. EXAM: DG HIP (WITH OR WITHOUT PELVIS) 2-3V LEFT COMPARISON:  None. FINDINGS: Degenerative changes lumbar spine and both hips. No acute bony abnormality. No evidence of fracture or dislocation. Aortoiliac atherosclerotic vascular calcification. Stable calcific densities noted over the left abdomen. Surgical clips in the pelvis. IMPRESSION: 1. No acute bony or joint abnormality. Degenerative changes lumbar spine and both hips. No evidence of fracture. 2. Aortoiliac atherosclerotic vascular disease . Electronically Signed   By: Marcello Moores  Register   On: 09/03/2016 16:38    Microbiology: Recent Results (from the past 240 hour(s))  Culture, blood (routine x 2)     Status: None   Collection Time: 09/03/16 10:29 PM  Result Value Ref Range Status   Specimen Description BLOOD RIGHT ANTECUBITAL  Final   Special Requests BOTTLES DRAWN AEROBIC AND ANAEROBIC 5 CC  Final   Culture   Final    NO GROWTH 5 DAYS Performed at [facility]    Report Status 09/08/2016 FINAL  Final  Culture, blood (routine x 2)     Status: None   Collection Time: 09/03/16 10:30 PM  Result Value Ref Range Status   Specimen Description BLOOD LEFT ANTECUBITAL  Final   Special Requests IN PEDIATRIC BOTTLE  4CC  Final   Culture   Final    NO GROWTH 5 DAYS Performed at [facility]    Report Status 09/09/2016 FINAL  Final     Labs: Basic Metabolic Panel:  Recent Labs Lab 09/08/16 0544 09/09/16 0534  09/10/16 0451 09/11/16 0535 09/12/16 0539 09/13/16 0433  NA 136 134* 136 138 136 135  K 4.4 4.1  3.5 4.0 3.5 4.0  CL 101 99* 100* 104 104 102  CO2 25 25 24 23 24 24   GLUCOSE 93 100* 111* 107* 108* 111*  BUN 49* 55* 57* 46* 33* 27*  CREATININE 3.69* 3.78* 3.64* 3.10* 2.42* 2.47*  CALCIUM 9.3 9.3 9.1 8.8* 9.0 9.0  MG 1.8  --   --   --   --   --    Liver Function Tests: No results for input(s): AST, ALT, ALKPHOS, BILITOT, PROT, ALBUMIN in the last 168 hours. No results for input(s): LIPASE, AMYLASE in the last 168 hours. No results for input(s): AMMONIA in the last 168 hours. CBC:  Recent Labs Lab 09/08/16 0544 09/09/16 0534 09/11/16 0535 09/12/16 0539 09/13/16 0433  WBC 37.2* 41.5* 49.9* 66.0* 75.7*  NEUTROABS  --  7.9*  --   --   --   HGB 10.8* 10.4* 9.9* 10.2* 10.5*  HCT 32.2* 31.1* 30.2* 30.9* 32.2*  MCV 89.7 89.4 89.6 91.4 91.7  PLT 115* 122* 111* 112* 114*   Cardiac Enzymes: No results for input(s): CKTOTAL, CKMB, CKMBINDEX, TROPONINI in the last 168 hours. BNP: BNP (last 3 results)  Recent Labs  09/03/16 1630  BNP 670.2*    ProBNP (last 3 results) No results for input(s): PROBNP in the last 8760 hours.  CBG:  Recent Labs Lab 09/12/16 1139 09/12/16 1705 09/12/16 2226 09/13/16 0741 09/13/16 1144  GLUCAP 166* 160* 133* 102* 121*       Signed:  Annita Brod, MD [facility] 09/13/2016, 1:26 PM

## 2016-09-13 NOTE — Progress Notes (Signed)
Patient complaining of constipation, MD ordered soap suds enema and disimpaction. Soap suds enema given and disimpaction attempted. No stool removed during disimpaction. About 5 minutes after disimpaction attempt and enema given patient requested to use BSC. Patient had a medium amount of soft brown stool in the [facility]. Patient states "I feel like a new man." Will continue to monitor until discharged.

## 2016-09-13 NOTE — Care Management Important Message (Signed)
Important Message  Patient Details  Name: Brian Mcmillan MRN: [id number] Date of Birth: Dec 31, 1924   Medicare Important Message Given:  Yes    Camillo Flaming 09/13/2016, 10:19 AMImportant Message  Patient Details  Name: Brian Mcmillan MRN: [id number] Date of Birth: [birth year]   Medicare Important Message Given:  Yes    Camillo Flaming 09/13/2016, 10:19 AM

## 2016-09-13 NOTE — Care Management Note (Signed)
Case Management Note  Patient Details  Name: Brian Mcmillan MRN: [id number] Date of Birth: 1925/02/20  Subjective/Objective: Spoke to patient/spouse/son-[name] in rm about [name] vs Home hospice services(per attending conversation during rounds), no cons for home hospice services,also TC Dr. Netta Corrigan just give home hospice provider resource list to patient since patient still undecided about home hospice services-has questions about DNR, & will he have PT. Patient/family agree to Home w/HHC-[facility] which he was already getting-they will also help with answering any questions about home hospice, & transition  if needed-I have spoke to rep [name] already. HHC orders-RN/PT/aide/sw already placed. Attending/Family agree to d/c planNo further CM needs.                   Action/Plan:d/c home w/HHC.   Expected Discharge Date:                  Expected Discharge Plan:  [place]  In-House Referral:     Discharge planning Services  CM Consult  Post Acute Care Choice:  Durable Medical Equipment (w/c) Choice offered to:  Patient  DME Arranged:    DME Agency:     HH Arranged:  RN, PT, Nurse's Aide, Social Work [employer] Agency:  [employer]  Status of Service:  Completed, signed off  If discussed at [employer], dates discussed:    Additional Comments:  Dessa Phi, RN 09/13/2016, 12:47 PM

## 2016-09-13 NOTE — Progress Notes (Signed)
CTM notifies that around 2225 pt had 6 beats of wide QRS complexes, returns to baseline then a couple minutes later had 33 wide complexes then returns to SR rate in the 80's.  RN at bedside during episode and pt is asymptomatic, VSS.    CTM reports this is not the first time pt has had runs of Vtach.  Will continue to monitor

## 2016-09-14 LAB — BCR-ABL1, CML/ALL, PCR, QUANT

## 2016-09-15 DIAGNOSIS — R269 Unspecified abnormalities of gait and mobility: Secondary | ICD-10-CM | POA: Diagnosis not present

## 2016-09-15 DIAGNOSIS — E119 Type 2 diabetes mellitus without complications: Secondary | ICD-10-CM | POA: Diagnosis not present

## 2016-09-15 DIAGNOSIS — I2510 Atherosclerotic heart disease of native coronary artery without angina pectoris: Secondary | ICD-10-CM | POA: Diagnosis not present

## 2016-09-15 DIAGNOSIS — E039 Hypothyroidism, unspecified: Secondary | ICD-10-CM | POA: Diagnosis not present

## 2016-09-15 DIAGNOSIS — J449 Chronic obstructive pulmonary disease, unspecified: Secondary | ICD-10-CM | POA: Diagnosis not present

## 2016-09-15 DIAGNOSIS — C649 Malignant neoplasm of unspecified kidney, except renal pelvis: Secondary | ICD-10-CM | POA: Diagnosis not present

## 2016-09-15 DIAGNOSIS — I110 Hypertensive heart disease with heart failure: Secondary | ICD-10-CM | POA: Diagnosis not present

## 2016-09-15 DIAGNOSIS — Z8673 Personal history of transient ischemic attack (TIA), and cerebral infarction without residual deficits: Secondary | ICD-10-CM | POA: Diagnosis not present

## 2016-09-15 DIAGNOSIS — I5020 Unspecified systolic (congestive) heart failure: Secondary | ICD-10-CM | POA: Diagnosis not present

## 2016-09-15 DIAGNOSIS — I4891 Unspecified atrial fibrillation: Secondary | ICD-10-CM | POA: Diagnosis not present

## 2016-09-15 DIAGNOSIS — L03115 Cellulitis of right lower limb: Secondary | ICD-10-CM | POA: Diagnosis not present

## 2016-09-16 LAB — CHROMOSOME ANALYSIS, BONE MARROW

## 2016-09-17 DIAGNOSIS — I5020 Unspecified systolic (congestive) heart failure: Secondary | ICD-10-CM | POA: Diagnosis not present

## 2016-09-17 DIAGNOSIS — E119 Type 2 diabetes mellitus without complications: Secondary | ICD-10-CM | POA: Diagnosis not present

## 2016-09-17 DIAGNOSIS — I110 Hypertensive heart disease with heart failure: Secondary | ICD-10-CM | POA: Diagnosis not present

## 2016-09-17 DIAGNOSIS — I4891 Unspecified atrial fibrillation: Secondary | ICD-10-CM | POA: Diagnosis not present

## 2016-09-17 DIAGNOSIS — L03115 Cellulitis of right lower limb: Secondary | ICD-10-CM | POA: Diagnosis not present

## 2016-09-17 DIAGNOSIS — R269 Unspecified abnormalities of gait and mobility: Secondary | ICD-10-CM | POA: Diagnosis not present

## 2016-09-21 DIAGNOSIS — I110 Hypertensive heart disease with heart failure: Secondary | ICD-10-CM | POA: Diagnosis not present

## 2016-09-21 DIAGNOSIS — L03115 Cellulitis of right lower limb: Secondary | ICD-10-CM | POA: Diagnosis not present

## 2016-09-21 DIAGNOSIS — I4891 Unspecified atrial fibrillation: Secondary | ICD-10-CM | POA: Diagnosis not present

## 2016-09-21 DIAGNOSIS — E119 Type 2 diabetes mellitus without complications: Secondary | ICD-10-CM | POA: Diagnosis not present

## 2016-09-21 DIAGNOSIS — I5020 Unspecified systolic (congestive) heart failure: Secondary | ICD-10-CM | POA: Diagnosis not present

## 2016-09-21 DIAGNOSIS — R269 Unspecified abnormalities of gait and mobility: Secondary | ICD-10-CM | POA: Diagnosis not present

## 2016-09-22 DIAGNOSIS — I4891 Unspecified atrial fibrillation: Secondary | ICD-10-CM | POA: Diagnosis not present

## 2016-09-22 DIAGNOSIS — L03115 Cellulitis of right lower limb: Secondary | ICD-10-CM | POA: Diagnosis not present

## 2016-09-22 DIAGNOSIS — I5020 Unspecified systolic (congestive) heart failure: Secondary | ICD-10-CM | POA: Diagnosis not present

## 2016-09-22 DIAGNOSIS — I110 Hypertensive heart disease with heart failure: Secondary | ICD-10-CM | POA: Diagnosis not present

## 2016-09-22 DIAGNOSIS — R269 Unspecified abnormalities of gait and mobility: Secondary | ICD-10-CM | POA: Diagnosis not present

## 2016-09-22 DIAGNOSIS — E119 Type 2 diabetes mellitus without complications: Secondary | ICD-10-CM | POA: Diagnosis not present

## 2016-09-24 DIAGNOSIS — I4891 Unspecified atrial fibrillation: Secondary | ICD-10-CM | POA: Diagnosis not present

## 2016-09-24 DIAGNOSIS — I5020 Unspecified systolic (congestive) heart failure: Secondary | ICD-10-CM | POA: Diagnosis not present

## 2016-09-24 DIAGNOSIS — R269 Unspecified abnormalities of gait and mobility: Secondary | ICD-10-CM | POA: Diagnosis not present

## 2016-09-24 DIAGNOSIS — I110 Hypertensive heart disease with heart failure: Secondary | ICD-10-CM | POA: Diagnosis not present

## 2016-09-24 DIAGNOSIS — E119 Type 2 diabetes mellitus without complications: Secondary | ICD-10-CM | POA: Diagnosis not present

## 2016-09-24 DIAGNOSIS — L03115 Cellulitis of right lower limb: Secondary | ICD-10-CM | POA: Diagnosis not present

## 2016-09-28 ENCOUNTER — Encounter (HOSPITAL_COMMUNITY): Payer: Self-pay | Admitting: Emergency Medicine

## 2016-09-28 ENCOUNTER — Observation Stay (HOSPITAL_COMMUNITY)
Admission: EM | Admit: 2016-09-28 | Discharge: 2016-09-29 | Disposition: A | Discharge status: Home / Self Care | Payer: Medicare Other | Attending: Internal Medicine | Admitting: Internal Medicine

## 2016-09-28 DIAGNOSIS — C9200 Acute myeloblastic leukemia, not having achieved remission: Secondary | ICD-10-CM | POA: Diagnosis not present

## 2016-09-28 DIAGNOSIS — E785 Hyperlipidemia, unspecified: Secondary | ICD-10-CM | POA: Insufficient documentation

## 2016-09-28 DIAGNOSIS — I252 Old myocardial infarction: Secondary | ICD-10-CM | POA: Insufficient documentation

## 2016-09-28 DIAGNOSIS — Z79899 Other long term (current) drug therapy: Secondary | ICD-10-CM | POA: Insufficient documentation

## 2016-09-28 DIAGNOSIS — I2510 Atherosclerotic heart disease of native coronary artery without angina pectoris: Secondary | ICD-10-CM | POA: Diagnosis not present

## 2016-09-28 DIAGNOSIS — I130 Hypertensive heart and chronic kidney disease with heart failure and stage 1 through stage 4 chronic kidney disease, or unspecified chronic kidney disease: Secondary | ICD-10-CM | POA: Diagnosis not present

## 2016-09-28 DIAGNOSIS — D500 Iron deficiency anemia secondary to blood loss (chronic): Secondary | ICD-10-CM | POA: Diagnosis not present

## 2016-09-28 DIAGNOSIS — N179 Acute kidney failure, unspecified: Secondary | ICD-10-CM | POA: Diagnosis not present

## 2016-09-28 DIAGNOSIS — I480 Paroxysmal atrial fibrillation: Secondary | ICD-10-CM | POA: Diagnosis not present

## 2016-09-28 DIAGNOSIS — Z8673 Personal history of transient ischemic attack (TIA), and cerebral infarction without residual deficits: Secondary | ICD-10-CM | POA: Insufficient documentation

## 2016-09-28 DIAGNOSIS — Z85828 Personal history of other malignant neoplasm of skin: Secondary | ICD-10-CM | POA: Insufficient documentation

## 2016-09-28 DIAGNOSIS — E1122 Type 2 diabetes mellitus with diabetic chronic kidney disease: Secondary | ICD-10-CM | POA: Diagnosis not present

## 2016-09-28 DIAGNOSIS — D62 Acute posthemorrhagic anemia: Principal | ICD-10-CM | POA: Diagnosis present

## 2016-09-28 DIAGNOSIS — D696 Thrombocytopenia, unspecified: Secondary | ICD-10-CM | POA: Diagnosis not present

## 2016-09-28 DIAGNOSIS — Z87891 Personal history of nicotine dependence: Secondary | ICD-10-CM | POA: Diagnosis not present

## 2016-09-28 DIAGNOSIS — I5042 Chronic combined systolic (congestive) and diastolic (congestive) heart failure: Secondary | ICD-10-CM | POA: Diagnosis not present

## 2016-09-28 DIAGNOSIS — K219 Gastro-esophageal reflux disease without esophagitis: Secondary | ICD-10-CM | POA: Insufficient documentation

## 2016-09-28 DIAGNOSIS — D472 Monoclonal gammopathy: Secondary | ICD-10-CM | POA: Diagnosis present

## 2016-09-28 DIAGNOSIS — C9500 Acute leukemia of unspecified cell type not having achieved remission: Secondary | ICD-10-CM | POA: Diagnosis not present

## 2016-09-28 DIAGNOSIS — Z85528 Personal history of other malignant neoplasm of kidney: Secondary | ICD-10-CM | POA: Insufficient documentation

## 2016-09-28 DIAGNOSIS — Z66 Do not resuscitate: Secondary | ICD-10-CM | POA: Diagnosis not present

## 2016-09-28 DIAGNOSIS — Z7189 Other specified counseling: Secondary | ICD-10-CM

## 2016-09-28 DIAGNOSIS — N189 Chronic kidney disease, unspecified: Secondary | ICD-10-CM | POA: Diagnosis not present

## 2016-09-28 DIAGNOSIS — R040 Epistaxis: Secondary | ICD-10-CM | POA: Diagnosis not present

## 2016-09-28 DIAGNOSIS — D72829 Elevated white blood cell count, unspecified: Secondary | ICD-10-CM | POA: Diagnosis present

## 2016-09-28 DIAGNOSIS — J449 Chronic obstructive pulmonary disease, unspecified: Secondary | ICD-10-CM | POA: Insufficient documentation

## 2016-09-28 DIAGNOSIS — Z9289 Personal history of other medical treatment: Secondary | ICD-10-CM

## 2016-09-28 DIAGNOSIS — D61818 Other pancytopenia: Secondary | ICD-10-CM | POA: Insufficient documentation

## 2016-09-28 DIAGNOSIS — E039 Hypothyroidism, unspecified: Secondary | ICD-10-CM | POA: Diagnosis present

## 2016-09-28 DIAGNOSIS — I10 Essential (primary) hypertension: Secondary | ICD-10-CM | POA: Diagnosis present

## 2016-09-28 HISTORY — DX: Gastro-esophageal reflux disease without esophagitis: K21.9

## 2016-09-28 HISTORY — DX: Angina pectoris, unspecified: I20.9

## 2016-09-28 HISTORY — DX: Chronic kidney disease, unspecified: N18.9

## 2016-09-28 HISTORY — DX: Gout, unspecified: M10.9

## 2016-09-28 HISTORY — DX: Chronic obstructive pulmonary disease, unspecified: J44.9

## 2016-09-28 HISTORY — DX: Personal history of other medical treatment: Z92.89

## 2016-09-28 HISTORY — DX: Heart failure, unspecified: I50.9

## 2016-09-28 HISTORY — DX: Type 2 diabetes mellitus without complications: E11.9

## 2016-09-28 HISTORY — DX: Acute myeloblastic leukemia, not having achieved remission: C92.00

## 2016-09-28 HISTORY — DX: Anemia, unspecified: D64.9

## 2016-09-28 HISTORY — DX: Acute myocardial infarction, unspecified: I21.9

## 2016-09-28 HISTORY — DX: Pneumonia, unspecified organism: J18.9

## 2016-09-28 HISTORY — DX: Unspecified malignant neoplasm of skin of scalp and neck: C44.40

## 2016-09-28 HISTORY — DX: Personal history of peptic ulcer disease: Z87.11

## 2016-09-28 HISTORY — DX: Malignant neoplasm of unspecified kidney, except renal pelvis: C64.9

## 2016-09-28 LAB — CBC
HCT: 20.9 % — ABNORMAL LOW (ref 39.0–52.0)
HCT: 22 % — ABNORMAL LOW (ref 39.0–52.0)
HEMOGLOBIN: 7.2 g/dL — AB (ref 13.0–17.0)
Hemoglobin: 7 g/dL — ABNORMAL LOW (ref 13.0–17.0)
MCH: 32 pg (ref 26.0–34.0)
MCH: 31.3 pg (ref 26.0–34.0)
MCHC: 33.5 g/dL (ref 30.0–36.0)
MCHC: 32.7 g/dL (ref 30.0–36.0)
MCV: 95.4 fL (ref 78.0–100.0)
MCV: 95.7 fL (ref 78.0–100.0)
PLATELETS: 40 10*3/uL — AB (ref 150–400)
PLATELETS: 50 10*3/uL — AB (ref 150–400)
RBC: 2.19 MIL/uL — ABNORMAL LOW (ref 4.22–5.81)
RBC: 2.3 MIL/uL — ABNORMAL LOW (ref 4.22–5.81)
RDW: 19.9 % — AB (ref 11.5–15.5)
RDW: 20.1 % — ABNORMAL HIGH (ref 11.5–15.5)
WBC: 0.8 10*3/uL — AB (ref 4.0–10.5)
WBC: 1.5 10*3/uL — ABNORMAL LOW (ref 4.0–10.5)

## 2016-09-28 LAB — I-STAT CHEM 8, ED
BUN: 38 mg/dL — ABNORMAL HIGH (ref 6–20)
CALCIUM ION: 1.26 mmol/L (ref 1.15–1.40)
CHLORIDE: 103 mmol/L (ref 101–111)
Creatinine, Ser: 3.4 mg/dL — ABNORMAL HIGH (ref 0.61–1.24)
GLUCOSE: 134 mg/dL — AB (ref 65–99)
HCT: 21 % — ABNORMAL LOW (ref 39.0–52.0)
HEMOGLOBIN: 7.1 g/dL — AB (ref 13.0–17.0)
Potassium: 3.6 mmol/L (ref 3.5–5.1)
Sodium: 141 mmol/L (ref 135–145)
TCO2: 28 mmol/L (ref 0–100)

## 2016-09-28 LAB — SAVE SMEAR

## 2016-09-28 LAB — PROTIME-INR
INR: 1.05
Prothrombin Time: 13.8 seconds (ref 11.4–15.2)

## 2016-09-28 LAB — I-STAT TROPONIN, ED: TROPONIN I, POC: 0.01 ng/mL (ref 0.00–0.08)

## 2016-09-28 LAB — PREPARE RBC (CROSSMATCH)

## 2016-09-28 LAB — ABO/RH: ABO/RH(D): AB POS

## 2016-09-28 MED ORDER — PANTOPRAZOLE SODIUM 40 MG PO TBEC
40.0000 mg | DELAYED_RELEASE_TABLET | Freq: Every day | ORAL | Status: DC
  Administered 2016-09-29: 40 mg via ORAL
  Filled 2016-09-28: qty 1

## 2016-09-28 MED ORDER — MONTELUKAST SODIUM 10 MG PO TABS
10.0000 mg | ORAL_TABLET | Freq: Every day | ORAL | Status: DC
  Administered 2016-09-28: 10 mg via ORAL
  Filled 2016-09-28: qty 1

## 2016-09-28 MED ORDER — IPRATROPIUM-ALBUTEROL 0.5-2.5 (3) MG/3ML IN SOLN: 3.0000 mL | Freq: Four times a day (QID) | RESPIRATORY_TRACT | Status: DC | PRN

## 2016-09-28 MED ORDER — SODIUM CHLORIDE 0.9% FLUSH
3.0000 mL | Freq: Two times a day (BID) | INTRAVENOUS | Status: DC
  Administered 2016-09-28 (×2): 3 mL via INTRAVENOUS

## 2016-09-28 MED ORDER — ONDANSETRON HCL 4 MG/2ML IJ SOLN: 4.0000 mg | Freq: Four times a day (QID) | INTRAMUSCULAR | Status: DC | PRN

## 2016-09-28 MED ORDER — HYDROXYUREA 500 MG PO CAPS: 1000.0000 mg | ORAL_CAPSULE | Freq: Two times a day (BID) | ORAL | Status: DC

## 2016-09-28 MED ORDER — OXYMETAZOLINE HCL 0.05 % NA SOLN
1.0000 | Freq: Once | NASAL | Status: AC
  Administered 2016-09-28: 1 via NASAL
  Filled 2016-09-28: qty 15

## 2016-09-28 MED ORDER — BOOST / RESOURCE BREEZE PO LIQD
1.0000 | Freq: Three times a day (TID) | ORAL | Status: DC
  Administered 2016-09-28: 1 via ORAL

## 2016-09-28 MED ORDER — AMOXICILLIN-POT CLAVULANATE 875-125 MG PO TABS
1.0000 | ORAL_TABLET | Freq: Two times a day (BID) | ORAL | Status: DC
  Administered 2016-09-28 – 2016-09-29 (×3): 1 via ORAL
  Filled 2016-09-28 (×3): qty 1

## 2016-09-28 MED ORDER — ALLOPURINOL 100 MG PO TABS
100.0000 mg | ORAL_TABLET | Freq: Two times a day (BID) | ORAL | Status: DC
  Administered 2016-09-28 – 2016-09-29 (×3): 100 mg via ORAL
  Filled 2016-09-28 (×3): qty 1

## 2016-09-28 MED ORDER — ACETAMINOPHEN 650 MG RE SUPP: 650.0000 mg | Freq: Four times a day (QID) | RECTAL | Status: DC | PRN

## 2016-09-28 MED ORDER — IPRATROPIUM-ALBUTEROL 18-103 MCG/ACT IN AERO: 2.0000 | INHALATION_SPRAY | Freq: Two times a day (BID) | RESPIRATORY_TRACT | Status: DC | PRN

## 2016-09-28 MED ORDER — LEVOTHYROXINE SODIUM 125 MCG PO TABS
125.0000 ug | ORAL_TABLET | Freq: Every day | ORAL | Status: DC
  Administered 2016-09-29: 125 ug via ORAL
  Filled 2016-09-28: qty 1

## 2016-09-28 MED ORDER — PRAVASTATIN SODIUM 20 MG PO TABS
20.0000 mg | ORAL_TABLET | Freq: Every day | ORAL | Status: DC
  Administered 2016-09-28: 20 mg via ORAL
  Filled 2016-09-28: qty 1

## 2016-09-28 MED ORDER — ONDANSETRON HCL 4 MG PO TABS: 4.0000 mg | ORAL_TABLET | Freq: Four times a day (QID) | ORAL | Status: DC | PRN

## 2016-09-28 MED ORDER — ENSURE ENLIVE PO LIQD
237.0000 mL | Freq: Two times a day (BID) | ORAL | Status: DC
  Administered 2016-09-29: 237 mL via ORAL

## 2016-09-28 MED ORDER — TIOTROPIUM BROMIDE MONOHYDRATE 18 MCG IN CAPS
18.0000 ug | ORAL_CAPSULE | Freq: Every day | RESPIRATORY_TRACT | Status: DC
  Administered 2016-09-29: 18 ug via RESPIRATORY_TRACT
  Filled 2016-09-28: qty 5

## 2016-09-28 MED ORDER — BISACODYL 10 MG RE SUPP: 10.0000 mg | Freq: Every day | RECTAL | Status: DC | PRN

## 2016-09-28 MED ORDER — TRANEXAMIC ACID 1000 MG/10ML IV SOLN
500.0000 mg | Freq: Once | INTRAVENOUS | Status: AC
  Administered 2016-09-28: 500 mg via TOPICAL
  Filled 2016-09-28: qty 10

## 2016-09-28 MED ORDER — ACETAMINOPHEN 325 MG PO TABS
650.0000 mg | ORAL_TABLET | Freq: Four times a day (QID) | ORAL | Status: DC | PRN
  Administered 2016-09-28: 650 mg via ORAL
  Filled 2016-09-28: qty 2

## 2016-09-28 MED ORDER — SODIUM CHLORIDE 0.9 % IV SOLN
Freq: Once | INTRAVENOUS | Status: AC
  Administered 2016-09-28: 20:00:00 via INTRAVENOUS

## 2016-09-28 MED ORDER — FUROSEMIDE 20 MG PO TABS
20.0000 mg | ORAL_TABLET | Freq: Every day | ORAL | Status: DC
  Administered 2016-09-29: 20 mg via ORAL
  Filled 2016-09-28: qty 1

## 2016-09-28 MED ORDER — CARVEDILOL 6.25 MG PO TABS
6.2500 mg | ORAL_TABLET | Freq: Two times a day (BID) | ORAL | Status: DC
  Administered 2016-09-28 – 2016-09-29 (×2): 6.25 mg via ORAL
  Filled 2016-09-28 (×2): qty 1

## 2016-09-28 MED ORDER — TAMSULOSIN HCL 0.4 MG PO CAPS
0.4000 mg | ORAL_CAPSULE | Freq: Every day | ORAL | Status: DC
  Administered 2016-09-28 – 2016-09-29 (×2): 0.4 mg via ORAL
  Filled 2016-09-28 (×2): qty 1

## 2016-09-28 MED ORDER — LACTATED RINGERS IV BOLUS (SEPSIS)
1000.0000 mL | Freq: Once | INTRAVENOUS | Status: AC
  Administered 2016-09-28: 1000 mL via INTRAVENOUS

## 2016-09-28 NOTE — H&P (Signed)
History and Physical    Brian BALEY WNI:[id number] DOB: April 22, 1925 DOA: 09/28/2016  PCP: [name], MD Patient coming from: home  Chief Complaint: epistaxis persistent  HPI: Brian Mcmillan is a very pleasant 82 y.o. male with medical history significant for ischemic heart disease and CHF with an EF of 45%, hypertension, hypothyroidism, renal carcinoma status post ablation, history of TIA, MGUS and NIDDM and recent myeloid leukemia, paroxysmal A. Fib nosebleed presents to emergency Department chief complaint of persistent nosebleed. Initial evaluation feels acute blood loss anemia with a hemoglobin 7.1 down from 10.2 15 days ago, continuing nosebleed, acute on chronic kidney disease.  Information is obtained from the patient and his son is at the bedside. He was in his usual state of health until about 12 hours ago he developed sudden nosebleed. Denies headache visual disturbances dizziness syncope or near-syncope. He denies chest pain palpitation shortness of breath. Reports mostly of bleeding from the left nare. He denies abdominal pain nausea vomiting diarrhea constipation melena. He denies dysuria hematuria frequency or urgency.    ED Course: In the emergency department he's afebrile hemodynamically stable and not hypoxic. She is provided with a pressure, aspirin and to x-ray soaks. Provided with packing  Review of Systems: As per HPI otherwise 10 point review of systems negative.   Ambulatory Status: He ambulates with a cane on occasion no recent falls  Past Medical History:  Diagnosis Date  . Abdominal mass 2008   chronic inflammatory mass of the mesentary Dr [name]  . CAD (coronary artery disease)    cath 09/23/09 70% Dx1 (diffuse disease), 40% Cx, Large RCA 80-90% with heavy calcification / cath 06/2010 no change tiht RCA still best to treat medially with nosebleeds  . Chest pain    myoview 2006 no ischemia/ myoview 2009 no ischemia  . CKD (chronic kidney  disease)   . Dyslipidemia   . Hypertension    no RAS  . Hypothyroidism   . Kidney mass 2008   radiofrequency ablation at [facility]  . Left ventricular dysfunction    myoview 2006 normal / myoview 2009 normal / EF 60% cath 09/2009, EF 60% cath 06/2010  . Memory impairment    memory decreasing 06/2010  . Nosebleed    significant, can not take ASA  . Past heart attack    X2  . Rash Sept 2011   rash on buttocks, hospitalized hydralazine stopped but then restarted w/o return of rash  . Systolic click    no mitral valve prolapse    Past Surgical History:  Procedure Laterality Date  . BACK SURGERY    . BONE MARROW ASPIRATION     X2  . BONE MARROW BIOPSY     X2  . COLONOSCOPY N/A 03/07/2013   Procedure: COLONOSCOPY;  Surgeon: [name], MD;  Location: [facility];  Service: Endoscopy;  Laterality: N/A;  830-moved to 61 Ann to notify pt  . COLONOSCOPY  01/2013  . lymph node removal     right abdomen  . LYMPH NODE removal     robotic laser @ [facility]  on left side of stomach  . RFA RIGHT KIDNEY      Social History   Social History  . Marital status: Married    Spouse name: N/A  . Number of children: N/A  . Years of education: N/A   Occupational History  . Retired Retired   Social History Main Topics  . Smoking status: Former Smoker    Packs/day:  0.50    Years: 42.00    Types: Cigarettes    Quit date: 10/11/1980  . Smokeless tobacco: Never Used  . Alcohol use No  . Drug use: Unknown  . Sexual activity: Not on file   Other Topics Concern  . Not on file   Social History Narrative  . No narrative on file    Allergies  Allergen Reactions  . Ranolazine Shortness Of Breath  . Simvastatin Other (See Comments)    fatigue  . Aspirin Other (See Comments)    Causes severe bleeding  . Contrast Media [Iodinated Diagnostic Agents] Other (See Comments)    Will shut kidneys down  . Ibuprofen Other (See Comments)    Nose bleeds  . Ioxaglate Other (See Comments)     Will shut kidneys down    Family History  Problem Relation Age of Onset  . Cancer Maternal Grandfather   . Coronary artery disease      Prior to Admission medications   Medication Sig Start Date End Date Taking? Authorizing Provider  acetaminophen (TYLENOL) 500 MG tablet Take 500 mg by mouth every 6 (six) hours as needed for moderate pain.   Yes Historical Provider, MD  albuterol-ipratropium (COMBIVENT) 18-103 MCG/ACT inhaler Inhale 2-4 puffs into the lungs 2 (two) times daily as needed for wheezing.    Yes Historical Provider, MD  allopurinol (ZYLOPRIM) 100 MG tablet Take 1 tablet (100 mg total) by mouth 2 (two) times daily. 09/13/16  Yes Annita Brod, MD  ALPRAZolam Duanne Moron) 0.5 MG tablet Take 0.5 mg by mouth at bedtime as needed for anxiety.   Yes Historical Provider, MD  bisacodyl (DULCOLAX) 10 MG suppository Place 1 suppository (10 mg total) rectally daily as needed for moderate constipation. 09/13/16  Yes Annita Brod, MD  carvedilol (COREG) 6.25 MG tablet Take 6.25 mg by mouth 2 (two) times daily with a meal.     Yes Historical Provider, MD  Cholecalciferol (VITAMIN D3) 5000 UNITS CAPS Take 5,000 Units by mouth daily.    Yes Historical Provider, MD  fluticasone (FLONASE) 50 MCG/ACT nasal spray Place 2 sprays into both nostrils at bedtime as needed for allergies.  01/02/16  Yes Historical Provider, MD  furosemide (LASIX) 40 MG tablet Take 20 mg by mouth daily.    Yes Historical Provider, MD  hydroxyurea (HYDREA) 500 MG capsule Take 2 capsules (1,000 mg total) by mouth 2 (two) times daily. May take with food to minimize GI side effects. 09/13/16  Yes Annita Brod, MD  ipratropium (ATROVENT) 0.03 % nasal spray Place 2 sprays into both nostrils 3 (three) times daily.  01/02/16  Yes Historical Provider, MD  levothyroxine (SYNTHROID, LEVOTHROID) 125 MCG tablet Take 125 mcg by mouth daily.    Yes Historical Provider, MD  montelukast (SINGULAIR) 10 MG tablet Take 10 mg by mouth at  bedtime.    Yes Historical Provider, MD  multivitamin-iron-minerals-folic acid (THERAPEUTIC-M) TABS tablet Take 1 tablet by mouth daily.   Yes Historical Provider, MD  nitroGLYCERIN (NITRODUR - DOSED IN MG/24 HR) 0.6 mg/hr patch Place 0.6 mg onto the skin daily.   Yes Historical Provider, MD  Omega-3 Fatty Acids (FISH OIL) 1000 MG CPDR Take 1,000 mg by mouth daily.   Yes Historical Provider, MD  pantoprazole (PROTONIX) 40 MG tablet Take 1 tablet (40 mg total) by mouth daily before breakfast. 03/23/16  Yes [name], MD  pravastatin (PRAVACHOL) 20 MG tablet Take 20 mg by mouth at bedtime. 12/28/12  Yes  Historical Provider, MD  tamsulosin (FLOMAX) 0.4 MG CAPS capsule Take 0.4 mg by mouth daily.   Yes Historical Provider, MD  tiotropium (SPIRIVA) 18 MCG inhalation capsule Place 18 mcg into inhaler and inhale daily.   Yes Historical Provider, MD  traMADol (ULTRAM) 50 MG tablet Take 1 tablet (50 mg total) by mouth every 6 (six) hours as needed for moderate pain. 09/13/16  Yes Annita Brod, MD  vitamin B-12 (CYANOCOBALAMIN) 1000 MCG tablet Take 1,000 mcg by mouth daily.     Yes Historical Provider, MD    Physical Exam: Vitals:   09/28/16 1200 09/28/16 1215 09/28/16 1230 09/28/16 1300  BP: (!) 116/46 132/68 (!) 109/43 143/68  Pulse: 65 69 72 87  Resp: 16 20 22  (!) 27  Temp:      TempSrc:      SpO2: 97% 97% 98% 99%     General:  Appears calm and comfortable, nose slow draining blood out of left malar Eyes:  PERRL, EOMI, normal lids, iris ENT:  grossly normal hearing, lips & tongue, mucous membranes of his mouth are moist and pink Neck:  no LAD, masses or thyromegaly Cardiovascular:  RRR, no m/r/g. Left ankle with trace lower extremity edema Respiratory:  CTA bilaterally, no w/r/r. Normal respiratory effort. Abdomen:  soft, ntnd, positive bowel sounds throughout Skin:  no rash or induration seen on limited exam Musculoskeletal:  grossly normal tone BUE/BLE, good ROM, no bony  abnormality Psychiatric:  grossly normal mood and affect, speech fluent and appropriate, AOx3 Neurologic:  CN 2-12 grossly intact, moves all extremities in coordinated fashion, sensation intact [place] clear facial symmetry  Labs on Admission: I have personally reviewed following labs and imaging studies  CBC:  Recent Labs Lab 09/28/16 1108 09/28/16 1215  WBC 0.8*  --   HGB 7.0* 7.1*  HCT 20.9* 21.0*  MCV 95.4  --   PLT 40*  --    Basic Metabolic Panel:  Recent Labs Lab 09/28/16 1215  NA 141  K 3.6  CL 103  GLUCOSE 134*  BUN 38*  CREATININE 3.40*   GFR: CrCl cannot be calculated (Unknown ideal weight.). Liver Function Tests: No results for input(s): AST, ALT, ALKPHOS, BILITOT, PROT, ALBUMIN in the last 168 hours. No results for input(s): LIPASE, AMYLASE in the last 168 hours. No results for input(s): AMMONIA in the last 168 hours. Coagulation Profile:  Recent Labs Lab 09/28/16 1108  INR 1.05   Cardiac Enzymes: No results for input(s): CKTOTAL, CKMB, CKMBINDEX, TROPONINI in the last 168 hours. BNP (last 3 results) No results for input(s): PROBNP in the last 8760 hours. HbA1C: No results for input(s): HGBA1C in the last 72 hours. CBG: No results for input(s): GLUCAP in the last 168 hours. Lipid Profile: No results for input(s): CHOL, HDL, LDLCALC, TRIG, CHOLHDL, LDLDIRECT in the last 72 hours. Thyroid Function Tests: No results for input(s): TSH, T4TOTAL, FREET4, T3FREE, THYROIDAB in the last 72 hours. Anemia Panel: No results for input(s): VITAMINB12, FOLATE, FERRITIN, TIBC, IRON, RETICCTPCT in the last 72 hours. Urine analysis:    Component Value Date/Time   COLORURINE YELLOW 09/03/2016 2030   APPEARANCEUR CLOUDY (A) 09/03/2016 2030   LABSPEC 1.020 09/03/2016 2030   PHURINE 5.0 09/03/2016 2030   GLUCOSEU NEGATIVE 09/03/2016 2030   HGBUR SMALL (A) 09/03/2016 2030   BILIRUBINUR NEGATIVE 09/03/2016 2030   [place] 09/03/2016 2030   PROTEINUR 30  (A) 09/03/2016 2030   UROBILINOGEN 0.2 12/04/2014 2042   NITRITE NEGATIVE 09/03/2016 2030  LEUKOCYTESUR NEGATIVE 09/03/2016 2030    Creatinine Clearance: CrCl cannot be calculated (Unknown ideal weight.).  Sepsis Labs: @LABRCNTIP (procalcitonin:4,lacticidven:4) )No results found for this or any previous visit (from the past 240 hour(s)).   Radiological Exams on Admission: No results found.  EKG:   Assessment/Plan Principal Problem:   Acute blood loss anemia Active Problems:   Hypothyroidism, acquired   Essential hypertension   NOSEBLEED   MGUS (monoclonal gammopathy of unknown significance)   Acute on chronic kidney failure (HCC)   Thrombocytopenia (HCC)   Chronic combined systolic and diastolic CHF (congestive heart failure) (HCC)   Paroxysmal atrial fibrillation (HCC)   Leukocytosis   Acute myeloid leukemia in adult [facility])   CAD (coronary artery disease)   #1. Acute blood loss anemia from persistent nosebleeds in the setting of thrombocytopenia and recently diagnosed leukemia. Hemoglobin 7.1 upon presentation. Down from 10:15 days ago -Admit -Serial CBCs -Transfuse 2 units of packed red blood cells  #2. Persistent nosebleed. History of same. Continues to telemetry even after rest or aspirin. Packing present in per ED M.D. -Dr. Redmond Baseman ENT to see patient after office hours -Await ENT recommendations -Continue packing  #3. Acute on chronic renal failure stage III.Marland Kitchen Creatinine 3.4 on admission. Baseline appears to be 2-3. -Hold nephrotoxins -Fluids judiciously -Monitor urine output -Recheck in the morning  #4. Chronic combined systolic diastolic heart failure. Appears compensated. Echocardiogram from November this year reveals an EF of 15% grade 2 diastolic dysfunction. Home medications include Coreg, Lasix -Obtain daily weights -Monitor intake and output -Give IV Lasix between units of blood -Continue beta blocker  #5. Paroxysmal afib new onset last  hosptialization. chads score 5. Patient with history of severe nosebleed and new diagnosis of leukemia anticoagulation deferred. -Continue home meds -Monitor  #6.myelogenous leukemia recently diagnosed. White count 0.8 down from 75 2 weeks ago. Chart review indicates peripheral smear noted BiPAP neurology primary leukocytosis with atypical monocytes. During his last hospitalization hematology consulted patient underwent a bone marrow biopsy and unfortunately pathology came back which oncology confirmed is positive for AML. Note indicates patient not a good candidate for aggressive treatment and note indicates best options are limited with focus on controlling leukocyte proliferation as best as possible. Note indicates patient is very poor prognosis according to oncology has about 4 weeks to live. Care was consulted at the time and patient continued to be a full code -Continue hydroxyurea  #7. Hypertension. Somewhat labile in emergency department. Home medications include Lasix, Coreg, -Continue home meds -Monitor  #8. CAD. No chest pain. Cycle troponin negative. EKG obtained -Continue home meds    DVT prophylaxis: scd  Code Status: full  Family Communication: son and wife at bedside  Disposition Plan: home  Consults called: ent  Admission status: obs    Dyanne Carrel M MD [facility]  If 7PM-7AM, please contact night-coverage [email or web address] Password [email or web address]  09/28/2016, 2:55 PM

## 2016-09-28 NOTE — ED Provider Notes (Addendum)
[place] DEPT Provider Note   CSN: [id number] Arrival date & time: 09/28/16  8280     History   Chief Complaint Chief Complaint  Patient presents with  . Epistaxis    HPI Brian Mcmillan is a 82 y.o. male.  The history is provided by the patient.  Epistaxis   This is a recurrent problem. The current episode started 6 to 12 hours ago. The problem occurs constantly. The problem has not changed since onset.The problem is associated with an unknown factor. The bleeding has been from the left nare. He has tried applying pressure for the symptoms. The treatment provided no relief. His past medical history is significant for frequent nosebleeds. His past medical history does not include bleeding disorder, sinus problems or allergies.    Past Medical History:  Diagnosis Date  . Abdominal mass 2008   chronic inflammatory mass of the mesentary Dr [name]  . 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 Anemia   . Anginal pain ([place])   . CAD (coronary artery disease)    cath 09/23/09 70% Dx1 (diffuse disease), 40% Cx, Large RCA 80-90% with heavy calcification / cath 06/2010 no change tiht RCA still best to treat medially with nosebleeds  . Chest pain    myoview 2006 no ischemia/ myoview 2009 no ischemia  . CHF (congestive heart failure) ([place])   . CKD (chronic kidney disease)   . COPD (chronic obstructive pulmonary disease) ([place])   . Dyslipidemia   . GERD (gastroesophageal reflux disease)   . Gout   . History of bleeding ulcers    "had 13 back in the early 1980s" (09/28/2016)  . History of blood transfusion 09/28/2016  . Hypertension    no RAS  . Hypothyroidism   . Kidney mass 2008   radiofrequency ablation at [facility]  . Left ventricular dysfunction    myoview 2006 normal / myoview 2009 normal / EF 60% cath 09/2009, EF 60% cath 06/2010  . Memory impairment    memory decreasing 06/2010  . Myocardial infarction <2016X 1; 2016   "@ [facility]; @ [facility]"  . Nosebleed  "frequently since ~ 1966"   significant, can not take ASA  . Pneumonia 08/2016-09/2016  . Rash Sept 2011   rash on buttocks, hospitalized hydralazine stopped but then restarted w/o return of rash  . Renal cancer ([place])   . Skin cancer of scalp   . Systolic click    no mitral valve prolapse  . Type II diabetes mellitus [facility])     Patient Active Problem List   Diagnosis Date Noted  . Acute blood loss anemia 09/28/2016  . CAD (coronary artery disease)   . Pancytopenia ([place])   . Epistaxis, recurrent   . Hyperuricemia   . 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 AKI (acute kidney injury) ([place])   . Troponin level elevated   . Leukocytosis   . Lobar pneumonia, unspecified organism ([place])   . Acute on chronic kidney failure ([place]) 09/04/2016  . Acute on chronic systolic CHF (congestive heart failure) ([place]) 09/04/2016  . Thrombocytopenia ([place]) 09/04/2016  . Cellulitis 09/04/2016  . Chronic combined systolic and diastolic CHF (congestive heart failure) ([place]) 09/04/2016  . Paroxysmal atrial fibrillation (H[facility])   . Cricopharyngeal dysphagia 11/06/2015  . Esophageal dysphagia 11/06/2015  . Carotid disease, bilateral ([place]) 12/05/2014  . TIA (transient ischemic attack) 12/04/2014  . MGUS (monoclonal gammopathy of unknown significance) 03/29/2011  . Renal cancer ([place]) 03/29/2011    Class: Diagnosis of  .  Shortness of breath 12/24/2010  . EDEMA 11/17/2010  . HEADACHE 10/28/2009  . Coronary artery disease due to lipid rich plaque 09/25/2009  . NOSEBLEED 09/22/2009  . Hypothyroidism, acquired 06/29/2009  . DYSLIPIDEMIA 06/29/2009  . Essential hypertension 06/29/2009  . CHEST PAIN 06/29/2009    Past Surgical History:  Procedure Laterality Date  . BACK SURGERY    . BONE MARROW ASPIRATION     X2  . BONE MARROW BIOPSY     X2  . CARDIAC CATHETERIZATION    . CATARACT EXTRACTION W/ INTRAOCULAR LENS  IMPLANT, BILATERAL Bilateral   . COLONOSCOPY N/A 03/07/2013   Procedure: COLONOSCOPY;   Surgeon: [name], MD;  Location: [facility];  Service: Endoscopy;  Laterality: N/A;  830-moved to 17 Ann to notify pt  . COLONOSCOPY  01/2013  . LUMBAR DISC SURGERY     "L4-5"  . LYMPH NODE DISSECTION Right     abdomen  . LYMPH NODE DISSECTION Left    robotic laser @ [facility]  on left side of stomach  . MOHS SURGERY     scalp; "22 stitches around; 9 stitches down; went all the way to the skull"  . NASAL SEPTUM SURGERY    . RFA RIGHT KIDNEY    . TONSILLECTOMY  1933       Home Medications    Prior to Admission medications   Medication Sig Start Date End Date Taking? Authorizing Provider  acetaminophen (TYLENOL) 500 MG tablet Take 500 mg by mouth every 6 (six) hours as needed for moderate pain.   Yes Historical Provider, MD  albuterol-ipratropium (COMBIVENT) 18-103 MCG/ACT inhaler Inhale 2-4 puffs into the lungs 2 (two) times daily as needed for wheezing.    Yes Historical Provider, MD  allopurinol (ZYLOPRIM) 100 MG tablet Take 1 tablet (100 mg total) by mouth 2 (two) times daily. 09/13/16  Yes Annita Brod, MD  ALPRAZolam Duanne Moron) 0.5 MG tablet Take 0.5 mg by mouth at bedtime as needed for anxiety.   Yes Historical Provider, MD  bisacodyl (DULCOLAX) 10 MG suppository Place 1 suppository (10 mg total) rectally daily as needed for moderate constipation. 09/13/16  Yes Annita Brod, MD  carvedilol (COREG) 6.25 MG tablet Take 6.25 mg by mouth 2 (two) times daily with a meal.     Yes Historical Provider, MD  Cholecalciferol (VITAMIN D3) 5000 UNITS CAPS Take 5,000 Units by mouth daily.    Yes Historical Provider, MD  fluticasone (FLONASE) 50 MCG/ACT nasal spray Place 2 sprays into both nostrils at bedtime as needed for allergies.  01/02/16  Yes Historical Provider, MD  furosemide (LASIX) 40 MG tablet Take 20 mg by mouth daily.    Yes Historical Provider, MD  hydroxyurea (HYDREA) 500 MG capsule Take 2 capsules (1,000 mg total) by mouth 2 (two) times daily. May take with food to  minimize GI side effects. 09/13/16  Yes Annita Brod, MD  ipratropium (ATROVENT) 0.03 % nasal spray Place 2 sprays into both nostrils 3 (three) times daily.  01/02/16  Yes Historical Provider, MD  levothyroxine (SYNTHROID, LEVOTHROID) 125 MCG tablet Take 125 mcg by mouth daily.    Yes Historical Provider, MD  montelukast (SINGULAIR) 10 MG tablet Take 10 mg by mouth at bedtime.    Yes Historical Provider, MD  multivitamin-iron-minerals-folic acid (THERAPEUTIC-M) TABS tablet Take 1 tablet by mouth daily.   Yes Historical Provider, MD  nitroGLYCERIN (NITRODUR - DOSED IN MG/24 HR) 0.6 mg/hr patch Place 0.6 mg onto the skin daily.  Yes Historical Provider, MD  Omega-3 Fatty Acids (FISH OIL) 1000 MG CPDR Take 1,000 mg by mouth daily.   Yes Historical Provider, MD  pantoprazole (PROTONIX) 40 MG tablet Take 1 tablet (40 mg total) by mouth daily before breakfast. 03/23/16  Yes [name], MD  pravastatin (PRAVACHOL) 20 MG tablet Take 20 mg by mouth at bedtime. 12/28/12  Yes Historical Provider, MD  tamsulosin (FLOMAX) 0.4 MG CAPS capsule Take 0.4 mg by mouth daily.   Yes Historical Provider, MD  tiotropium (SPIRIVA) 18 MCG inhalation capsule Place 18 mcg into inhaler and inhale daily.   Yes Historical Provider, MD  traMADol (ULTRAM) 50 MG tablet Take 1 tablet (50 mg total) by mouth every 6 (six) hours as needed for moderate pain. 09/13/16  Yes Annita Brod, MD  vitamin B-12 (CYANOCOBALAMIN) 1000 MCG tablet Take 1,000 mcg by mouth daily.     Yes Historical Provider, MD    Family History Family History  Problem Relation Age of Onset  . Cancer Maternal Grandfather   . Coronary artery disease      Social History Social History  Substance Use Topics  . Smoking status: Former Smoker    Packs/day: 0.50    Years: 42.00    Types: Cigarettes    Quit date: 10/11/1980  . Smokeless tobacco: Never Used  . Alcohol use 0.0 oz/week     Comment: 09/28/2016 "don't drink at all anymore; nothing in years;  used to have a social drink"     Allergies   Ranolazine; Simvastatin; Aspirin; Contrast media [iodinated diagnostic agents]; Ibuprofen; and Ioxaglate   Review of Systems Review of Systems  HENT: Positive for nosebleeds.   Hematological: Does not bruise/bleed easily.  All other systems reviewed and are negative.    Physical Exam Updated Vital Signs BP (!) 170/59   Pulse 63   Temp 98.2 F (36.8 C) (Oral)   Resp 20   Ht _0  (1.854 m)   Wt 203 lb 14.8 oz (92.5 kg)   SpO2 99%   BMI 26.90 kg/m   Physical Exam  Constitutional: He is oriented to person, place, and time. He appears well-developed.  HENT:  Head: Atraumatic.  L sided nose bleed.  Neck: Neck supple.  Cardiovascular: Normal rate.   Pulmonary/Chest: Effort normal.  Neurological: He is alert and oriented to person, place, and time.  Skin: Skin is warm.  Nursing note and vitals reviewed.    ED Treatments / Results  Labs (all labs ordered are listed, but only abnormal results are displayed) Labs Reviewed  CBC - Abnormal; Notable for the following:       Result Value   WBC 0.8 (*)    RBC 2.19 (*)    Hemoglobin 7.0 (*)    HCT 20.9 (*)    RDW 19.9 (*)    Platelets 40 (*)    All other components within normal limits  CBC - Abnormal; Notable for the following:    WBC 1.5 (*)    RBC 2.30 (*)    Hemoglobin 7.2 (*)    HCT 22.0 (*)    RDW 20.1 (*)    Platelets 50 (*)    All other components within normal limits  I-STAT CHEM 8, ED - Abnormal; Notable for the following:    BUN 38 (*)    Creatinine, Ser 3.40 (*)    Glucose, Bld 134 (*)    Hemoglobin 7.1 (*)    HCT 21.0 (*)    All other  components within normal limits  PROTIME-INR  SAVE SMEAR  BASIC METABOLIC PANEL  CBC  I-STAT TROPOININ, ED  TYPE AND SCREEN  ABO/RH  PREPARE RBC (CROSSMATCH)    EKG  EKG Interpretation None       Radiology No results found.  Procedures .Critical Care Performed by: Varney Biles Authorized by:  Varney Biles   Critical care provider statement:    Critical care time (minutes):  90   Critical care time was exclusive of:  Separately billable procedures and treating other patients   Critical care was necessary to treat or prevent imminent or life-threatening deterioration of the following conditions: complicated epistaxis, acute kidney injury and acute anemia.   Critical care was time spent personally by me on the following activities:  Blood draw for specimens, development of treatment plan with patient or surrogate, discussions with consultants, examination of patient, obtaining history from patient or surrogate, ordering and performing treatments and interventions, ordering and review of laboratory studies, ordering and review of radiographic studies, pulse oximetry, re-evaluation of patient's condition and review of old charts .Epistaxis Management Date/Time: 09/28/2016 11:09 PM Performed by: Varney Biles Authorized by: Varney Biles   Consent:    Consent obtained:  Verbal   Consent given by:  Patient   Risks discussed:  Bleeding   Alternatives discussed:  No treatment, delayed treatment and alternative treatment Anesthesia (see MAR for exact dosages):    Anesthesia method:  None Procedure details:    Treatment site:  L anterior   Treatment method:  Anterior pack   Treatment complexity:  Extensive   Treatment episode: recurring   Post-procedure details:    Assessment:  Bleeding stopped   Patient tolerance of procedure:  Tolerated well, no immediate complications   (including critical care time)  Medications Ordered in ED Medications  allopurinol (ZYLOPRIM) tablet 100 mg (100 mg Oral Given 09/28/16 2158)  bisacodyl (DULCOLAX) suppository 10 mg (not administered)  tamsulosin (FLOMAX) capsule 0.4 mg (0.4 mg Oral Given 09/28/16 1701)  pantoprazole (PROTONIX) EC tablet 40 mg (not administered)  furosemide (LASIX) tablet 20 mg (not administered)  tiotropium (SPIRIVA)  inhalation capsule 18 mcg (18 mcg Inhalation Not Given 09/28/16 1418)  pravastatin (PRAVACHOL) tablet 20 mg (20 mg Oral Given 09/28/16 2158)  carvedilol (COREG) tablet 6.25 mg (6.25 mg Oral Given 09/28/16 1701)  levothyroxine (SYNTHROID, LEVOTHROID) tablet 125 mcg (not administered)  montelukast (SINGULAIR) tablet 10 mg (10 mg Oral Given 09/28/16 2158)  sodium chloride flush (NS) 0.9 % injection 3 mL (3 mLs Intravenous Given 09/28/16 2159)  acetaminophen (TYLENOL) tablet 650 mg (650 mg Oral Given 09/28/16 2028)    Or  acetaminophen (TYLENOL) suppository 650 mg ( Rectal See Alternative 09/28/16 2028)  ondansetron (ZOFRAN) tablet 4 mg (not administered)    Or  ondansetron (ZOFRAN) injection 4 mg (not administered)  amoxicillin-clavulanate (AUGMENTIN) 875-125 MG per tablet 1 tablet (1 tablet Oral Given 09/28/16 2158)  ipratropium-albuterol (DUONEB) 0.5-2.5 (3) MG/3ML nebulizer solution 3 mL (not administered)  feeding supplement (BOOST / RESOURCE BREEZE) liquid 1 Container (1 Container Oral Given 09/28/16 2203)  feeding supplement (ENSURE ENLIVE) (ENSURE ENLIVE) liquid 237 mL (not administered)  oxymetazoline (AFRIN) 0.05 % nasal spray 1 spray (1 spray Each Nare Given by Other 09/28/16 0824)  tranexamic acid (CYKLOKAPRON) injection 500 mg (500 mg Topical Given by Other 09/28/16 0824)  lactated ringers bolus 1,000 mL (0 mLs Intravenous Stopped 09/28/16 1535)  0.9 %  sodium chloride infusion ( Intravenous New Bag/Given 09/28/16 2027)  Initial Impression / Assessment and Plan / ED Course  I have reviewed the triage vital signs and the nursing notes.  Pertinent labs & imaging results that were available during my care of the patient were reviewed by me and considered in my medical decision making (see chart for details).  Clinical Course     Pt comes in with epistaxis. PT was recently diagnosed with leukemia and he has recurrent nose bleed. Pt required packing in his nares last time he  was admitted to the hospital. Pt really wants [place] to avoid packing if possible.  We tried direct pressure, followed by afrin, followed by TXA soaks. Although pt is not having large amount of bleeding like was earlier - he is still dripping. I dont see role of cautery. I suspect that pt might have posterior bleed - but family would prefer ENT consultation before we decide on packing.  I spoke with Dr. Redmond Baseman. Dr. Redmond Baseman will open a spot at 1:00 pm in his clinic. For now we will watch patient in the ER. Basic labs have been ordered. If there is severe anemia, we will keep pt here, I will pack and we will consult ENT.  LATE ENTRY: After patient's lab results returned, he opted to get the packing done. Dr. Redmond Baseman was going to see the patient after pt was admitted.   Final Clinical Impressions(s) / ED Diagnoses   Final diagnoses:  Epistaxis, recurrent  Acute blood loss anemia  AKI (acute kidney injury) [facility]    New Prescriptions Current Discharge Medication List       Varney Biles, MD 09/28/16 [place], MD 09/28/16 2311

## 2016-09-28 NOTE — Care Management Note (Addendum)
Case Management Note  Patient Details  Name: Brian Mcmillan MRN: [id number] Date of Birth: 01/12/1925  Subjective/Objective:                  82 yo male pt comes in with epistaxis. Pt was recently diagnosed with leukemia and he has recurrent nose bleed. /From home with spouse.  Action/Plan: Follow for disposition needs. /Admit for OBSERVATION   Expected Discharge Date:  09/30/16               Expected Discharge Plan:  [place]  In-House Referral:  NA  Discharge planning Services  CM Consult  Post Acute Care Choice:  Resumption of Svcs/PTA Provider  HH Arranged:  RN, PT, Nurse's Aide, Social Work [employer] Agency:   [facility]  Status of Service:  In process, will continue to follow  If discussed at Long Length of Stay Meetings, dates discussed:    Additional Comments: Pt currently active with [facility] for RN/NA/PT/SW services.  Resumption of care requested. Sharmon Revere of [facility] notified.  No DME needs identified at this time.  Fuller Mandril, RN 09/28/2016, 2:18 PM

## 2016-09-28 NOTE — Consult Note (Signed)
Reason for Consult:Nosebleeds Referring Physician: ER  SRINIVAS LIPPMAN is an 82 y.o. male.  HPI: 82 year old with long history of recurring left-sided nosebleeds that have required previous cautery and packing over the years.  His last episode was two weeks ago and required packing.  He was diagnosed two weeks ago with leukemia and has a poor prognosis.  Bleeding resumed from the left side and would not stop.  He returned to the ER and a pack was placed with good control.  He is being admitted due to anemia.  Past Medical History:  Diagnosis Date  . Abdominal mass 2008   chronic inflammatory mass of the mesentary Dr [name]  . CAD (coronary artery disease)    cath 09/23/09 70% Dx1 (diffuse disease), 40% Cx, Large RCA 80-90% with heavy calcification / cath 06/2010 no change tiht RCA still best to treat medially with nosebleeds  . Chest pain    myoview 2006 no ischemia/ myoview 2009 no ischemia  . CKD (chronic kidney disease)   . Dyslipidemia   . Hypertension    no RAS  . Hypothyroidism   . Kidney mass 2008   radiofrequency ablation at [facility]  . Left ventricular dysfunction    myoview 2006 normal / myoview 2009 normal / EF 60% cath 09/2009, EF 60% cath 06/2010  . Memory impairment    memory decreasing 06/2010  . Nosebleed    significant, can not take ASA  . Past heart attack    X2  . Rash Sept 2011   rash on buttocks, hospitalized hydralazine stopped but then restarted w/o return of rash  . Systolic click    no mitral valve prolapse    Past Surgical History:  Procedure Laterality Date  . BACK SURGERY    . BONE MARROW ASPIRATION     X2  . BONE MARROW BIOPSY     X2  . COLONOSCOPY N/A 03/07/2013   Procedure: COLONOSCOPY;  Surgeon: [name], MD;  Location[facility];  Service: Endoscopy;  Laterality: N/A;  830-moved to 58 Ann to notify pt  . COLONOSCOPY  01/2013  . lymph node removal     right abdomen  . LYMPH NODE removal     robotic laser @ [facility]  on left  side of stomach  . RFA RIGHT KIDNEY      Family History  Problem Relation Age of Onset  . Cancer Maternal Grandfather   . Coronary artery disease      Social History:  reports that he quit smoking about 35 years ago. His smoking use included Cigarettes. He has a 21.00 pack-year smoking history. He has never used smokeless tobacco. He reports that he does not drink alcohol. His drug history is not on file.  Allergies:  Allergies  Allergen Reactions  . Ranolazine Shortness Of Breath  . Simvastatin Other (See Comments)    fatigue  . Aspirin Other (See Comments)    Causes severe bleeding  . Contrast Media [Iodinated Diagnostic Agents] Other (See Comments)    Will shut kidneys down  . Ibuprofen Other (See Comments)    Nose bleeds  . Ioxaglate Other (See Comments)    Will shut kidneys down    Medications: I have reviewed the patient's current medications.  Results for orders placed or performed during the hospital encounter of 09/28/16 (from the past 48 hour(s))  CBC     Status: Abnormal   Collection Time: 09/28/16 11:08 AM  Result Value Ref Range  WBC 0.8 (LL) 4.0 - 10.5 K/uL    Comment: REPEATED TO VERIFY CRITICAL RESULT CALLED TO, READ BACK BY AND VERIFIED WITH: HICKS,W RN @ 1206 09/28/16 LEONARD,A    RBC 2.19 (L) 4.22 - 5.81 MIL/uL   Hemoglobin 7.0 (L) 13.0 - 17.0 g/dL   HCT 20.9 (L) 39.0 - 52.0 %   MCV 95.4 78.0 - 100.0 fL   MCH 32.0 26.0 - 34.0 pg   MCHC 33.5 30.0 - 36.0 g/dL   RDW 19.9 (H) 11.5 - 15.5 %   Platelets 40 (L) 150 - 400 K/uL    Comment: PLATELET COUNT CONFIRMED BY SMEAR  Protime-INR     Status: None   Collection Time: 09/28/16 11:08 AM  Result Value Ref Range   Prothrombin Time 13.8 11.4 - 15.2 seconds   INR 1.05   I-stat troponin, ED     Status: None   Collection Time: 09/28/16 12:13 PM  Result Value Ref Range   Troponin i, poc 0.01 0.00 - 0.08 ng/mL   Comment 3            Comment: Due to the release kinetics of cTnI, a negative result within  the first hours of the onset of symptoms does not rule out myocardial infarction with certainty. If myocardial infarction is still suspected, repeat the test at appropriate intervals.   I-stat chem 8, ed     Status: Abnormal   Collection Time: 09/28/16 12:15 PM  Result Value Ref Range   Sodium 141 135 - 145 mmol/L   Potassium 3.6 3.5 - 5.1 mmol/L   Chloride 103 101 - 111 mmol/L   BUN 38 (H) 6 - 20 mg/dL   Creatinine, Ser 3.40 (H) 0.61 - 1.24 mg/dL   Glucose, Bld 134 (H) 65 - 99 mg/dL   Calcium, Ion 1.26 1.15 - 1.40 mmol/L   TCO2 28 0 - 100 mmol/L   Hemoglobin 7.1 (L) 13.0 - 17.0 g/dL   HCT 21.0 (L) 39.0 - 52.0 %  Type and screen     Status: None   Collection Time: 09/28/16  1:10 PM  Result Value Ref Range   ABO/RH(D) AB POS    Antibody Screen NEG    Sample Expiration 10/01/2016   ABO/Rh     Status: None   Collection Time: 09/28/16  1:10 PM  Result Value Ref Range   ABO/RH(D) AB POS   CBC     Status: Abnormal   Collection Time: 09/28/16  3:18 PM  Result Value Ref Range   WBC 1.5 (L) 4.0 - 10.5 K/uL    Comment: REPEATED TO VERIFY WHITE COUNT CONFIRMED ON SMEAR    RBC 2.30 (L) 4.22 - 5.81 MIL/uL   Hemoglobin 7.2 (L) 13.0 - 17.0 g/dL   HCT 22.0 (L) 39.0 - 52.0 %   MCV 95.7 78.0 - 100.0 fL   MCH 31.3 26.0 - 34.0 pg   MCHC 32.7 30.0 - 36.0 g/dL   RDW 20.1 (H) 11.5 - 15.5 %   Platelets 50 (L) 150 - 400 K/uL    Comment: CONSISTENT WITH PREVIOUS RESULT REPEATED TO VERIFY   Prepare RBC     Status: None   Collection Time: 09/28/16  5:00 PM  Result Value Ref Range   Order Confirmation ORDER PROCESSED BY BLOOD BANK     No results found.  Review of Systems  HENT: Positive for nosebleeds.   Neurological: Positive for weakness and headaches.  All other systems reviewed and are negative.  Blood  pressure 179/72, pulse 68, temperature 98.3 F (36.8 C), temperature source Oral, resp. rate 13, SpO2 95 %. Physical Exam  Constitutional: He is oriented to person, place, and  time. He appears well-developed and well-nourished. No distress.  HENT:  Head: Normocephalic and atraumatic.  Right Ear: External ear normal.  Left Ear: External ear normal.  Mouth/Throat: Oropharynx is clear and moist.  Left nasal pack in place with no active bleeding.  No blood in oropharynx.  Tonsils absent.  Eyes: Conjunctivae and EOM are normal. Pupils are equal, round, and reactive to light.  Neck: Normal range of motion. Neck supple.  Cardiovascular: Normal rate.   Respiratory: Effort normal.  Neurological: He is alert and oriented to person, place, and time. No cranial nerve deficit.  Skin: Skin is warm and dry.  Psychiatric: He has a normal mood and affect. His behavior is normal. Judgment and thought content normal.    Assessment/Plan: Left epistaxis and acute leukemia Bleeding is currently controlled with a pack in place.  We discussed options at length including leaving this pack in place for the next few days, removing this pack and trying to find a source for cautery, and operative management.  He is not a good surgical candidate currently and removing the pack may cause the need to replace a pack.  He definitely does not want a pack replaced so is willing to leave this current pack in place.  I will plan to remove the pack on Friday, whether inpatient or outpatient.  Pain can be controlled with appropriate medicine.  I recommend covering sinonasal infection potential with cephalexin while the pack is in place.  Will follow along.  Gilberto Stanforth 09/28/2016, 5:36 PM

## 2016-09-28 NOTE — ED Notes (Signed)
Dr. Kathrynn Humble notified of WBC result of 0.8.

## 2016-09-28 NOTE — Progress Notes (Signed)
Patient trasfered from ED to 5W24 via stretcher; alert and oriented x 4; no complaints of pain; IV saline locked in RFA. Orient patient to room and unit; gave patient care guide; instructed how to use the call bell and  fall risk precautions. Will continue to monitor the patient.

## 2016-09-28 NOTE — ED Triage Notes (Signed)
Per EMS, pt got a nose bleed that started about 0300. Pt has hx of nose bleeds. Pt was seen last week because of this problem.

## 2016-09-29 ENCOUNTER — Encounter (HOSPITAL_COMMUNITY): Payer: Self-pay | Admitting: *Deleted

## 2016-09-29 DIAGNOSIS — Z515 Encounter for palliative care: Secondary | ICD-10-CM | POA: Diagnosis not present

## 2016-09-29 DIAGNOSIS — D62 Acute posthemorrhagic anemia: Secondary | ICD-10-CM

## 2016-09-29 DIAGNOSIS — C9200 Acute myeloblastic leukemia, not having achieved remission: Secondary | ICD-10-CM

## 2016-09-29 DIAGNOSIS — D6959 Other secondary thrombocytopenia: Secondary | ICD-10-CM | POA: Diagnosis not present

## 2016-09-29 DIAGNOSIS — D61818 Other pancytopenia: Secondary | ICD-10-CM | POA: Diagnosis not present

## 2016-09-29 DIAGNOSIS — C9210 Chronic myeloid leukemia, BCR/ABL-positive, not having achieved remission: Secondary | ICD-10-CM | POA: Diagnosis not present

## 2016-09-29 DIAGNOSIS — Z7189 Other specified counseling: Secondary | ICD-10-CM | POA: Diagnosis not present

## 2016-09-29 DIAGNOSIS — I110 Hypertensive heart disease with heart failure: Secondary | ICD-10-CM | POA: Diagnosis not present

## 2016-09-29 DIAGNOSIS — I509 Heart failure, unspecified: Secondary | ICD-10-CM | POA: Diagnosis not present

## 2016-09-29 DIAGNOSIS — D696 Thrombocytopenia, unspecified: Secondary | ICD-10-CM

## 2016-09-29 DIAGNOSIS — D630 Anemia in neoplastic disease: Secondary | ICD-10-CM | POA: Diagnosis not present

## 2016-09-29 DIAGNOSIS — R040 Epistaxis: Secondary | ICD-10-CM | POA: Diagnosis not present

## 2016-09-29 LAB — BASIC METABOLIC PANEL
ANION GAP: 9 (ref 5–15)
BUN: 30 mg/dL — ABNORMAL HIGH (ref 6–20)
CALCIUM: 9.2 mg/dL (ref 8.9–10.3)
CHLORIDE: 103 mmol/L (ref 101–111)
CO2: 25 mmol/L (ref 22–32)
Creatinine, Ser: 2.28 mg/dL — ABNORMAL HIGH (ref 0.61–1.24)
GFR calc non Af Amer: 24 mL/min — ABNORMAL LOW (ref >60)
GFR, EST AFRICAN AMERICAN: 28 mL/min — AB (ref >60)
Glucose, Bld: 131 mg/dL — ABNORMAL HIGH (ref 65–99)
Potassium: 3.3 mmol/L — ABNORMAL LOW (ref 3.5–5.1)
Sodium: 137 mmol/L (ref 135–145)

## 2016-09-29 LAB — TYPE AND SCREEN
ABO/RH(D): AB POS
ANTIBODY SCREEN: NEGATIVE
UNIT DIVISION: 0
Unit division: 0

## 2016-09-29 LAB — CBC
HCT: 27.6 % — ABNORMAL LOW (ref 39.0–52.0)
HEMOGLOBIN: 9.4 g/dL — AB (ref 13.0–17.0)
MCH: 30.8 pg (ref 26.0–34.0)
MCHC: 34.1 g/dL (ref 30.0–36.0)
MCV: 90.5 fL (ref 78.0–100.0)
Platelets: 34 10*3/uL — ABNORMAL LOW (ref 150–400)
RBC: 3.05 MIL/uL — AB (ref 4.22–5.81)
RDW: 19.4 % — ABNORMAL HIGH (ref 11.5–15.5)
WBC: 0.9 10*3/uL — AB (ref 4.0–10.5)

## 2016-09-29 MED ORDER — TRAMADOL HCL 50 MG PO TABS
50.0000 mg | ORAL_TABLET | Freq: Four times a day (QID) | ORAL | Status: DC | PRN
Start: 2016-09-29 — End: 2016-09-29
  Administered 2016-09-29 (×2): 50 mg via ORAL
  Filled 2016-09-29 (×2): qty 1

## 2016-09-29 MED ORDER — AMOXICILLIN-POT CLAVULANATE 500-125 MG PO TABS
500.0000 mg | ORAL_TABLET | Freq: Two times a day (BID) | ORAL | Status: DC
  Filled 2016-09-29: qty 1

## 2016-09-29 MED ORDER — HYDRALAZINE HCL 20 MG/ML IJ SOLN
10.0000 mg | Freq: Once | INTRAMUSCULAR | Status: AC
  Administered 2016-09-29: 10 mg via INTRAVENOUS
  Filled 2016-09-29: qty 1

## 2016-09-29 MED ORDER — CEPHALEXIN 500 MG PO CAPS: 500.0000 mg | ORAL_CAPSULE | Freq: Four times a day (QID) | ORAL | 0 refills | Status: DC

## 2016-09-29 MED ORDER — LIDOCAINE HCL 2 % EX GEL
1.0000 "application " | Freq: Three times a day (TID) | CUTANEOUS | Status: DC | PRN
  Administered 2016-09-29: 1 via TOPICAL
  Filled 2016-09-29 (×2): qty 5

## 2016-09-29 NOTE — Progress Notes (Signed)
Patient discharge teaching given, including activity, diet, follow-up appoints, and medications. Patient verbalized understanding of all discharge instructions. IV access was d/c'd. Vitals are stable. Skin is intact except as charted in most recent assessments. Pt to be escorted out by NT, to be driven home by family. 

## 2016-09-29 NOTE — Consult Note (Signed)
Consultation Note Date: 09/29/2016   Patient Name: Brian Mcmillan  DOB: 1924/11/03  MRN: [id number]  Age / Sex: 82 y.o., male  PCP: Redmond School, MD Referring Physician: Geradine Girt, DO  Reason for Consultation: Establishing goals of care  HPI/Patient Profile:  82 y.o. male who lives at home with his wife in [place], [place]. Patient has a past medical history significant for ischemic heart disease, sCHF, history of skin cancer, hx renal cell cancer s/p ablation, history of MGUS, and AML, diagnosed in late November of this year. At time of AML diagnosis Oncology believed he only had a month left to live. Despite discussing Hospice with Palliative during prior admission, he elected to have home health follow after discharge. He know presents with uncontrollable epistaxis and was admitted on 09/28/2016 with profound pancytopenia. He had been on Hydrea to help control his prior blast crisis, however this was stopped on admission. ENT was consulted and plan is to leave a pack in place for a few days, and remove it on Friday, whether inpatient or outpatient.   Clinical Assessment and Goals of Care: I met with Brian Mcmillan, his wife, and one of his sons at his bedside. I introduced myself and reinforced the role palliative care played on his care team. He and his family had previously met with Dr. Rowe Pavy during his prior hospitalization. When we first started talking I asked Brian Mcmillan to explain to me what he understood about his present hospitalization, and how it related to his health issues overall. He was able to explain that he was recently diagnosed with a type of cancer in his blood, but didn't understand how that it related to his uncontrollable nose bleed. We discussed at length what AML was, and specifically how it affects blood counts. Along those lines, I explained how having a low WBC count put him at risk for infections, how low RBC could  make him tired and short of breath, and how low platelets put him at risk for uncontrollable bleeding. I also explained how these issues could not be cured, and our interventions (such as transfusions) were only putting band-aids on the issues.   We next talked about his goals. He was clear on wanting to be at home and spend time with his wife and family. Drawing back from our conversation about AML, I explained the reality that other issues were going to occur: other bleeding, infection, fatigue, etc., and these things could be managed at the hospital, but he would be spending more and more time here, when his time--regardless of our interventions--is very limited. He was able to verbalize that being at the hospital was not where he wanted to spend what time he has left. Consequently, I brought up the utilization of Hospice. I explained that they are a type of care that is utilized at the end of life, and is focused on symptom management and supporting people at home. Hospice does not treat infections with antibiotics, give blood or fluid transfusions, or do nasal packing, rather they focus on how he feels and treats the symptoms associated with these issues. He and his family were in agreement with transitioning to that comfort focused care model.   Finally, we talked about code status. After I explained that, in the event that he should die naturally with his heart stopping or his lungs stopping, we could do things to him to keep him alive, but these things would be painful and would not change the underlying  issue: his leukemia. After discussing it with his family, he felt that he would not want those things (CPR, defibrillation, intubation) and would want to be kept comfortable and die in peace. His family heard these wishes and agreed with his decision.   Primary Decision Maker PATIENT   SUMMARY OF RECOMMENDATIONS    DNR/DNI (order placed and yellow sheet in paper chart); plan for discharge ASAP  with Hospice; SW consulted to help facilitate this  Nasal packing in place, I scheduled a follow-up outpatient appointment with Dr. Redmond Baseman on Friday to remove packing (Friday 12/22 at 4:10pm at his office at [address])   Would continue antibiotic coverage for potential sinonasal infection, per recommendation of ENT. They advised Cephalexin  Code Status/Advance Care Planning:  DNR  Symptom Management:   Pt has no acute complaints, would continue home tramadol on discharge for pain management  Additional Recommendations (Limitations, Scope, Preferences):  Full Comfort Care  Psycho-social/Spiritual:   Desire for further Chaplaincy support:yes  Additional Recommendations: Education on Hospice  Prognosis:   Days to weeks. Pt is profoundly pancytopenic in the setting of known/untreated AML.   Discharge Planning: Home with Hospice      Primary Diagnoses: Present on Admission: . 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 Acute on chronic kidney failure ([place]) . CAD (coronary artery disease) . Essential hypertension . Hypothyroidism, acquired . Leukocytosis . MGUS (monoclonal gammopathy of unknown significance) . Paroxysmal atrial fibrillation (HCC) . Thrombocytopenia ([place]) . Acute blood loss anemia . NOSEBLEED . Chronic combined systolic and diastolic CHF (congestive heart failure) ([place])   I have reviewed the medical record, interviewed the patient and family, and examined the patient. The following aspects are pertinent.  Past Medical History:  Diagnosis Date  . Abdominal mass 2008   chronic inflammatory mass of the mesentary Dr [name]  . Acute myeloblastic leukemia ([place]) dx'd 09/2016  . Anemia   . Anginal pain ([place])   . CAD (coronary artery disease)    cath 09/23/09 70% Dx1 (diffuse disease), 40% Cx, Large RCA 80-90% with heavy calcification / cath 06/2010 no change tiht RCA still best to treat medially with nosebleeds  . Chest pain     myoview 2006 no ischemia/ myoview 2009 no ischemia  . CHF (congestive heart failure) ([place])   . CKD (chronic kidney disease)   . COPD (chronic obstructive pulmonary disease) ([place])   . Dyslipidemia   . GERD (gastroesophageal reflux disease)   . Gout   . History of bleeding ulcers    "had 13 back in the early 1980s" (09/28/2016)  . History of blood transfusion 09/28/2016  . Hypertension    no RAS  . Hypothyroidism   . Kidney mass 2008   [facility]frequency ablation at [facility]  . Left ventricular dysfunction    myoview 2006 normal / myoview 2009 normal / EF 60% cath 09/2009, EF 60% cath 06/2010  . Memory impairment    memory decreasing 06/2010  . Myocardial infarction <2016X 1; 2016   "@ [facility]; @ [facility]"  . Nosebleed "frequently since ~ 1966"   significant, can not take ASA  . Pneumonia 08/2016-09/2016  . Rash Sept 2011   rash on buttocks, hospitalized hydralazine stopped but then restarted w/o return of rash  . Renal cancer ([place])   . Skin cancer of scalp   . Systolic click    no mitral valve prolapse  . Type II diabetes mellitus ([place])    Social History   Social History  .  Marital status: Married    Spouse name: N/A  . Number of children: N/A  . Years of education: N/A   Occupational History  . Retired Retired   Social History Main Topics  . Smoking status: Former Smoker    Packs/day: 0.50    Years: 42.00    Types: Cigarettes    Quit date: 10/11/1980  . Smokeless tobacco: Never Used  . Alcohol use 0.0 oz/week     Comment: 09/28/2016 "don't drink at all anymore; nothing in years; used to have a social drink"  . Drug use: No  . Sexual activity: Not Asked   Other Topics Concern  . None   Social History Narrative  . None   Family History  Problem Relation Age of Onset  . Cancer Maternal Grandfather   . Coronary artery disease     Scheduled Meds: . allopurinol  100 mg Oral BID  . amoxicillin-clavulanate  1 tablet Oral Q12H  . carvedilol  6.25 mg Oral BID WC  .  feeding supplement  1 Container Oral TID BM  . feeding supplement (ENSURE ENLIVE)  237 mL Oral BID BM  . furosemide  20 mg Oral Daily  . levothyroxine  125 mcg Oral QAC breakfast  . montelukast  10 mg Oral QHS  . pantoprazole  40 mg Oral QAC breakfast  . pravastatin  20 mg Oral QHS  . sodium chloride flush  3 mL Intravenous Q12H  . tamsulosin  0.4 mg Oral Daily  . tiotropium  18 mcg Inhalation Daily   Continuous Infusions: PRN Meds:.acetaminophen **OR** acetaminophen, bisacodyl, ipratropium-albuterol, lidocaine, ondansetron **OR** ondansetron (ZOFRAN) IV, traMADol Allergies  Allergen Reactions  . Ranolazine Shortness Of Breath  . Simvastatin Other (See Comments)    fatigue  . Aspirin Other (See Comments)    Causes severe bleeding  . Contrast Media [Iodinated Diagnostic Agents] Other (See Comments)    Will shut kidneys down  . Ibuprofen Other (See Comments)    Nose bleeds  . Ioxaglate Other (See Comments)    Will shut kidneys down   Review of Systems  Constitutional: Positive for activity change (increased weakness), appetite change and fatigue. Negative for fever.  HENT: Positive for congestion, postnasal drip, sinus pressure and sore throat. Negative for hearing loss and trouble swallowing.   Eyes: Negative for visual disturbance.  Respiratory: Negative for chest tightness, shortness of breath and wheezing.   Gastrointestinal: Negative for abdominal distention, abdominal pain, constipation, diarrhea, nausea and vomiting.  Genitourinary: Negative for dysuria, frequency and hematuria.  Musculoskeletal: Negative for back pain.  Skin: Positive for pallor.  Neurological: Positive for weakness. Negative for dizziness, speech difficulty, light-headedness and headaches.  Hematological: Bruises/bleeds easily.  Psychiatric/Behavioral: Negative for confusion and sleep disturbance. The patient is nervous/anxious.    Physical Exam  Constitutional: Vital signs are normal. He has a sickly  appearance.  Elderly man sitting on the side of his bed  HENT:  Mouth/Throat: Oropharynx is clear and moist and mucous membranes are normal.  Packing in left nare. Dried blood around site.   Eyes: EOM are normal.  Neck: Normal range of motion.  Pulmonary/Chest: Effort normal. No respiratory distress.  Abdominal: Soft.  Musculoskeletal: Normal range of motion.  Generalized weakness  Neurological: He is alert.  Skin: Skin is warm and dry. There is pallor.  Skin has a waxy yellow tint to it  Psychiatric: He has a normal mood and affect. His behavior is normal. Judgment and thought content normal.    Vital Signs:  BP (!) 166/54 (BP Location: Left Arm)   Pulse 70   Temp 97.4 F (36.3 C) (Oral)   Resp 18   Ht 6' 1"  (1.854 m)   Wt 96.5 kg (212 lb 11.9 oz)   SpO2 96%   BMI 28.07 kg/m  Pain Assessment: 0-10   Pain Score: 10-Worst pain ever ("it's a little better")   SpO2: SpO2: 96 % O2 Device:SpO2: 96 % O2 Flow Rate: .   IO: Intake/output summary:  Intake/Output Summary (Last 24 hours) at 09/29/16 0735 Last data filed at 09/29/16 0500  Gross per 24 hour  Intake          2411.21 ml  Output             1150 ml  Net          1261.21 ml    LBM: Last BM Date: 09/27/16 Baseline Weight: Weight: 92.5 kg (203 lb 14.8 oz) Most recent weight: Weight: 96.5 kg (212 lb 11.9 oz)     Palliative Assessment/Data:  PPS 40%    Time In: 1000 Time Out: 1130 Time Total: 90 minutes Greater than 50%  of this time was spent counseling and coordinating care related to the above assessment and plan.  Signed by: Charlynn Court, NP Palliative Medicine Team Pager # [phone] (M-F 7a-5p) Team Phone # [phone] (Nights/Weekends)

## 2016-09-29 NOTE — Progress Notes (Signed)
Stopped back by to bring pt copy of New Testament/Psalms -- which he and family in rm really appreciated (wife even asked me to sign it to them) -- as well as little neck pillow for his wife, who'd was glad to have it after all her sitting by his bedside. She was happy to introduce me to their two sons, come to take him home to hospice. Provided spiritual/emotional support to all.    09/29/16 1500  Clinical Encounter Type  Visited With Patient and family together  Visit Type Follow-up;Psychological support;Spiritual support;Social support  Referral From Chaplain  Spiritual Encounters  Spiritual Needs Emotional;Grief support  Stress Factors  Patient Stress Factors Health changes;Loss;Loss of control;Major life changes  Family Stress Factors Exhausted;Family relationships;Health changes;Loss;Loss of control;Major life changes   Gerrit Heck, Chaplain

## 2016-09-29 NOTE — Progress Notes (Signed)
Nutrition Brief Note  Chart reviewed. Pt now transitioning to comfort care.  No further nutrition interventions warranted at this time.  Please re-consult as needed.   Namiyah Grantham A. Hakop Humbarger, RD, LDN, CDE Pager: [phone] After hours Pager: [phone]  

## 2016-09-29 NOTE — Discharge Instructions (Signed)
You have an appointment with Dr. Redmond Baseman on Friday 12/22 at 4:10pm to remove nasal pack. His office is at [address], [place], [place].

## 2016-09-29 NOTE — Care Management Note (Signed)
Case Management Note  Patient Details  Name: LAW ZARA MRN: [id number] Date of Birth: 01-27-1925  Subjective/Objective:    Pt  with recently diagnosed AML presented with uncontrollable epistaxis and profound pancytopenia.            Action/Plan: Plan is to d/c to home with home hospice today.  Expected Discharge Date:  09/29/2016             Expected Discharge Plan:  Home with home hospice  In-House Referral:  NA  Discharge planning Services  CM Consult  Post Acute Care Choice:   Choice offered to:     DME Arranged:    DME Agency:     HH Arranged:  Home hospice [facility] Agency:    Pt was active with [facility] prior to hospital visit (home health RN,PT,NA, SW). At d/c pt has agreed to be d/c with home hospice and selected [facility] to provide home hospice services after CM offered choice to pt/wife. CM made referral with [facility], Kennyth Lose @ [phone].  Status of Service:  Completed, signed off  If discussed at [place] of Stay Meetings, dates discussed:    Additional Comments:  Sharin Mons, RN 09/29/2016, 1:29 PM

## 2016-09-29 NOTE — Discharge Summary (Signed)
Physician Discharge Summary  Brian Mcmillan [id number] DOB: 12/20/1924 DOA: 09/28/2016  PCP: [name], MD  Admit date: 09/28/2016 Discharge date: 09/29/2016   Recommendations for Outpatient Follow-Up:   1. Home with hospice 2. 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  Wound care: None.   History of Present Illness:    Brian Mcmillan is a 82 y.o. male who presents with recently diagnosed AML presenting with uncontrollable epistaxis and profound pancytopenia. Concern for blast crisis. Pt w/ very poor prognosis. Palliative care consult to [facility] (per last admission DC summary pt was "not ready" for hospice).  In discussing case w/ Dr. Earlie Server we will DC his Hydroxyuria as this may be causing his pancytopenia. Pt likely has weeks to live at best.    Hospital Course by Problem:   Left epistaxis and acute leukemia Bleeding is currently controlled with a pack in place.  ENT plans to remove the pack on Friday covering sinonasal infection potential with cephalexin while the pack is in place.   Acute leukemia -home with hospice     Medical Consultants:   ENT  Discharge Exam:   Vitals:   09/29/16 0432 09/29/16 0559  BP: (!) 168/61 (!) 166/54  Pulse: 70 70  Resp:  18  Temp:  97.4 F (36.3 C)   Vitals:   09/29/16 0432 09/29/16 0500 09/29/16 0559  09/29/16 0829  BP: (!) 168/61  (!) 166/54   Pulse: 70  70   Resp:   18   Temp:   97.4 F (36.3 C)   TempSrc:   Oral   SpO2:   96% 92%  Weight:  96.5 kg (212 lb 11.9 oz)    Height:        Gen:  NAD    The results of significant diagnostics from this hospitalization (including imaging, microbiology, ancillary and laboratory) are listed below for reference.     Procedures and Diagnostic Studies:   No results found.   Labs:   Basic Metabolic Panel:  Recent Labs Lab 09/28/16 1215 09/29/16 0551  NA 141 137  K 3.6 3.3*  CL 103 103  CO2  --  25  GLUCOSE 134* 131*  BUN 38* 30*  CREATININE 3.40* 2.28*  CALCIUM  --  9.2   GFR Estimated Creatinine Clearance: 26.9 mL/min (by C-G formula based on SCr of 2.28 mg/dL (H)). Liver Function Tests: No results for input(s): AST, ALT, ALKPHOS, BILITOT, PROT, ALBUMIN in the last 168 hours. No results for input(s): LIPASE, AMYLASE in the last 168 hours. No results for input(s): AMMONIA in the last 168 hours. Coagulation profile  Recent Labs Lab 09/28/16 1108  INR 1.05    CBC:  Recent Labs Lab 09/28/16 1108 09/28/16 1215 09/28/16 1518 09/29/16 0551  WBC 0.8*  --  1.5* 0.9*  HGB 7.0* 7.1* 7.2* 9.4*  HCT 20.9* 21.0* 22.0* 27.6*  MCV 95.4  --  95.7 90.5  PLT 40*  --  50* 34*   Cardiac Enzymes: No results for input(s): CKTOTAL, CKMB, CKMBINDEX, TROPONINI in the last 168 hours. BNP: Invalid input(s): POCBNP CBG: No results for input(s): GLUCAP in the last 168 hours. D-Dimer No results for input(s): DDIMER in the last 72 hours. Hgb A1c No results for input(s): HGBA1C in the last 72 hours. Lipid Profile No results for input(s): CHOL, HDL, LDLCALC, TRIG, CHOLHDL, LDLDIRECT in the last 72 hours. Thyroid function studies No results for input(s): TSH, T4TOTAL, T3FREE, THYROIDAB in the last 72 hours.  Invalid input(s): FREET3 Anemia work up No results for input(s): VITAMINB12, FOLATE, FERRITIN, TIBC, IRON, RETICCTPCT  in the last 72 hours. Microbiology No results found for this or any previous visit (from the past 240 hour(s)).   Discharge Instructions:   Discharge Instructions    Diet general    Complete by:  As directed    Discharge instructions    Complete by:  As directed    Hospice at home Appointment to have packing removed on Friday   Increase activity slowly    Complete by:  As directed      Allergies as of 09/29/2016      Reactions   Ranolazine Shortness Of Breath   Simvastatin Other (See Comments)   fatigue   Aspirin Other (See Comments)   Causes severe bleeding   Contrast Media [iodinated Diagnostic Agents] Other (See Comments)   Will shut kidneys down   Ibuprofen Other (See Comments)   Nose bleeds   Ioxaglate Other (See Comments)   Will shut kidneys down      Medication List    STOP taking these medications   hydroxyurea 500 MG capsule Commonly known as:  HYDREA   multivitamin-iron-minerals-folic acid Tabs tablet   pravastatin 20 MG tablet Commonly known as:  PRAVACHOL     TAKE these medications   acetaminophen 500 MG tablet Commonly known as:  TYLENOL Take 500 mg by mouth every 6 (six) hours as needed for moderate pain.   albuterol-ipratropium 18-103 MCG/ACT inhaler Commonly known as:  COMBIVENT Inhale 2-4 puffs into the lungs 2 (two) times daily as needed for wheezing.   allopurinol 100 MG tablet Commonly known as:  ZYLOPRIM Take 1 tablet (100 mg total) by mouth 2 (two) times daily.   ALPRAZolam 0.5 MG tablet Commonly known as:  XANAX Take 0.5 mg by mouth at bedtime as needed for anxiety.   bisacodyl 10 MG suppository Commonly known as:  DULCOLAX Place 1 suppository (10 mg total) rectally daily as needed for moderate constipation.   carvedilol 6.25 MG tablet Commonly known as:  COREG Take 6.25 mg by mouth 2 (two) times daily with a meal.   cephALEXin 500 MG capsule Commonly known as:  KEFLEX Take 1 capsule (500 mg total) by mouth 4 (four) times  daily.   Fish Oil 1000 MG Cpdr Take 1,000 mg by mouth daily.   fluticasone 50 MCG/ACT nasal spray Commonly known as:  FLONASE Place 2 sprays into both nostrils at bedtime as needed for allergies.   furosemide 40 MG tablet Commonly known as:  LASIX Take 20 mg by mouth daily.   ipratropium 0.03 % nasal spray Commonly known as:  ATROVENT Place 2 sprays into both nostrils 3 (three) times daily.   levothyroxine 125 MCG tablet Commonly known as:  SYNTHROID, LEVOTHROID Take 125 mcg by mouth daily.   montelukast 10 MG tablet Commonly known as:  SINGULAIR Take 10 mg by mouth at bedtime.  nitroGLYCERIN 0.6 mg/hr patch Commonly known as:  NITRODUR - Dosed in mg/24 hr Place 0.6 mg onto the skin daily.   pantoprazole 40 MG tablet Commonly known as:  PROTONIX Take 1 tablet (40 mg total) by mouth daily before breakfast.   tamsulosin 0.4 MG Caps capsule Commonly known as:  FLOMAX Take 0.4 mg by mouth daily.   tiotropium 18 MCG inhalation capsule Commonly known as:  SPIRIVA Place 18 mcg into inhaler and inhale daily.   traMADol 50 MG tablet Commonly known as:  ULTRAM Take 1 tablet (50 mg total) by mouth every 6 (six) hours as needed for moderate pain.   vitamin B-12 1000 MCG tablet Commonly known as:  CYANOCOBALAMIN Take 1,000 mcg by mouth daily.   Vitamin D3 5000 units Caps Take 5,000 Units by mouth daily.         Time coordinating discharge: 35 min  Signed:  JESSICA U VANN   [facility] 09/29/2016, 11:28 AM

## 2016-09-29 NOTE — Progress Notes (Signed)
Responded to consult, visited w/ pt and wife of 67 yrs on rm. Pt has leukemia, has been told there is nothing they can do, and is going home soon to hospice. His own pastor has been here many times, and pt and family's faith is strong. Long ago when they married they gave their lives to the [place]. They have children and grandchildren who will be with them at Christmas. Provided emotional/spiritual support and prayer. Chaplain available for f/u.   09/29/16 1400  Clinical Encounter Type  Visited With Patient and family together  Visit Type Initial;Psychological support;Spiritual support;Social support;Other (Comment) (end of life)  Referral From Nurse  Spiritual Encounters  Spiritual Needs Prayer;Emotional  Stress Factors  Patient Stress Factors Health changes;Loss  Family Stress Factors Family relationships;Health changes;Loss   Gerrit Heck, Chaplain

## 2016-09-30 DIAGNOSIS — R040 Epistaxis: Secondary | ICD-10-CM | POA: Diagnosis not present

## 2016-09-30 DIAGNOSIS — C9210 Chronic myeloid leukemia, BCR/ABL-positive, not having achieved remission: Secondary | ICD-10-CM | POA: Diagnosis not present

## 2016-09-30 DIAGNOSIS — D696 Thrombocytopenia, unspecified: Secondary | ICD-10-CM | POA: Diagnosis not present

## 2016-10-05 ENCOUNTER — Encounter (HOSPITAL_COMMUNITY): Payer: Self-pay

## 2016-10-05 DIAGNOSIS — I509 Heart failure, unspecified: Secondary | ICD-10-CM | POA: Diagnosis not present

## 2016-10-05 DIAGNOSIS — D61818 Other pancytopenia: Secondary | ICD-10-CM | POA: Diagnosis not present

## 2016-10-05 DIAGNOSIS — D6959 Other secondary thrombocytopenia: Secondary | ICD-10-CM | POA: Diagnosis not present

## 2016-10-05 DIAGNOSIS — D630 Anemia in neoplastic disease: Secondary | ICD-10-CM | POA: Diagnosis not present

## 2016-10-05 DIAGNOSIS — I110 Hypertensive heart disease with heart failure: Secondary | ICD-10-CM | POA: Diagnosis not present

## 2016-10-05 DIAGNOSIS — C9210 Chronic myeloid leukemia, BCR/ABL-positive, not having achieved remission: Secondary | ICD-10-CM | POA: Diagnosis not present

## 2016-10-08 DIAGNOSIS — I509 Heart failure, unspecified: Secondary | ICD-10-CM | POA: Diagnosis not present

## 2016-10-08 DIAGNOSIS — D6959 Other secondary thrombocytopenia: Secondary | ICD-10-CM | POA: Diagnosis not present

## 2016-10-08 DIAGNOSIS — D61818 Other pancytopenia: Secondary | ICD-10-CM | POA: Diagnosis not present

## 2016-10-08 DIAGNOSIS — I110 Hypertensive heart disease with heart failure: Secondary | ICD-10-CM | POA: Diagnosis not present

## 2016-10-08 DIAGNOSIS — D630 Anemia in neoplastic disease: Secondary | ICD-10-CM | POA: Diagnosis not present

## 2016-10-08 DIAGNOSIS — C9210 Chronic myeloid leukemia, BCR/ABL-positive, not having achieved remission: Secondary | ICD-10-CM | POA: Diagnosis not present

## 2016-10-11 DIAGNOSIS — D630 Anemia in neoplastic disease: Secondary | ICD-10-CM | POA: Diagnosis not present

## 2016-10-11 DIAGNOSIS — Z515 Encounter for palliative care: Secondary | ICD-10-CM | POA: Diagnosis not present

## 2016-10-11 DIAGNOSIS — D6959 Other secondary thrombocytopenia: Secondary | ICD-10-CM | POA: Diagnosis not present

## 2016-10-11 DIAGNOSIS — I110 Hypertensive heart disease with heart failure: Secondary | ICD-10-CM | POA: Diagnosis not present

## 2016-10-11 DIAGNOSIS — I509 Heart failure, unspecified: Secondary | ICD-10-CM | POA: Diagnosis not present

## 2016-10-11 DIAGNOSIS — C9210 Chronic myeloid leukemia, BCR/ABL-positive, not having achieved remission: Secondary | ICD-10-CM | POA: Diagnosis not present

## 2016-10-11 DIAGNOSIS — D61818 Other pancytopenia: Secondary | ICD-10-CM | POA: Diagnosis not present

## 2016-10-12 DIAGNOSIS — D61818 Other pancytopenia: Secondary | ICD-10-CM | POA: Diagnosis not present

## 2016-10-12 DIAGNOSIS — D6959 Other secondary thrombocytopenia: Secondary | ICD-10-CM | POA: Diagnosis not present

## 2016-10-12 DIAGNOSIS — I509 Heart failure, unspecified: Secondary | ICD-10-CM | POA: Diagnosis not present

## 2016-10-12 DIAGNOSIS — I110 Hypertensive heart disease with heart failure: Secondary | ICD-10-CM | POA: Diagnosis not present

## 2016-10-12 DIAGNOSIS — C9210 Chronic myeloid leukemia, BCR/ABL-positive, not having achieved remission: Secondary | ICD-10-CM | POA: Diagnosis not present

## 2016-10-12 DIAGNOSIS — D630 Anemia in neoplastic disease: Secondary | ICD-10-CM | POA: Diagnosis not present

## 2016-10-15 DIAGNOSIS — D6959 Other secondary thrombocytopenia: Secondary | ICD-10-CM | POA: Diagnosis not present

## 2016-10-15 DIAGNOSIS — D630 Anemia in neoplastic disease: Secondary | ICD-10-CM | POA: Diagnosis not present

## 2016-10-15 DIAGNOSIS — C9210 Chronic myeloid leukemia, BCR/ABL-positive, not having achieved remission: Secondary | ICD-10-CM | POA: Diagnosis not present

## 2016-10-15 DIAGNOSIS — D61818 Other pancytopenia: Secondary | ICD-10-CM | POA: Diagnosis not present

## 2016-10-15 DIAGNOSIS — I110 Hypertensive heart disease with heart failure: Secondary | ICD-10-CM | POA: Diagnosis not present

## 2016-10-15 DIAGNOSIS — I509 Heart failure, unspecified: Secondary | ICD-10-CM | POA: Diagnosis not present

## 2016-10-19 DIAGNOSIS — I509 Heart failure, unspecified: Secondary | ICD-10-CM | POA: Diagnosis not present

## 2016-10-19 DIAGNOSIS — I110 Hypertensive heart disease with heart failure: Secondary | ICD-10-CM | POA: Diagnosis not present

## 2016-10-19 DIAGNOSIS — D630 Anemia in neoplastic disease: Secondary | ICD-10-CM | POA: Diagnosis not present

## 2016-10-19 DIAGNOSIS — D61818 Other pancytopenia: Secondary | ICD-10-CM | POA: Diagnosis not present

## 2016-10-19 DIAGNOSIS — C9210 Chronic myeloid leukemia, BCR/ABL-positive, not having achieved remission: Secondary | ICD-10-CM | POA: Diagnosis not present

## 2016-10-19 DIAGNOSIS — D6959 Other secondary thrombocytopenia: Secondary | ICD-10-CM | POA: Diagnosis not present

## 2016-10-26 DIAGNOSIS — D6959 Other secondary thrombocytopenia: Secondary | ICD-10-CM | POA: Diagnosis not present

## 2016-10-26 DIAGNOSIS — I509 Heart failure, unspecified: Secondary | ICD-10-CM | POA: Diagnosis not present

## 2016-10-26 DIAGNOSIS — C9210 Chronic myeloid leukemia, BCR/ABL-positive, not having achieved remission: Secondary | ICD-10-CM | POA: Diagnosis not present

## 2016-10-26 DIAGNOSIS — D630 Anemia in neoplastic disease: Secondary | ICD-10-CM | POA: Diagnosis not present

## 2016-10-26 DIAGNOSIS — D61818 Other pancytopenia: Secondary | ICD-10-CM | POA: Diagnosis not present

## 2016-10-26 DIAGNOSIS — I110 Hypertensive heart disease with heart failure: Secondary | ICD-10-CM | POA: Diagnosis not present

## 2016-11-02 DIAGNOSIS — M79676 Pain in unspecified toe(s): Secondary | ICD-10-CM | POA: Diagnosis not present

## 2016-11-02 DIAGNOSIS — B351 Tinea unguium: Secondary | ICD-10-CM | POA: Diagnosis not present

## 2016-11-04 DIAGNOSIS — E663 Overweight: Secondary | ICD-10-CM | POA: Diagnosis not present

## 2016-11-04 DIAGNOSIS — N183 Chronic kidney disease, stage 3 (moderate): Secondary | ICD-10-CM | POA: Diagnosis not present

## 2016-11-04 DIAGNOSIS — D696 Thrombocytopenia, unspecified: Secondary | ICD-10-CM | POA: Diagnosis not present

## 2016-11-04 DIAGNOSIS — N289 Disorder of kidney and ureter, unspecified: Secondary | ICD-10-CM | POA: Diagnosis not present

## 2016-11-04 DIAGNOSIS — Z6826 Body mass index (BMI) 26.0-26.9, adult: Secondary | ICD-10-CM | POA: Diagnosis not present

## 2016-11-04 DIAGNOSIS — Z1389 Encounter for screening for other disorder: Secondary | ICD-10-CM | POA: Diagnosis not present

## 2016-11-04 DIAGNOSIS — C9200 Acute myeloblastic leukemia, not having achieved remission: Secondary | ICD-10-CM | POA: Diagnosis not present

## 2016-11-05 DIAGNOSIS — I509 Heart failure, unspecified: Secondary | ICD-10-CM | POA: Diagnosis not present

## 2016-11-05 DIAGNOSIS — C9210 Chronic myeloid leukemia, BCR/ABL-positive, not having achieved remission: Secondary | ICD-10-CM | POA: Diagnosis not present

## 2016-11-05 DIAGNOSIS — D630 Anemia in neoplastic disease: Secondary | ICD-10-CM | POA: Diagnosis not present

## 2016-11-05 DIAGNOSIS — D61818 Other pancytopenia: Secondary | ICD-10-CM | POA: Diagnosis not present

## 2016-11-05 DIAGNOSIS — I110 Hypertensive heart disease with heart failure: Secondary | ICD-10-CM | POA: Diagnosis not present

## 2016-11-05 DIAGNOSIS — D6959 Other secondary thrombocytopenia: Secondary | ICD-10-CM | POA: Diagnosis not present

## 2016-11-11 DIAGNOSIS — D696 Thrombocytopenia, unspecified: Secondary | ICD-10-CM | POA: Diagnosis not present

## 2016-11-11 DIAGNOSIS — M545 Low back pain: Secondary | ICD-10-CM | POA: Diagnosis not present

## 2016-11-11 DIAGNOSIS — D472 Monoclonal gammopathy: Secondary | ICD-10-CM | POA: Diagnosis not present

## 2016-11-11 DIAGNOSIS — Z5181 Encounter for therapeutic drug level monitoring: Secondary | ICD-10-CM | POA: Diagnosis not present

## 2016-11-11 DIAGNOSIS — I110 Hypertensive heart disease with heart failure: Secondary | ICD-10-CM | POA: Diagnosis not present

## 2016-11-11 DIAGNOSIS — N289 Disorder of kidney and ureter, unspecified: Secondary | ICD-10-CM | POA: Diagnosis not present

## 2016-11-11 DIAGNOSIS — D6959 Other secondary thrombocytopenia: Secondary | ICD-10-CM | POA: Diagnosis not present

## 2016-11-11 DIAGNOSIS — I509 Heart failure, unspecified: Secondary | ICD-10-CM | POA: Diagnosis not present

## 2016-11-11 DIAGNOSIS — Z515 Encounter for palliative care: Secondary | ICD-10-CM | POA: Diagnosis not present

## 2016-11-11 DIAGNOSIS — D61818 Other pancytopenia: Secondary | ICD-10-CM | POA: Diagnosis not present

## 2016-11-11 DIAGNOSIS — C9210 Chronic myeloid leukemia, BCR/ABL-positive, not having achieved remission: Secondary | ICD-10-CM | POA: Diagnosis not present

## 2016-11-11 DIAGNOSIS — D630 Anemia in neoplastic disease: Secondary | ICD-10-CM | POA: Diagnosis not present

## 2016-11-11 DIAGNOSIS — G8929 Other chronic pain: Secondary | ICD-10-CM | POA: Diagnosis not present

## 2016-11-11 DIAGNOSIS — C9200 Acute myeloblastic leukemia, not having achieved remission: Secondary | ICD-10-CM | POA: Diagnosis not present

## 2016-11-11 DIAGNOSIS — Z79899 Other long term (current) drug therapy: Secondary | ICD-10-CM | POA: Diagnosis not present

## 2016-11-12 DIAGNOSIS — D696 Thrombocytopenia, unspecified: Secondary | ICD-10-CM | POA: Diagnosis not present

## 2016-11-12 DIAGNOSIS — D472 Monoclonal gammopathy: Secondary | ICD-10-CM | POA: Diagnosis not present

## 2016-11-12 DIAGNOSIS — D72819 Decreased white blood cell count, unspecified: Secondary | ICD-10-CM | POA: Diagnosis not present

## 2016-11-12 DIAGNOSIS — M545 Low back pain: Secondary | ICD-10-CM | POA: Diagnosis not present

## 2016-11-12 DIAGNOSIS — N289 Disorder of kidney and ureter, unspecified: Secondary | ICD-10-CM | POA: Diagnosis not present

## 2016-11-12 DIAGNOSIS — D649 Anemia, unspecified: Secondary | ICD-10-CM | POA: Diagnosis not present

## 2016-11-12 DIAGNOSIS — G8929 Other chronic pain: Secondary | ICD-10-CM | POA: Diagnosis not present

## 2016-11-12 DIAGNOSIS — C9200 Acute myeloblastic leukemia, not having achieved remission: Secondary | ICD-10-CM | POA: Diagnosis not present

## 2016-11-18 DIAGNOSIS — D61818 Other pancytopenia: Secondary | ICD-10-CM | POA: Diagnosis not present

## 2016-11-18 DIAGNOSIS — D6959 Other secondary thrombocytopenia: Secondary | ICD-10-CM | POA: Diagnosis not present

## 2016-11-18 DIAGNOSIS — C9210 Chronic myeloid leukemia, BCR/ABL-positive, not having achieved remission: Secondary | ICD-10-CM | POA: Diagnosis not present

## 2016-11-18 DIAGNOSIS — I110 Hypertensive heart disease with heart failure: Secondary | ICD-10-CM | POA: Diagnosis not present

## 2016-11-18 DIAGNOSIS — D630 Anemia in neoplastic disease: Secondary | ICD-10-CM | POA: Diagnosis not present

## 2016-11-18 DIAGNOSIS — I509 Heart failure, unspecified: Secondary | ICD-10-CM | POA: Diagnosis not present

## 2016-11-29 DIAGNOSIS — E782 Mixed hyperlipidemia: Secondary | ICD-10-CM | POA: Diagnosis not present

## 2016-11-29 DIAGNOSIS — I255 Ischemic cardiomyopathy: Secondary | ICD-10-CM | POA: Diagnosis not present

## 2016-11-29 DIAGNOSIS — I25118 Atherosclerotic heart disease of native coronary artery with other forms of angina pectoris: Secondary | ICD-10-CM | POA: Diagnosis not present

## 2016-11-29 DIAGNOSIS — I5022 Chronic systolic (congestive) heart failure: Secondary | ICD-10-CM | POA: Diagnosis not present

## 2016-11-29 DIAGNOSIS — I10 Essential (primary) hypertension: Secondary | ICD-10-CM | POA: Diagnosis not present

## 2016-12-06 DIAGNOSIS — C9200 Acute myeloblastic leukemia, not having achieved remission: Secondary | ICD-10-CM | POA: Diagnosis not present

## 2016-12-06 DIAGNOSIS — D472 Monoclonal gammopathy: Secondary | ICD-10-CM | POA: Diagnosis not present

## 2016-12-06 DIAGNOSIS — N289 Disorder of kidney and ureter, unspecified: Secondary | ICD-10-CM | POA: Diagnosis not present

## 2016-12-06 DIAGNOSIS — Z79899 Other long term (current) drug therapy: Secondary | ICD-10-CM | POA: Diagnosis not present

## 2016-12-06 DIAGNOSIS — D696 Thrombocytopenia, unspecified: Secondary | ICD-10-CM | POA: Diagnosis not present

## 2016-12-06 DIAGNOSIS — Z5181 Encounter for therapeutic drug level monitoring: Secondary | ICD-10-CM | POA: Diagnosis not present

## 2016-12-06 DIAGNOSIS — M545 Low back pain: Secondary | ICD-10-CM | POA: Diagnosis not present

## 2016-12-06 DIAGNOSIS — G8929 Other chronic pain: Secondary | ICD-10-CM | POA: Diagnosis not present

## 2016-12-31 DIAGNOSIS — E1129 Type 2 diabetes mellitus with other diabetic kidney complication: Secondary | ICD-10-CM | POA: Diagnosis not present

## 2016-12-31 DIAGNOSIS — I10 Essential (primary) hypertension: Secondary | ICD-10-CM | POA: Diagnosis not present

## 2016-12-31 DIAGNOSIS — E663 Overweight: Secondary | ICD-10-CM | POA: Diagnosis not present

## 2016-12-31 DIAGNOSIS — Z6827 Body mass index (BMI) 27.0-27.9, adult: Secondary | ICD-10-CM | POA: Diagnosis not present

## 2016-12-31 DIAGNOSIS — J449 Chronic obstructive pulmonary disease, unspecified: Secondary | ICD-10-CM | POA: Diagnosis not present

## 2016-12-31 DIAGNOSIS — E119 Type 2 diabetes mellitus without complications: Secondary | ICD-10-CM | POA: Diagnosis not present

## 2016-12-31 DIAGNOSIS — E782 Mixed hyperlipidemia: Secondary | ICD-10-CM | POA: Diagnosis not present

## 2017-01-04 DIAGNOSIS — C9200 Acute myeloblastic leukemia, not having achieved remission: Secondary | ICD-10-CM | POA: Diagnosis not present

## 2017-01-04 DIAGNOSIS — J40 Bronchitis, not specified as acute or chronic: Secondary | ICD-10-CM | POA: Diagnosis not present

## 2017-01-04 DIAGNOSIS — Z8639 Personal history of other endocrine, nutritional and metabolic disease: Secondary | ICD-10-CM | POA: Diagnosis not present

## 2017-01-04 DIAGNOSIS — D696 Thrombocytopenia, unspecified: Secondary | ICD-10-CM | POA: Diagnosis not present

## 2017-01-04 DIAGNOSIS — C9201 Acute myeloblastic leukemia, in remission: Secondary | ICD-10-CM | POA: Diagnosis not present

## 2017-01-04 DIAGNOSIS — Z9221 Personal history of antineoplastic chemotherapy: Secondary | ICD-10-CM | POA: Diagnosis not present

## 2017-01-11 DIAGNOSIS — M79676 Pain in unspecified toe(s): Secondary | ICD-10-CM | POA: Diagnosis not present

## 2017-01-11 DIAGNOSIS — B351 Tinea unguium: Secondary | ICD-10-CM | POA: Diagnosis not present

## 2017-01-21 DIAGNOSIS — N138 Other obstructive and reflux uropathy: Secondary | ICD-10-CM | POA: Diagnosis not present

## 2017-01-21 DIAGNOSIS — N281 Cyst of kidney, acquired: Secondary | ICD-10-CM | POA: Diagnosis not present

## 2017-01-21 DIAGNOSIS — N401 Enlarged prostate with lower urinary tract symptoms: Secondary | ICD-10-CM | POA: Diagnosis not present

## 2017-01-21 DIAGNOSIS — N183 Chronic kidney disease, stage 3 (moderate): Secondary | ICD-10-CM | POA: Diagnosis not present

## 2017-01-21 DIAGNOSIS — C642 Malignant neoplasm of left kidney, except renal pelvis: Secondary | ICD-10-CM | POA: Diagnosis not present

## 2017-02-15 DIAGNOSIS — D472 Monoclonal gammopathy: Secondary | ICD-10-CM | POA: Diagnosis not present

## 2017-02-15 DIAGNOSIS — R05 Cough: Secondary | ICD-10-CM | POA: Diagnosis not present

## 2017-02-15 DIAGNOSIS — D72810 Lymphocytopenia: Secondary | ICD-10-CM | POA: Diagnosis not present

## 2017-02-15 DIAGNOSIS — D696 Thrombocytopenia, unspecified: Secondary | ICD-10-CM | POA: Diagnosis not present

## 2017-02-15 DIAGNOSIS — C9200 Acute myeloblastic leukemia, not having achieved remission: Secondary | ICD-10-CM | POA: Diagnosis not present

## 2017-03-11 ENCOUNTER — Encounter (INDEPENDENT_AMBULATORY_CARE_PROVIDER_SITE_OTHER): Payer: Self-pay | Admitting: Internal Medicine

## 2017-03-21 ENCOUNTER — Encounter (INDEPENDENT_AMBULATORY_CARE_PROVIDER_SITE_OTHER): Payer: Self-pay | Admitting: Internal Medicine

## 2017-03-22 DIAGNOSIS — M79676 Pain in unspecified toe(s): Secondary | ICD-10-CM | POA: Diagnosis not present

## 2017-03-22 DIAGNOSIS — B351 Tinea unguium: Secondary | ICD-10-CM | POA: Diagnosis not present

## 2017-03-25 ENCOUNTER — Ambulatory Visit (HOSPITAL_COMMUNITY)
Admission: RE | Admit: 2017-03-25 | Discharge: 2017-03-25 | Disposition: A | Discharge status: Home / Self Care | Payer: Medicare Other | Source: Ambulatory Visit | Attending: Family Medicine | Admitting: Family Medicine

## 2017-03-25 ENCOUNTER — Other Ambulatory Visit (HOSPITAL_COMMUNITY): Payer: Self-pay | Admitting: Family Medicine

## 2017-03-25 DIAGNOSIS — R079 Chest pain, unspecified: Secondary | ICD-10-CM | POA: Diagnosis not present

## 2017-03-25 DIAGNOSIS — I700 Atherosclerosis of aorta: Secondary | ICD-10-CM | POA: Insufficient documentation

## 2017-03-25 DIAGNOSIS — J209 Acute bronchitis, unspecified: Secondary | ICD-10-CM

## 2017-03-25 DIAGNOSIS — J069 Acute upper respiratory infection, unspecified: Secondary | ICD-10-CM | POA: Diagnosis not present

## 2017-03-25 DIAGNOSIS — E663 Overweight: Secondary | ICD-10-CM | POA: Diagnosis not present

## 2017-03-25 DIAGNOSIS — R05 Cough: Secondary | ICD-10-CM | POA: Diagnosis not present

## 2017-03-25 DIAGNOSIS — Z1389 Encounter for screening for other disorder: Secondary | ICD-10-CM | POA: Diagnosis not present

## 2017-03-25 DIAGNOSIS — Z6827 Body mass index (BMI) 27.0-27.9, adult: Secondary | ICD-10-CM | POA: Diagnosis not present

## 2017-04-05 DIAGNOSIS — S2232XG Fracture of one rib, left side, subsequent encounter for fracture with delayed healing: Secondary | ICD-10-CM | POA: Diagnosis not present

## 2017-04-05 DIAGNOSIS — I700 Atherosclerosis of aorta: Secondary | ICD-10-CM | POA: Diagnosis not present

## 2017-04-05 DIAGNOSIS — R0789 Other chest pain: Secondary | ICD-10-CM | POA: Diagnosis not present

## 2017-04-05 DIAGNOSIS — D696 Thrombocytopenia, unspecified: Secondary | ICD-10-CM | POA: Diagnosis not present

## 2017-04-05 DIAGNOSIS — C9200 Acute myeloblastic leukemia, not having achieved remission: Secondary | ICD-10-CM | POA: Diagnosis not present

## 2017-04-05 DIAGNOSIS — R918 Other nonspecific abnormal finding of lung field: Secondary | ICD-10-CM | POA: Diagnosis not present

## 2017-05-02 DIAGNOSIS — I25118 Atherosclerotic heart disease of native coronary artery with other forms of angina pectoris: Secondary | ICD-10-CM | POA: Diagnosis not present

## 2017-05-02 DIAGNOSIS — I255 Ischemic cardiomyopathy: Secondary | ICD-10-CM | POA: Diagnosis not present

## 2017-05-02 DIAGNOSIS — R040 Epistaxis: Secondary | ICD-10-CM | POA: Diagnosis not present

## 2017-05-02 DIAGNOSIS — I10 Essential (primary) hypertension: Secondary | ICD-10-CM | POA: Diagnosis not present

## 2017-05-02 DIAGNOSIS — E782 Mixed hyperlipidemia: Secondary | ICD-10-CM | POA: Diagnosis not present

## 2017-05-02 DIAGNOSIS — I5022 Chronic systolic (congestive) heart failure: Secondary | ICD-10-CM | POA: Diagnosis not present

## 2017-05-02 DIAGNOSIS — J439 Emphysema, unspecified: Secondary | ICD-10-CM | POA: Diagnosis not present

## 2017-05-02 DIAGNOSIS — N183 Chronic kidney disease, stage 3 (moderate): Secondary | ICD-10-CM | POA: Diagnosis not present

## 2017-05-02 DIAGNOSIS — I208 Other forms of angina pectoris: Secondary | ICD-10-CM | POA: Diagnosis not present

## 2017-05-30 DIAGNOSIS — Z85828 Personal history of other malignant neoplasm of skin: Secondary | ICD-10-CM | POA: Diagnosis not present

## 2017-05-30 DIAGNOSIS — C9200 Acute myeloblastic leukemia, not having achieved remission: Secondary | ICD-10-CM | POA: Diagnosis not present

## 2017-05-30 DIAGNOSIS — L989 Disorder of the skin and subcutaneous tissue, unspecified: Secondary | ICD-10-CM | POA: Diagnosis not present

## 2017-05-30 DIAGNOSIS — I10 Essential (primary) hypertension: Secondary | ICD-10-CM | POA: Diagnosis not present

## 2017-05-31 DIAGNOSIS — M79676 Pain in unspecified toe(s): Secondary | ICD-10-CM | POA: Diagnosis not present

## 2017-05-31 DIAGNOSIS — B351 Tinea unguium: Secondary | ICD-10-CM | POA: Diagnosis not present

## 2017-06-07 DIAGNOSIS — Z85828 Personal history of other malignant neoplasm of skin: Secondary | ICD-10-CM | POA: Diagnosis not present

## 2017-06-07 DIAGNOSIS — C44529 Squamous cell carcinoma of skin of other part of trunk: Secondary | ICD-10-CM | POA: Diagnosis not present

## 2017-06-07 DIAGNOSIS — C44329 Squamous cell carcinoma of skin of other parts of face: Secondary | ICD-10-CM | POA: Diagnosis not present

## 2017-06-07 DIAGNOSIS — C44129 Squamous cell carcinoma of skin of left eyelid, including canthus: Secondary | ICD-10-CM | POA: Diagnosis not present

## 2017-06-07 DIAGNOSIS — C44629 Squamous cell carcinoma of skin of left upper limb, including shoulder: Secondary | ICD-10-CM | POA: Diagnosis not present

## 2017-06-16 DIAGNOSIS — C44329 Squamous cell carcinoma of skin of other parts of face: Secondary | ICD-10-CM | POA: Diagnosis not present

## 2017-06-16 DIAGNOSIS — C4442 Squamous cell carcinoma of skin of scalp and neck: Secondary | ICD-10-CM | POA: Diagnosis not present

## 2017-06-23 DIAGNOSIS — Z4802 Encounter for removal of sutures: Secondary | ICD-10-CM | POA: Diagnosis not present

## 2017-07-11 DIAGNOSIS — C9200 Acute myeloblastic leukemia, not having achieved remission: Secondary | ICD-10-CM | POA: Diagnosis not present

## 2017-07-11 DIAGNOSIS — D696 Thrombocytopenia, unspecified: Secondary | ICD-10-CM | POA: Diagnosis not present

## 2017-07-11 DIAGNOSIS — D709 Neutropenia, unspecified: Secondary | ICD-10-CM | POA: Diagnosis not present

## 2017-07-12 ENCOUNTER — Ambulatory Visit (INDEPENDENT_AMBULATORY_CARE_PROVIDER_SITE_OTHER): Payer: Medicare Other | Admitting: Internal Medicine

## 2017-07-26 ENCOUNTER — Encounter (INDEPENDENT_AMBULATORY_CARE_PROVIDER_SITE_OTHER): Payer: Self-pay | Admitting: Internal Medicine

## 2017-07-26 ENCOUNTER — Ambulatory Visit (INDEPENDENT_AMBULATORY_CARE_PROVIDER_SITE_OTHER): Payer: Medicare Other | Admitting: Internal Medicine

## 2017-07-26 ENCOUNTER — Encounter (INDEPENDENT_AMBULATORY_CARE_PROVIDER_SITE_OTHER): Payer: Self-pay

## 2017-07-26 VITALS — BP 134/80 | HR 60 | Temp 97.7°F | Resp 18 | Ht 74.0 in | Wt 194.6 lb

## 2017-07-26 DIAGNOSIS — R1310 Dysphagia, unspecified: Secondary | ICD-10-CM

## 2017-07-26 DIAGNOSIS — K219 Gastro-esophageal reflux disease without esophagitis: Secondary | ICD-10-CM

## 2017-07-26 DIAGNOSIS — R1319 Other dysphagia: Secondary | ICD-10-CM

## 2017-07-26 MED ORDER — PANTOPRAZOLE SODIUM 40 MG PO TBEC: 40.0000 mg | DELAYED_RELEASE_TABLET | Freq: Every day | ORAL | 3 refills | Status: DC

## 2017-07-26 NOTE — Progress Notes (Signed)
Presenting complaint;  Follow-up for GERD and dysphagia.  Subjective:  Patient is 83 year old Caucasian male who is here for scheduled visit. He was last seen in June 2013. Begun on pantoprazole. He has not had dysphagia or heartburn anymore. He needs new prescription. He is not having any side effects. He has lost 11 pounds in 16 months. He is happy that he was able to do so. He denies nausea vomiting or abdominal pain. He says he has occasional heartburn with greasy foods. His bowels move every day like clockwork. He denies melena or rectal bleeding. He states he has done well since he s   Current Medications: Outpatient Encounter Prescriptions as of 07/26/2017  Medication Sig  . acetaminophen (TYLENOL) 500 MG tablet Take 500 mg by mouth every 6 (six) hours as needed for moderate pain.  Marland Kitchen albuterol-ipratropium (COMBIVENT) 18-103 MCG/ACT inhaler Inhale 2-4 puffs into the lungs 2 (two) times daily as needed for wheezing.   Marland Kitchen allopurinol (ZYLOPRIM) 100 MG tablet Take 1 tablet (100 mg total) by mouth 2 (two) times daily.  . carvedilol (COREG) 6.25 MG tablet Take 6.25 mg by mouth 2 (two) times daily with a meal.    . Cholecalciferol (VITAMIN D3) 5000 UNITS CAPS Take 5,000 Units by mouth daily.   . fluticasone (FLONASE) 50 MCG/ACT nasal spray Place 2 sprays into both nostrils at bedtime as needed for allergies.   . furosemide (LASIX) 40 MG tablet Take 20 mg by mouth daily.   Marland Kitchen ipratropium (ATROVENT) 0.03 % nasal spray Place 2 sprays into both nostrils 3 (three) times daily.   Marland Kitchen levothyroxine (SYNTHROID, LEVOTHROID) 125 MCG tablet Take 125 mcg by mouth daily.   . montelukast (SINGULAIR) 10 MG tablet Take 10 mg by mouth at bedtime.   . nitroGLYCERIN (NITRODUR - DOSED IN MG/24 HR) 0.6 mg/hr patch Place 0.6 mg onto the skin daily.  . Omega-3 Fatty Acids (FISH OIL) 1000 MG CPDR Take 1,000 mg by mouth daily.  . pravastatin (PRAVACHOL) 20 MG tablet Take 20 mg by mouth every evening.  . tamsulosin  (FLOMAX) 0.4 MG CAPS capsule Take 0.4 mg by mouth daily.  Marland Kitchen tiotropium (SPIRIVA) 18 MCG inhalation capsule Place 18 mcg into inhaler and inhale daily.  . vitamin B-12 (CYANOCOBALAMIN) 1000 MCG tablet Take 1,000 mcg by mouth daily.    . pantoprazole (PROTONIX) 40 MG tablet Take 1 tablet (40 mg total) by mouth daily before breakfast. (Patient not taking: Reported on 07/26/2017)  . [DISCONTINUED] ALPRAZolam (XANAX) 0.5 MG tablet Take 0.5 mg by mouth at bedtime as needed for anxiety.  . [DISCONTINUED] bisacodyl (DULCOLAX) 10 MG suppository Place 1 suppository (10 mg total) rectally daily as needed for moderate constipation. (Patient not taking: Reported on 07/26/2017)  . [DISCONTINUED] cephALEXin (KEFLEX) 500 MG capsule Take 1 capsule (500 mg total) by mouth 4 (four) times daily. (Patient not taking: Reported on 07/26/2017)  . [DISCONTINUED] traMADol (ULTRAM) 50 MG tablet Take 1 tablet (50 mg total) by mouth every 6 (six) hours as needed for moderate pain. (Patient not taking: Reported on 07/26/2017)   No facility-administered encounter medications on file as of 07/26/2017.      Objective: Blood pressure 134/80, pulse 60, temperature 97.7 F (36.5 C), temperature source Oral, resp. rate 18, height 6\' 2"  (1.88 m), weight 194 lb 9.6 oz (88.3 kg). Patient is alert and in NAD. Conjunctiva is pink. Sclera is nonicteric Oropharyngeal mucosa is normal. No neck masses or thyromegaly noted. Cardiac exam with regular rhythm normal S1 and  S2. No murmur or gallop noted. Lungs are clear to auscultation. Abdomen is full but soft and nontender without organomegaly or masses.   No LE edema or clubbing noted.    Assessment:  #1. Chronic GERD. He is doing well with therapy. He may try PPI every other day.   #2. Esophageal dysphagia. He was felt to have dysphagia due to EMD but surprisingly he has responded to PPI therapy.    Plan:  New prescription given for pantoprazole 40 mg by outh every morning 90  doses with 3  refills.  However patient can try this medication every other day. If it does not work he can go back to daily schedule.  Office visit in one year.

## 2017-07-26 NOTE — Patient Instructions (Addendum)
Can try pantoprazole every other day and if it does not work can go back to every morning.

## 2017-08-09 DIAGNOSIS — M79676 Pain in unspecified toe(s): Secondary | ICD-10-CM | POA: Diagnosis not present

## 2017-08-09 DIAGNOSIS — B351 Tinea unguium: Secondary | ICD-10-CM | POA: Diagnosis not present

## 2017-09-13 DIAGNOSIS — D6959 Other secondary thrombocytopenia: Secondary | ICD-10-CM | POA: Diagnosis not present

## 2017-09-13 DIAGNOSIS — Z9221 Personal history of antineoplastic chemotherapy: Secondary | ICD-10-CM | POA: Diagnosis not present

## 2017-09-13 DIAGNOSIS — T451X5S Adverse effect of antineoplastic and immunosuppressive drugs, sequela: Secondary | ICD-10-CM | POA: Diagnosis not present

## 2017-09-13 DIAGNOSIS — D701 Agranulocytosis secondary to cancer chemotherapy: Secondary | ICD-10-CM | POA: Diagnosis not present

## 2017-09-13 DIAGNOSIS — C9200 Acute myeloblastic leukemia, not having achieved remission: Secondary | ICD-10-CM | POA: Diagnosis not present

## 2017-09-15 DIAGNOSIS — Z23 Encounter for immunization: Secondary | ICD-10-CM | POA: Diagnosis not present

## 2017-10-07 DIAGNOSIS — C9210 Chronic myeloid leukemia, BCR/ABL-positive, not having achieved remission: Secondary | ICD-10-CM | POA: Diagnosis not present

## 2017-10-07 DIAGNOSIS — N281 Cyst of kidney, acquired: Secondary | ICD-10-CM | POA: Diagnosis not present

## 2017-10-07 DIAGNOSIS — N401 Enlarged prostate with lower urinary tract symptoms: Secondary | ICD-10-CM | POA: Diagnosis not present

## 2017-10-07 DIAGNOSIS — Z85528 Personal history of other malignant neoplasm of kidney: Secondary | ICD-10-CM | POA: Diagnosis not present

## 2017-10-07 DIAGNOSIS — N183 Chronic kidney disease, stage 3 (moderate): Secondary | ICD-10-CM | POA: Diagnosis not present

## 2017-10-07 DIAGNOSIS — N138 Other obstructive and reflux uropathy: Secondary | ICD-10-CM | POA: Diagnosis not present

## 2017-10-25 DIAGNOSIS — B351 Tinea unguium: Secondary | ICD-10-CM | POA: Diagnosis not present

## 2017-10-25 DIAGNOSIS — M79676 Pain in unspecified toe(s): Secondary | ICD-10-CM | POA: Diagnosis not present

## 2017-11-01 DIAGNOSIS — I25118 Atherosclerotic heart disease of native coronary artery with other forms of angina pectoris: Secondary | ICD-10-CM | POA: Diagnosis not present

## 2017-11-01 DIAGNOSIS — I10 Essential (primary) hypertension: Secondary | ICD-10-CM | POA: Diagnosis not present

## 2017-11-01 DIAGNOSIS — N183 Chronic kidney disease, stage 3 (moderate): Secondary | ICD-10-CM | POA: Diagnosis not present

## 2017-11-01 DIAGNOSIS — I5022 Chronic systolic (congestive) heart failure: Secondary | ICD-10-CM | POA: Diagnosis not present

## 2017-11-01 DIAGNOSIS — I208 Other forms of angina pectoris: Secondary | ICD-10-CM | POA: Diagnosis not present

## 2017-11-01 DIAGNOSIS — I255 Ischemic cardiomyopathy: Secondary | ICD-10-CM | POA: Diagnosis not present

## 2017-11-01 DIAGNOSIS — E782 Mixed hyperlipidemia: Secondary | ICD-10-CM | POA: Diagnosis not present

## 2017-11-09 DIAGNOSIS — I208 Other forms of angina pectoris: Secondary | ICD-10-CM | POA: Diagnosis not present

## 2017-11-09 DIAGNOSIS — I255 Ischemic cardiomyopathy: Secondary | ICD-10-CM | POA: Diagnosis not present

## 2017-11-09 DIAGNOSIS — I25118 Atherosclerotic heart disease of native coronary artery with other forms of angina pectoris: Secondary | ICD-10-CM | POA: Diagnosis not present

## 2017-11-09 DIAGNOSIS — I130 Hypertensive heart and chronic kidney disease with heart failure and stage 1 through stage 4 chronic kidney disease, or unspecified chronic kidney disease: Secondary | ICD-10-CM | POA: Diagnosis not present

## 2017-11-09 DIAGNOSIS — I071 Rheumatic tricuspid insufficiency: Secondary | ICD-10-CM | POA: Diagnosis not present

## 2017-11-09 DIAGNOSIS — I517 Cardiomegaly: Secondary | ICD-10-CM | POA: Diagnosis not present

## 2017-11-09 DIAGNOSIS — R931 Abnormal findings on diagnostic imaging of heart and coronary circulation: Secondary | ICD-10-CM | POA: Diagnosis not present

## 2017-11-09 DIAGNOSIS — E782 Mixed hyperlipidemia: Secondary | ICD-10-CM | POA: Diagnosis not present

## 2017-11-09 DIAGNOSIS — I5022 Chronic systolic (congestive) heart failure: Secondary | ICD-10-CM | POA: Diagnosis not present

## 2017-11-09 DIAGNOSIS — I129 Hypertensive chronic kidney disease with stage 1 through stage 4 chronic kidney disease, or unspecified chronic kidney disease: Secondary | ICD-10-CM | POA: Diagnosis not present

## 2017-11-09 DIAGNOSIS — I361 Nonrheumatic tricuspid (valve) insufficiency: Secondary | ICD-10-CM | POA: Diagnosis not present

## 2017-11-09 DIAGNOSIS — N183 Chronic kidney disease, stage 3 (moderate): Secondary | ICD-10-CM | POA: Diagnosis not present

## 2017-11-15 DIAGNOSIS — D472 Monoclonal gammopathy: Secondary | ICD-10-CM | POA: Diagnosis not present

## 2017-11-15 DIAGNOSIS — I5022 Chronic systolic (congestive) heart failure: Secondary | ICD-10-CM | POA: Diagnosis not present

## 2017-11-15 DIAGNOSIS — C9200 Acute myeloblastic leukemia, not having achieved remission: Secondary | ICD-10-CM | POA: Diagnosis not present

## 2017-11-15 DIAGNOSIS — M545 Low back pain: Secondary | ICD-10-CM | POA: Diagnosis not present

## 2017-11-15 DIAGNOSIS — G8929 Other chronic pain: Secondary | ICD-10-CM | POA: Diagnosis not present

## 2017-11-24 DIAGNOSIS — E663 Overweight: Secondary | ICD-10-CM | POA: Diagnosis not present

## 2017-11-24 DIAGNOSIS — Z6827 Body mass index (BMI) 27.0-27.9, adult: Secondary | ICD-10-CM | POA: Diagnosis not present

## 2017-11-24 DIAGNOSIS — J019 Acute sinusitis, unspecified: Secondary | ICD-10-CM | POA: Diagnosis not present

## 2017-11-24 DIAGNOSIS — E119 Type 2 diabetes mellitus without complications: Secondary | ICD-10-CM | POA: Diagnosis not present

## 2018-01-03 DIAGNOSIS — B351 Tinea unguium: Secondary | ICD-10-CM | POA: Diagnosis not present

## 2018-01-03 DIAGNOSIS — M79676 Pain in unspecified toe(s): Secondary | ICD-10-CM | POA: Diagnosis not present

## 2018-01-10 DIAGNOSIS — C9200 Acute myeloblastic leukemia, not having achieved remission: Secondary | ICD-10-CM | POA: Diagnosis not present

## 2018-01-10 DIAGNOSIS — R0602 Shortness of breath: Secondary | ICD-10-CM | POA: Diagnosis not present

## 2018-01-10 DIAGNOSIS — F17200 Nicotine dependence, unspecified, uncomplicated: Secondary | ICD-10-CM | POA: Diagnosis not present

## 2018-01-10 DIAGNOSIS — D696 Thrombocytopenia, unspecified: Secondary | ICD-10-CM | POA: Diagnosis not present

## 2018-01-10 DIAGNOSIS — R040 Epistaxis: Secondary | ICD-10-CM | POA: Diagnosis not present

## 2018-01-10 DIAGNOSIS — I5022 Chronic systolic (congestive) heart failure: Secondary | ICD-10-CM | POA: Diagnosis not present

## 2018-01-10 DIAGNOSIS — D472 Monoclonal gammopathy: Secondary | ICD-10-CM | POA: Diagnosis not present

## 2018-02-03 DIAGNOSIS — E1122 Type 2 diabetes mellitus with diabetic chronic kidney disease: Secondary | ICD-10-CM | POA: Diagnosis not present

## 2018-02-03 DIAGNOSIS — N401 Enlarged prostate with lower urinary tract symptoms: Secondary | ICD-10-CM | POA: Diagnosis not present

## 2018-02-03 DIAGNOSIS — N138 Other obstructive and reflux uropathy: Secondary | ICD-10-CM | POA: Diagnosis not present

## 2018-02-03 DIAGNOSIS — N281 Cyst of kidney, acquired: Secondary | ICD-10-CM | POA: Diagnosis not present

## 2018-02-03 DIAGNOSIS — R82998 Other abnormal findings in urine: Secondary | ICD-10-CM | POA: Diagnosis not present

## 2018-02-03 DIAGNOSIS — C642 Malignant neoplasm of left kidney, except renal pelvis: Secondary | ICD-10-CM | POA: Diagnosis not present

## 2018-02-03 DIAGNOSIS — C9210 Chronic myeloid leukemia, BCR/ABL-positive, not having achieved remission: Secondary | ICD-10-CM | POA: Diagnosis not present

## 2018-02-03 DIAGNOSIS — Z85528 Personal history of other malignant neoplasm of kidney: Secondary | ICD-10-CM | POA: Diagnosis not present

## 2018-02-03 DIAGNOSIS — N183 Chronic kidney disease, stage 3 (moderate): Secondary | ICD-10-CM | POA: Diagnosis not present

## 2018-03-07 DIAGNOSIS — B351 Tinea unguium: Secondary | ICD-10-CM | POA: Diagnosis not present

## 2018-03-07 DIAGNOSIS — M79676 Pain in unspecified toe(s): Secondary | ICD-10-CM | POA: Diagnosis not present

## 2018-03-12 IMAGING — CR DG FOOT COMPLETE 3+V*L*
3 series · 3 of 3 positions shown · non-contrast
Comparison: None.

CLINICAL DATA: Bilateral ankle swelling worse over the past week.
Fall.

EXAM:
LEFT FOOT - COMPLETE 3+ VIEW

[x foot ap left]
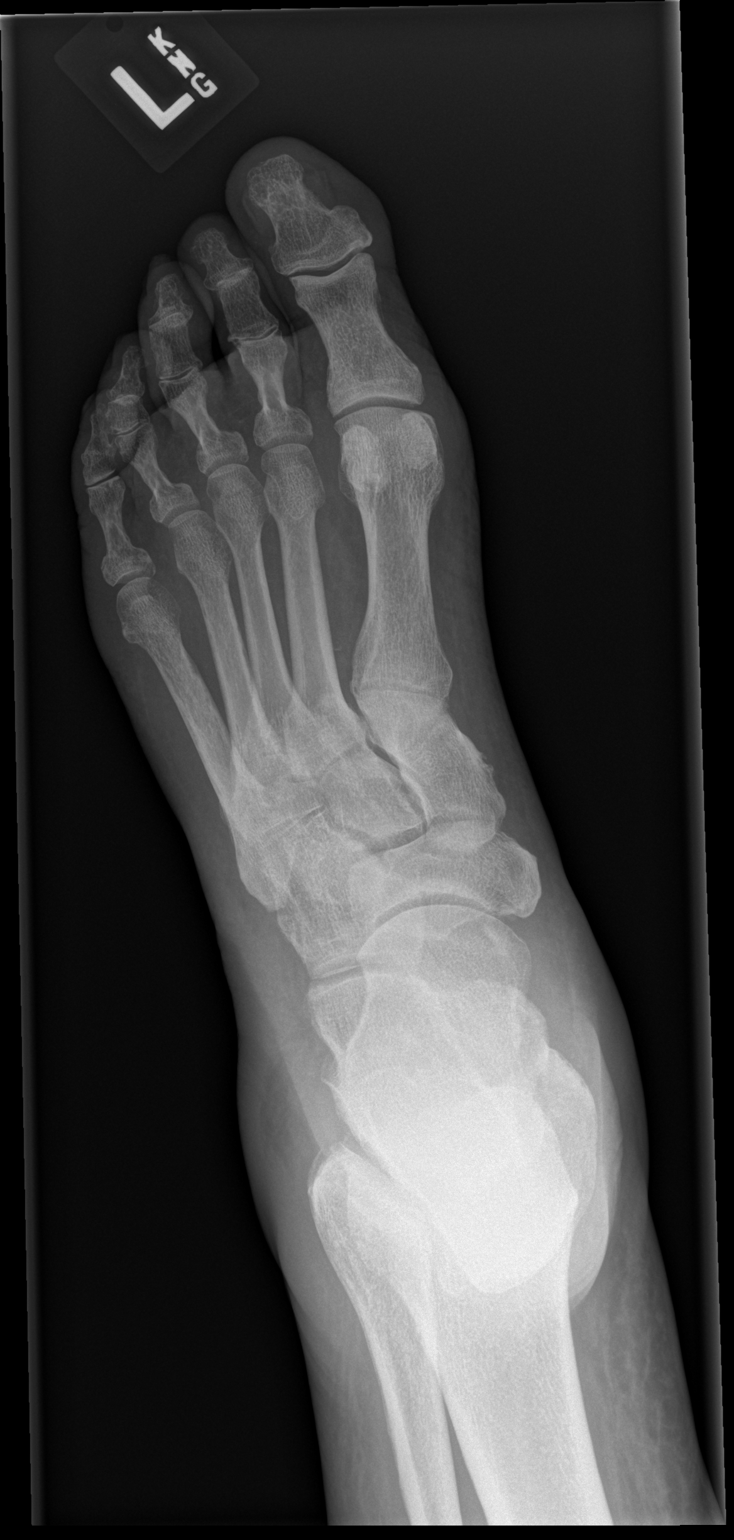

[x foot obl left]
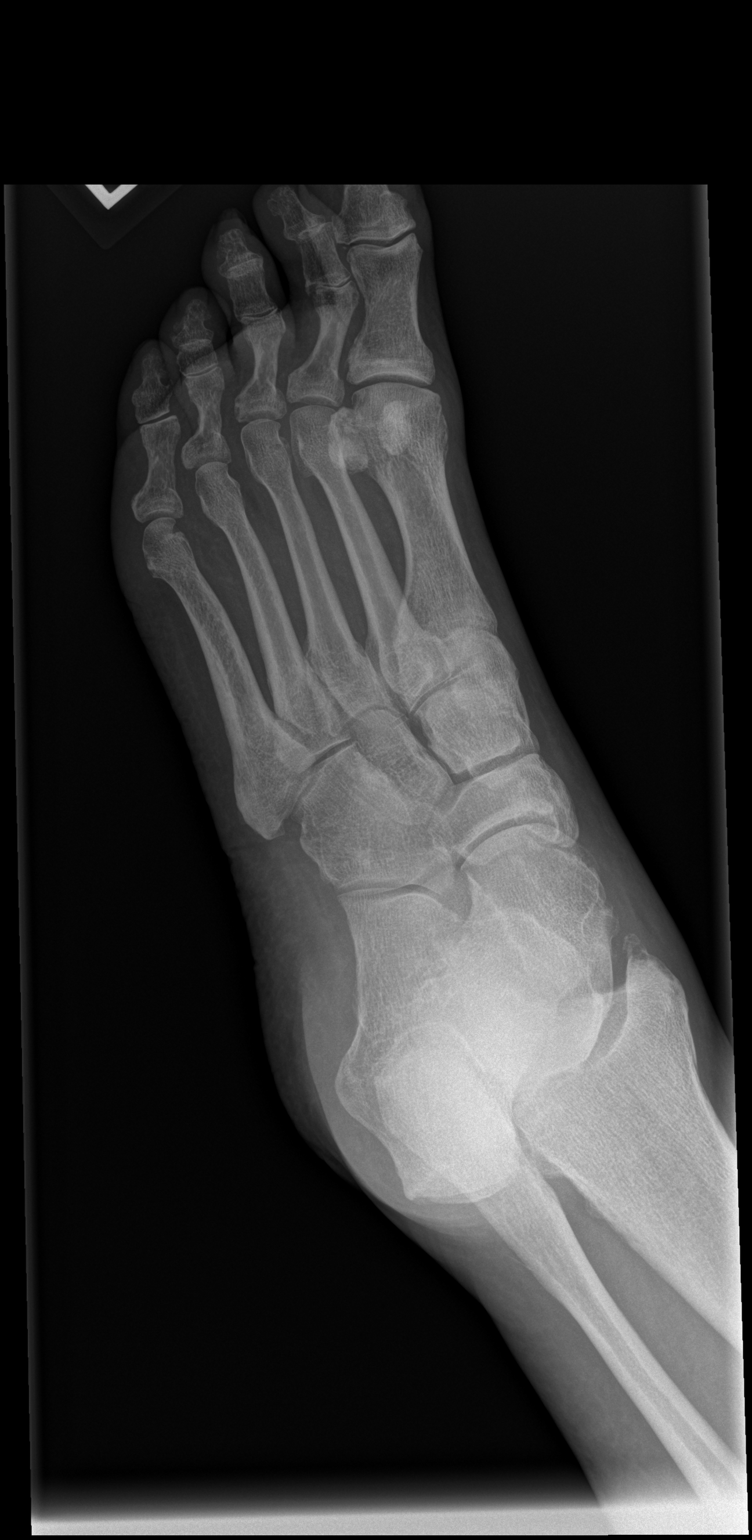

[x foot lat left]
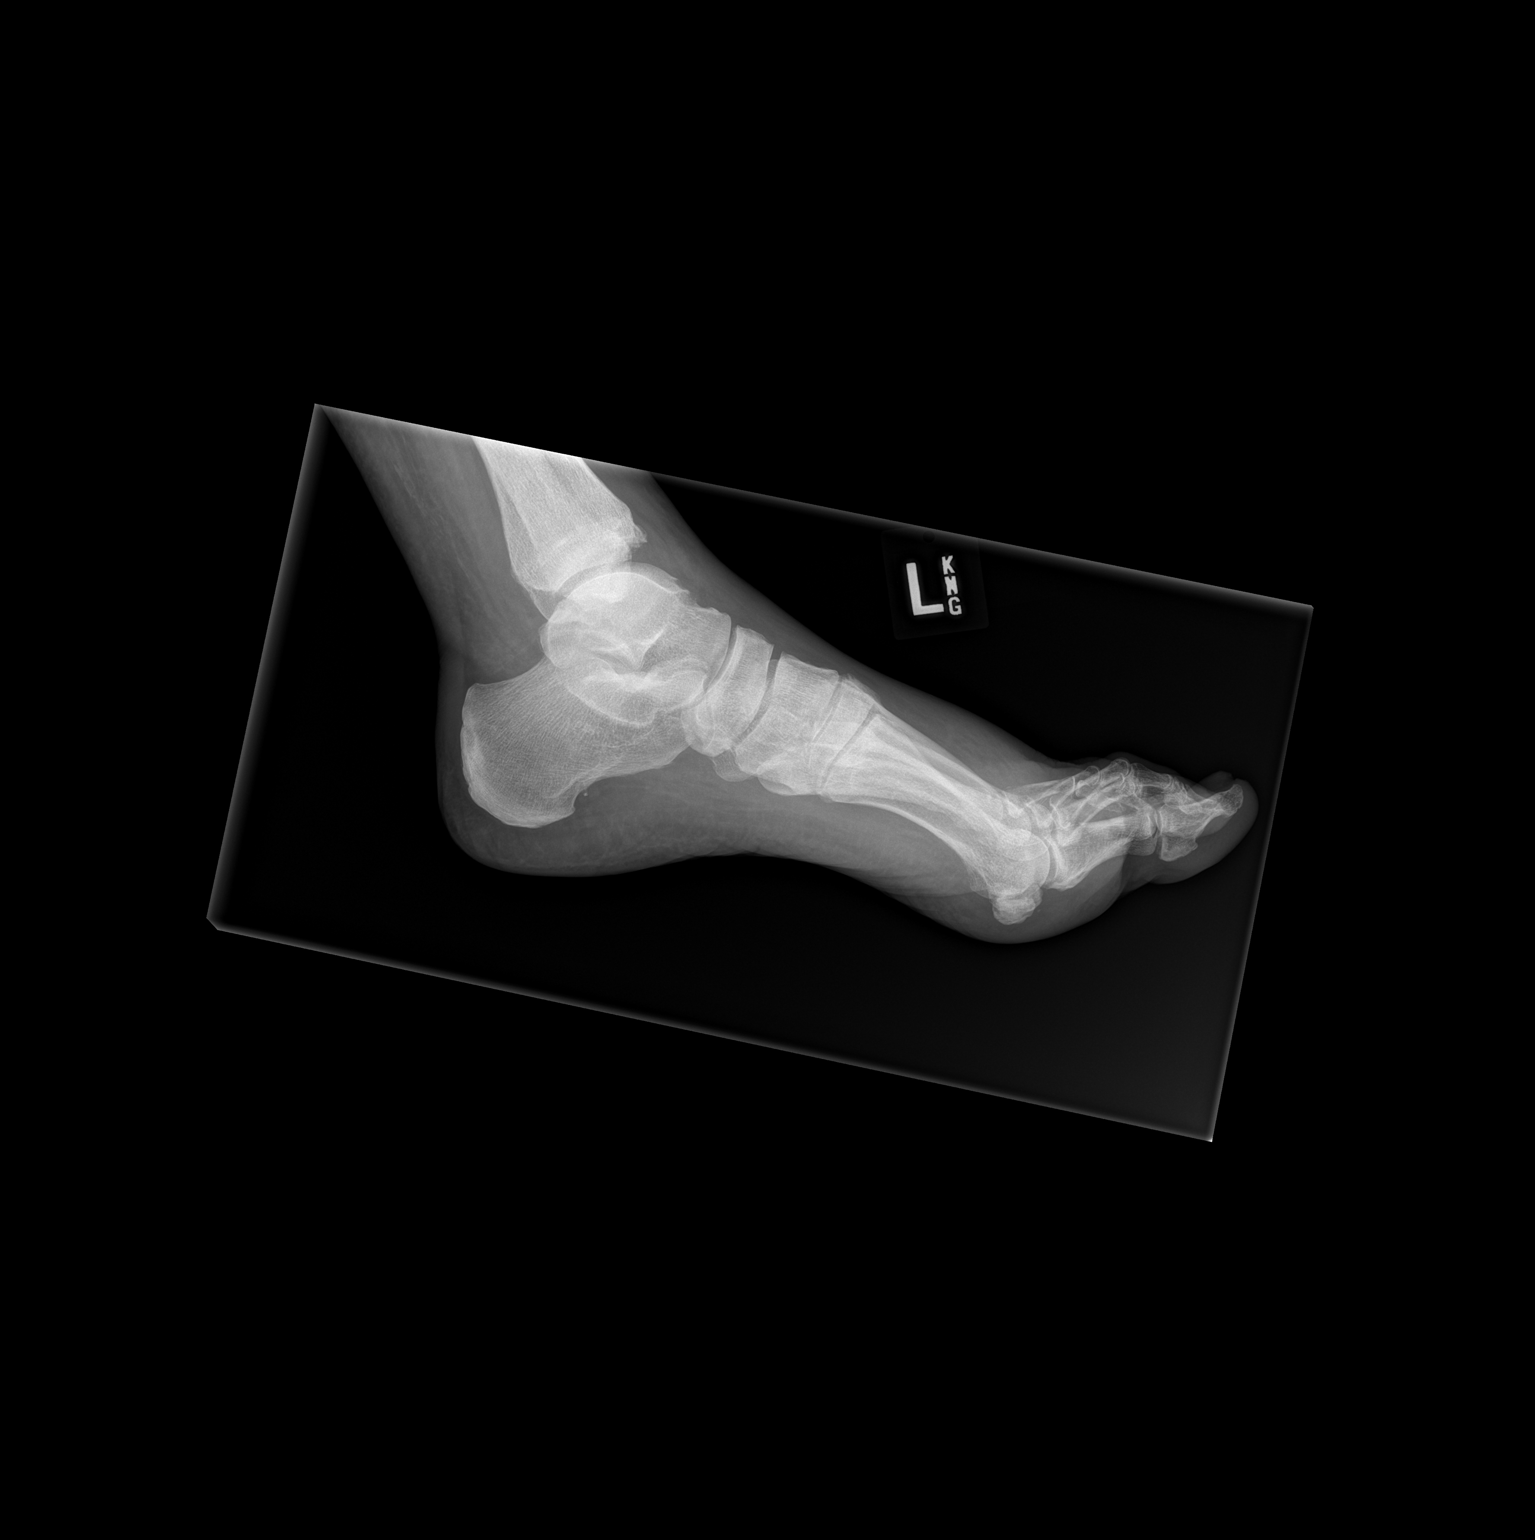

[3 of 3 positions shown; findings below may reference images not displayed]

FINDINGS: Soft tissue swelling over the left forefoot. Mild degenerative
changes in the intertarsal and first metatarsal-phalangeal joints.
No acute fracture or dislocation. No focal bone lesion or bone
destruction.
IMPRESSION: Mild soft tissue swelling. Degenerative changes. No acute bony
abnormalities.

## 2018-03-14 DIAGNOSIS — D472 Monoclonal gammopathy: Secondary | ICD-10-CM | POA: Diagnosis not present

## 2018-03-14 DIAGNOSIS — C9200 Acute myeloblastic leukemia, not having achieved remission: Secondary | ICD-10-CM | POA: Diagnosis not present

## 2018-03-18 IMAGING — CT CT BIOPSY
1 of 3 series · 15 of 28 positions shown, 19 images · non-contrast
Comparison: none

INDICATION: Leukocytosis

[Series 2: i-spiral 5.0 b70f · axial · 0.85mm/px · z∈[-568,-474]mm · 15 of 31 slices shown, 19 images]
[im 2/31  mediastinal]
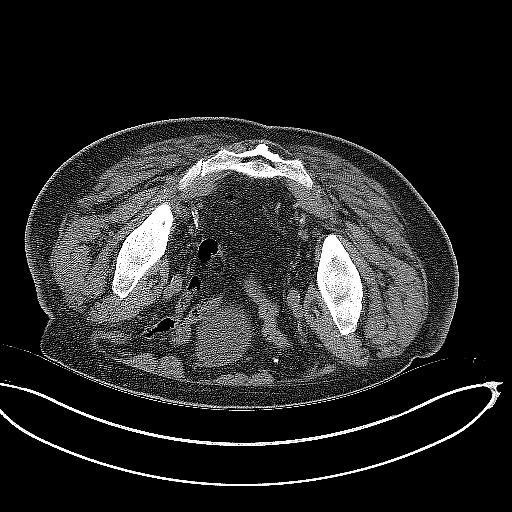
[im 2/31  lung]
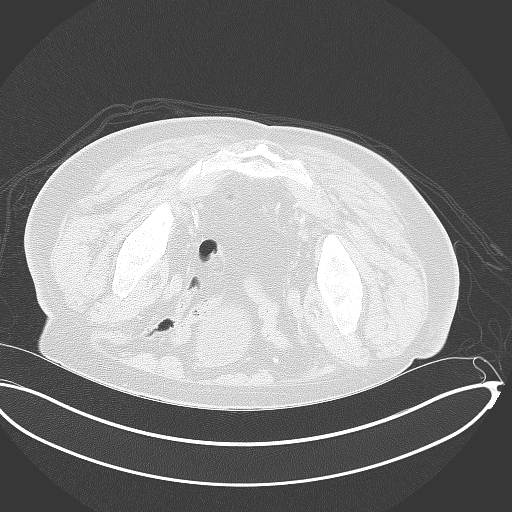
[im 4/31  lung]
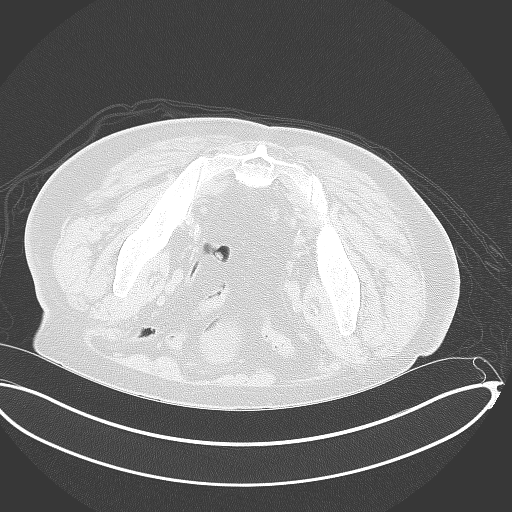
[im 6/31  lung]
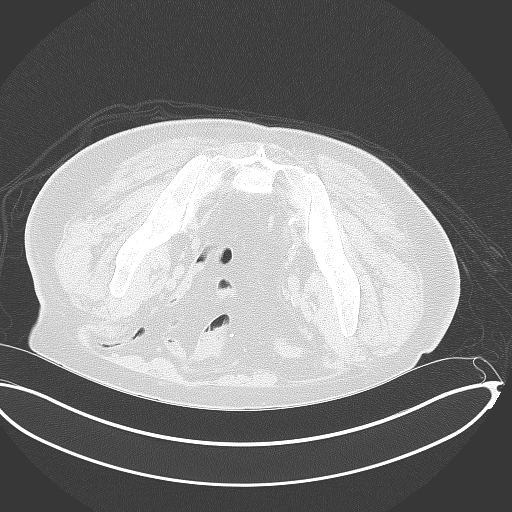
[im 7/31  lung]
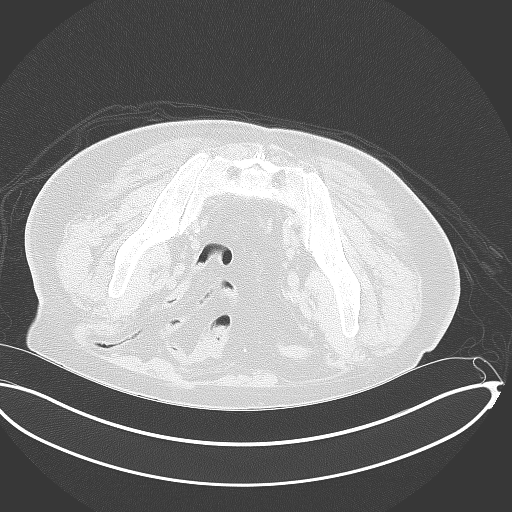
[im 11/31  mediastinal]
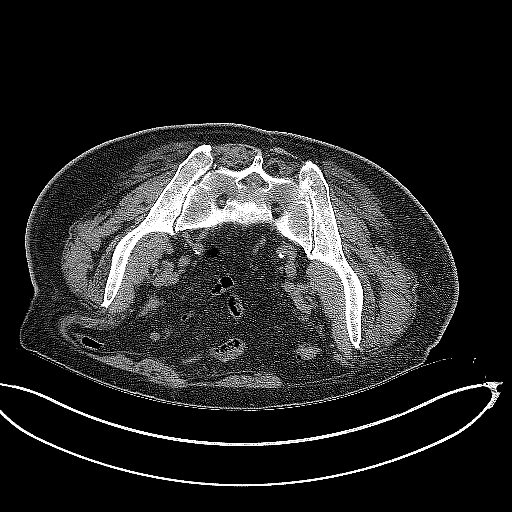
[im 11/31  lung]
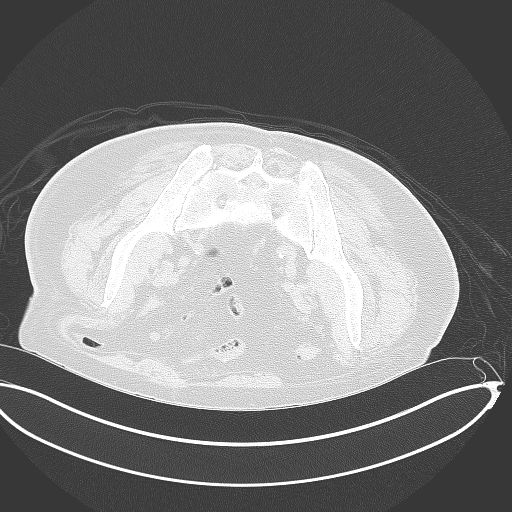
[im 12/31  lung]
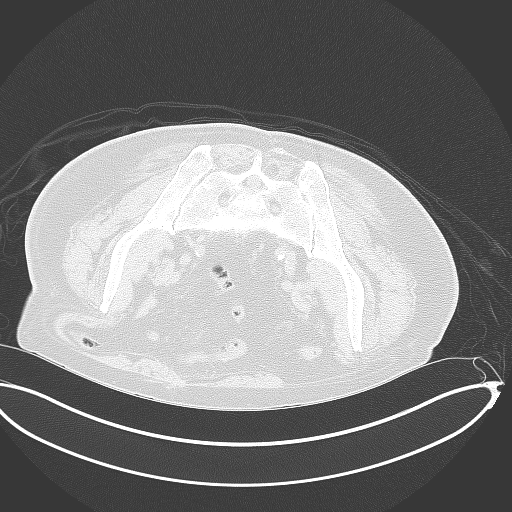
[im 14/31  lung]
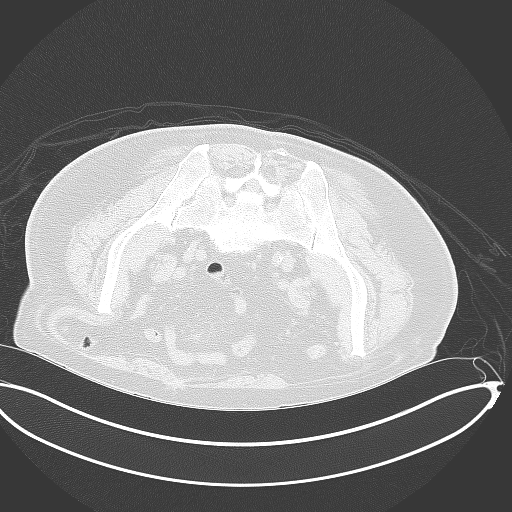
[im 16/31  lung]
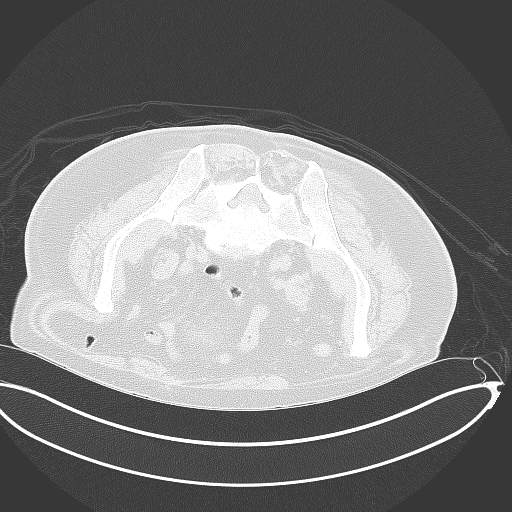
[im 17/31  mediastinal]
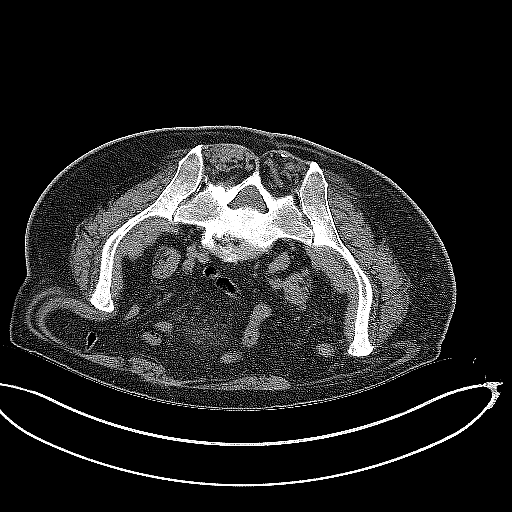
[im 17/31  lung]
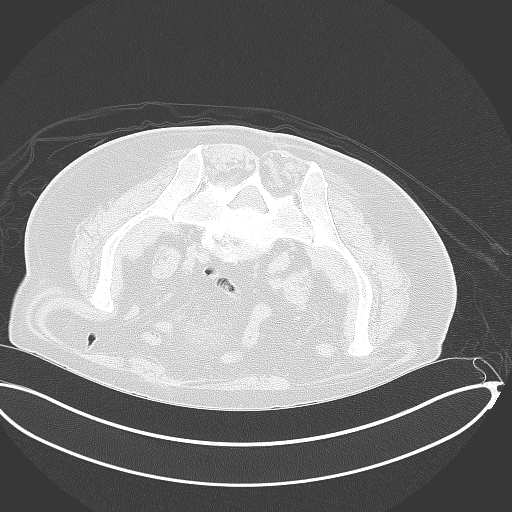
[im 19/31  lung]
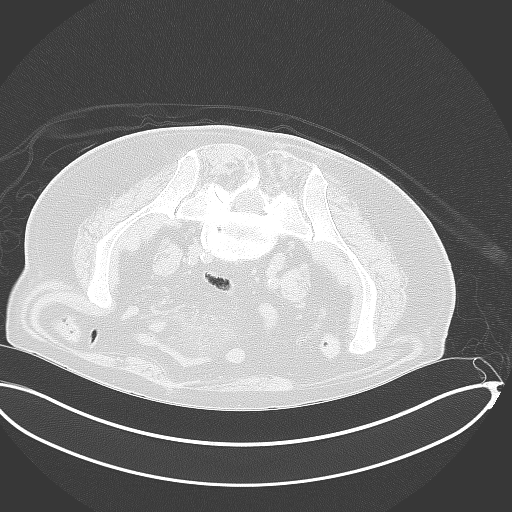
[im 21/31  lung]
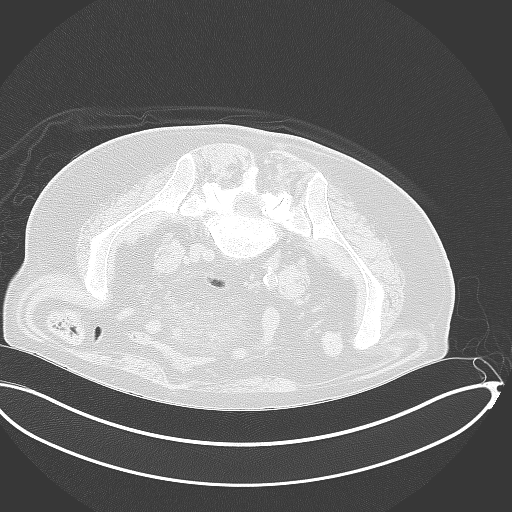
[im 24/31  lung]
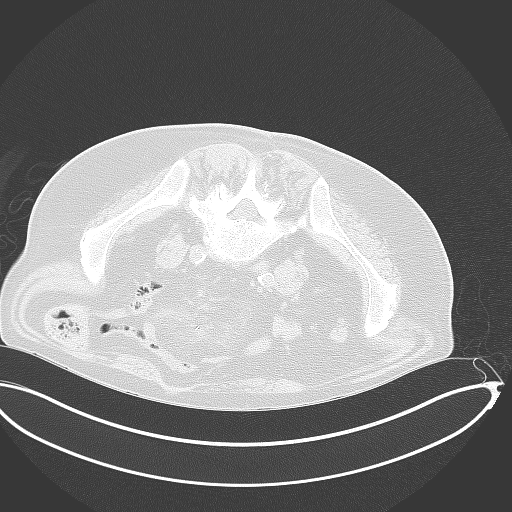
[im 26/31  mediastinal]
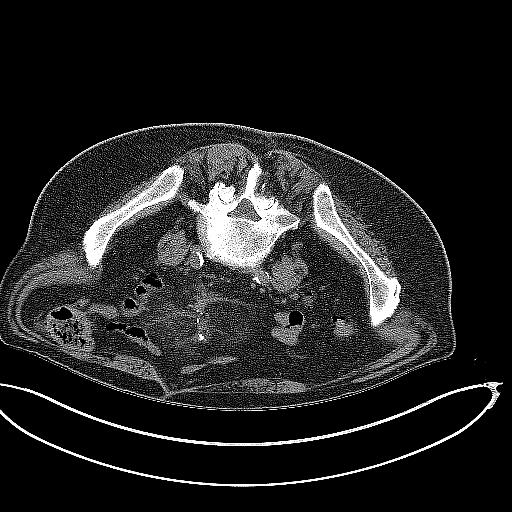
[im 26/31  lung]
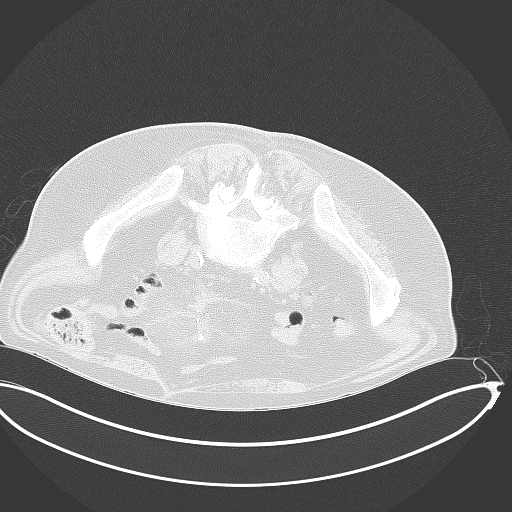
[im 27/31  lung]
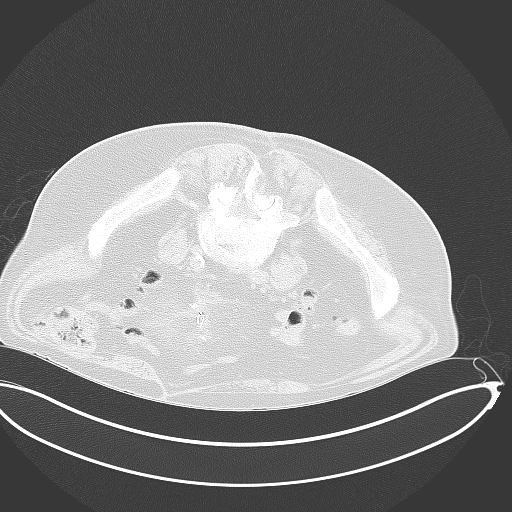
[im 29/31  lung]
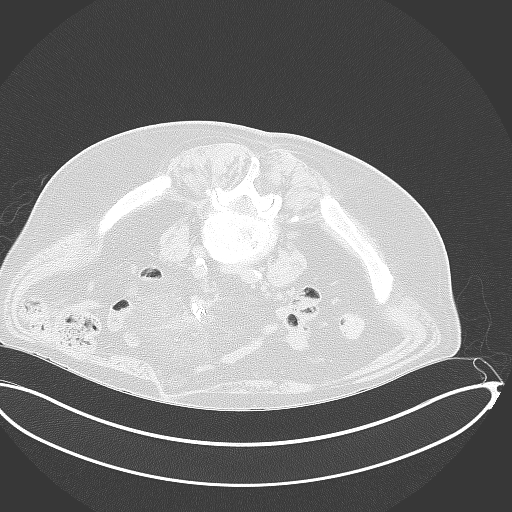

[15 of 28 positions shown; findings below may reference images not displayed]

EXAM:
CT BIOPSY

MEDICATIONS:
None.

ANESTHESIA/SEDATION:
Fentanyl 50 mcg IV; Versed 2 mg IV

Moderate Sedation Time:  14

The patient was continuously monitored during the procedure by the
interventional radiology nurse under my direct supervision.

FLUOROSCOPY TIME:  Fluoroscopy Time:  minutes  seconds ( mGy).

COMPLICATIONS:
None immediate.

PROCEDURE:
Informed written consent was obtained from the patient after a
thorough discussion of the procedural risks, benefits and
alternatives. All questions were addressed. Maximal Sterile Barrier
Technique was utilized including caps, mask, sterile gowns, sterile
gloves, sterile drape, hand hygiene and skin antiseptic. A timeout
was performed prior to the initiation of the procedure.

Under CT guidance, a(n) 11 gauge guide needle was advanced into the
right iliac bone via posterior approach. Aspirates and a core were
obtain. Post biopsy images demonstrate no hemorrhage.

Patient tolerated the procedure well without complication. Vital
sign monitoring by nursing staff during the procedure will continue
as patient is in the special procedures unit for post procedure
observation.
FINDINGS: The images document guide needle placement within the right iliac
bone. Post biopsy images demonstrate no hemorrhage.
IMPRESSION: Successful CT-guided bone marrow aspirate and core from the right
iliac bone.

## 2018-05-01 DIAGNOSIS — N183 Chronic kidney disease, stage 3 (moderate): Secondary | ICD-10-CM | POA: Diagnosis not present

## 2018-05-01 DIAGNOSIS — I25118 Atherosclerotic heart disease of native coronary artery with other forms of angina pectoris: Secondary | ICD-10-CM | POA: Diagnosis not present

## 2018-05-01 DIAGNOSIS — I255 Ischemic cardiomyopathy: Secondary | ICD-10-CM | POA: Diagnosis not present

## 2018-05-01 DIAGNOSIS — I10 Essential (primary) hypertension: Secondary | ICD-10-CM | POA: Diagnosis not present

## 2018-05-01 DIAGNOSIS — I208 Other forms of angina pectoris: Secondary | ICD-10-CM | POA: Diagnosis not present

## 2018-05-01 DIAGNOSIS — I5022 Chronic systolic (congestive) heart failure: Secondary | ICD-10-CM | POA: Diagnosis not present

## 2018-05-01 DIAGNOSIS — E782 Mixed hyperlipidemia: Secondary | ICD-10-CM | POA: Diagnosis not present

## 2018-05-08 DIAGNOSIS — D472 Monoclonal gammopathy: Secondary | ICD-10-CM | POA: Diagnosis not present

## 2018-05-08 DIAGNOSIS — L989 Disorder of the skin and subcutaneous tissue, unspecified: Secondary | ICD-10-CM | POA: Diagnosis not present

## 2018-05-08 DIAGNOSIS — C9200 Acute myeloblastic leukemia, not having achieved remission: Secondary | ICD-10-CM | POA: Diagnosis not present

## 2018-05-16 DIAGNOSIS — B351 Tinea unguium: Secondary | ICD-10-CM | POA: Diagnosis not present

## 2018-05-16 DIAGNOSIS — M79676 Pain in unspecified toe(s): Secondary | ICD-10-CM | POA: Diagnosis not present

## 2018-05-24 DIAGNOSIS — L570 Actinic keratosis: Secondary | ICD-10-CM | POA: Diagnosis not present

## 2018-05-24 DIAGNOSIS — D489 Neoplasm of uncertain behavior, unspecified: Secondary | ICD-10-CM | POA: Diagnosis not present

## 2018-05-24 DIAGNOSIS — C44229 Squamous cell carcinoma of skin of left ear and external auricular canal: Secondary | ICD-10-CM | POA: Diagnosis not present

## 2018-05-30 DIAGNOSIS — K429 Umbilical hernia without obstruction or gangrene: Secondary | ICD-10-CM | POA: Diagnosis not present

## 2018-05-30 DIAGNOSIS — R972 Elevated prostate specific antigen [PSA]: Secondary | ICD-10-CM | POA: Diagnosis not present

## 2018-05-30 DIAGNOSIS — Z6825 Body mass index (BMI) 25.0-25.9, adult: Secondary | ICD-10-CM | POA: Diagnosis not present

## 2018-05-30 DIAGNOSIS — N183 Chronic kidney disease, stage 3 (moderate): Secondary | ICD-10-CM | POA: Diagnosis not present

## 2018-05-30 DIAGNOSIS — E1129 Type 2 diabetes mellitus with other diabetic kidney complication: Secondary | ICD-10-CM | POA: Diagnosis not present

## 2018-05-30 DIAGNOSIS — I2510 Atherosclerotic heart disease of native coronary artery without angina pectoris: Secondary | ICD-10-CM | POA: Diagnosis not present

## 2018-05-30 DIAGNOSIS — Z1389 Encounter for screening for other disorder: Secondary | ICD-10-CM | POA: Diagnosis not present

## 2018-05-30 DIAGNOSIS — Z0000 Encounter for general adult medical examination without abnormal findings: Secondary | ICD-10-CM | POA: Diagnosis not present

## 2018-05-30 DIAGNOSIS — J309 Allergic rhinitis, unspecified: Secondary | ICD-10-CM | POA: Diagnosis not present

## 2018-05-30 DIAGNOSIS — E039 Hypothyroidism, unspecified: Secondary | ICD-10-CM | POA: Diagnosis not present

## 2018-05-30 DIAGNOSIS — L109 Pemphigus, unspecified: Secondary | ICD-10-CM | POA: Diagnosis not present

## 2018-05-30 DIAGNOSIS — K529 Noninfective gastroenteritis and colitis, unspecified: Secondary | ICD-10-CM | POA: Diagnosis not present

## 2018-05-30 DIAGNOSIS — E663 Overweight: Secondary | ICD-10-CM | POA: Diagnosis not present

## 2018-05-30 DIAGNOSIS — D61818 Other pancytopenia: Secondary | ICD-10-CM | POA: Diagnosis not present

## 2018-05-30 DIAGNOSIS — R5383 Other fatigue: Secondary | ICD-10-CM | POA: Diagnosis not present

## 2018-05-30 DIAGNOSIS — E119 Type 2 diabetes mellitus without complications: Secondary | ICD-10-CM | POA: Diagnosis not present

## 2018-05-30 DIAGNOSIS — Z0001 Encounter for general adult medical examination with abnormal findings: Secondary | ICD-10-CM | POA: Diagnosis not present

## 2018-05-30 DIAGNOSIS — E782 Mixed hyperlipidemia: Secondary | ICD-10-CM | POA: Diagnosis not present

## 2018-07-03 DIAGNOSIS — C44229 Squamous cell carcinoma of skin of left ear and external auricular canal: Secondary | ICD-10-CM | POA: Diagnosis not present

## 2018-07-03 DIAGNOSIS — L7211 Pilar cyst: Secondary | ICD-10-CM | POA: Diagnosis not present

## 2018-07-17 DIAGNOSIS — M8588 Other specified disorders of bone density and structure, other site: Secondary | ICD-10-CM | POA: Diagnosis not present

## 2018-07-17 DIAGNOSIS — G8929 Other chronic pain: Secondary | ICD-10-CM | POA: Diagnosis not present

## 2018-07-17 DIAGNOSIS — C9200 Acute myeloblastic leukemia, not having achieved remission: Secondary | ICD-10-CM | POA: Diagnosis not present

## 2018-07-17 DIAGNOSIS — R102 Pelvic and perineal pain: Secondary | ICD-10-CM | POA: Diagnosis not present

## 2018-07-17 DIAGNOSIS — M47896 Other spondylosis, lumbar region: Secondary | ICD-10-CM | POA: Diagnosis not present

## 2018-07-17 DIAGNOSIS — M545 Low back pain: Secondary | ICD-10-CM | POA: Diagnosis not present

## 2018-07-18 ENCOUNTER — Encounter (INDEPENDENT_AMBULATORY_CARE_PROVIDER_SITE_OTHER): Payer: Self-pay | Admitting: Internal Medicine

## 2018-07-18 ENCOUNTER — Ambulatory Visit (INDEPENDENT_AMBULATORY_CARE_PROVIDER_SITE_OTHER): Payer: Medicare Other | Admitting: Internal Medicine

## 2018-07-18 VITALS — BP 120/70 | HR 60 | Temp 96.4°F | Resp 18 | Ht 74.0 in | Wt 189.0 lb

## 2018-07-18 DIAGNOSIS — K219 Gastro-esophageal reflux disease without esophagitis: Secondary | ICD-10-CM

## 2018-07-18 DIAGNOSIS — K588 Other irritable bowel syndrome: Secondary | ICD-10-CM | POA: Diagnosis not present

## 2018-07-18 NOTE — Progress Notes (Signed)
Presenting complaint;  Follow-up for chronic GERD. History of esophageal motility disorder.  Database and subjective:  Patient is 84 year old Caucasian male was chronic GERD and history of esophageal motility disorder based on barium studies a few years ago who is here for scheduled visit.  He was last seen 1 year ago.  He states he is doing well.  He is more worried about his wife who is continue to lose weight.  He states he may have heartburn once in a while relieved with Mylanta and/or Tums.  He did have an episode which woke him up at night few weeks ago.  He was not sure what was going on and he took nitroglycerin which helped.  He denies dysphagia hoarseness chronic cough or sore throat.  He has good appetite.  He states his bowels are not doing well.  He has had 2 accidents in the last 1 month.  Every now and then he has urgency and is not able to make it to the bathroom.  He said this problem started sometime after his last visit 1 year ago and he does recall that he had one similar episode 31 years ago just before he retired.  He took Imodium then.  He denies melena or rectal bleeding. He remains with back pain.  He states he has no difficulty getting up from supine position but if he sitting in a chair he has to use his upper extremities so that he could stand up.  He feels he is developing muscle weakness involving proximal lower extremity muscles.  Current Medications: Outpatient Encounter Medications as of 07/18/2018  Medication Sig  . acetaminophen (TYLENOL) 500 MG tablet Take 500 mg by mouth every 6 (six) hours as needed for moderate pain.  Marland Kitchen albuterol-ipratropium (COMBIVENT) 18-103 MCG/ACT inhaler Inhale 2-4 puffs into the lungs 2 (two) times daily as needed for wheezing.   Marland Kitchen allopurinol (ZYLOPRIM) 100 MG tablet Take 1 tablet (100 mg total) by mouth 2 (two) times daily.  . carvedilol (COREG) 6.25 MG tablet Take 6.25 mg by mouth 2 (two) times daily with a meal.    . Cholecalciferol  (VITAMIN D3) 5000 UNITS CAPS Take 5,000 Units by mouth daily.   . fluticasone (FLONASE) 50 MCG/ACT nasal spray Place 2 sprays into both nostrils at bedtime as needed for allergies.   . furosemide (LASIX) 40 MG tablet Take 20 mg by mouth daily.   Marland Kitchen ipratropium (ATROVENT) 0.03 % nasal spray Place 2 sprays into both nostrils 3 (three) times daily.   Marland Kitchen levothyroxine (SYNTHROID, LEVOTHROID) 125 MCG tablet Take 125 mcg by mouth daily.   . montelukast (SINGULAIR) 10 MG tablet Take 10 mg by mouth at bedtime.   . nitroGLYCERIN (NITRODUR - DOSED IN MG/24 HR) 0.6 mg/hr patch Place 0.6 mg onto the skin daily.  . Omega-3 Fatty Acids (FISH OIL) 1000 MG CPDR Take 1,000 mg by mouth daily.  . pantoprazole (PROTONIX) 40 MG tablet Take 1 tablet (40 mg total) by mouth daily before breakfast.  . pravastatin (PRAVACHOL) 20 MG tablet Take 20 mg by mouth every evening.  . tamsulosin (FLOMAX) 0.4 MG CAPS capsule Take 0.4 mg by mouth daily.  Marland Kitchen tiotropium (SPIRIVA) 18 MCG inhalation capsule Place 18 mcg into inhaler and inhale daily.  . vitamin B-12 (CYANOCOBALAMIN) 1000 MCG tablet Take 1,000 mcg by mouth daily.     No facility-administered encounter medications on file as of 07/18/2018.      Objective: Blood pressure 120/70, pulse 60, temperature (!) 96.4 F (  35.8 C), temperature source Oral, resp. rate 18, height 6\' 2"  (1.88 m), weight 189 lb (85.7 kg). Patient is alert and in no acute distress. Conjunctiva is pink. Sclera is nonicteric Oropharyngeal mucosa is normal. No neck masses or thyromegaly noted. Cardiac exam with regular rhythm normal S1 and S2. No murmur or gallop noted. Lungs are clear to auscultation. Abdomen.  He has small umbilical hernia which is completely reducible.  He has lower midline scar.  Abdomen is soft and nontender with organomegaly or masses. No LE edema or clubbing noted.   Assessment:  #1.  Chronic GERD.  He is doing well with therapy.  Note plans to change his PPI at this  time.  #2.  Irritable bowel syndrome.  His symptoms are typical.  He does not have any alarm symptoms.  Therefore will hold off further evaluation and treat him symptomatically.   Plan:  Continue pantoprazole at current dose of 40 mg p.o. every morning. Imodium OTC 2 mg p.o. daily PRN. He can use glycerin or Dulcolax suppository when he has sense of incomplete evacuation. Patient will call if stool urgency worsens. Office visit in 1 year.

## 2018-07-18 NOTE — Patient Instructions (Signed)
Call if pantoprazole stops working. Can take Imodium OTC 2 mg daily on as-needed basis. Can use Dulcolax or glycerin suppository when you feel like you have incomplete evacuation.

## 2018-07-21 DIAGNOSIS — Z23 Encounter for immunization: Secondary | ICD-10-CM | POA: Diagnosis not present

## 2018-07-25 ENCOUNTER — Ambulatory Visit (INDEPENDENT_AMBULATORY_CARE_PROVIDER_SITE_OTHER): Payer: Medicare Other | Admitting: Internal Medicine

## 2018-08-22 DIAGNOSIS — B351 Tinea unguium: Secondary | ICD-10-CM | POA: Diagnosis not present

## 2018-08-22 DIAGNOSIS — M79676 Pain in unspecified toe(s): Secondary | ICD-10-CM | POA: Diagnosis not present

## 2018-10-16 DIAGNOSIS — G8929 Other chronic pain: Secondary | ICD-10-CM | POA: Diagnosis not present

## 2018-10-16 DIAGNOSIS — D472 Monoclonal gammopathy: Secondary | ICD-10-CM | POA: Diagnosis not present

## 2018-10-16 DIAGNOSIS — M545 Low back pain: Secondary | ICD-10-CM | POA: Diagnosis not present

## 2018-10-16 DIAGNOSIS — D72819 Decreased white blood cell count, unspecified: Secondary | ICD-10-CM | POA: Diagnosis not present

## 2018-10-16 DIAGNOSIS — D696 Thrombocytopenia, unspecified: Secondary | ICD-10-CM | POA: Diagnosis not present

## 2018-10-16 DIAGNOSIS — C9200 Acute myeloblastic leukemia, not having achieved remission: Secondary | ICD-10-CM | POA: Diagnosis not present

## 2018-10-20 ENCOUNTER — Telehealth (HOSPITAL_COMMUNITY): Payer: Self-pay | Admitting: Physical Therapy

## 2018-10-20 NOTE — Telephone Encounter (Signed)
Son called to check appt time

## 2018-10-23 ENCOUNTER — Ambulatory Visit (HOSPITAL_COMMUNITY): Payer: Medicare Other | Attending: Oncology | Admitting: Physical Therapy

## 2018-10-23 ENCOUNTER — Encounter (HOSPITAL_COMMUNITY): Payer: Self-pay | Admitting: Physical Therapy

## 2018-10-23 ENCOUNTER — Other Ambulatory Visit: Payer: Self-pay

## 2018-10-23 DIAGNOSIS — R2681 Unsteadiness on feet: Secondary | ICD-10-CM | POA: Insufficient documentation

## 2018-10-23 DIAGNOSIS — M545 Low back pain, unspecified: Secondary | ICD-10-CM

## 2018-10-23 DIAGNOSIS — R262 Difficulty in walking, not elsewhere classified: Secondary | ICD-10-CM

## 2018-10-23 NOTE — Therapy (Signed)
[facility] [place], [place], [address] Phone: [phone]   Fax:  [phone]  Physical Therapy Evaluation  Patient Details  Name: Brian Mcmillan MRN: [id number] Date of Birth: 1925-03-15 Referring Provider (PT): Everardo All   Encounter Date: 10/23/2018  PT End of Session - 10/23/18 1302    Visit Number  1    Number of Visits  18    Date for PT Re-Evaluation  12/04/18    PT Start Time  1030    PT Stop Time  1115    PT Time Calculation (min)  45 min    Activity Tolerance  Patient tolerated treatment well       Past Medical History:  Diagnosis Date  . Abdominal mass 2008   chronic inflammatory mass of the mesentary Dr [name]  . 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 Anemia   . Anginal pain ([place])   . CAD (coronary artery disease)    cath 09/23/09 70% Dx1 (diffuse disease), 40% Cx, Large RCA 80-90% with heavy calcification / cath 06/2010 no change tiht RCA still best to treat medially with nosebleeds  . Chest pain    myoview 2006 no ischemia/ myoview 2009 no ischemia  . CHF (congestive heart failure) ([place])   . CKD (chronic kidney disease)   . COPD (chronic obstructive pulmonary disease) ([place])   . Dyslipidemia   . GERD (gastroesophageal reflux disease)   . Gout   . History of bleeding ulcers    "had 13 back in the early 1980s" (09/28/2016)  . History of blood transfusion 09/28/2016  . Hypertension    no RAS  . Hypothyroidism   . Kidney mass 2008   radiofrequency ablation at [facility]  . Left ventricular dysfunction    myoview 2006 normal / myoview 2009 normal / EF 60% cath 09/2009, EF 60% cath 06/2010  . Memory impairment    memory decreasing 06/2010  . Myocardial infarction ([place]) <2016X 1; 2016   "@ [facility]; @ [facility]"  . Nosebleed "frequently since ~ 1966"   significant, can not take ASA  . Pneumonia 08/2016-09/2016  . Rash Sept 2011   rash on buttocks, hospitalized hydralazine stopped but  then restarted w/o return of rash  . Renal cancer ([place])   . Skin cancer of scalp   . Systolic click    no mitral valve prolapse  . Type II diabetes mellitus ([place])     Past Surgical History:  Procedure Laterality Date  . BACK SURGERY    . BONE MARROW ASPIRATION     X2  . BONE MARROW BIOPSY     X2  . CARDIAC CATHETERIZATION    . CATARACT EXTRACTION W/ INTRAOCULAR LENS  IMPLANT, BILATERAL Bilateral   . COLONOSCOPY N/A 03/07/2013   Procedure: COLONOSCOPY;  Surgeon: [name], MD;  Location: [facility];  Service: Endoscopy;  Laterality: N/A;  830-moved to 25 Ann to notify pt  . COLONOSCOPY  01/2013  . LUMBAR DISC SURGERY     "L4-5"  . LYMPH NODE DISSECTION Right     abdomen  . LYMPH NODE DISSECTION Left    robotic laser @ [facility]  on left side of stomach  . MOHS SURGERY     scalp; "22 stitches around; 9 stitches down; went all the way to the skull"  . NASAL SEPTUM SURGERY    . RFA RIGHT KIDNEY    . TONSILLECTOMY  1933    There were no vitals  filed for this visit.   Subjective Assessment - 10/23/18 1030    Subjective  [name] states that he has noted that he is having increased back pain which eventually has caused  weakness in his legs.  He is having difficulty moving in and out of bed and needs to use his hands to get up from a chair.  He mentioned this to his MD who has referred him to therapy.     Pertinent History  TIA, CKD, CAD, HTN, Hx of renal cancer.     Limitations  Standing;Walking;Other (comment)    How long can you sit comfortably?  no problem     How long can you stand comfortably?  five-ten  minutes     How long can you walk comfortably?  PT walks with a quad cane normally in the home only,     Patient Stated Goals  To be able to walk safer, and move in and out of bed easier.     Currently in Pain?  Yes    Pain Score  7     Pain Location  Back    Pain Orientation  Lower;Mid;Right;Left   mainly left    Pain Descriptors / Indicators  Aching;Dull    Pain  Type  Chronic pain    Pain Onset  1 to 4 weeks ago    Pain Frequency  Constant    Aggravating Factors   walking     Pain Relieving Factors  lying on his back     Effect of Pain on Daily Activities  limits          [facility] PT Assessment - 10/23/18 0001      Assessment   Medical Diagnosis  Back pain with unsteadiness on feet    Referring Provider (PT)  Everardo All    Onset Date/Surgical Date  07/11/18    Next MD Visit  11/02/2018    Prior Therapy  none      Precautions   Precautions  Fall      Restrictions   Weight Bearing Restrictions  No      Balance Screen   Has the patient fallen in the past 6 months  No    Has the patient had a decrease in activity level because of a fear of falling?   Yes    Is the patient reluctant to leave their home because of a fear of falling?   No      Home Film/video editor residence      Prior Function   Level of Independence  Independent with community mobility without device      Cognition   Overall Cognitive Status  Within Functional Limits for tasks assessed      Observation/Other Assessments   Focus on Therapeutic Outcomes (FOTO)   38      Functional Tests   Functional tests  Single leg stance;Sit to Stand      Single Leg Stance   Comments  unable       Sit to Stand   Comments  needs to use hands to stand up        ROM / Strength   AROM / PROM / Strength  Strength      Strength   Strength Assessment Site  Hip;Knee;Ankle    Right/Left Hip  Right;Left    Right Hip Flexion  3/5    Right Hip Extension  3-/5    Left Hip Flexion  3/5    Left Hip Extension  3-/5    Right/Left Knee  Right;Left    Right Knee Extension  4/5    Left Knee Extension  3+/5    Right/Left Ankle  Right;Left    Right Ankle Dorsiflexion  3+/5    Left Ankle Dorsiflexion  3+/5      Ambulation/Gait   Ambulation Distance (Feet)  234 Feet    Assistive device  Small based quad cane    Gait Pattern  Decreased step length -  right;Decreased step length - left;Decreased stance time - right    Gait Comments  3MWT                Objective measurements completed on examination: See above findings.      [facility] Adult PT Treatment/Exercise - 10/23/18 0001      Exercises   Exercises  Lumbar      Lumbar Exercises: Stretches   Other Lumbar Stretch Exercise  decompression 1-2      Lumbar Exercises: Seated   Other Seated Lumbar Exercises  sitting as tall as possible x 10 seconds x5; scapular retraction x 10              PT Education - 10/23/18 1300    Education Details  The importance of having good posture, bed mobility training and exercises    Person(s) Educated  Patient    Methods  Explanation;Handout    Comprehension  Verbalized understanding;Returned demonstration;Need further instruction       PT Short Term Goals - 10/23/18 1619      PT SHORT TERM GOAL #1   Title  Pt to be able to get in and out of bed with ease.     Time  3    Period  Weeks    Status  New    Target Date  11/13/18      PT SHORT TERM GOAL #2   Title  PT LE strength to be increased 1/2 grade to allow pt to come sit to stand with UE assist with increased ease.     Time  3    Period  Weeks    Status  New      PT SHORT TERM GOAL #3   Title  PT back pain to decrease to no greater than a 4/10 to allow pt to walk with his quad cane for 5 minutes without increased pain.     Time  3    Period  Weeks    Status  New      PT SHORT TERM GOAL #4   Title  PT to be able to single leg stance on both LE for 5 seconds to decrease risk of falling.     Time  3    Period  Weeks    Status  New        PT Long Term Goals - 10/23/18 1621      PT LONG TERM GOAL #1   Title  PT LE strength to be increased by one grade to allow pt to come sit to stand without having to use his UE>     Time  6    Period  Weeks    Status  New    Target Date  12/04/18      PT LONG TERM GOAL #2   Title  PT back pain to be no greater than a  2/10 to allow pt to walk with his cane painfree for ten  minutes to be able to complete household tasks.     Time  6    Period  Weeks    Status  New      PT LONG TERM GOAL #3   Title  PT ROM of hip and back to improve to allow pt to be able to don shoes and socks without pain     Time  6    Period  Weeks    Status  New      PT LONG TERM GOAL #4   Title  PT to be able to single leg balance on both LE for 15 seconds to allow pt to feel confident walking on uneven ground.     Time  6    Period  Weeks    Status  New             Plan - 10/23/18 1613    Clinical Impression Statement  Mr. Pflaum is a 85 yo male who has been referred to skilled physical therapy for Radicular back pain.  Evaluation demonstrates decreased activity level, decreased balance, decreased mobility, decreased LE strenght, impaired flexibility and postrual dysfunction.  Mr. Jovel will benefit from skilled physical therapy to address these issues and maximize his physical ability.      History and Personal Factors relevant to plan of care:  remote hx of renal cancer, CAD, TIA, HTN, CHKD     Clinical Presentation  Stable    Clinical Decision Making  Moderate    Rehab Potential  Good    PT Frequency  3x / week    PT Duration  6 weeks    PT Treatment/Interventions  ADLs/Self Care Home Management;Patient/family education;Therapeutic exercise;Therapeutic activities;Balance training;Functional mobility training;Stair training;Gait training;DME Instruction;Manual techniques    PT Next Visit Plan  Review eval and goals, Add cervical retraction,  decompression exercises 4-5; begin bed mobility and supine stabilization exercises; knee to chest     PT Home Exercise Plan  sitting tall with scapular retraction, decompression 1-3    Consulted and Agree with Plan of Care  Patient       Patient will benefit from skilled therapeutic intervention in order to improve the following deficits and impairments:  Abnormal gait, Decreased  activity tolerance, Decreased balance, Decreased mobility, Decreased strength, Decreased range of motion, Difficulty walking, Postural dysfunction, Pain, Improper body mechanics  Visit Diagnosis: Acute bilateral low back pain without sciatica - Plan: PT plan of care cert/re-cert  Difficulty in walking, not elsewhere classified - Plan: PT plan of care cert/re-cert  Unsteadiness on feet - Plan: PT plan of care cert/re-cert     Problem List Patient Active Problem List   Diagnosis Date Noted  . Goals of care, counseling/discussion   . Acute blood loss anemia 09/28/2016  . CAD (coronary artery disease)   . Pancytopenia ([place])   . Epistaxis, recurrent   . Hyperuricemia   . Acute myeloid leukemia in adult [facility])   . AKI (acute kidney injury) ([place])   . Troponin level elevated   . Leukocytosis   . Lobar pneumonia, unspecified organism ([place])   . Acute on chronic kidney failure ([place]) 09/04/2016  . Acute on chronic systolic CHF (congestive heart failure) ([place]) 09/04/2016  . Thrombocytopenia ([place]) 09/04/2016  . Cellulitis 09/04/2016  . Chronic combined systolic and diastolic CHF (congestive heart failure) ([place]) 09/04/2016  . Paroxysmal atrial fibrillation ([facility])   . Cricopharyngeal dysphagia 11/06/2015  . Esophageal dysphagia 11/06/2015  . Carotid disease, bilateral ([place]) 12/05/2014  .  TIA (transient ischemic attack) 12/04/2014  . MGUS (monoclonal gammopathy of unknown significance) 03/29/2011  . Renal cancer ([place]) 03/29/2011    Class: Diagnosis of  . Shortness of breath 12/24/2010  . EDEMA 11/17/2010  . HEADACHE 10/28/2009  . Coronary artery disease due to lipid rich plaque 09/25/2009  . NOSEBLEED 09/22/2009  . Hypothyroidism, acquired 06/29/2009  . DYSLIPIDEMIA 06/29/2009  . Essential hypertension 06/29/2009  . CHEST PAIN 06/29/2009    Rayetta Humphrey, PT CLT [phone] 10/23/2018, 4:27 PM  [facility] [address] [place],  [place], [address] Phone: [phone]   Fax:  [phone]  Name: Brian Mcmillan MRN: [id number] Date of Birth: 1925-09-23

## 2018-10-23 NOTE — Patient Instructions (Addendum)
Scapular Retraction (Standing)    With arms at sides, pinch shoulder blades together. Repeat _10___ times per set. Do _1___ sets per session. Do _3___ sessions per day.  http://orth.exer.us/944   Copyright  VHI. All rights reserved.   

## 2018-10-25 ENCOUNTER — Ambulatory Visit (HOSPITAL_COMMUNITY): Payer: Medicare Other

## 2018-10-25 ENCOUNTER — Encounter (HOSPITAL_COMMUNITY): Payer: Self-pay

## 2018-10-25 DIAGNOSIS — R2681 Unsteadiness on feet: Secondary | ICD-10-CM | POA: Diagnosis not present

## 2018-10-25 DIAGNOSIS — M545 Low back pain, unspecified: Secondary | ICD-10-CM

## 2018-10-25 DIAGNOSIS — R262 Difficulty in walking, not elsewhere classified: Secondary | ICD-10-CM | POA: Diagnosis not present

## 2018-10-25 NOTE — Therapy (Signed)
[facility] [address] [place], [place], [address] Phone: [phone]   Fax:  [phone]  Physical Therapy Treatment  Patient Details  Name: Brian Mcmillan MRN: [id number] Date of Birth: 10/10/1925 Referring Provider (PT): Everardo All   Encounter Date: 10/25/2018  PT End of Session - 10/25/18 1307    Visit Number  2    Number of Visits  18    Date for PT Re-Evaluation  12/04/18    Authorization Type  Medicare A & B    Authorization Time Period  1/13-->12/04/18    PT Start Time  1301    PT Stop Time  1348    PT Time Calculation (min)  47 min    Activity Tolerance  Patient tolerated treatment well    Behavior During Therapy  [facility] for tasks assessed/performed       Past Medical History:  Diagnosis Date  . Abdominal mass 2008   chronic inflammatory mass of the mesentary Dr [name]  . 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 Anemia   . Anginal pain ([place])   . CAD (coronary artery disease)    cath 09/23/09 70% Dx1 (diffuse disease), 40% Cx, Large RCA 80-90% with heavy calcification / cath 06/2010 no change tiht RCA still best to treat medially with nosebleeds  . Chest pain    myoview 2006 no ischemia/ myoview 2009 no ischemia  . CHF (congestive heart failure) ([place])   . CKD (chronic kidney disease)   . COPD (chronic obstructive pulmonary disease) ([place])   . Dyslipidemia   . GERD (gastroesophageal reflux disease)   . Gout   . History of bleeding ulcers    "had 13 back in the early 1980s" (09/28/2016)  . History of blood transfusion 09/28/2016  . Hypertension    no RAS  . Hypothyroidism   . Kidney mass 2008   radiofrequency ablation at [facility]  . Left ventricular dysfunction    myoview 2006 normal / myoview 2009 normal / EF 60% cath 09/2009, EF 60% cath 06/2010  . Memory impairment    memory decreasing 06/2010  . Myocardial infarction ([place]) <2016X 1; 2016   "@ [facility]; @ [facility]"  . Nosebleed "frequently since ~  1966"   significant, can not take ASA  . Pneumonia 08/2016-09/2016  . Rash Sept 2011   rash on buttocks, hospitalized hydralazine stopped but then restarted w/o return of rash  . Renal cancer ([place])   . Skin cancer of scalp   . Systolic click    no mitral valve prolapse  . Type II diabetes mellitus ([place])     Past Surgical History:  Procedure Laterality Date  . BACK SURGERY    . BONE MARROW ASPIRATION     X2  . BONE MARROW BIOPSY     X2  . CARDIAC CATHETERIZATION    . CATARACT EXTRACTION W/ INTRAOCULAR LENS  IMPLANT, BILATERAL Bilateral   . COLONOSCOPY N/A 03/07/2013   Procedure: COLONOSCOPY;  Surgeon: [name], MD;  [facility];  Service: Endoscopy;  Laterality: N/A;  830-moved to 95 Ann to notify pt  . COLONOSCOPY  01/2013  . LUMBAR DISC SURGERY     "L4-5"  . LYMPH NODE DISSECTION Right     abdomen  . LYMPH NODE DISSECTION Left    robotic laser @ [facility]  on left side of stomach  . MOHS SURGERY     scalp; "22 stitches around; 9 stitches down; went all the way  to the skull"  . NASAL SEPTUM SURGERY    . RFA RIGHT KIDNEY    . TONSILLECTOMY  1933    There were no vitals filed for this visit.  Subjective Assessment - 10/25/18 1306    Subjective  Pt stated he is feeling good today, no reports of pain today.  Reports he has began HEP    Pertinent History  TIA, CKD, CAD, HTN, Hx of renal cancer.     Patient Stated Goals  To be able to walk safer, and move in and out of bed easier.     Currently in Pain?  No/denies                       [facility] Adult PT Treatment/Exercise - 10/25/18 0001      Bed Mobility   Bed Mobility  Sit to Sidelying Left    Sit to Sidelying Left  Minimal Assistance - Patient > 75%;Contact Guard/Touching assist   log rolling, step by step instructions x 3 reps     Lumbar Exercises: Seated   Other Seated Lumbar Exercises  sitting as tall as possible x 10 seconds x5; scapular retraction x 10       Lumbar Exercises: Supine    Other Supine Lumbar Exercises  Decompression 1-5 5x each             PT Education - 10/25/18 1336    Education Details  Reviewed goals and reviewed mechanics iwth HEP.  Educated importance of good posture and log rolling for bed mobility.      Methods  Explanation    Comprehension  Verbalized understanding;Returned demonstration;Need further instruction;Verbal cues required;Tactile cues required       PT Short Term Goals - 10/23/18 1619      PT SHORT TERM GOAL #1   Title  Pt to be able to get in and out of bed with ease.     Time  3    Period  Weeks    Status  New    Target Date  11/13/18      PT SHORT TERM GOAL #2   Title  PT LE strength to be increased 1/2 grade to allow pt to come sit to stand with UE assist with increased ease.     Time  3    Period  Weeks    Status  New      PT SHORT TERM GOAL #3   Title  PT back pain to decrease to no greater than a 4/10 to allow pt to walk with his quad cane for 5 minutes without increased pain.     Time  3    Period  Weeks    Status  New      PT SHORT TERM GOAL #4   Title  PT to be able to single leg stance on both LE for 5 seconds to decrease risk of falling.     Time  3    Period  Weeks    Status  New        PT Long Term Goals - 10/23/18 1621      PT LONG TERM GOAL #1   Title  PT LE strength to be increased by one grade to allow pt to come sit to stand without having to use his UE>     Time  6    Period  Weeks    Status  New    Target Date  12/04/18  PT LONG TERM GOAL #2   Title  PT back pain to be no greater than a 2/10 to allow pt to walk with his cane painfree for ten minutes to be able to complete household tasks.     Time  6    Period  Weeks    Status  New      PT LONG TERM GOAL #3   Title  PT ROM of hip and back to improve to allow pt to be able to don shoes and socks without pain     Time  6    Period  Weeks    Status  New      PT LONG TERM GOAL #4   Title  PT to be able to single leg  balance on both LE for 15 seconds to allow pt to feel confident walking on uneven ground.     Time  6    Period  Weeks    Status  New            Plan - 10/25/18 1339    Clinical Impression Statement  Reviewed goals.  Begans session educating bed mobility with log rolling.  Educated importance of posture and therex focus with postural strengthening.  Pt required frequent cueing to stay on task through sessoin and moderate verbal and tactile cueing to complete correct mechanics through session.  No reports of increased pain through session.       Rehab Potential  Good    PT Frequency  3x / week    PT Duration  6 weeks    PT Treatment/Interventions  ADLs/Self Care Home Management;Patient/family education;Therapeutic exercise;Therapeutic activities;Balance training;Functional mobility training;Stair training;Gait training;DME Instruction;Manual techniques    PT Next Visit Plan  Review compliance with HEP.   Add cervical retraction and SKTC  decompression exercises 4-5; begin bed mobility and supine stabilization exercises.    PT Home Exercise Plan  sitting tall with scapular retraction, decompression 1-3       Patient will benefit from skilled therapeutic intervention in order to improve the following deficits and impairments:  Abnormal gait, Decreased activity tolerance, Decreased balance, Decreased mobility, Decreased strength, Decreased range of motion, Difficulty walking, Postural dysfunction, Pain, Improper body mechanics  Visit Diagnosis: Acute bilateral low back pain without sciatica  Difficulty in walking, not elsewhere classified  Unsteadiness on feet     Problem List Patient Active Problem List   Diagnosis Date Noted  . Goals of care, counseling/discussion   . Acute blood loss anemia 09/28/2016  . CAD (coronary artery disease)   . Pancytopenia ([place])   . Epistaxis, recurrent   . Hyperuricemia   . Acute myeloid leukemia in adult [facility])   . AKI (acute kidney injury)  ([place])   . Troponin level elevated   . Leukocytosis   . Lobar pneumonia, unspecified organism ([place])   . Acute on chronic kidney failure ([place]) 09/04/2016  . Acute on chronic systolic CHF (congestive heart failure) ([place]) 09/04/2016  . Thrombocytopenia ([place]) 09/04/2016  . Cellulitis 09/04/2016  . Chronic combined systolic and diastolic CHF (congestive heart failure) ([place]) 09/04/2016  . Paroxysmal atrial fibrillation ([facility])   . Cricopharyngeal dysphagia 11/06/2015  . Esophageal dysphagia 11/06/2015  . Carotid disease, bilateral ([place]) 12/05/2014  . TIA (transient ischemic attack) 12/04/2014  . MGUS (monoclonal gammopathy of unknown significance) 03/29/2011  . Renal cancer ([place]) 03/29/2011    Class: Diagnosis of  . Shortness of breath 12/24/2010  . EDEMA 11/17/2010  . HEADACHE 10/28/2009  .  Coronary artery disease due to lipid rich plaque 09/25/2009  . NOSEBLEED 09/22/2009  . Hypothyroidism, acquired 06/29/2009  . DYSLIPIDEMIA 06/29/2009  . Essential hypertension 06/29/2009  . CHEST PAIN 06/29/2009   Ihor Austin, [place]; [place]  Aldona Lento 10/25/2018, 2:56 PM  [facility] [address] [place], [place], [address] Phone: [phone]   Fax:  [phone]  Name: Brian Mcmillan MRN: [id number] Date of Birth: Feb 20, 1925

## 2018-10-27 ENCOUNTER — Ambulatory Visit (HOSPITAL_COMMUNITY): Payer: Medicare Other | Admitting: Physical Therapy

## 2018-10-27 ENCOUNTER — Encounter

## 2018-10-27 ENCOUNTER — Encounter (HOSPITAL_COMMUNITY): Payer: Self-pay | Admitting: Physical Therapy

## 2018-10-27 DIAGNOSIS — M545 Low back pain, unspecified: Secondary | ICD-10-CM

## 2018-10-27 DIAGNOSIS — R2681 Unsteadiness on feet: Secondary | ICD-10-CM | POA: Diagnosis not present

## 2018-10-27 DIAGNOSIS — R262 Difficulty in walking, not elsewhere classified: Secondary | ICD-10-CM

## 2018-10-27 NOTE — Therapy (Signed)
[facility] [address] [place], [place], [address] Phone: [phone]   Fax:  [phone]  Physical Therapy Treatment  Patient Details  Name: Brian Mcmillan MRN: [id number] Date of Birth: 09-05-1925 Referring Provider (PT): Everardo All   Encounter Date: 10/27/2018  PT End of Session - 10/27/18 1437    Visit Number  3    Number of Visits  18    Date for PT Re-Evaluation  12/04/18    Authorization Type  Medicare A & B    Authorization Time Period  1/13-->12/04/18    PT Start Time  1351   Patient arrived late   PT Stop Time  1431    PT Time Calculation (min)  40 min    Activity Tolerance  Patient tolerated treatment well    Behavior During Therapy  [facility] for tasks assessed/performed       Past Medical History:  Diagnosis Date  . Abdominal mass 2008   chronic inflammatory mass of the mesentary Dr [name]  . 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 Anemia   . Anginal pain ([place])   . CAD (coronary artery disease)    cath 09/23/09 70% Dx1 (diffuse disease), 40% Cx, Large RCA 80-90% with heavy calcification / cath 06/2010 no change tiht RCA still best to treat medially with nosebleeds  . Chest pain    myoview 2006 no ischemia/ myoview 2009 no ischemia  . CHF (congestive heart failure) ([place])   . CKD (chronic kidney disease)   . COPD (chronic obstructive pulmonary disease) ([place])   . Dyslipidemia   . GERD (gastroesophageal reflux disease)   . Gout   . History of bleeding ulcers    "had 13 back in the early 1980s" (09/28/2016)  . History of blood transfusion 09/28/2016  . Hypertension    no RAS  . Hypothyroidism   . Kidney mass 2008   [facility]frequency [facility] at [facility]  . Left ventricular dysfunction    myoview 2006 normal / myoview 2009 normal / EF 60% cath 09/2009, EF 60% cath 06/2010  . Memory impairment    memory decreasing 06/2010  . Myocardial infarction ([place]) <2016X 1; 2016   "@ [facility]; @ [facility]"  . Nosebleed  "frequently since ~ 1966"   significant, can not take ASA  . Pneumonia 08/2016-09/2016  . Rash Sept 2011   rash on buttocks, hospitalized hydralazine stopped but then restarted w/o return of rash  . Renal cancer ([place])   . Skin cancer of scalp   . Systolic click    no mitral valve prolapse  . Type II diabetes mellitus ([place])     Past Surgical History:  Procedure Laterality Date  . BACK SURGERY    . BONE MARROW ASPIRATION     X2  . BONE MARROW BIOPSY     X2  . CARDIAC CATHETERIZATION    . CATARACT EXTRACTION W/ INTRAOCULAR LENS  IMPLANT, BILATERAL Bilateral   . COLONOSCOPY N/A 03/07/2013   Procedure: COLONOSCOPY;  Surgeon: [name], MD;  [facility];  Service: Endoscopy;  Laterality: N/A;  830-moved to 60 Ann to notify pt  . COLONOSCOPY  01/2013  . LUMBAR DISC SURGERY     "L4-5"  . LYMPH NODE DISSECTION Right     abdomen  . LYMPH NODE DISSECTION Left    robotic laser @ [facility]  on left side of stomach  . MOHS SURGERY     scalp; "22 stitches around; 9 stitches down;  went all the way to the skull"  . NASAL SEPTUM SURGERY    . RFA RIGHT KIDNEY    . TONSILLECTOMY  1933    There were no vitals filed for this visit.  Subjective Assessment - 10/27/18 1357    Subjective  Patient reported that he feels he hurt his back a little more when he stood up earlier today.    Pertinent History  TIA, CKD, CAD, HTN, Hx of renal cancer.     Patient Stated Goals  To be able to walk safer, and move in and out of bed easier.     Currently in Pain?  Yes    Pain Score  8     Pain Location  Back    Pain Orientation  Lower    Pain Descriptors / Indicators  Aching    Pain Type  Chronic pain                       [facility] Adult PT Treatment/Exercise - 10/27/18 0001      Bed Mobility   Bed Mobility  Sit to Supine    Sit to Supine  Minimal Assistance - Patient > 75%      Lumbar Exercises: Stretches   Single Knee to Chest Stretch  3 reps;20 seconds    Single Knee  to Chest Stretch Limitations  Each lower extremity      Lumbar Exercises: Seated   Other Seated Lumbar Exercises  sitting as tall as possible x 10 seconds x5; scapular retraction x 10       Lumbar Exercises: Supine   Other Supine Lumbar Exercises  Decompression 1-5 10x each             PT Education - 10/27/18 1436    Education Details  Reviewed HEP and discussed purpose and technique of interventions throughout session.     Person(s) Educated  Patient    Methods  Explanation;Demonstration;Tactile cues;Verbal cues    Comprehension  Verbalized understanding       PT Short Term Goals - 10/23/18 1619      PT SHORT TERM GOAL #1   Title  Pt to be able to get in and out of bed with ease.     Time  3    Period  Weeks    Status  New    Target Date  11/13/18      PT SHORT TERM GOAL #2   Title  PT LE strength to be increased 1/2 grade to allow pt to come sit to stand with UE assist with increased ease.     Time  3    Period  Weeks    Status  New      PT SHORT TERM GOAL #3   Title  PT back pain to decrease to no greater than a 4/10 to allow pt to walk with his quad cane for 5 minutes without increased pain.     Time  3    Period  Weeks    Status  New      PT SHORT TERM GOAL #4   Title  PT to be able to single leg stance on both LE for 5 seconds to decrease risk of falling.     Time  3    Period  Weeks    Status  New        PT Long Term Goals - 10/23/18 1621      PT LONG TERM GOAL #  1   Title  PT LE strength to be increased by one grade to allow pt to come sit to stand without having to use his UE>     Time  6    Period  Weeks    Status  New    Target Date  12/04/18      PT LONG TERM GOAL #2   Title  PT back pain to be no greater than a 2/10 to allow pt to walk with his cane painfree for ten minutes to be able to complete household tasks.     Time  6    Period  Weeks    Status  New      PT LONG TERM GOAL #3   Title  PT ROM of hip and back to improve to allow pt  to be able to don shoes and socks without pain     Time  6    Period  Weeks    Status  New      PT LONG TERM GOAL #4   Title  PT to be able to single leg balance on both LE for 15 seconds to allow pt to feel confident walking on uneven ground.     Time  6    Period  Weeks    Status  New            Plan - 10/27/18 1438    Clinical Impression Statement  This session continued with established plan of care. This session added single knee to chest with exercises and also increased number of repetitions with decompression exercises to 10x on each. Patient tolerated all exercises well however performed at a very slow pace and required frequent verbal cues and tactile cues for form.    Rehab Potential  Good    PT Frequency  3x / week    PT Duration  6 weeks    PT Treatment/Interventions  ADLs/Self Care Home Management;Patient/family education;Therapeutic exercise;Therapeutic activities;Balance training;Functional mobility training;Stair training;Gait training;DME Instruction;Manual techniques    PT Next Visit Plan  Review compliance with HEP.  Consider beginning sit to stands next session and lower trunk rotation exercises.  Continue cervical retraction and SKTC  decompression exercises 4-5; begin bed mobility and supine stabilization exercises.    PT Home Exercise Plan  sitting tall with scapular retraction, decompression 1-3       Patient will benefit from skilled therapeutic intervention in order to improve the following deficits and impairments:  Abnormal gait, Decreased activity tolerance, Decreased balance, Decreased mobility, Decreased strength, Decreased range of motion, Difficulty walking, Postural dysfunction, Pain, Improper body mechanics  Visit Diagnosis: Acute bilateral low back pain without sciatica  Difficulty in walking, not elsewhere classified  Unsteadiness on feet     Problem List Patient Active Problem List   Diagnosis Date Noted  . Goals of care,  counseling/discussion   . Acute blood loss anemia 09/28/2016  . CAD (coronary artery disease)   . Pancytopenia ([place])   . Epistaxis, recurrent   . Hyperuricemia   . Acute myeloid leukemia in adult [facility])   . AKI (acute kidney injury) ([place])   . Troponin level elevated   . Leukocytosis   . Lobar pneumonia, unspecified organism ([place])   . Acute on chronic kidney failure ([place]) 09/04/2016  . Acute on chronic systolic CHF (congestive heart failure) ([place]) 09/04/2016  . Thrombocytopenia ([place]) 09/04/2016  . Cellulitis 09/04/2016  . Chronic combined systolic and diastolic CHF (congestive heart failure) ([place]) 09/04/2016  .  Paroxysmal atrial fibrillation ([facility])   . Cricopharyngeal dysphagia 11/06/2015  . Esophageal dysphagia 11/06/2015  . Carotid disease, bilateral ([place]) 12/05/2014  . TIA (transient ischemic attack) 12/04/2014  . MGUS (monoclonal gammopathy of unknown significance) 03/29/2011  . Renal cancer ([place]) 03/29/2011    Class: Diagnosis of  . Shortness of breath 12/24/2010  . EDEMA 11/17/2010  . HEADACHE 10/28/2009  . Coronary artery disease due to lipid rich plaque 09/25/2009  . NOSEBLEED 09/22/2009  . Hypothyroidism, acquired 06/29/2009  . DYSLIPIDEMIA 06/29/2009  . Essential hypertension 06/29/2009  . CHEST PAIN 06/29/2009   Clarene Critchley PT, DPT 2:43 PM, 10/27/18 [place], [place], [address] Phone: [phone]   Fax:  [phone]  Name: ANTUAN LIMES MRN: [id number] Date of Birth: 1925/04/25

## 2018-10-30 ENCOUNTER — Ambulatory Visit (HOSPITAL_COMMUNITY): Payer: Medicare Other | Admitting: Physical Therapy

## 2018-10-30 DIAGNOSIS — M545 Low back pain, unspecified: Secondary | ICD-10-CM

## 2018-10-30 DIAGNOSIS — R262 Difficulty in walking, not elsewhere classified: Secondary | ICD-10-CM | POA: Diagnosis not present

## 2018-10-30 DIAGNOSIS — R2681 Unsteadiness on feet: Secondary | ICD-10-CM

## 2018-10-30 NOTE — Therapy (Signed)
[facility] [address] [place], [place], [address] Phone: [phone]   Fax:  [phone]  Physical Therapy Treatment  Patient Details  Name: Brian Mcmillan MRN: [id number] Date of Birth: 1925/01/24 Referring Provider (PT): Everardo All   Encounter Date: 10/30/2018  PT End of Session - 10/30/18 1527    Visit Number  4    Number of Visits  18    Date for PT Re-Evaluation  12/04/18    Authorization Type  Medicare A & B    Authorization Time Period  1/13-->12/04/18    PT Start Time  1430    PT Stop Time  1510    PT Time Calculation (min)  40 min    Activity Tolerance  Patient tolerated treatment well    Behavior During Therapy  [facility] for tasks assessed/performed       Past Medical History:  Diagnosis Date  . Abdominal mass 2008   chronic inflammatory mass of the mesentary Dr [name]  . 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 Anemia   . Anginal pain ([place])   . CAD (coronary artery disease)    cath 09/23/09 70% Dx1 (diffuse disease), 40% Cx, Large RCA 80-90% with heavy calcification / cath 06/2010 no change tiht RCA still best to treat medially with nosebleeds  . Chest pain    myoview 2006 no ischemia/ myoview 2009 no ischemia  . CHF (congestive heart failure) ([place])   . CKD (chronic kidney disease)   . COPD (chronic obstructive pulmonary disease) ([place])   . Dyslipidemia   . GERD (gastroesophageal reflux disease)   . Gout   . History of bleeding ulcers    "had 13 back in the early 1980s" (09/28/2016)  . History of blood transfusion 09/28/2016  . Hypertension    no RAS  . Hypothyroidism   . Kidney mass 2008   radiofrequency [facility]  . Left ventricular dysfunction    myoview 2006 normal / myoview 2009 normal / EF 60% cath 09/2009, EF 60% cath 06/2010  . Memory impairment    memory decreasing 06/2010  . Myocardial infarction ([place]) <2016X 1; 2016   "@ [facility]; @ [facility]"  . Nosebleed "frequently since ~  1966"   significant, can not take ASA  . Pneumonia 08/2016-09/2016  . Rash Sept 2011   rash on buttocks, hospitalized hydralazine stopped but then restarted w/o return of rash  . Renal cancer ([place])   . Skin cancer of scalp   . Systolic click    no mitral valve prolapse  . Type II diabetes mellitus ([place])     Past Surgical History:  Procedure Laterality Date  . BACK SURGERY    . BONE MARROW ASPIRATION     X2  . BONE MARROW BIOPSY     X2  . CARDIAC CATHETERIZATION    . CATARACT EXTRACTION W/ INTRAOCULAR LENS  IMPLANT, BILATERAL Bilateral   . COLONOSCOPY N/A 03/07/2013   Procedure: COLONOSCOPY;  Surgeon: [name], MD;  [facility];  Service: Endoscopy;  Laterality: N/A;  830-moved to [address] Ann to notify pt  . COLONOSCOPY  01/2013  . LUMBAR DISC SURGERY     "L4-5"  . LYMPH NODE DISSECTION Right     abdomen  . LYMPH NODE DISSECTION Left    robotic laser @ [facility]  on left side of stomach  . MOHS SURGERY     scalp; "22 stitches around; 9 stitches down; went all the way  to the skull"  . NASAL SEPTUM SURGERY    . RFA RIGHT KIDNEY    . TONSILLECTOMY  1933    There were no vitals filed for this visit.       Subjective:  Pt states that getting in and out of bed is easier.   His back pain is at a 3/10 and he is limiting his  Activity to keep it in check.               [facility] Adult PT Treatment/Exercise - 10/30/18 0001      Bed Mobility   Bed Mobility  Rolling Right;Rolling Left;Right Sidelying to Sit;Sit to Sidelying Right    Rolling Right  Supervision/verbal cueing   x10   Rolling Left  Supervision/Verbal cueing   x10   Right Sidelying to Sit  Minimal Assistance - Patient > 75%    Sit to Supine  Contact Guard/Touching assist    Sit to Sidelying Right  Contact Guard/Touching assist   x5     Lumbar Exercises: Seated   Other Seated Lumbar Exercises  sitting as tall as possible x 10 seconds x5; scapular retraction x 10       Lumbar Exercises:  Supine   Ab Set  5 reps    Clam  5 reps    Bent Knee Raise  10 reps    Bridge  5 reps    Other Supine Lumbar Exercises  Decompression 1-5 10x each    Other Supine Lumbar Exercises  t bamd decompression x 5 each              PT Education - 10/30/18 1518    Education Details  bed mobility; new exercises     Person(s) Educated  Patient    Methods  Explanation    Comprehension  Returned demonstration;Need further instruction       PT Short Term Goals - 10/30/18 1530      PT SHORT TERM GOAL #1   Title  Pt to be able to get in and out of bed with ease.     Time  3    Period  Weeks    Status  On-going      PT SHORT TERM GOAL #2   Title  PT LE strength to be increased 1/2 grade to allow pt to come sit to stand with UE assist with increased ease.     Time  3    Period  Weeks    Status  On-going      PT SHORT TERM GOAL #3   Title  PT back pain to decrease to no greater than a 4/10 to allow pt to walk with his quad cane for 5 minutes without increased pain.     Time  3    Period  Weeks    Status  On-going      PT SHORT TERM GOAL #4   Title  PT to be able to single leg stance on both LE for 5 seconds to decrease risk of falling.     Time  3    Period  Weeks    Status  On-going        PT Long Term Goals - 10/30/18 1530      PT LONG TERM GOAL #1   Title  PT LE strength to be increased by one grade to allow pt to come sit to stand without having to use his UE>     Time  6  Period  Weeks    Status  On-going      PT LONG TERM GOAL #2   Title  PT back pain to be no greater than a 2/10 to allow pt to walk with his cane painfree for ten minutes to be able to complete household tasks.     Time  6    Period  Weeks    Status  On-going      PT LONG TERM GOAL #3   Title  PT ROM of hip and back to improve to allow pt to be able to don shoes and socks without pain     Time  6    Period  Weeks    Status  On-going      PT LONG TERM GOAL #4   Title  PT to be able to  single leg balance on both LE for 15 seconds to allow pt to feel confident walking on uneven ground.     Time  6    Period  Weeks    Status  On-going            Plan - 10/30/18 1528    Clinical Impression Statement  Added t-band decompression exercises as well as begining supine stabilization.  Reviewed bed mobility with continued manual and verbal cuing required.     Rehab Potential  Good    PT Frequency  3x / week    PT Duration  6 weeks    PT Treatment/Interventions  ADLs/Self Care Home Management;Patient/family education;Therapeutic exercise;Therapeutic activities;Balance training;Functional mobility training;Stair training;Gait training;DME Instruction;Manual techniques    PT Next Visit Plan  Review compliance with HEP. .  Continue  bed mobility and supine stabilization exercises.    PT Home Exercise Plan  sitting tall with scapular retraction, decompression 1-3; 1/20:  bend knee lift , clams        Patient will benefit from skilled therapeutic intervention in order to improve the following deficits and impairments:  Abnormal gait, Decreased activity tolerance, Decreased balance, Decreased mobility, Decreased strength, Decreased range of motion, Difficulty walking, Postural dysfunction, Pain, Improper body mechanics  Visit Diagnosis: Acute bilateral low back pain without sciatica  Difficulty in walking, not elsewhere classified  Unsteadiness on feet     Problem List Patient Active Problem List   Diagnosis Date Noted  . Goals of care, counseling/discussion   . Acute blood loss anemia 09/28/2016  . CAD (coronary artery disease)   . Pancytopenia ([place])   . Epistaxis, recurrent   . Hyperuricemia   . Acute myeloid leukemia in adult [facility])   . AKI (acute kidney injury) ([place])   . Troponin level elevated   . Leukocytosis   . Lobar pneumonia, unspecified organism ([place])   . Acute on chronic kidney failure ([place]) 09/04/2016  . Acute on chronic systolic CHF (congestive heart  failure) ([place]) 09/04/2016  . Thrombocytopenia ([place]) 09/04/2016  . Cellulitis 09/04/2016  . Chronic combined systolic and diastolic CHF (congestive heart failure) ([place]) 09/04/2016  . Paroxysmal atrial fibrillation ([facility])   . Cricopharyngeal dysphagia 11/06/2015  . Esophageal dysphagia 11/06/2015  . Carotid disease, bilateral ([place]) 12/05/2014  . TIA (transient ischemic attack) 12/04/2014  . MGUS (monoclonal gammopathy of unknown significance) 03/29/2011  . Renal cancer ([place]) 03/29/2011    Class: Diagnosis of  . Shortness of breath 12/24/2010  . EDEMA 11/17/2010  . HEADACHE 10/28/2009  . Coronary artery disease due to lipid rich plaque 09/25/2009  . NOSEBLEED 09/22/2009  . Hypothyroidism, acquired 06/29/2009  .  DYSLIPIDEMIA 06/29/2009  . Essential hypertension 06/29/2009  . CHEST PAIN 06/29/2009   Rayetta Humphrey, PT CLT [id number] 10/30/2018, 3:31 PM  [facility] [address] [place], [place], [address] Phone: [phone]   Fax:  [phone]  Name: Brian Mcmillan MRN: [id number] Date of Birth: Feb 13, 1925

## 2018-10-30 NOTE — Patient Instructions (Addendum)
Hip Abduction / Adduction: with Knee Flexion (Supine)    With right knee bent, gently lower knee to side and return. Repeat _5___ times per set. Do __1__ sets per session. Do ___2_ sessions per day. Repeat to the left  http://orth.exer.us/682   Copyright  VHI. All rights reserved.  Bent Leg Lift (Hook-Lying)    Tighten stomach and slowly raise right leg _3___ inches from floor. Keep trunk rigid. Hold __3__ seconds. Repeat _10___ times per set. Do _1___ sets per session. Do __2__ sessions per day.  http://orth.exer.us/1090   Copyright  VHI. All rights reserved.

## 2018-10-31 DIAGNOSIS — I70203 Unspecified atherosclerosis of native arteries of extremities, bilateral legs: Secondary | ICD-10-CM | POA: Diagnosis not present

## 2018-10-31 DIAGNOSIS — B351 Tinea unguium: Secondary | ICD-10-CM | POA: Diagnosis not present

## 2018-10-31 DIAGNOSIS — M79676 Pain in unspecified toe(s): Secondary | ICD-10-CM | POA: Diagnosis not present

## 2018-10-31 DIAGNOSIS — L84 Corns and callosities: Secondary | ICD-10-CM | POA: Diagnosis not present

## 2018-11-01 ENCOUNTER — Ambulatory Visit (HOSPITAL_COMMUNITY): Payer: Medicare Other

## 2018-11-01 ENCOUNTER — Encounter (HOSPITAL_COMMUNITY): Payer: Self-pay

## 2018-11-01 DIAGNOSIS — R262 Difficulty in walking, not elsewhere classified: Secondary | ICD-10-CM | POA: Diagnosis not present

## 2018-11-01 DIAGNOSIS — M545 Low back pain, unspecified: Secondary | ICD-10-CM

## 2018-11-01 DIAGNOSIS — R2681 Unsteadiness on feet: Secondary | ICD-10-CM | POA: Diagnosis not present

## 2018-11-01 NOTE — Therapy (Signed)
[facility] [place], [place], [address] Phone: [phone]   Fax:  [phone]  Physical Therapy Treatment  Patient Details  Name: Brian Mcmillan MRN: [id number] Date of Birth: [birth year] Referring Provider (PT): Everardo All   Encounter Date: 11/01/2018  PT End of Session - 11/01/18 1353    Visit Number  5    Number of Visits  18    Date for PT Re-Evaluation  12/04/18    Authorization Type  Medicare A & B    Authorization Time Period  1/13-->12/04/18    Authorization - Visit Number  5    Authorization - Number of Visits  10    PT Start Time  0102    PT Stop Time  0147    PT Time Calculation (min)  45 min    Activity Tolerance  Patient tolerated treatment well    Behavior During Therapy  [facility] for tasks assessed/performed       Past Medical History:  Diagnosis Date  . Abdominal mass 2008   chronic inflammatory mass of the mesentary Dr [name]  . 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 Anemia   . Anginal pain ([place])   . CAD (coronary artery disease)    cath 09/23/09 70% Dx1 (diffuse disease), 40% Cx, Large RCA 80-90% with heavy calcification / cath 06/2010 no change tiht RCA still best to treat medially with nosebleeds  . Chest pain    myoview 2006 no ischemia/ myoview 2009 no ischemia  . CHF (congestive heart failure) ([place])   . CKD (chronic kidney disease)   . COPD (chronic obstructive pulmonary disease) ([place])   . Dyslipidemia   . GERD (gastroesophageal reflux disease)   . Gout   . History of bleeding ulcers    "had 13 back in the early 1980s" (09/28/2016)  . History of blood transfusion 09/28/2016  . Hypertension    no RAS  . Hypothyroidism   . Kidney mass 2008   radiofrequency ablation at [facility]  . Left ventricular dysfunction    myoview 2006 normal / myoview 2009 normal / EF 60% cath 09/2009, EF 60% cath 06/2010  . Memory impairment    memory decreasing 06/2010  . Myocardial infarction  ([place]) <2016X 1; 2016   "@ [facility]; @ [facility]"  . Nosebleed "frequently since ~ 1966"   significant, can not take ASA  . Pneumonia 08/2016-09/2016  . Rash Sept 2011   rash on buttocks, hospitalized hydralazine stopped but then restarted w/o return of rash  . Renal cancer ([place])   . Skin cancer of scalp   . Systolic click    no mitral valve prolapse  . Type II diabetes mellitus ([place])     Past Surgical History:  Procedure Laterality Date  . BACK SURGERY    . BONE MARROW ASPIRATION     X2  . BONE MARROW BIOPSY     X2  . CARDIAC CATHETERIZATION    . CATARACT EXTRACTION W/ INTRAOCULAR LENS  IMPLANT, BILATERAL Bilateral   . COLONOSCOPY N/A 03/07/2013   Procedure: COLONOSCOPY;  Surgeon: [name], MD;  [facility];  Service: Endoscopy;  Laterality: N/A;  830-moved to [address] Ann to notify pt  . COLONOSCOPY  01/2013  . LUMBAR DISC SURGERY     "L4-5"  . LYMPH NODE DISSECTION Right     abdomen  . LYMPH NODE DISSECTION Left    robotic laser @ [facility]  on left side of stomach  .  MOHS SURGERY     scalp; "22 stitches around; 9 stitches down; went all the way to the skull"  . NASAL SEPTUM SURGERY    . RFA RIGHT KIDNEY    . TONSILLECTOMY  1933    There were no vitals filed for this visit.  Subjective Assessment - 11/01/18 1257    Subjective  Low back just sore across.     Pertinent History  TIA, CKD, CAD, HTN, Hx of renal cancer.     Patient Stated Goals  To be able to walk safer, and move in and out of bed easier.     Currently in Pain?  Yes    Pain Score  5     Pain Location  Back    Pain Orientation  Posterior;Lower;Left;Right    Pain Descriptors / Indicators  Aching                       [facility] Adult PT Treatment/Exercise - 11/01/18 0001      Bed Mobility   Bed Mobility  Rolling Right;Rolling Left;Right Sidelying to Sit;Sit to Sidelying Right    Rolling Right  Supervision/verbal cueing   x10   Rolling Left  Supervision/Verbal cueing   x10   Right  Sidelying to Sit  Minimal Assistance - Patient > 75%    Sit to Supine  Contact Guard/Touching assist    Sit to Sidelying Right  Contact Guard/Touching assist   x5     Lumbar Exercises: Seated   Sit to Stand  10 reps   slight 2 hands held assistance   Other Seated Lumbar Exercises  sitting as tall as possible x 10 seconds x5; scapular retraction x 10       Lumbar Exercises: Supine   Ab Set  10 reps    Bent Knee Raise  10 reps    Other Supine Lumbar Exercises  Decompression 1-5 10x each    Other Supine Lumbar Exercises  red t band decompression x 5 each              PT Education - 11/01/18 1352    Education Details  Discussed purpose and technique of interventions throughout session.    Person(s) Educated  Patient    Methods  Explanation;Demonstration    Comprehension  Verbalized understanding;Need further instruction       PT Short Term Goals - 10/30/18 1530      PT SHORT TERM GOAL #1   Title  Pt to be able to get in and out of bed with ease.     Time  3    Period  Weeks    Status  On-going      PT SHORT TERM GOAL #2   Title  PT LE strength to be increased 1/2 grade to allow pt to come sit to stand with UE assist with increased ease.     Time  3    Period  Weeks    Status  On-going      PT SHORT TERM GOAL #3   Title  PT back pain to decrease to no greater than a 4/10 to allow pt to walk with his quad cane for 5 minutes without increased pain.     Time  3    Period  Weeks    Status  On-going      PT SHORT TERM GOAL #4   Title  PT to be able to single leg stance on both LE for 5  seconds to decrease risk of falling.     Time  3    Period  Weeks    Status  On-going        PT Long Term Goals - 10/30/18 1530      PT LONG TERM GOAL #1   Title  PT LE strength to be increased by one grade to allow pt to come sit to stand without having to use his UE>     Time  6    Period  Weeks    Status  On-going      PT LONG TERM GOAL #2   Title  PT back pain to be no  greater than a 2/10 to allow pt to walk with his cane painfree for ten minutes to be able to complete household tasks.     Time  6    Period  Weeks    Status  On-going      PT LONG TERM GOAL #3   Title  PT ROM of hip and back to improve to allow pt to be able to don shoes and socks without pain     Time  6    Period  Weeks    Status  On-going      PT LONG TERM GOAL #4   Title  PT to be able to single leg balance on both LE for 15 seconds to allow pt to feel confident walking on uneven ground.     Time  6    Period  Weeks    Status  On-going            Plan - 11/01/18 1354    Clinical Impression Statement  Continued with established plan of care. Continue with previous therapeutic exercises with addition of 10x sit to stands with slight assistance through two hands held. Continue with current plan, progressing as able.     Rehab Potential  Good    PT Frequency  3x / week    PT Duration  6 weeks    PT Treatment/Interventions  ADLs/Self Care Home Management;Patient/family education;Therapeutic exercise;Therapeutic activities;Balance training;Functional mobility training;Stair training;Gait training;DME Instruction;Manual techniques    PT Next Visit Plan  Review compliance with HEP. .  Continue  bed mobility and supine stabilization exercises.    PT Home Exercise Plan  sitting tall with scapular retraction, decompression 1-3; 1/20:  bend knee lift , clams        Patient will benefit from skilled therapeutic intervention in order to improve the following deficits and impairments:  Abnormal gait, Decreased activity tolerance, Decreased balance, Decreased mobility, Decreased strength, Decreased range of motion, Difficulty walking, Postural dysfunction, Pain, Improper body mechanics  Visit Diagnosis: Acute bilateral low back pain without sciatica  Difficulty in walking, not elsewhere classified  Unsteadiness on feet     Problem List Patient Active Problem List   Diagnosis  Date Noted  . Goals of care, counseling/discussion   . Acute blood loss anemia 09/28/2016  . CAD (coronary artery disease)   . Pancytopenia ([place])   . Epistaxis, recurrent   . Hyperuricemia   . Acute myeloid leukemia in adult [facility])   . AKI (acute kidney injury) ([place])   . Troponin level elevated   . Leukocytosis   . Lobar pneumonia, unspecified organism ([place])   . Acute on chronic kidney failure ([place]) 09/04/2016  . Acute on chronic systolic CHF (congestive heart failure) ([place]) 09/04/2016  . Thrombocytopenia ([place]) 09/04/2016  . Cellulitis 09/04/2016  . Chronic combined  systolic and diastolic CHF (congestive heart failure) ([place]) 09/04/2016  . Paroxysmal atrial fibrillation ([facility])   . Cricopharyngeal dysphagia 11/06/2015  . Esophageal dysphagia 11/06/2015  . Carotid disease, bilateral ([place]) 12/05/2014  . TIA (transient ischemic attack) 12/04/2014  . MGUS (monoclonal gammopathy of unknown significance) 03/29/2011  . Renal cancer ([place]) 03/29/2011    Class: Diagnosis of  . Shortness of breath 12/24/2010  . EDEMA 11/17/2010  . HEADACHE 10/28/2009  . Coronary artery disease due to lipid rich plaque 09/25/2009  . NOSEBLEED 09/22/2009  . Hypothyroidism, acquired 06/29/2009  . DYSLIPIDEMIA 06/29/2009  . Essential hypertension 06/29/2009  . CHEST PAIN 06/29/2009    Floria Raveling. Hartnett-Rands, MS, PT [facility] [place] #[id number] 11/01/2018, 1:55 PM  [facility] [address] [place], [place], [address] Phone: [phone]   Fax:  [phone]  Name: Brian Mcmillan MRN: [id number] Date of Birth: September 02, 1925

## 2018-11-03 ENCOUNTER — Ambulatory Visit (HOSPITAL_COMMUNITY): Payer: Medicare Other

## 2018-11-03 DIAGNOSIS — M545 Low back pain, unspecified: Secondary | ICD-10-CM

## 2018-11-03 DIAGNOSIS — R262 Difficulty in walking, not elsewhere classified: Secondary | ICD-10-CM | POA: Diagnosis not present

## 2018-11-03 DIAGNOSIS — R2681 Unsteadiness on feet: Secondary | ICD-10-CM | POA: Diagnosis not present

## 2018-11-03 NOTE — Therapy (Signed)
[facility] [address] [place], [place], [address] Phone: [phone]   Fax:  [phone]  Physical Therapy Treatment  Patient Details  Name: Brian Mcmillan MRN: [id number] Date of Birth: Jun 16, 1925 Referring Provider (PT): Everardo All   Encounter Date: 11/03/2018  PT End of Session - 11/03/18 1327    Visit Number  6    Number of Visits  18    Date for PT Re-Evaluation  12/04/18    Authorization Type  Medicare A & B    Authorization Time Period  1/13-->12/04/18    Authorization - Visit Number  6    Authorization - Number of Visits  10    PT Start Time  1310    PT Stop Time  1348    PT Time Calculation (min)  38 min    Activity Tolerance  Patient tolerated treatment well    Behavior During Therapy  [facility] for tasks assessed/performed       Past Medical History:  Diagnosis Date  . Abdominal mass 2008   chronic inflammatory mass of the mesentary Dr [name]  . 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 Anemia   . Anginal pain ([place])   . CAD (coronary artery disease)    cath 09/23/09 70% Dx1 (diffuse disease), 40% Cx, Large RCA 80-90% with heavy calcification / cath 06/2010 no change tiht RCA still best to treat medially with nosebleeds  . Chest pain    myoview 2006 no ischemia/ myoview 2009 no ischemia  . CHF (congestive heart failure) ([place])   . CKD (chronic kidney disease)   . COPD (chronic obstructive pulmonary disease) ([place])   . Dyslipidemia   . GERD (gastroesophageal reflux disease)   . Gout   . History of bleeding ulcers    "had 13 back in the early 1980s" (09/28/2016)  . History of blood transfusion 09/28/2016  . Hypertension    no RAS  . Hypothyroidism   . Kidney mass 2008   [facility]  . Left ventricular dysfunction    myoview 2006 normal / myoview 2009 normal / EF 60% cath 09/2009, EF 60% cath 06/2010  . Memory impairment    memory decreasing 06/2010  . Myocardial infarction  ([place]) <2016X 1; 2016   "@ [facility]; @ [facility]"  . Nosebleed "frequently since ~ 1966"   significant, can not take ASA  . Pneumonia 08/2016-09/2016  . Rash Sept 2011   rash on buttocks, hospitalized hydralazine stopped but then restarted w/o return of rash  . Renal cancer ([place])   . Skin cancer of scalp   . Systolic click    no mitral valve prolapse  . Type II diabetes mellitus ([place])     Past Surgical History:  Procedure Laterality Date  . BACK SURGERY    . BONE MARROW ASPIRATION     X2  . BONE MARROW BIOPSY     X2  . CARDIAC CATHETERIZATION    . CATARACT EXTRACTION W/ INTRAOCULAR LENS  IMPLANT, BILATERAL Bilateral   . COLONOSCOPY N/A 03/07/2013   Procedure: COLONOSCOPY;  Surgeon: [name], MD;  [facility];  Service: Endoscopy;  Laterality: N/A;  830-moved to 68 Ann to notify pt  . COLONOSCOPY  01/2013  . LUMBAR DISC SURGERY     "L4-5"  . LYMPH NODE DISSECTION Right     abdomen  . LYMPH NODE DISSECTION Left    robotic laser @ [facility]  on left side of stomach  .  MOHS SURGERY     scalp; "22 stitches around; 9 stitches down; went all the way to the skull"  . NASAL SEPTUM SURGERY    . RFA RIGHT KIDNEY    . TONSILLECTOMY  1933    There were no vitals filed for this visit.  Subjective Assessment - 11/03/18 1324    Subjective  Low back has really been doing better; he hasn't had any of the pains like he had when he first started PT. Struggling to get HEP done with all his other appointments he has to do to.     Pertinent History  TIA, CKD, CAD, HTN, Hx of renal cancer.     Patient Stated Goals  To be able to walk safer, and move in and out of bed easier.     Currently in Pain?  Yes    Pain Score  4     Pain Location  Back    Pain Orientation  Left;Posterior;Right;Lower    Pain Type  Chronic pain                       [facility] Adult PT Treatment/Exercise - 11/03/18 0001      Lumbar Exercises: Seated   Sit to Stand  10 reps   slight 2 hands held  assistance   Other Seated Lumbar Exercises  sitting as tall as possible x 10 seconds x5; scapular retraction x 10       Lumbar Exercises: Supine   Bridge  --    Other Supine Lumbar Exercises  Decompression 1-5 10x each    Other Supine Lumbar Exercises  red t band decompression x 5 each              PT Education - 11/03/18 1326    Education Details  Discussed purpose and technique of interventions throughout session. Advanced HEP to include decompression exercises 1-5 and exercises 1-4 with RTB.     Person(s) Educated  Patient    Methods  Explanation;Demonstration    Comprehension  Verbalized understanding;Need further instruction       PT Short Term Goals - 10/30/18 1530      PT SHORT TERM GOAL #1   Title  Pt to be able to get in and out of bed with ease.     Time  3    Period  Weeks    Status  On-going      PT SHORT TERM GOAL #2   Title  PT LE strength to be increased 1/2 grade to allow pt to come sit to stand with UE assist with increased ease.     Time  3    Period  Weeks    Status  On-going      PT SHORT TERM GOAL #3   Title  PT back pain to decrease to no greater than a 4/10 to allow pt to walk with his quad cane for 5 minutes without increased pain.     Time  3    Period  Weeks    Status  On-going      PT SHORT TERM GOAL #4   Title  PT to be able to single leg stance on both LE for 5 seconds to decrease risk of falling.     Time  3    Period  Weeks    Status  On-going        PT Long Term Goals - 10/30/18 1530      PT  LONG TERM GOAL #1   Title  PT LE strength to be increased by one grade to allow pt to come sit to stand without having to use his UE>     Time  6    Period  Weeks    Status  On-going      PT LONG TERM GOAL #2   Title  PT back pain to be no greater than a 2/10 to allow pt to walk with his cane painfree for ten minutes to be able to complete household tasks.     Time  6    Period  Weeks    Status  On-going      PT LONG TERM GOAL #3    Title  PT ROM of hip and back to improve to allow pt to be able to don shoes and socks without pain     Time  6    Period  Weeks    Status  On-going      PT LONG TERM GOAL #4   Title  PT to be able to single leg balance on both LE for 15 seconds to allow pt to feel confident walking on uneven ground.     Time  6    Period  Weeks    Status  On-going            Plan - 11/03/18 1328    Clinical Impression Statement  Continued with established plan of care. Continue with previous therapeutic exercises including 10x sit to stands with slight assistance through two hands held largely for balance rather than physical assistance. Patient continues to require cues for correct performance of exercises. Continue with current plan, progressing as able.     Rehab Potential  Good    PT Frequency  3x / week    PT Duration  6 weeks    PT Treatment/Interventions  ADLs/Self Care Home Management;Patient/family education;Therapeutic exercise;Therapeutic activities;Balance training;Functional mobility training;Stair training;Gait training;DME Instruction;Manual techniques    PT Next Visit Plan  Review compliance with HEP.  Continue bed mobility and supine stabilization exercises.    PT Home Exercise Plan  sitting tall with scapular retraction, decompression 1-3; 1/20:  bend knee lift , clams; 11/03/2018 -  decompression exercises 1-5 and 1-4 w/ RTB.        Patient will benefit from skilled therapeutic intervention in order to improve the following deficits and impairments:  Abnormal gait, Decreased activity tolerance, Decreased balance, Decreased mobility, Decreased strength, Decreased range of motion, Difficulty walking, Postural dysfunction, Pain, Improper body mechanics  Visit Diagnosis: Acute bilateral low back pain without sciatica  Difficulty in walking, not elsewhere classified  Unsteadiness on feet     Problem List Patient Active Problem List   Diagnosis Date Noted  . Goals of care,  counseling/discussion   . Acute blood loss anemia 09/28/2016  . CAD (coronary artery disease)   . Pancytopenia ([place])   . Epistaxis, recurrent   . Hyperuricemia   . Acute myeloid leukemia in adult [facility])   . AKI (acute kidney injury) ([place])   . Troponin level elevated   . Leukocytosis   . Lobar pneumonia, unspecified organism ([place])   . Acute on chronic kidney failure ([place]) 09/04/2016  . Acute on chronic systolic CHF (congestive heart failure) ([place]) 09/04/2016  . Thrombocytopenia ([place]) 09/04/2016  . Cellulitis 09/04/2016  . Chronic combined systolic and diastolic CHF (congestive heart failure) ([place]) 09/04/2016  . Paroxysmal atrial fibrillation ([facility])   . Cricopharyngeal dysphagia 11/06/2015  .  Esophageal dysphagia 11/06/2015  . Carotid disease, bilateral ([place]) 12/05/2014  . TIA (transient ischemic attack) 12/04/2014  . MGUS (monoclonal gammopathy of unknown significance) 03/29/2011  . Renal cancer ([place]) 03/29/2011    Class: Diagnosis of  . Shortness of breath 12/24/2010  . EDEMA 11/17/2010  . HEADACHE 10/28/2009  . Coronary artery disease due to lipid rich plaque 09/25/2009  . NOSEBLEED 09/22/2009  . Hypothyroidism, acquired 06/29/2009  . DYSLIPIDEMIA 06/29/2009  . Essential hypertension 06/29/2009  . CHEST PAIN 06/29/2009    Floria Raveling. Hartnett-Rands, MS, PT [facility] [place] #[id number] 11/03/2018, 2:44 PM  [facility] [address] [place], [place], [address] Phone: [phone]   Fax:  [phone]  Name: NASER SCHULD MRN: [id number] Date of Birth: Apr 11, 1925

## 2018-11-06 DIAGNOSIS — I208 Other forms of angina pectoris: Secondary | ICD-10-CM | POA: Diagnosis not present

## 2018-11-06 DIAGNOSIS — I10 Essential (primary) hypertension: Secondary | ICD-10-CM | POA: Diagnosis not present

## 2018-11-06 DIAGNOSIS — J439 Emphysema, unspecified: Secondary | ICD-10-CM | POA: Diagnosis not present

## 2018-11-06 DIAGNOSIS — I5022 Chronic systolic (congestive) heart failure: Secondary | ICD-10-CM | POA: Diagnosis not present

## 2018-11-06 DIAGNOSIS — I255 Ischemic cardiomyopathy: Secondary | ICD-10-CM | POA: Diagnosis not present

## 2018-11-06 DIAGNOSIS — E119 Type 2 diabetes mellitus without complications: Secondary | ICD-10-CM | POA: Diagnosis not present

## 2018-11-06 DIAGNOSIS — N183 Chronic kidney disease, stage 3 (moderate): Secondary | ICD-10-CM | POA: Diagnosis not present

## 2018-11-06 DIAGNOSIS — E782 Mixed hyperlipidemia: Secondary | ICD-10-CM | POA: Diagnosis not present

## 2018-11-06 DIAGNOSIS — I25118 Atherosclerotic heart disease of native coronary artery with other forms of angina pectoris: Secondary | ICD-10-CM | POA: Diagnosis not present

## 2018-11-08 ENCOUNTER — Ambulatory Visit (HOSPITAL_COMMUNITY): Payer: Medicare Other | Admitting: Physical Therapy

## 2018-11-08 ENCOUNTER — Encounter (HOSPITAL_COMMUNITY): Payer: Self-pay | Admitting: Physical Therapy

## 2018-11-08 ENCOUNTER — Telehealth (HOSPITAL_COMMUNITY): Payer: Self-pay | Admitting: Internal Medicine

## 2018-11-08 DIAGNOSIS — R262 Difficulty in walking, not elsewhere classified: Secondary | ICD-10-CM | POA: Diagnosis not present

## 2018-11-08 DIAGNOSIS — M545 Low back pain, unspecified: Secondary | ICD-10-CM

## 2018-11-08 DIAGNOSIS — R2681 Unsteadiness on feet: Secondary | ICD-10-CM

## 2018-11-08 NOTE — Telephone Encounter (Signed)
11/08/18  pt going to the [place] and I spoke to his wife and we cx this appt

## 2018-11-08 NOTE — Therapy (Signed)
[facility] [address] [place], [place], [address] Phone: [phone]   Fax:  [phone]  Physical Therapy Treatment  Patient Details  Name: Brian Mcmillan MRN: [id number] Date of Birth: Nov 04, 1924 Referring Provider (PT): Everardo All   Encounter Date: 11/08/2018  PT End of Session - 11/08/18 1137    Visit Number  7    Number of Visits  18    Date for PT Re-Evaluation  12/04/18    Authorization Type  Medicare A & B    Authorization Time Period  1/13-->12/04/18    Authorization - Visit Number  7    Authorization - Number of Visits  10    PT Start Time  1038    PT Stop Time  1120    PT Time Calculation (min)  42 min    Activity Tolerance  Patient tolerated treatment well    Behavior During Therapy  [facility] for tasks assessed/performed       Past Medical History:  Diagnosis Date  . Abdominal mass 2008   chronic inflammatory mass of the mesentary Dr [name]  . 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 Anemia   . Anginal pain ([place])   . CAD (coronary artery disease)    cath 09/23/09 70% Dx1 (diffuse disease), 40% Cx, Large RCA 80-90% with heavy calcification / cath 06/2010 no change tiht RCA still best to treat medially with nosebleeds  . Chest pain    myoview 2006 no ischemia/ myoview 2009 no ischemia  . CHF (congestive heart failure) ([place])   . CKD (chronic kidney disease)   . COPD (chronic obstructive pulmonary disease) ([place])   . Dyslipidemia   . GERD (gastroesophageal reflux disease)   . Gout   . History of bleeding ulcers    "had 13 back in the early 1980s" (09/28/2016)  . History of blood transfusion 09/28/2016  . Hypertension    no RAS  . Hypothyroidism   . Kidney mass 2008   [facility]frequency ablation at [facility]  . Left ventricular dysfunction    myoview 2006 normal / myoview 2009 normal / EF 60% cath 09/2009, EF 60% cath 06/2010  . Memory impairment    memory decreasing 06/2010  . Myocardial infarction  ([place]) <2016X 1; 2016   "@ [facility]; @ [facility]"  . Nosebleed "frequently since ~ 1966"   significant, can not take ASA  . Pneumonia 08/2016-09/2016  . Rash Sept 2011   rash on buttocks, hospitalized hydralazine stopped but then restarted w/o return of rash  . Renal cancer ([place])   . Skin cancer of scalp   . Systolic click    no mitral valve prolapse  . Type II diabetes mellitus ([place])     Past Surgical History:  Procedure Laterality Date  . BACK SURGERY    . BONE MARROW ASPIRATION     X2  . BONE MARROW BIOPSY     X2  . CARDIAC CATHETERIZATION    . CATARACT EXTRACTION W/ INTRAOCULAR LENS  IMPLANT, BILATERAL Bilateral   . COLONOSCOPY N/A 03/07/2013   Procedure: COLONOSCOPY;  Surgeon: [name], MD;  [facility];  Service: Endoscopy;  Laterality: N/A;  830-moved to 65 Ann to notify pt  . COLONOSCOPY  01/2013  . LUMBAR DISC SURGERY     "L4-5"  . LYMPH NODE DISSECTION Right     abdomen  . LYMPH NODE DISSECTION Left    robotic laser @ [facility]  on left side of stomach  .  MOHS SURGERY     scalp; "22 stitches around; 9 stitches down; went all the way to the skull"  . NASAL SEPTUM SURGERY    . RFA RIGHT KIDNEY    . TONSILLECTOMY  1933    There were no vitals filed for this visit.  Subjective Assessment - 11/08/18 1047    Subjective  PT states that he has no back pain today.   He has been busy with MD appointments.    Pertinent History  TIA, CKD, CAD, HTN, Hx of renal cancer.     Patient Stated Goals  To be able to walk safer, and move in and out of bed easier.        working on pivoting at hips when sitting to get shoulders over knees keeping neck stabilized x 10 minutes. ( probably 30 reps )   [facility] Adult PT Treatment/Exercise - 11/08/18 0001      Ambulation/Gait   Gait Comments  working on walking tall with longer steps.       Exercises   Exercises  Lumbar      Lumbar Exercises: Stretches   Passive Hamstring Stretch  2 reps;30 seconds    Single Knee to Chest  Stretch  2 reps;20 seconds      Lumbar Exercises: Standing   Other Standing Lumbar Exercises  wall arch x 5; B UE flexion with back against the wall x 10       Lumbar Exercises: Seated   Sit to Stand  10 reps   slight 2 hands held assistance     Lumbar Exercises: Supine   Bridge  10 reps      Lumbar Exercises: Sidelying   Hip Abduction  10 reps B      Balance:  Narrow base of support with head turns keeping shoulders over hips not behind  Side stepping x 2 Rt        PT Short Term Goals - 10/30/18 1530      PT SHORT TERM GOAL #1   Title  Pt to be able to get in and out of bed with ease.     Time  3    Period  Weeks    Status  On-going      PT SHORT TERM GOAL #2   Title  PT LE strength to be increased 1/2 grade to allow pt to come sit to stand with UE assist with increased ease.     Time  3    Period  Weeks    Status  On-going      PT SHORT TERM GOAL #3   Title  PT back pain to decrease to no greater than a 4/10 to allow pt to walk with his quad cane for 5 minutes without increased pain.     Time  3    Period  Weeks    Status  On-going      PT SHORT TERM GOAL #4   Title  PT to be able to single leg stance on both LE for 5 seconds to decrease risk of falling.     Time  3    Period  Weeks    Status  On-going        PT Long Term Goals - 10/30/18 1530      PT LONG TERM GOAL #1   Title  PT LE strength to be increased by one grade to allow pt to come sit to stand without having to use his UE>  Time  6    Period  Weeks    Status  On-going      PT LONG TERM GOAL #2   Title  PT back pain to be no greater than a 2/10 to allow pt to walk with his cane painfree for ten minutes to be able to complete household tasks.     Time  6    Period  Weeks    Status  On-going      PT LONG TERM GOAL #3   Title  PT ROM of hip and back to improve to allow pt to be able to don shoes and socks without pain     Time  6    Period  Weeks    Status  On-going      PT LONG  TERM GOAL #4   Title  PT to be able to single leg balance on both LE for 15 seconds to allow pt to feel confident walking on uneven ground.     Time  6    Period  Weeks    Status  On-going            Plan - 11/08/18 1137    Clinical Impression Statement  PT needs constant verbal and manual cuing to keep posture in as appropriate postition as possible.  Spend extensive time attempting to teach pt how to pivot at the hips to get his shoulders over his hips for sit to stand as pt bends at his thoracic area.     Rehab Potential  Good    PT Frequency  3x / week    PT Duration  6 weeks    PT Treatment/Interventions  ADLs/Self Care Home Management;Patient/family education;Therapeutic exercise;Therapeutic activities;Balance training;Functional mobility training;Stair training;Gait training;DME Instruction;Manual techniques    PT Next Visit Plan   Continue gait and transitional motions.     PT Home Exercise Plan  sitting tall with scapular retraction, decompression 1-3; 1/20:  bend knee lift , clams; 11/03/2018 -  decompression exercises 1-5 and 1-4 w/ RTB.        Patient will benefit from skilled therapeutic intervention in order to improve the following deficits and impairments:  Abnormal gait, Decreased activity tolerance, Decreased balance, Decreased mobility, Decreased strength, Decreased range of motion, Difficulty walking, Postural dysfunction, Pain, Improper body mechanics  Visit Diagnosis: Acute bilateral low back pain without sciatica  Difficulty in walking, not elsewhere classified  Unsteadiness on feet     Problem List Patient Active Problem List   Diagnosis Date Noted  . Goals of care, counseling/discussion   . Acute blood loss anemia 09/28/2016  . CAD (coronary artery disease)   . Pancytopenia ([place])   . Epistaxis, recurrent   . Hyperuricemia   . 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)   . AKI (acute kidney injury) ([place])   . Troponin level elevated   . Leukocytosis    . Lobar pneumonia, unspecified organism ([place])   . Acute on chronic kidney failure ([place]) 09/04/2016  . Acute on chronic systolic CHF (congestive heart failure) ([place]) 09/04/2016  . Thrombocytopenia ([place]) 09/04/2016  . Cellulitis 09/04/2016  . Chronic combined systolic and diastolic CHF (congestive heart failure) ([place]) 09/04/2016  . Paroxysmal atrial fibrillation ([facility])   . Cricopharyngeal dysphagia 11/06/2015  . Esophageal dysphagia 11/06/2015  . Carotid disease, bilateral ([place]) 12/05/2014  . TIA (transient ischemic attack) 12/04/2014  . MGUS (monoclonal gammopathy of unknown significance) 03/29/2011  . Renal cancer ([place]) 03/29/2011    Class: Diagnosis of  .  Shortness of breath 12/24/2010  . EDEMA 11/17/2010  . HEADACHE 10/28/2009  . Coronary artery disease due to lipid rich plaque 09/25/2009  . NOSEBLEED 09/22/2009  . Hypothyroidism, acquired 06/29/2009  . DYSLIPIDEMIA 06/29/2009  . Essential hypertension 06/29/2009  . CHEST PAIN 06/29/2009   Rayetta Humphrey, PT CLT [phone] 11/08/2018, 11:41 AM  [facility] [address] [place], [place], [address] Phone: [phone]   Fax:  [phone]  Name: BERTRUM HELMSTETTER MRN: [id number] Date of Birth: 11/03/1924

## 2018-11-10 ENCOUNTER — Encounter

## 2018-11-10 ENCOUNTER — Ambulatory Visit (HOSPITAL_COMMUNITY): Payer: Medicare Other | Admitting: Physical Therapy

## 2018-11-10 DIAGNOSIS — R262 Difficulty in walking, not elsewhere classified: Secondary | ICD-10-CM | POA: Diagnosis not present

## 2018-11-10 DIAGNOSIS — M545 Low back pain, unspecified: Secondary | ICD-10-CM

## 2018-11-10 DIAGNOSIS — R2681 Unsteadiness on feet: Secondary | ICD-10-CM

## 2018-11-10 NOTE — Therapy (Signed)
[facility] [address] [place], [place], [address] Phone: [phone]   Fax:  [phone]  Physical Therapy Treatment  Patient Details  Name: Brian Mcmillan MRN: [id number] Date of Birth: 1925-09-05 Referring Provider (PT): Everardo All   Encounter Date: 11/10/2018  PT End of Session - 11/10/18 1406    Visit Number  8    Number of Visits  18    Date for PT Re-Evaluation  12/04/18    Authorization Type  Medicare A & B    Authorization Time Period  1/13-->12/04/18    Authorization - Visit Number  8    Authorization - Number of Visits  10    PT Start Time  1300    PT Stop Time  1345    PT Time Calculation (min)  45 min    Activity Tolerance  Patient tolerated treatment well    Behavior During Therapy  [facility] for tasks assessed/performed       Past Medical History:  Diagnosis Date  . Abdominal mass 2008   chronic inflammatory mass of the mesentary Dr [name]  . 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 Anemia   . Anginal pain ([place])   . CAD (coronary artery disease)    cath 09/23/09 70% Dx1 (diffuse disease), 40% Cx, Large RCA 80-90% with heavy calcification / cath 06/2010 no change tiht RCA still best to treat medially with nosebleeds  . Chest pain    myoview 2006 no ischemia/ myoview 2009 no ischemia  . CHF (congestive heart failure) ([place])   . CKD (chronic kidney disease)   . COPD (chronic obstructive pulmonary disease) ([place])   . Dyslipidemia   . GERD (gastroesophageal reflux disease)   . Gout   . History of bleeding ulcers    "had 13 back in the early 1980s" (09/28/2016)  . History of blood transfusion 09/28/2016  . Hypertension    no RAS  . Hypothyroidism   . Kidney mass 2008   radiofrequency ablation at [facility]  . Left ventricular dysfunction    myoview 2006 normal / myoview 2009 normal / EF 60% cath 09/2009, EF 60% cath 06/2010  . Memory impairment    memory decreasing 06/2010  . Myocardial infarction  ([place]) <2016X 1; 2016   "@ [facility]; @ [facility]"  . Nosebleed "frequently since ~ 1966"   significant, can not take ASA  . Pneumonia 08/2016-09/2016  . Rash Sept 2011   rash on buttocks, hospitalized hydralazine stopped but then restarted w/o return of rash  . Renal cancer ([place])   . Skin cancer of scalp   . Systolic click    no mitral valve prolapse  . Type II diabetes mellitus ([place])     Past Surgical History:  Procedure Laterality Date  . BACK SURGERY    . BONE MARROW ASPIRATION     X2  . BONE MARROW BIOPSY     X2  . CARDIAC CATHETERIZATION    . CATARACT EXTRACTION W/ INTRAOCULAR LENS  IMPLANT, BILATERAL Bilateral   . COLONOSCOPY N/A 03/07/2013   Procedure: COLONOSCOPY;  Surgeon: [name], MD;  [facility];  Service: Endoscopy;  Laterality: N/A;  830-moved to [address] to notify pt  . COLONOSCOPY  01/2013  . LUMBAR DISC SURGERY     "L4-5"  . LYMPH NODE DISSECTION Right     abdomen  . LYMPH NODE DISSECTION Left    robotic laser @ [facility]  on left side of stomach  .  MOHS SURGERY     scalp; "22 stitches around; 9 stitches down; went all the way to the skull"  . NASAL SEPTUM SURGERY    . RFA RIGHT KIDNEY    . TONSILLECTOMY  1933    There were no vitals filed for this visit.  Subjective Assessment - 11/10/18 1337    Subjective  Pt has been working on his HEP at home.  Has no pain.  Notices that he gets off balance easily     Pertinent History  TIA, CKD, CAD, HTN, Hx of renal cancer.     Patient Stated Goals  To be able to walk safer, and move in and out of bed easier.     Currently in Pain?  No/denies                       [facility] Adult PT Treatment/Exercise - 11/10/18 0001      Exercises   Exercises  Lumbar      Lumbar Exercises: Standing   Other Standing Lumbar Exercises  wall arch x 5; wall lift off x 5; B UE flexion with back against the wall x 10     Other Standing Lumbar Exercises  Walking back hall head high, in 24 steps x 4       Lumbar  Exercises: Seated   Other Seated Lumbar Exercises  sitting as tall as possible x 10 seconds x5; scapular retraction x 10     Other Seated Lumbar Exercises  cervical/scapular retraction x 5 reps           Balance Exercises - 11/10/18 1338      Balance Exercises: Standing   Tandem Stance  Eyes open;Upper extremity support 2;2 reps;30 secs    Sidestepping  2 reps   red t-band    Sit to Stand Time  10          PT Short Term Goals - 10/30/18 1530      PT SHORT TERM GOAL #1   Title  Pt to be able to get in and out of bed with ease.     Time  3    Period  Weeks    Status  On-going      PT SHORT TERM GOAL #2   Title  PT LE strength to be increased 1/2 grade to allow pt to come sit to stand with UE assist with increased ease.     Time  3    Period  Weeks    Status  On-going      PT SHORT TERM GOAL #3   Title  PT back pain to decrease to no greater than a 4/10 to allow pt to walk with his quad cane for 5 minutes without increased pain.     Time  3    Period  Weeks    Status  On-going      PT SHORT TERM GOAL #4   Title  PT to be able to single leg stance on both LE for 5 seconds to decrease risk of falling.     Time  3    Period  Weeks    Status  On-going        PT Long Term Goals - 10/30/18 1530      PT LONG TERM GOAL #1   Title  PT LE strength to be increased by one grade to allow pt to come sit to stand without having to use his UE>  Time  6    Period  Weeks    Status  On-going      PT LONG TERM GOAL #2   Title  PT back pain to be no greater than a 2/10 to allow pt to walk with his cane painfree for ten minutes to be able to complete household tasks.     Time  6    Period  Weeks    Status  On-going      PT LONG TERM GOAL #3   Title  PT ROM of hip and back to improve to allow pt to be able to don shoes and socks without pain     Time  6    Period  Weeks    Status  On-going      PT LONG TERM GOAL #4   Title  PT to be able to single leg balance on both  LE for 15 seconds to allow pt to feel confident walking on uneven ground.     Time  6    Period  Weeks    Status  On-going            Plan - 11/10/18 1407    Clinical Impression Statement  Pt able to go sit to stand without UE assist.  Added walking instructions and tandem stance with minimal physical assistance needed.     Rehab Potential  Good    PT Frequency  3x / week    PT Duration  6 weeks    PT Treatment/Interventions  ADLs/Self Care Home Management;Patient/family education;Therapeutic exercise;Therapeutic activities;Balance training;Functional mobility training;Stair training;Gait training;DME Instruction;Manual techniques    PT Next Visit Plan   Continue gait and transitional motions.     PT Home Exercise Plan  sitting tall with scapular retraction, decompression 1-3; 1/20:  bend knee lift , clams; 11/03/2018 -  decompression exercises 1-5 and 1-4 w/ RTB.        Patient will benefit from skilled therapeutic intervention in order to improve the following deficits and impairments:  Abnormal gait, Decreased activity tolerance, Decreased balance, Decreased mobility, Decreased strength, Decreased range of motion, Difficulty walking, Postural dysfunction, Pain, Improper body mechanics  Visit Diagnosis: Acute bilateral low back pain without sciatica  Difficulty in walking, not elsewhere classified  Unsteadiness on feet     Problem List Patient Active Problem List   Diagnosis Date Noted  . Goals of care, counseling/discussion   . Acute blood loss anemia 09/28/2016  . CAD (coronary artery disease)   . Pancytopenia ([place])   . Epistaxis, recurrent   . Hyperuricemia   . Acute myeloid leukemia in adult [facility])   . AKI (acute kidney injury) ([place])   . Troponin level elevated   . Leukocytosis   . Lobar pneumonia, unspecified organism ([place])   . Acute on chronic kidney failure ([place]) 09/04/2016  . Acute on chronic systolic CHF (congestive heart failure) ([place]) 09/04/2016  .  Thrombocytopenia ([place]) 09/04/2016  . Cellulitis 09/04/2016  . Chronic combined systolic and diastolic CHF (congestive heart failure) ([place]) 09/04/2016  . Paroxysmal atrial fibrillation ([facility])   . Cricopharyngeal dysphagia 11/06/2015  . Esophageal dysphagia 11/06/2015  . Carotid disease, bilateral ([place]) 12/05/2014  . TIA (transient ischemic attack) 12/04/2014  . MGUS (monoclonal gammopathy of unknown significance) 03/29/2011  . Renal cancer ([place]) 03/29/2011    Class: Diagnosis of  . Shortness of breath 12/24/2010  . EDEMA 11/17/2010  . HEADACHE 10/28/2009  . Coronary artery disease due to lipid rich plaque 09/25/2009  .  NOSEBLEED 09/22/2009  . Hypothyroidism, acquired 06/29/2009  . DYSLIPIDEMIA 06/29/2009  . Essential hypertension 06/29/2009  . CHEST PAIN 06/29/2009    Rayetta Humphrey, PT CLT [phone] 11/10/2018, 2:09 PM  [facility] [address] [place], [place], [address] Phone: [phone]   Fax:  [phone]  Name: Brian Mcmillan MRN: [id number] Date of Birth: [birth year]

## 2018-11-13 ENCOUNTER — Encounter (HOSPITAL_COMMUNITY): Payer: Medicare Other

## 2018-11-15 ENCOUNTER — Ambulatory Visit (HOSPITAL_COMMUNITY): Payer: Medicare Other | Attending: Oncology | Admitting: Physical Therapy

## 2018-11-15 DIAGNOSIS — R2681 Unsteadiness on feet: Secondary | ICD-10-CM | POA: Insufficient documentation

## 2018-11-15 DIAGNOSIS — R262 Difficulty in walking, not elsewhere classified: Secondary | ICD-10-CM | POA: Insufficient documentation

## 2018-11-15 DIAGNOSIS — M545 Low back pain, unspecified: Secondary | ICD-10-CM

## 2018-11-15 NOTE — Therapy (Signed)
[facility] [address] [place], [place], [address] Phone: [phone]   Fax:  [phone]  Physical Therapy Treatment  Patient Details  Name: Brian Mcmillan MRN: [id number] Date of Birth: 24-Jan-1925 Referring Provider (PT): Everardo All   Encounter Date: 11/15/2018  PT End of Session - 11/15/18 1610    Visit Number  9    Number of Visits  18    Date for PT Re-Evaluation  12/04/18    Authorization Type  Medicare A & B    Authorization Time Period  1/13-->12/04/18    Authorization - Visit Number  9    Authorization - Number of Visits  10    PT Start Time  1435    PT Stop Time  1516    PT Time Calculation (min)  41 min    Activity Tolerance  Patient tolerated treatment well    Behavior During Therapy  [facility] for tasks assessed/performed       Past Medical History:  Diagnosis Date  . Abdominal mass 2008   chronic inflammatory mass of the mesentary Dr [name]  . 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 Anemia   . Anginal pain ([place])   . CAD (coronary artery disease)    cath 09/23/09 70% Dx1 (diffuse disease), 40% Cx, Large RCA 80-90% with heavy calcification / cath 06/2010 no change tiht RCA still best to treat medially with nosebleeds  . Chest pain    myoview 2006 no ischemia/ myoview 2009 no ischemia  . CHF (congestive heart failure) ([place])   . CKD (chronic kidney disease)   . COPD (chronic obstructive pulmonary disease) ([place])   . Dyslipidemia   . GERD (gastroesophageal reflux disease)   . Gout   . History of bleeding ulcers    "had 13 back in the early 1980s" (09/28/2016)  . History of blood transfusion 09/28/2016  . Hypertension    no RAS  . Hypothyroidism   . Kidney mass 2008   radiofrequency ablation at [facility]  . Left ventricular dysfunction    myoview 2006 normal / myoview 2009 normal / EF 60% cath 09/2009, EF 60% cath 06/2010  . Memory impairment    memory decreasing 06/2010  . Myocardial infarction  ([place]) <2016X 1; 2016   "@ [facility]; @ [facility]"  . Nosebleed "frequently since ~ 1966"   significant, can not take ASA  . Pneumonia 08/2016-09/2016  . Rash Sept 2011   rash on buttocks, hospitalized hydralazine stopped but then restarted w/o return of rash  . Renal cancer ([place])   . Skin cancer of scalp   . Systolic click    no mitral valve prolapse  . Type II diabetes mellitus ([place])     Past Surgical History:  Procedure Laterality Date  . BACK SURGERY    . BONE MARROW ASPIRATION     X2  . BONE MARROW BIOPSY     X2  . CARDIAC CATHETERIZATION    . CATARACT EXTRACTION W/ INTRAOCULAR LENS  IMPLANT, BILATERAL Bilateral   . COLONOSCOPY N/A 03/07/2013   Procedure: COLONOSCOPY;  Surgeon: [name], MD;  [facility];  Service: Endoscopy;  Laterality: N/A;  830-moved to [address] to notify pt  . COLONOSCOPY  01/2013  . LUMBAR DISC SURGERY     "L4-5"  . LYMPH NODE DISSECTION Right     abdomen  . LYMPH NODE DISSECTION Left    robotic laser @ [facility]  on left side of stomach  .  MOHS SURGERY     scalp; "22 stitches around; 9 stitches down; went all the way to the skull"  . NASAL SEPTUM SURGERY    . RFA RIGHT KIDNEY    . TONSILLECTOMY  1933    There were no vitals filed for this visit.  Subjective Assessment - 11/15/18 1455    Subjective  Pt sates he's doing well today, doing his HEP.    Currently in Pain?  No/denies                       [facility] Adult PT Treatment/Exercise - 11/15/18 0001      Lumbar Exercises: Standing   Other Standing Lumbar Exercises  wall arch x 5; wall lift off x 5; B UE flexion with back against the wall x 10       Lumbar Exercises: Seated   Other Seated Lumbar Exercises  sitting as tall as possible x 10 seconds x5; scapular retraction x 10           Balance Exercises - 11/15/18 1738      Balance Exercises: Standing   Tandem Stance  Eyes open;Upper extremity support 2;2 reps;30 secs    SLS with Vectors  5 reps;Upper extremity  assist 1;Solid surface   5 second holds   Tandem Gait  2 reps    Retro Gait  2 reps    Sidestepping  2 reps;Theraband   RTB   Sit to Stand Time  10          PT Short Term Goals - 10/30/18 1530      PT SHORT TERM GOAL #1   Title  Pt to be able to get in and out of bed with ease.     Time  3    Period  Weeks    Status  On-going      PT SHORT TERM GOAL #2   Title  PT LE strength to be increased 1/2 grade to allow pt to come sit to stand with UE assist with increased ease.     Time  3    Period  Weeks    Status  On-going      PT SHORT TERM GOAL #3   Title  PT back pain to decrease to no greater than a 4/10 to allow pt to walk with his quad cane for 5 minutes without increased pain.     Time  3    Period  Weeks    Status  On-going      PT SHORT TERM GOAL #4   Title  PT to be able to single leg stance on both LE for 5 seconds to decrease risk of falling.     Time  3    Period  Weeks    Status  On-going        PT Long Term Goals - 10/30/18 1530      PT LONG TERM GOAL #1   Title  PT LE strength to be increased by one grade to allow pt to come sit to stand without having to use his UE>     Time  6    Period  Weeks    Status  On-going      PT LONG TERM GOAL #2   Title  PT back pain to be no greater than a 2/10 to allow pt to walk with his cane painfree for ten minutes to be able to complete household tasks.  Time  6    Period  Weeks    Status  On-going      PT LONG TERM GOAL #3   Title  PT ROM of hip and back to improve to allow pt to be able to don shoes and socks without pain     Time  6    Period  Weeks    Status  On-going      PT LONG TERM GOAL #4   Title  PT to be able to single leg balance on both LE for 15 seconds to allow pt to feel confident walking on uneven ground.     Time  6    Period  Weeks    Status  On-going            Plan - 11/15/18 1734    Clinical Impression Statement  continued with primary focus on stability and LE  strengthening.  Pt requires extra cues and instruction to complete and continue to perform therex correctly throughout the reps.   Constant postural cues with balance activites today as tends to round shoulders and look down.  Progressed with veectors, tandem and retro gait today.  Pt with minor LOB with min assist to correct.  2 short seated rest breaks required during session today.      Rehab Potential  Good    PT Frequency  3x / week    PT Duration  6 weeks    PT Treatment/Interventions  ADLs/Self Care Home Management;Patient/family education;Therapeutic exercise;Therapeutic activities;Balance training;Functional mobility training;Stair training;Gait training;DME Instruction;Manual techniques    PT Next Visit Plan   Continue gait and progression of transitional motions/balance.     PT Home Exercise Plan  sitting tall with scapular retraction, decompression 1-3; 1/20:  bend knee lift , clams; 11/03/2018 -  decompression exercises 1-5 and 1-4 w/ RTB.        Patient will benefit from skilled therapeutic intervention in order to improve the following deficits and impairments:  Abnormal gait, Decreased activity tolerance, Decreased balance, Decreased mobility, Decreased strength, Decreased range of motion, Difficulty walking, Postural dysfunction, Pain, Improper body mechanics  Visit Diagnosis: Acute bilateral low back pain without sciatica  Difficulty in walking, not elsewhere classified  Unsteadiness on feet     Problem List Patient Active Problem List   Diagnosis Date Noted  . Goals of care, counseling/discussion   . Acute blood loss anemia 09/28/2016  . CAD (coronary artery disease)   . Pancytopenia ([place])   . Epistaxis, recurrent   . Hyperuricemia   . Acute myeloid leukemia in adult [facility])   . AKI (acute kidney injury) ([place])   . Troponin level elevated   . Leukocytosis   . Lobar pneumonia, unspecified organism ([place])   . Acute on chronic kidney failure ([place]) 09/04/2016  . Acute  on chronic systolic CHF (congestive heart failure) ([place]) 09/04/2016  . Thrombocytopenia ([place]) 09/04/2016  . Cellulitis 09/04/2016  . Chronic combined systolic and diastolic CHF (congestive heart failure) ([place]) 09/04/2016  . Paroxysmal atrial fibrillation ([facility])   . Cricopharyngeal dysphagia 11/06/2015  . Esophageal dysphagia 11/06/2015  . Carotid disease, bilateral ([place]) 12/05/2014  . TIA (transient ischemic attack) 12/04/2014  . MGUS (monoclonal gammopathy of unknown significance) 03/29/2011  . Renal cancer ([place]) 03/29/2011    Class: Diagnosis of  . Shortness of breath 12/24/2010  . EDEMA 11/17/2010  . HEADACHE 10/28/2009  . Coronary artery disease due to lipid rich plaque 09/25/2009  . NOSEBLEED 09/22/2009  . Hypothyroidism, acquired 06/29/2009  .  DYSLIPIDEMIA 06/29/2009  . Essential hypertension 06/29/2009  . CHEST PAIN 06/29/2009   Teena Irani, PTA/CLT [phone]  Teena Irani 11/15/2018, 5:39 PM  [facility] [address] [place], [place], [address] Phone: [phone]   Fax:  [phone]  Name: SHIMSHON NARULA MRN: [id number] Date of Birth: 1925/02/22

## 2018-11-17 ENCOUNTER — Ambulatory Visit (HOSPITAL_COMMUNITY): Payer: Medicare Other

## 2018-11-17 ENCOUNTER — Encounter (HOSPITAL_COMMUNITY): Payer: Self-pay

## 2018-11-17 DIAGNOSIS — M545 Low back pain, unspecified: Secondary | ICD-10-CM

## 2018-11-17 DIAGNOSIS — R2681 Unsteadiness on feet: Secondary | ICD-10-CM | POA: Diagnosis not present

## 2018-11-17 DIAGNOSIS — R262 Difficulty in walking, not elsewhere classified: Secondary | ICD-10-CM

## 2018-11-17 NOTE — Therapy (Addendum)
[facility] [address] [place], [place], [address] Phone: [phone]   Fax:  [phone]  Physical Therapy Treatment  Patient Details  Name: Brian Mcmillan MRN: [id number] Date of Birth: 1925/05/04 Referring Provider (PT): Everardo All   Encounter Date: 11/17/2018 Progress Note Reporting Period 10/23/2018  to 11/17/2018  See note below for Objective Data and Assessment of Progress/Goals.      PT End of Session - 11/17/18 1438    Visit Number  10    Number of Visits  18    Date for PT Re-Evaluation  12/04/18    Authorization Type  Medicare A & B    Authorization Time Period  1/13-->12/04/18    Authorization - Visit Number  10    Authorization - Number of Visits  20    PT Start Time  1351    PT Stop Time  1430    PT Time Calculation (min)  39 min    Equipment Utilized During Treatment  Gait belt    Activity Tolerance  Patient tolerated treatment well    Behavior During Therapy  WFL for tasks assessed/performed       Past Medical History:  Diagnosis Date  . Abdominal mass 2008   chronic inflammatory mass of the mesentary Dr [name]  . 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 Anemia   . Anginal pain ([place])   . CAD (coronary artery disease)    cath 09/23/09 70% Dx1 (diffuse disease), 40% Cx, Large RCA 80-90% with heavy calcification / cath 06/2010 no change tiht RCA still best to treat medially with nosebleeds  . Chest pain    myoview 2006 no ischemia/ myoview 2009 no ischemia  . CHF (congestive heart failure) ([place])   . CKD (chronic kidney disease)   . COPD (chronic obstructive pulmonary disease) ([place])   . Dyslipidemia   . GERD (gastroesophageal reflux disease)   . Gout   . History of bleeding ulcers    "had 13 back in the early 1980s" (09/28/2016)  . History of blood transfusion 09/28/2016  . Hypertension    no RAS  . Hypothyroidism   . Kidney mass 2008   radiofrequency ablation at [facility]  . Left  ventricular dysfunction    myoview 2006 normal / myoview 2009 normal / EF 60% cath 09/2009, EF 60% cath 06/2010  . Memory impairment    memory decreasing 06/2010  . Myocardial infarction ([place]) <2016X 1; 2016   "@ [facility]; @ [facility]"  . Nosebleed "frequently since ~ 1966"   significant, can not take ASA  . Pneumonia 08/2016-09/2016  . Rash Sept 2011   rash on buttocks, hospitalized hydralazine stopped but then restarted w/o return of rash  . Renal cancer ([place])   . Skin cancer of scalp   . Systolic click    no mitral valve prolapse  . Type II diabetes mellitus ([place])     Past Surgical History:  Procedure Laterality Date  . BACK SURGERY    . BONE MARROW ASPIRATION     X2  . BONE MARROW BIOPSY     X2  . CARDIAC CATHETERIZATION    . CATARACT EXTRACTION W/ INTRAOCULAR LENS  IMPLANT, BILATERAL Bilateral   . COLONOSCOPY N/A 03/07/2013   Procedure: COLONOSCOPY;  Surgeon: [name], MD;  [facility];  Service: Endoscopy;  Laterality: N/A;  830-moved to 60 Ann to notify pt  . COLONOSCOPY  01/2013  . LUMBAR DISC SURGERY     "  L4-5"  . LYMPH NODE DISSECTION Right     abdomen  . LYMPH NODE DISSECTION Left    robotic laser @ [facility]  on left side of stomach  . MOHS SURGERY     scalp; "22 stitches around; 9 stitches down; went all the way to the skull"  . NASAL SEPTUM SURGERY    . RFA RIGHT KIDNEY    . TONSILLECTOMY  1933    There were no vitals filed for this visit.  Subjective Assessment - 11/17/18 1406    Subjective  Pt stated he doing good today, reoprts he has been unable to do HEP due to too busy    How long can you sit comfortably?  no problem     How long can you stand comfortably?  Able to stand for 10-15 minutes prior fatigue and LBP (was five-ten  minutes)    How long can you walk comfortably?  Able to walk with QC for a 1-2 blocks in about 10-15 minutes (PT walks with a quad cane normally in the home only,          [facility] PT Assessment - 11/17/18 0001       Assessment   Medical Diagnosis  Back pain with unsteadiness on feet    Referring Provider (PT)  Everardo All    Onset Date/Surgical Date  07/11/18    Next MD Visit  11/02/2018    Prior Therapy  none      Precautions   Precautions  Fall      Observation/Other Assessments   Focus on Therapeutic Outcomes (FOTO)   52   was 38     Strength   Right Hip Flexion  3+/5   was 3/5   Right Hip Extension  3/5   was 3-/5   Right Hip ABduction  4-/5    Left Hip Flexion  3+/5   was 3/5   Left Hip Extension  3-/5   was 3-/5   Left Hip ABduction  3+/5    Right Knee Flexion  4-/5    Right Knee Extension  --   was 4/5   Left Knee Flexion  3+/5    Left Knee Extension  --   was 3+/5   Right Ankle Dorsiflexion  4-/5   was 3+/5   Left Ankle Dorsiflexion  4-/5   was 3+                  [facility] Adult PT Treatment/Exercise - 11/17/18 0001      Lumbar Exercises: Standing   Other Standing Lumbar Exercises  wall arch x 5; wall lift off x 5; B UE flexion with back against the wall x 10     Other Standing Lumbar Exercises  SLS 3" max BLE      Lumbar Exercises: Seated   Other Seated Lumbar Exercises  sitting as tall as possible x 10 seconds x5; scapular retraction x 10           Balance Exercises - 11/17/18 1435      Balance Exercises: Standing   SLS  Eyes open;5 reps   3" max BLE         PT Short Term Goals - 11/17/18 1405      PT SHORT TERM GOAL #1   Title  Pt to be able to get in and out of bed with ease.     Baseline  11/17/18:  Reports ability and able to demonstrate getting in/out of bed wiht  ease    Status  Achieved      PT SHORT TERM GOAL #2   Title  PT LE strength to be increased 1/2 grade to allow pt to come sit to stand with UE assist with increased ease.     Baseline  11/17/18: see MMT      PT SHORT TERM GOAL #3   Title  PT back pain to decrease to no greater than a 4/10 to allow pt to walk with his quad cane for 5 minutes without increased pain.      Baseline  11/17/18:  Reports ability to walk for 5 minutes wiht no pain    Status  Achieved      PT SHORT TERM GOAL #4   Title  PT to be able to single leg stance on both LE for 5 seconds to decrease risk of falling.         PT Long Term Goals - 10/30/18 1530      PT LONG TERM GOAL #1   Title  PT LE strength to be increased by one grade to allow pt to come sit to stand without having to use his UE>     Time  6    Period  Weeks    Status  On-going      PT LONG TERM GOAL #2   Title  PT back pain to be no greater than a 2/10 to allow pt to walk with his cane painfree for ten minutes to be able to complete household tasks.     Time  6    Period  Weeks    Status  On-going      PT LONG TERM GOAL #3   Title  PT ROM of hip and back to improve to allow pt to be able to don shoes and socks without pain     Time  6    Period  Weeks    Status  On-going      PT LONG TERM GOAL #4   Title  PT to be able to single leg balance on both LE for 15 seconds to allow pt to feel confident walking on uneven ground.     Time  6    Period  Weeks    Status  On-going            Plan - 11/17/18 1438    Clinical Impression Statement  Reviewed goals, MMT, balance testings and FOTO for self perceived functional abilities wiht the following findings:  Pt reports he has been busy going from MD apts and assistance with wife so has not been compliant wiht HEP, pt educated on importance of compliance for maximal benefits.  Pt verbalized and demonstrated getting in and out of bed with ease.  MMT complete with some gains in strength, does continue to demonstrate weakness with all LE muscualture.  Pt reports no pain for several weeks now, and able to ambulate for 10-15 minutes prior fatigue.  Does reports some LBP and fatigue with gait for further distances.  Improved FOTO to 52% was 38% initial eval.      Rehab Potential  Good    PT Frequency  3x / week    PT Duration  6 weeks    PT Treatment/Interventions   ADLs/Self Care Home Management;Patient/family education;Therapeutic exercise;Therapeutic activities;Balance training;Functional mobility training;Stair training;Gait training;DME Instruction;Manual techniques    PT Next Visit Plan   Continue gait and progression of transitional motions/balance.     PT  Home Exercise Plan  sitting tall with scapular retraction, decompression 1-3; 1/20:  bend knee lift , clams; 11/03/2018 -  decompression exercises 1-5 and 1-4 w/ RTB.        Patient will benefit from skilled therapeutic intervention in order to improve the following deficits and impairments:  Abnormal gait, Decreased activity tolerance, Decreased balance, Decreased mobility, Decreased strength, Decreased range of motion, Difficulty walking, Postural dysfunction, Pain, Improper body mechanics  Visit Diagnosis: Acute bilateral low back pain without sciatica  Difficulty in walking, not elsewhere classified  Unsteadiness on feet     Problem List Patient Active Problem List   Diagnosis Date Noted  . Goals of care, counseling/discussion   . Acute blood loss anemia 09/28/2016  . CAD (coronary artery disease)   . Pancytopenia ([place])   . Epistaxis, recurrent   . Hyperuricemia   . Acute myeloid leukemia in adult [facility])   . AKI (acute kidney injury) ([place])   . Troponin level elevated   . Leukocytosis   . Lobar pneumonia, unspecified organism ([place])   . Acute on chronic kidney failure ([place]) 09/04/2016  . Acute on chronic systolic CHF (congestive heart failure) ([place]) 09/04/2016  . Thrombocytopenia ([place]) 09/04/2016  . Cellulitis 09/04/2016  . Chronic combined systolic and diastolic CHF (congestive heart failure) ([place]) 09/04/2016  . Paroxysmal atrial fibrillation ([facility])   . Cricopharyngeal dysphagia 11/06/2015  . Esophageal dysphagia 11/06/2015  . Carotid disease, bilateral ([place]) 12/05/2014  . TIA (transient ischemic attack) 12/04/2014  . MGUS (monoclonal gammopathy of unknown significance)  03/29/2011  . Renal cancer ([place]) 03/29/2011    Class: Diagnosis of  . Shortness of breath 12/24/2010  . EDEMA 11/17/2010  . HEADACHE 10/28/2009  . Coronary artery disease due to lipid rich plaque 09/25/2009  . NOSEBLEED 09/22/2009  . Hypothyroidism, acquired 06/29/2009  . DYSLIPIDEMIA 06/29/2009  . Essential hypertension 06/29/2009  . CHEST PAIN 06/29/2009   Ihor Austin, [place]; [place]  Rayetta Humphrey, PT CLT [phone] 11/17/2018, 2:47 PM  [facility] [address] [place], [place], [address] Phone: [phone]   Fax:  [phone]  Name: Brian Mcmillan MRN: [id number] Date of Birth: [birth year]

## 2018-11-20 ENCOUNTER — Ambulatory Visit (HOSPITAL_COMMUNITY): Payer: Medicare Other | Admitting: Physical Therapy

## 2018-11-20 DIAGNOSIS — R2681 Unsteadiness on feet: Secondary | ICD-10-CM | POA: Diagnosis not present

## 2018-11-20 DIAGNOSIS — R262 Difficulty in walking, not elsewhere classified: Secondary | ICD-10-CM | POA: Diagnosis not present

## 2018-11-20 DIAGNOSIS — M545 Low back pain, unspecified: Secondary | ICD-10-CM

## 2018-11-20 NOTE — Therapy (Signed)
[facility] [address] [place], [place], [address] Phone: [phone]   Fax:  [phone]  Physical Therapy Treatment  Patient Details  Name: Brian Mcmillan MRN: [id number] Date of Birth: Dec 10, 1924 Referring Provider (PT): Everardo All   Encounter Date: 11/20/2018  PT End of Session - 11/20/18 1346    Visit Number  11    Number of Visits  18    Date for PT Re-Evaluation  12/04/18    Authorization Type  Medicare A & B    Authorization Time Period  1/13-->12/04/18    Authorization - Visit Number  11    Authorization - Number of Visits  18    PT Start Time  1300    PT Stop Time  1345    PT Time Calculation (min)  45 min    Equipment Utilized During Treatment  Gait belt    Activity Tolerance  Patient tolerated treatment well    Behavior During Therapy  [facility] for tasks assessed/performed       Past Medical History:  Diagnosis Date  . Abdominal mass 2008   chronic inflammatory mass of the mesentary Dr [name]  . 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 Anemia   . Anginal pain ([place])   . CAD (coronary artery disease)    cath 09/23/09 70% Dx1 (diffuse disease), 40% Cx, Large RCA 80-90% with heavy calcification / cath 06/2010 no change tiht RCA still best to treat medially with nosebleeds  . Chest pain    myoview 2006 no ischemia/ myoview 2009 no ischemia  . CHF (congestive heart failure) ([place])   . CKD (chronic kidney disease)   . COPD (chronic obstructive pulmonary disease) ([place])   . Dyslipidemia   . GERD (gastroesophageal reflux disease)   . Gout   . History of bleeding ulcers    "had 13 back in the early 1980s" (09/28/2016)  . History of blood transfusion 09/28/2016  . Hypertension    no RAS  . Hypothyroidism   . Kidney mass 2008   [facility]frequency ablation at [facility]  . Left ventricular dysfunction    myoview 2006 normal / myoview 2009 normal / EF 60% cath 09/2009, EF 60% cath 06/2010  . Memory impairment    memory decreasing 06/2010  . Myocardial infarction ([place]) <2016X 1; 2016   "@ [facility]; @ [facility]"  . Nosebleed "frequently since ~ 1966"   significant, can not take ASA  . Pneumonia 08/2016-09/2016  . Rash Sept 2011   rash on buttocks, hospitalized hydralazine stopped but then restarted w/o return of rash  . Renal cancer ([place])   . Skin cancer of scalp   . Systolic click    no mitral valve prolapse  . Type II diabetes mellitus ([place])     Past Surgical History:  Procedure Laterality Date  . BACK SURGERY    . BONE MARROW ASPIRATION     X2  . BONE MARROW BIOPSY     X2  . CARDIAC CATHETERIZATION    . CATARACT EXTRACTION W/ INTRAOCULAR LENS  IMPLANT, BILATERAL Bilateral   . COLONOSCOPY N/A 03/07/2013   Procedure: COLONOSCOPY;  Surgeon: [name], MD;  Location: [facility] ENDO SUITE;  Service: Endoscopy;  Laterality: N/A;  830-moved to 79 Ann to notify pt  . COLONOSCOPY  01/2013  . LUMBAR DISC SURGERY     "L4-5"  . LYMPH NODE DISSECTION Right     abdomen  . LYMPH NODE DISSECTION Left    robotic  laser @ [facility]  on left side of stomach  . MOHS SURGERY     scalp; "22 stitches around; 9 stitches down; went all the way to the skull"  . NASAL SEPTUM SURGERY    . RFA RIGHT KIDNEY    . TONSILLECTOMY  1933    There were no vitals filed for this visit.  Subjective Assessment - 11/20/18 1301    Subjective  PT states that he walked this morning.  His back is doing much better.     How long can you sit comfortably?  no problem     How long can you stand comfortably?  Able to stand for 10-15 minutes prior fatigue and LBP (was five-ten  minutes)    How long can you walk comfortably?  Able to walk with QC for a 1-2 blocks in about 10-15 minutes (PT walks with a quad cane normally in the home only,     Currently in Pain?  No/denies                       [facility] Adult PT Treatment/Exercise - 11/20/18 0001      Lumbar Exercises: Stretches   Standing Side Bend  Right;Left;5 reps     Standing Extension  5 reps      Lumbar Exercises: Aerobic   [place] 3 level 2       Lumbar Exercises: Standing   Heel Raises  10 reps    Functional Squats  10 reps    Forward Lunge  10 reps    Scapular Retraction  10 reps;Theraband    Theraband Level (Scapular Retraction)  Level 3 (Green)    Row  Both;10 reps;Theraband    Theraband Level (Row)  Level 3 (Green)    Shoulder Extension  Strengthening;Both;10 reps    Other Standing Lumbar Exercises  SLS 3" max BLE          Balance Exercises - 11/20/18 1310      Balance Exercises: Standing   Tandem Stance  Eyes open;Upper extremity support 2;2 reps;30 secs    SLS  Eyes open;3 reps    Sidestepping  2 reps;Theraband   RTB   Marching Limitations  10          PT Short Term Goals - 11/17/18 1405      PT SHORT TERM GOAL #1   Title  Pt to be able to get in and out of bed with ease.     Baseline  11/17/18:  Reports ability and able to demonstrate getting in/out of bed wiht ease    Status  Achieved      PT SHORT TERM GOAL #2   Title  PT LE strength to be increased 1/2 grade to allow pt to come sit to stand with UE assist with increased ease.     Baseline  11/17/18: see MMT      PT SHORT TERM GOAL #3   Title  PT back pain to decrease to no greater than a 4/10 to allow pt to walk with his quad cane for 5 minutes without increased pain.     Baseline  11/17/18:  Reports ability to walk for 5 minutes wiht no pain    Status  Achieved      PT SHORT TERM GOAL #4   Title  PT to be able to single leg stance on both LE for 5 seconds to decrease risk of falling.  PT Long Term Goals - 10/30/18 1530      PT LONG TERM GOAL #1   Title  PT LE strength to be increased by one grade to allow pt to come sit to stand without having to use his UE>     Time  6    Period  Weeks    Status  On-going      PT LONG TERM GOAL #2   Title  PT back pain to be no greater than a 2/10 to allow pt to walk with his cane painfree for ten minutes  to be able to complete household tasks.     Time  6    Period  Weeks    Status  On-going      PT LONG TERM GOAL #3   Title  PT ROM of hip and back to improve to allow pt to be able to don shoes and socks without pain     Time  6    Period  Weeks    Status  On-going      PT LONG TERM GOAL #4   Title  PT to be able to single leg balance on both LE for 15 seconds to allow pt to feel confident walking on uneven ground.     Time  6    Period  Weeks    Status  On-going            Plan - 11/20/18 1346    Clinical Impression Statement  PT continues to progress with goals.  Started pt on postural t-band with both manual and verbal cuing needed for proper technique.  Pt continues to have difficulty with balance,     Rehab Potential  Good    PT Frequency  3x / week    PT Duration  6 weeks    PT Treatment/Interventions  ADLs/Self Care Home Management;Patient/family education;Therapeutic exercise;Therapeutic activities;Balance training;Functional mobility training;Stair training;Gait training;DME Instruction;Manual techniques    PT Next Visit Plan   Continue gait and progression of transitional motions/balance.     PT Home Exercise Plan  sitting tall with scapular retraction, decompression 1-3; 1/20:  bend knee lift , clams; 11/03/2018 -  decompression exercises 1-5 and 1-4 w/ RTB.        Patient will benefit from skilled therapeutic intervention in order to improve the following deficits and impairments:  Abnormal gait, Decreased activity tolerance, Decreased balance, Decreased mobility, Decreased strength, Decreased range of motion, Difficulty walking, Postural dysfunction, Pain, Improper body mechanics  Visit Diagnosis: Difficulty in walking, not elsewhere classified  Acute bilateral low back pain without sciatica  Unsteadiness on feet     Problem List Patient Active Problem List   Diagnosis Date Noted  . Goals of care, counseling/discussion   . Acute blood loss anemia  09/28/2016  . CAD (coronary artery disease)   . Pancytopenia ([place])   . Epistaxis, recurrent   . Hyperuricemia   . Acute myeloid leukemia in adult [facility])   . AKI (acute kidney injury) ([place])   . Troponin level elevated   . Leukocytosis   . Lobar pneumonia, unspecified organism ([place])   . Acute on chronic kidney failure ([place]) 09/04/2016  . Acute on chronic systolic CHF (congestive heart failure) ([place]) 09/04/2016  . Thrombocytopenia ([place]) 09/04/2016  . Cellulitis 09/04/2016  . Chronic combined systolic and diastolic CHF (congestive heart failure) ([place]) 09/04/2016  . Paroxysmal atrial fibrillation ([facility])   . Cricopharyngeal dysphagia 11/06/2015  . Esophageal dysphagia 11/06/2015  . Carotid  disease, bilateral ([place]) 12/05/2014  . TIA (transient ischemic attack) 12/04/2014  . MGUS (monoclonal gammopathy of unknown significance) 03/29/2011  . Renal cancer ([place]) 03/29/2011    Class: Diagnosis of  . Shortness of breath 12/24/2010  . EDEMA 11/17/2010  . HEADACHE 10/28/2009  . Coronary artery disease due to lipid rich plaque 09/25/2009  . NOSEBLEED 09/22/2009  . Hypothyroidism, acquired 06/29/2009  . DYSLIPIDEMIA 06/29/2009  . Essential hypertension 06/29/2009  . CHEST PAIN 06/29/2009    Rayetta Humphrey, PT CLT [phone] 11/20/2018, 1:48 PM  [facility] [address] [place], [place], [address] Phone: [phone]   Fax:  [phone]  Name: Brian Mcmillan MRN: [id number] Date of Birth: 12-22-1924

## 2018-11-22 ENCOUNTER — Encounter (HOSPITAL_COMMUNITY): Payer: Medicare Other | Admitting: Physical Therapy

## 2018-11-23 ENCOUNTER — Ambulatory Visit (HOSPITAL_COMMUNITY): Payer: Medicare Other | Admitting: Physical Therapy

## 2018-11-23 DIAGNOSIS — R2681 Unsteadiness on feet: Secondary | ICD-10-CM | POA: Diagnosis not present

## 2018-11-23 DIAGNOSIS — R262 Difficulty in walking, not elsewhere classified: Secondary | ICD-10-CM

## 2018-11-23 DIAGNOSIS — M545 Low back pain: Secondary | ICD-10-CM | POA: Diagnosis not present

## 2018-11-23 NOTE — Therapy (Signed)
[facility] [address] [place], [place], [address] Phone: [phone]   Fax:  [phone]  Physical Therapy Treatment  Patient Details  Name: Brian Mcmillan MRN: [id number] Date of Birth: 04/30/1925 Referring Provider (PT): Everardo All   Encounter Date: 11/23/2018  PT End of Session - 11/23/18 1514    Visit Number  12    Number of Visits  18    Date for PT Re-Evaluation  12/04/18    Authorization Type  Medicare A & B    Authorization Time Period  1/13-->12/04/18    Authorization - Visit Number  12    Authorization - Number of Visits  18    PT Start Time  0092    PT Stop Time  1520    PT Time Calculation (min)  47 min    Equipment Utilized During Treatment  Gait belt    Activity Tolerance  Patient tolerated treatment well    Behavior During Therapy  [facility] for tasks assessed/performed       Past Medical History:  Diagnosis Date  . Abdominal mass 2008   chronic inflammatory mass of the mesentary Dr [name]  . 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 Anemia   . Anginal pain ([place])   . CAD (coronary artery disease)    cath 09/23/09 70% Dx1 (diffuse disease), 40% Cx, Large RCA 80-90% with heavy calcification / cath 06/2010 no change tiht RCA still best to treat medially with nosebleeds  . Chest pain    myoview 2006 no ischemia/ myoview 2009 no ischemia  . CHF (congestive heart failure) ([place])   . CKD (chronic kidney disease)   . COPD (chronic obstructive pulmonary disease) ([place])   . Dyslipidemia   . GERD (gastroesophageal reflux disease)   . Gout   . History of bleeding ulcers    "had 13 back in the early 1980s" (09/28/2016)  . History of blood transfusion 09/28/2016  . Hypertension    no RAS  . Hypothyroidism   . Kidney mass 2008   radiofrequency ablation at [facility]  . Left ventricular dysfunction    myoview 2006 normal / myoview 2009 normal / EF 60% cath 09/2009, EF 60% cath 06/2010  . Memory impairment    memory decreasing 06/2010  . Myocardial infarction ([place]) <2016X 1; 2016   "@ [facility]; @ [facility]"  . Nosebleed "frequently since ~ 1966"   significant, can not take ASA  . Pneumonia 08/2016-09/2016  . Rash Sept 2011   rash on buttocks, hospitalized hydralazine stopped but then restarted w/o return of rash  . Renal cancer ([place])   . Skin cancer of scalp   . Systolic click    no mitral valve prolapse  . Type II diabetes mellitus ([place])     Past Surgical History:  Procedure Laterality Date  . BACK SURGERY    . BONE MARROW ASPIRATION     X2  . BONE MARROW BIOPSY     X2  . CARDIAC CATHETERIZATION    . CATARACT EXTRACTION W/ INTRAOCULAR LENS  IMPLANT, BILATERAL Bilateral   . COLONOSCOPY N/A 03/07/2013   Procedure: COLONOSCOPY;  Surgeon: [name], MD;  [facility];  Service: Endoscopy;  Laterality: N/A;  830-moved to 13 Ann to notify pt  . COLONOSCOPY  01/2013  . LUMBAR DISC SURGERY     "L4-5"  . LYMPH NODE DISSECTION Right     abdomen  . LYMPH NODE DISSECTION Left    robotic  laser @ [facility]  on left side of stomach  . MOHS SURGERY     scalp; "22 stitches around; 9 stitches down; went all the way to the skull"  . NASAL SEPTUM SURGERY    . RFA RIGHT KIDNEY    . TONSILLECTOMY  1933    There were no vitals filed for this visit.  Subjective Assessment - 11/23/18 1453    Subjective  No back pain today     How long can you sit comfortably?  no problem     How long can you stand comfortably?  Able to stand for 10-15 minutes prior fatigue and LBP (was five-ten  minutes)    How long can you walk comfortably?  Able to walk with QC for a 1-2 blocks in about 10-15 minutes (PT walks with a quad cane normally in the home only,     Currently in Pain?  No/denies             [facility] Adult PT Treatment/Exercise - 11/23/18 0001      Exercises   Exercises  Lumbar      Lumbar Exercises: Stretches   Active Hamstring Stretch  Right;Left;2 reps;30 seconds    Single Knee to Chest  Stretch  Right;Left;3 reps;30 seconds    Standing Side Bend  Right;Left;5 reps    Standing Extension  5 reps      Lumbar Exercises: Aerobic   Nustep  hills 3 level 2       Lumbar Exercises: Standing   Heel Raises Forward step ups 4"  10 reps  10 reps    Functional Squats  10 reps    Other Standing Lumbar Exercises  tandem stance with UE flexion B       Lumbar Exercises: Supine   Bridge  10 reps    Other Supine Lumbar Exercises  Decompression 1-5 10x each               PT Short Term Goals - 11/17/18 1405      PT SHORT TERM GOAL #1   Title  Pt to be able to get in and out of bed with ease.     Baseline  11/17/18:  Reports ability and able to demonstrate getting in/out of bed wiht ease    Status  Achieved      PT SHORT TERM GOAL #2   Title  PT LE strength to be increased 1/2 grade to allow pt to come sit to stand with UE assist with increased ease.     Baseline  11/17/18: see MMT      PT SHORT TERM GOAL #3   Title  PT back pain to decrease to no greater than a 4/10 to allow pt to walk with his quad cane for 5 minutes without increased pain.     Baseline  11/17/18:  Reports ability to walk for 5 minutes wiht no pain    Status  Achieved      PT SHORT TERM GOAL #4   Title  PT to be able to single leg stance on both LE for 5 seconds to decrease risk of falling.         PT Long Term Goals - 10/30/18 1530      PT LONG TERM GOAL #1   Title  PT LE strength to be increased by one grade to allow pt to come sit to stand without having to use his UE>     Time  6  Period  Weeks    Status  On-going      PT LONG TERM GOAL #2   Title  PT back pain to be no greater than a 2/10 to allow pt to walk with his cane painfree for ten minutes to be able to complete household tasks.     Time  6    Period  Weeks    Status  On-going      PT LONG TERM GOAL #3   Title  PT ROM of hip and back to improve to allow pt to be able to don shoes and socks without pain     Time  6    Period   Weeks    Status  On-going      PT LONG TERM GOAL #4   Title  PT to be able to single leg balance on both LE for 15 seconds to allow pt to feel confident walking on uneven ground.     Time  6    Period  Weeks    Status  On-going            Plan - 11/23/18 1515    Clinical Impression Statement  PT fatigued from being at the [facility]; added step ups to program.  Pt ambulating with longer stride at this time.  Pt will continue to benefit from skilled therapy to work on balance, gt, ROMand strength to decrease risk of falling and decrease back pain     Rehab Potential  Good    PT Frequency  3x / week    PT Duration  6 weeks    PT Treatment/Interventions  ADLs/Self Care Home Management;Patient/family education;Therapeutic exercise;Therapeutic activities;Balance training;Functional mobility training;Stair training;Gait training;DME Instruction;Manual techniques    PT Next Visit Plan   Continue gait and progression of transitional motions/balance.     PT Home Exercise Plan  sitting tall with scapular retraction, decompression 1-3; 1/20:  bend knee lift , clams; 11/03/2018 -  decompression exercises 1-5 and 1-4 w/ RTB.        Patient will benefit from skilled therapeutic intervention in order to improve the following deficits and impairments:  Abnormal gait, Decreased activity tolerance, Decreased balance, Decreased mobility, Decreased strength, Decreased range of motion, Difficulty walking, Postural dysfunction, Pain, Improper body mechanics  Visit Diagnosis: Difficulty in walking, not elsewhere classified  Unsteadiness on feet     Problem List Patient Active Problem List   Diagnosis Date Noted  . Goals of care, counseling/discussion   . Acute blood loss anemia 09/28/2016  . CAD (coronary artery disease)   . Pancytopenia ([place])   . Epistaxis, recurrent   . Hyperuricemia   . Acute myeloid leukemia in adult [facility])   . AKI (acute kidney injury) ([place])   . Troponin level elevated   .  Leukocytosis   . Lobar pneumonia, unspecified organism ([place])   . Acute on chronic kidney failure ([place]) 09/04/2016  . Acute on chronic systolic CHF (congestive heart failure) ([place]) 09/04/2016  . Thrombocytopenia ([place]) 09/04/2016  . Cellulitis 09/04/2016  . Chronic combined systolic and diastolic CHF (congestive heart failure) ([place]) 09/04/2016  . Paroxysmal atrial fibrillation ([facility])   . Cricopharyngeal dysphagia 11/06/2015  . Esophageal dysphagia 11/06/2015  . Carotid disease, bilateral ([place]) 12/05/2014  . TIA (transient ischemic attack) 12/04/2014  . MGUS (monoclonal gammopathy of unknown significance) 03/29/2011  . Renal cancer ([place]) 03/29/2011    Class: Diagnosis of  . Shortness of breath 12/24/2010  . EDEMA 11/17/2010  . HEADACHE 10/28/2009  .  Coronary artery disease due to lipid rich plaque 09/25/2009  . NOSEBLEED 09/22/2009  . Hypothyroidism, acquired 06/29/2009  . DYSLIPIDEMIA 06/29/2009  . Essential hypertension 06/29/2009  . CHEST PAIN 06/29/2009   Rayetta Humphrey, PT CLT [phone] 11/23/2018, 3:17 PM  [facility] [address] [place], [place], [address] Phone: [phone]   Fax:  [phone]  Name: Brian Mcmillan MRN: [id number] Date of Birth: [birth year]

## 2018-11-24 ENCOUNTER — Ambulatory Visit (HOSPITAL_COMMUNITY): Payer: Medicare Other

## 2018-11-24 ENCOUNTER — Encounter (HOSPITAL_COMMUNITY): Payer: Self-pay

## 2018-11-24 DIAGNOSIS — R262 Difficulty in walking, not elsewhere classified: Secondary | ICD-10-CM | POA: Diagnosis not present

## 2018-11-24 DIAGNOSIS — R2681 Unsteadiness on feet: Secondary | ICD-10-CM

## 2018-11-24 DIAGNOSIS — M545 Low back pain, unspecified: Secondary | ICD-10-CM

## 2018-11-24 NOTE — Therapy (Signed)
[facility] [address] [place], [place], [address] Phone: [phone]   Fax:  [phone]  Physical Therapy Treatment  Patient Details  Name: Brian Mcmillan MRN: [id number] Date of Birth: 1925/04/03 Referring Provider (PT): Everardo All   Encounter Date: 11/24/2018  PT End of Session - 11/24/18 1325    Visit Number  13    Number of Visits  18    Date for PT Re-Evaluation  12/04/18    Authorization Type  Medicare A & B    Authorization Time Period  1/13-->12/04/18    Authorization - Visit Number  13    Authorization - Number of Visits  18    PT Start Time  1301    PT Stop Time  7654   EOS on Nustep, not included wiht charges   PT Time Calculation (min)  45 min    Equipment Utilized During Treatment  Gait belt    Activity Tolerance  Patient tolerated treatment well    Behavior During Therapy  [facility] for tasks assessed/performed       Past Medical History:  Diagnosis Date  . Abdominal mass 2008   chronic inflammatory mass of the mesentary Dr [name]  . 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 Anemia   . Anginal pain ([place])   . CAD (coronary artery disease)    cath 09/23/09 70% Dx1 (diffuse disease), 40% Cx, Large RCA 80-90% with heavy calcification / cath 06/2010 no change tiht RCA still best to treat medially with nosebleeds  . Chest pain    myoview 2006 no ischemia/ myoview 2009 no ischemia  . CHF (congestive heart failure) ([place])   . CKD (chronic kidney disease)   . COPD (chronic obstructive pulmonary disease) ([place])   . Dyslipidemia   . GERD (gastroesophageal reflux disease)   . Gout   . History of bleeding ulcers    "had 13 back in the early 1980s" (09/28/2016)  . History of blood transfusion 09/28/2016  . Hypertension    no RAS  . Hypothyroidism   . Kidney mass 2008   radiofrequency ablation at [facility]  . Left ventricular dysfunction    myoview 2006 normal / myoview 2009 normal / EF 60% cath 09/2009, EF  60% cath 06/2010  . Memory impairment    memory decreasing 06/2010  . Myocardial infarction ([place]) <2016X 1; 2016   "@ [facility]; @ [facility]"  . Nosebleed "frequently since ~ 1966"   significant, can not take ASA  . Pneumonia 08/2016-09/2016  . Rash Sept 2011   rash on buttocks, hospitalized hydralazine stopped but then restarted w/o return of rash  . Renal cancer ([place])   . Skin cancer of scalp   . Systolic click    no mitral valve prolapse  . Type II diabetes mellitus ([place])     Past Surgical History:  Procedure Laterality Date  . BACK SURGERY    . BONE MARROW ASPIRATION     X2  . BONE MARROW BIOPSY     X2  . CARDIAC CATHETERIZATION    . CATARACT EXTRACTION W/ INTRAOCULAR LENS  IMPLANT, BILATERAL Bilateral   . COLONOSCOPY N/A 03/07/2013   Procedure: COLONOSCOPY;  Surgeon: [name], MD;  Location[facility];  Service: Endoscopy;  Laterality: N/A;  830-moved to 76 Ann to notify pt  . COLONOSCOPY  01/2013  . LUMBAR DISC SURGERY     "L4-5"  . LYMPH NODE DISSECTION Right     abdomen  .  LYMPH NODE DISSECTION Left    robotic laser @ [facility]  on left side of stomach  . MOHS SURGERY     scalp; "22 stitches around; 9 stitches down; went all the way to the skull"  . NASAL SEPTUM SURGERY    . RFA RIGHT KIDNEY    . TONSILLECTOMY  1933    There were no vitals filed for this visit.  Subjective Assessment - 11/24/18 1257    Subjective  No back pain today     Pertinent History  TIA, CKD, CAD, HTN, Hx of renal cancer.     Patient Stated Goals  To be able to walk safer, and move in and out of bed easier.     Currently in Pain?  No/denies                       [facility] Adult PT Treatment/Exercise - 11/24/18 0001      Lumbar Exercises: Stretches   Active Hamstring Stretch  Right;Left;30 seconds;3 reps    Active Hamstring Stretch Limitations  supine, hands behind knees    Standing Side Bend  Right;Left;5 reps    Standing Extension  5 reps      Lumbar Exercises: Aerobic    Nustep  hills 3 level 2       Lumbar Exercises: Standing   Heel Raises  10 reps    Functional Squats  10 reps    Other Standing Lumbar Exercises  2RT reciprocal pattern 1st with QC 2nd wihtout just 1 HR ascend and 2HR descending stairs    Other Standing Lumbar Exercises  tandem stance with UE flexion B       Lumbar Exercises: Seated   Sit to Stand  10 reps   no HHA   Sit to Stand Limitations  eccentric control no HHA      Lumbar Exercises: Supine   Bridge  10 reps          Balance Exercises - 11/24/18 1900      Balance Exercises: Standing   Tandem Stance  Eyes open;Upper extremity support 1   UE flexion   Retro Gait  2 reps    Sidestepping  2 reps;Theraband    Marching Limitations  10    Sit to Stand Time  10          PT Short Term Goals - 11/17/18 1405      PT SHORT TERM GOAL #1   Title  Pt to be able to get in and out of bed with ease.     Baseline  11/17/18:  Reports ability and able to demonstrate getting in/out of bed wiht ease    Status  Achieved      PT SHORT TERM GOAL #2   Title  PT LE strength to be increased 1/2 grade to allow pt to come sit to stand with UE assist with increased ease.     Baseline  11/17/18: see MMT      PT SHORT TERM GOAL #3   Title  PT back pain to decrease to no greater than a 4/10 to allow pt to walk with his quad cane for 5 minutes without increased pain.     Baseline  11/17/18:  Reports ability to walk for 5 minutes wiht no pain    Status  Achieved      PT SHORT TERM GOAL #4   Title  PT to be able to single leg stance on both LE for  5 seconds to decrease risk of falling.         PT Long Term Goals - 10/30/18 1530      PT LONG TERM GOAL #1   Title  PT LE strength to be increased by one grade to allow pt to come sit to stand without having to use his UE>     Time  6    Period  Weeks    Status  On-going      PT LONG TERM GOAL #2   Title  PT back pain to be no greater than a 2/10 to allow pt to walk with his cane  painfree for ten minutes to be able to complete household tasks.     Time  6    Period  Weeks    Status  On-going      PT LONG TERM GOAL #3   Title  PT ROM of hip and back to improve to allow pt to be able to don shoes and socks without pain     Time  6    Period  Weeks    Status  On-going      PT LONG TERM GOAL #4   Title  PT to be able to single leg balance on both LE for 15 seconds to allow pt to feel confident walking on uneven ground.     Time  6    Period  Weeks    Status  On-going            Plan - 11/24/18 1857    Clinical Impression Statement  Pt presents with good sequence and increased stride length with gait this session.  Progressed to reciprocal stair training with cueing for proper sequence wiht QC, moderate cueing for proper sequence for functional strengthening.  Cotninued wiht ROM, functional strengthening and balance activities per PT POC.      Rehab Potential  Good    PT Frequency  3x / week    PT Duration  6 weeks    PT Treatment/Interventions  ADLs/Self Care Home Management;Patient/family education;Therapeutic exercise;Therapeutic activities;Balance training;Functional mobility training;Stair training;Gait training;DME Instruction;Manual techniques    PT Next Visit Plan   Continue gait and progression of transitional motions/balance.     PT Home Exercise Plan  sitting tall with scapular retraction, decompression 1-3; 1/20:  bend knee lift , clams; 11/03/2018 -  decompression exercises 1-5 and 1-4 w/ RTB.        Patient will benefit from skilled therapeutic intervention in order to improve the following deficits and impairments:  Abnormal gait, Decreased activity tolerance, Decreased balance, Decreased mobility, Decreased strength, Decreased range of motion, Difficulty walking, Postural dysfunction, Pain, Improper body mechanics  Visit Diagnosis: Difficulty in walking, not elsewhere classified  Unsteadiness on feet  Acute bilateral low back pain without  sciatica     Problem List Patient Active Problem List   Diagnosis Date Noted  . Goals of care, counseling/discussion   . Acute blood loss anemia 09/28/2016  . CAD (coronary artery disease)   . Pancytopenia ([place])   . Epistaxis, recurrent   . Hyperuricemia   . Acute myeloid leukemia in adult [facility])   . AKI (acute kidney injury) ([place])   . Troponin level elevated   . Leukocytosis   . Lobar pneumonia, unspecified organism ([place])   . Acute on chronic kidney failure ([place]) 09/04/2016  . Acute on chronic systolic CHF (congestive heart failure) ([place]) 09/04/2016  . Thrombocytopenia ([place]) 09/04/2016  . Cellulitis 09/04/2016  .  Chronic combined systolic and diastolic CHF (congestive heart failure) ([place]) 09/04/2016  . Paroxysmal atrial fibrillation ([facility])   . Cricopharyngeal dysphagia 11/06/2015  . Esophageal dysphagia 11/06/2015  . Carotid disease, bilateral ([place]) 12/05/2014  . TIA (transient ischemic attack) 12/04/2014  . MGUS (monoclonal gammopathy of unknown significance) 03/29/2011  . Renal cancer ([place]) 03/29/2011    Class: Diagnosis of  . Shortness of breath 12/24/2010  . EDEMA 11/17/2010  . HEADACHE 10/28/2009  . Coronary artery disease due to lipid rich plaque 09/25/2009  . NOSEBLEED 09/22/2009  . Hypothyroidism, acquired 06/29/2009  . DYSLIPIDEMIA 06/29/2009  . Essential hypertension 06/29/2009  . CHEST PAIN 06/29/2009   Ihor Austin, [place]; [place]  Aldona Lento 11/24/2018, 7:02 PM  [place], [place], [address] Phone: [phone]   Fax:  [phone]  Name: Brian Mcmillan MRN: [id number] Date of Birth: [birth year]

## 2018-11-27 ENCOUNTER — Ambulatory Visit (HOSPITAL_COMMUNITY): Payer: Medicare Other | Admitting: Physical Therapy

## 2018-11-27 DIAGNOSIS — R2681 Unsteadiness on feet: Secondary | ICD-10-CM

## 2018-11-27 DIAGNOSIS — R262 Difficulty in walking, not elsewhere classified: Secondary | ICD-10-CM | POA: Diagnosis not present

## 2018-11-27 DIAGNOSIS — M545 Low back pain, unspecified: Secondary | ICD-10-CM

## 2018-11-27 NOTE — Therapy (Signed)
[facility] [address] [place], [place], [address] Phone: [phone]   Fax:  [phone]  Physical Therapy Treatment  Patient Details  Name: Brian Mcmillan MRN: [id number] Date of Birth: Nov 30, 1924 Referring Provider (PT): Everardo All   Encounter Date: 11/27/2018  PT End of Session - 11/27/18 1614    Visit Number  14    Number of Visits  18    Date for PT Re-Evaluation  12/04/18    Authorization Type  Medicare A & B    Authorization Time Period  1/13-->12/04/18    Authorization - Visit Number  14    Authorization - Number of Visits  18    PT Start Time  9326    PT Stop Time  1530    PT Time Calculation (min)  53 min    Equipment Utilized During Treatment  Gait belt    Activity Tolerance  Patient tolerated treatment well    Behavior During Therapy  [facility] for tasks assessed/performed       Past Medical History:  Diagnosis Date  . Abdominal mass 2008   chronic inflammatory mass of the mesentary Dr [name]  . 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 Anemia   . Anginal pain ([place])   . CAD (coronary artery disease)    cath 09/23/09 70% Dx1 (diffuse disease), 40% Cx, Large RCA 80-90% with heavy calcification / cath 06/2010 no change tiht RCA still best to treat medially with nosebleeds  . Chest pain    myoview 2006 no ischemia/ myoview 2009 no ischemia  . CHF (congestive heart failure) ([place])   . CKD (chronic kidney disease)   . COPD (chronic obstructive pulmonary disease) ([place])   . Dyslipidemia   . GERD (gastroesophageal reflux disease)   . Gout   . History of bleeding ulcers    "had 13 back in the early 1980s" (09/28/2016)  . History of blood transfusion 09/28/2016  . Hypertension    no RAS  . Hypothyroidism   . Kidney mass 2008   radiofrequency ablation at [facility]  . Left ventricular dysfunction    myoview 2006 normal / myoview 2009 normal / EF 60% cath 09/2009, EF 60% cath 06/2010  . Memory impairment     memory decreasing 06/2010  . Myocardial infarction ([place]) <2016X 1; 2016   "@ [facility]; @ [facility]"  . Nosebleed "frequently since ~ 1966"   significant, can not take ASA  . Pneumonia 08/2016-09/2016  . Rash Sept 2011   rash on buttocks, hospitalized hydralazine stopped but then restarted w/o return of rash  . Renal cancer ([place])   . Skin cancer of scalp   . Systolic click    no mitral valve prolapse  . Type II diabetes mellitus ([place])     Past Surgical History:  Procedure Laterality Date  . BACK SURGERY    . BONE MARROW ASPIRATION     X2  . BONE MARROW BIOPSY     X2  . CARDIAC CATHETERIZATION    . CATARACT EXTRACTION W/ INTRAOCULAR LENS  IMPLANT, BILATERAL Bilateral   . COLONOSCOPY N/A 03/07/2013   Procedure: COLONOSCOPY;  Surgeon: [name], MD;  [facility];  Service: Endoscopy;  Laterality: N/A;  830-moved to 54 Ann to notify pt  . COLONOSCOPY  01/2013  . LUMBAR DISC SURGERY     "L4-5"  . LYMPH NODE DISSECTION Right     abdomen  . LYMPH NODE DISSECTION Left  robotic laser @ [facility]  on left side of stomach  . MOHS SURGERY     scalp; "22 stitches around; 9 stitches down; went all the way to the skull"  . NASAL SEPTUM SURGERY    . RFA RIGHT KIDNEY    . TONSILLECTOMY  1933    There were no vitals filed for this visit.                    [facility] Adult PT Treatment/Exercise - 11/27/18 0001      Lumbar Exercises: Stretches   Standing Side Bend  Right;Left;10 seconds    Standing Extension  10 reps      Lumbar Exercises: Aerobic   Nustep  6' hills 3 level 2, UE/LE       Lumbar Exercises: Standing   Heel Raises  15 reps    Functional Squats  15 reps    Other Standing Lumbar Exercises  6in height: 4RT reciprocal pattern 1 UE     Other Standing Lumbar Exercises  tandem stance with UE flexion bil 1# bar 10 reps each      Lumbar Exercises: Seated   Sit to Stand  10 reps    Sit to Stand Limitations  eccentric control no HHA                PT Short Term Goals - 11/17/18 1405      PT SHORT TERM GOAL #1   Title  Pt to be able to get in and out of bed with ease.     Baseline  11/17/18:  Reports ability and able to demonstrate getting in/out of bed wiht ease    Status  Achieved      PT SHORT TERM GOAL #2   Title  PT LE strength to be increased 1/2 grade to allow pt to come sit to stand with UE assist with increased ease.     Baseline  11/17/18: see MMT      PT SHORT TERM GOAL #3   Title  PT back pain to decrease to no greater than a 4/10 to allow pt to walk with his quad cane for 5 minutes without increased pain.     Baseline  11/17/18:  Reports ability to walk for 5 minutes wiht no pain    Status  Achieved      PT SHORT TERM GOAL #4   Title  PT to be able to single leg stance on both LE for 5 seconds to decrease risk of falling.         PT Long Term Goals - 10/30/18 1530      PT LONG TERM GOAL #1   Title  PT LE strength to be increased by one grade to allow pt to come sit to stand without having to use his UE>     Time  6    Period  Weeks    Status  On-going      PT LONG TERM GOAL #2   Title  PT back pain to be no greater than a 2/10 to allow pt to walk with his cane painfree for ten minutes to be able to complete household tasks.     Time  6    Period  Weeks    Status  On-going      PT LONG TERM GOAL #3   Title  PT ROM of hip and back to improve to allow pt to be able to don shoes and socks  without pain     Time  6    Period  Weeks    Status  On-going      PT LONG TERM GOAL #4   Title  PT to be able to single leg balance on both LE for 15 seconds to allow pt to feel confident walking on uneven ground.     Time  6    Period  Weeks    Status  On-going            Plan - 11/27/18 1615    Clinical Impression Statement  Able to complete session today with only one seated rest break.  Pt appears to have increased tolerance to activiy and improving stability.  Increased challenge of UE  flexion with tandem and vectors added with cues for posturing and form.  LE's become weak and becomes difficult to keep from buckling.  completed nustep at [facility] and increased to 8 minutes (no charge).  Pt with increased ease with steps completing 6" reciprocally with 1 HR only     Rehab Potential  Good    PT Frequency  3x / week    PT Duration  6 weeks    PT Treatment/Interventions  ADLs/Self Care Home Management;Patient/family education;Therapeutic exercise;Therapeutic activities;Balance training;Functional mobility training;Stair training;Gait training;DME Instruction;Manual techniques    PT Next Visit Plan   Continue gait and progression of transitional motions/balance.     PT Home Exercise Plan  sitting tall with scapular retraction, decompression 1-3; 1/20:  bend knee lift , clams; 11/03/2018 -  decompression exercises 1-5 and 1-4 w/ RTB.        Patient will benefit from skilled therapeutic intervention in order to improve the following deficits and impairments:  Abnormal gait, Decreased activity tolerance, Decreased balance, Decreased mobility, Decreased strength, Decreased range of motion, Difficulty walking, Postural dysfunction, Pain, Improper body mechanics  Visit Diagnosis: Difficulty in walking, not elsewhere classified  Unsteadiness on feet  Acute bilateral low back pain without sciatica     Problem List Patient Active Problem List   Diagnosis Date Noted  . Goals of care, counseling/discussion   . Acute blood loss anemia 09/28/2016  . CAD (coronary artery disease)   . Pancytopenia ([place])   . Epistaxis, recurrent   . Hyperuricemia   . Acute myeloid leukemia in adult [facility])   . AKI (acute kidney injury) ([place])   . Troponin level elevated   . Leukocytosis   . Lobar pneumonia, unspecified organism ([place])   . Acute on chronic kidney failure ([place]) 09/04/2016  . Acute on chronic systolic CHF (congestive heart failure) ([place]) 09/04/2016  . Thrombocytopenia ([place]) 09/04/2016  .  Cellulitis 09/04/2016  . Chronic combined systolic and diastolic CHF (congestive heart failure) ([place]) 09/04/2016  . Paroxysmal atrial fibrillation ([facility])   . Cricopharyngeal dysphagia 11/06/2015  . Esophageal dysphagia 11/06/2015  . Carotid disease, bilateral ([place]) 12/05/2014  . TIA (transient ischemic attack) 12/04/2014  . MGUS (monoclonal gammopathy of unknown significance) 03/29/2011  . Renal cancer ([place]) 03/29/2011    Class: Diagnosis of  . Shortness of breath 12/24/2010  . EDEMA 11/17/2010  . HEADACHE 10/28/2009  . Coronary artery disease due to lipid rich plaque 09/25/2009  . NOSEBLEED 09/22/2009  . Hypothyroidism, acquired 06/29/2009  . DYSLIPIDEMIA 06/29/2009  . Essential hypertension 06/29/2009  . CHEST PAIN 06/29/2009   Teena Irani, PTA/CLT [phone]  Teena Irani 11/27/2018, 4:26 PM  [facility] [address] [place], [place], [address] Phone: [phone]  Fax:  [phone]  Name: Brian Mcmillan MRN: [id number] Date of Birth: [birth year]

## 2018-11-29 ENCOUNTER — Encounter (HOSPITAL_COMMUNITY): Payer: Self-pay

## 2018-11-29 ENCOUNTER — Ambulatory Visit (HOSPITAL_COMMUNITY): Payer: Medicare Other

## 2018-11-29 VITALS — BP 149/66 | HR 61

## 2018-11-29 DIAGNOSIS — M545 Low back pain, unspecified: Secondary | ICD-10-CM

## 2018-11-29 DIAGNOSIS — R262 Difficulty in walking, not elsewhere classified: Secondary | ICD-10-CM | POA: Diagnosis not present

## 2018-11-29 DIAGNOSIS — R2681 Unsteadiness on feet: Secondary | ICD-10-CM

## 2018-11-29 NOTE — Therapy (Signed)
[facility] [address] [place], [place], [address] Phone: [phone]   Fax:  [phone]  Physical Therapy Treatment  Patient Details  Name: Brian Mcmillan MRN: [id number] Date of Birth: August 23, 1925 Referring Provider (PT): Everardo All   Encounter Date: 11/29/2018  PT End of Session - 11/29/18 1526    Visit Number  15    Number of Visits  18    Date for PT Re-Evaluation  12/04/18    Authorization Type  Medicare A & B    Authorization Time Period  1/13-->12/04/18    Authorization - Visit Number  15    Authorization - Number of Visits  18    PT Start Time  1435    PT Stop Time  1520    PT Time Calculation (min)  45 min    Equipment Utilized During Treatment  Gait belt    Activity Tolerance  Patient tolerated treatment well    Behavior During Therapy  [facility] for tasks assessed/performed       Past Medical History:  Diagnosis Date  . Abdominal mass 2008   chronic inflammatory mass of the mesentary Dr [name]  . 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 Anemia   . Anginal pain ([place])   . CAD (coronary artery disease)    cath 09/23/09 70% Dx1 (diffuse disease), 40% Cx, Large RCA 80-90% with heavy calcification / cath 06/2010 no change tiht RCA still best to treat medially with nosebleeds  . Chest pain    myoview 2006 no ischemia/ myoview 2009 no ischemia  . CHF (congestive heart failure) ([place])   . CKD (chronic kidney disease)   . COPD (chronic obstructive pulmonary disease) ([place])   . Dyslipidemia   . GERD (gastroesophageal reflux disease)   . Gout   . History of bleeding ulcers    "had 13 back in the early 1980s" (09/28/2016)  . History of blood transfusion 09/28/2016  . Hypertension    no RAS  . Hypothyroidism   . Kidney mass 2008   [facility] at [facility]  . Left ventricular dysfunction    myoview 2006 normal / myoview 2009 normal / EF 60% cath 09/2009, EF 60% cath 06/2010  . Memory impairment    memory decreasing 06/2010  . Myocardial infarction ([place]) <2016X 1; 2016   "@ [facility]; @ [facility]"  . Nosebleed "frequently since ~ 1966"   significant, can not take ASA  . Pneumonia 08/2016-09/2016  . Rash Sept 2011   rash on buttocks, hospitalized hydralazine stopped but then restarted w/o return of rash  . Renal cancer ([place])   . Skin cancer of scalp   . Systolic click    no mitral valve prolapse  . Type II diabetes mellitus ([place])     Past Surgical History:  Procedure Laterality Date  . BACK SURGERY    . BONE MARROW ASPIRATION     X2  . BONE MARROW BIOPSY     X2  . CARDIAC CATHETERIZATION    . CATARACT EXTRACTION W/ INTRAOCULAR LENS  IMPLANT, BILATERAL Bilateral   . COLONOSCOPY N/A 03/07/2013   Procedure: COLONOSCOPY;  Surgeon: [name], MD;  [facility];  Service: Endoscopy;  Laterality: N/A;  830-moved to 48 Ann to notify pt  . COLONOSCOPY  01/2013  . LUMBAR DISC SURGERY     "L4-5"  . LYMPH NODE DISSECTION Right     abdomen  . LYMPH NODE DISSECTION Left    robotic  laser @ [facility]  on left side of stomach  . MOHS SURGERY     scalp; "22 stitches around; 9 stitches down; went all the way to the skull"  . NASAL SEPTUM SURGERY    . RFA RIGHT KIDNEY    . TONSILLECTOMY  1933    Vitals:   11/29/18 1531  BP: (!) 149/66  Pulse: 61    Subjective Assessment - 11/29/18 1530    Subjective  No reports of back pain today, reports BP higher today and has taken medication as prescribed.    Pertinent History  TIA, CKD, CAD, HTN, Hx of renal cancer.     Patient Stated Goals  To be able to walk safer, and move in and out of bed easier.     Currently in Pain?  No/denies                       [facility] Adult PT Treatment/Exercise - 11/29/18 0001      Lumbar Exercises: Stretches   Other Lumbar Stretch Exercise  Y at wall wiht heel raise 15x 3" holds      Lumbar Exercises: Standing   Heel Raises  15 reps    Heel Raises Limitations  paired with squats and  orange volley ball    Functional Squats  15 reps    Functional Squats Limitations  paired with squats and orange volley ball    Other Standing Lumbar Exercises  7in height: 3RT reciprocal pattern with 1 HR          Balance Exercises - 11/29/18 1528      Balance Exercises: Standing   Tandem Stance  Eyes open;Upper extremity support 1   Tandem on foam wiht UE flexion 1# 10x   SLS  Eyes open   Throw and catch volley ball then stop ball with foot and kic         PT Short Term Goals - 11/17/18 1405      PT SHORT TERM GOAL #1   Title  Pt to be able to get in and out of bed with ease.     Baseline  11/17/18:  Reports ability and able to demonstrate getting in/out of bed wiht ease    Status  Achieved      PT SHORT TERM GOAL #2   Title  PT LE strength to be increased 1/2 grade to allow pt to come sit to stand with UE assist with increased ease.     Baseline  11/17/18: see MMT      PT SHORT TERM GOAL #3   Title  PT back pain to decrease to no greater than a 4/10 to allow pt to walk with his quad cane for 5 minutes without increased pain.     Baseline  11/17/18:  Reports ability to walk for 5 minutes wiht no pain    Status  Achieved      PT SHORT TERM GOAL #4   Title  PT to be able to single leg stance on both LE for 5 seconds to decrease risk of falling.         PT Long Term Goals - 10/30/18 1530      PT LONG TERM GOAL #1   Title  PT LE strength to be increased by one grade to allow pt to come sit to stand without having to use his UE>     Time  6    Period  Weeks  Status  On-going      PT LONG TERM GOAL #2   Title  PT back pain to be no greater than a 2/10 to allow pt to walk with his cane painfree for ten minutes to be able to complete household tasks.     Time  6    Period  Weeks    Status  On-going      PT LONG TERM GOAL #3   Title  PT ROM of hip and back to improve to allow pt to be able to don shoes and socks without pain     Time  6    Period  Weeks     Status  On-going      PT LONG TERM GOAL #4   Title  PT to be able to single leg balance on both LE for 15 seconds to allow pt to feel confident walking on uneven ground.     Time  6    Period  Weeks    Status  On-going            Plan - 11/29/18 1533    Clinical Impression Statement  Added throw/catch as well stop/kick volley for dynamic balance training; noted increased difficutly reaching outside BOS requiring min A for safety.  Pt able to pick up ball from floor safely with just min guard.  Pt educated importance of posture, cueing required through session to improve awareness and correct through session.  No reoprts of pain through session, was limited by fatigue.      Rehab Potential  Good    PT Frequency  3x / week    PT Duration  6 weeks    PT Treatment/Interventions  ADLs/Self Care Home Management;Patient/family education;Therapeutic exercise;Therapeutic activities;Balance training;Functional mobility training;Stair training;Gait training;DME Instruction;Manual techniques    PT Next Visit Plan   Continue gait and progression of transitional motions/balance.     PT Home Exercise Plan  sitting tall with scapular retraction, decompression 1-3; 1/20:  bend knee lift , clams; 11/03/2018 -  decompression exercises 1-5 and 1-4 w/ RTB.        Patient will benefit from skilled therapeutic intervention in order to improve the following deficits and impairments:  Abnormal gait, Decreased activity tolerance, Decreased balance, Decreased mobility, Decreased strength, Decreased range of motion, Difficulty walking, Postural dysfunction, Pain, Improper body mechanics  Visit Diagnosis: Difficulty in walking, not elsewhere classified  Unsteadiness on feet  Acute bilateral low back pain without sciatica     Problem List Patient Active Problem List   Diagnosis Date Noted  . Goals of care, counseling/discussion   . Acute blood loss anemia 09/28/2016  . CAD (coronary artery disease)   .  Pancytopenia ([place])   . Epistaxis, recurrent   . Hyperuricemia   . Acute myeloid leukemia in adult [facility])   . AKI (acute kidney injury) ([place])   . Troponin level elevated   . Leukocytosis   . Lobar pneumonia, unspecified organism ([place])   . Acute on chronic kidney failure ([place]) 09/04/2016  . Acute on chronic systolic CHF (congestive heart failure) ([place]) 09/04/2016  . Thrombocytopenia ([place]) 09/04/2016  . Cellulitis 09/04/2016  . Chronic combined systolic and diastolic CHF (congestive heart failure) ([place]) 09/04/2016  . Paroxysmal atrial fibrillation ([facility])   . Cricopharyngeal dysphagia 11/06/2015  . Esophageal dysphagia 11/06/2015  . Carotid disease, bilateral ([place]) 12/05/2014  . TIA (transient ischemic attack) 12/04/2014  . MGUS (monoclonal gammopathy of unknown significance) 03/29/2011  . Renal cancer ([place]) 03/29/2011  Class: Diagnosis of  . Shortness of breath 12/24/2010  . EDEMA 11/17/2010  . HEADACHE 10/28/2009  . Coronary artery disease due to lipid rich plaque 09/25/2009  . NOSEBLEED 09/22/2009  . Hypothyroidism, acquired 06/29/2009  . DYSLIPIDEMIA 06/29/2009  . Essential hypertension 06/29/2009  . CHEST PAIN 06/29/2009   Ihor Austin, [place]; [place]  Aldona Lento 11/29/2018, 3:37 PM  [facility] [place], [place], [address] Phone: [phone]   Fax:  [phone]  Name: Brian Mcmillan MRN: [id number] Date of Birth: 1925/10/01

## 2018-12-01 ENCOUNTER — Encounter (HOSPITAL_COMMUNITY): Payer: Self-pay | Admitting: Physical Therapy

## 2018-12-01 ENCOUNTER — Ambulatory Visit (HOSPITAL_COMMUNITY): Payer: Medicare Other | Admitting: Physical Therapy

## 2018-12-01 DIAGNOSIS — R262 Difficulty in walking, not elsewhere classified: Secondary | ICD-10-CM

## 2018-12-01 DIAGNOSIS — R2681 Unsteadiness on feet: Secondary | ICD-10-CM | POA: Diagnosis not present

## 2018-12-01 DIAGNOSIS — M545 Low back pain, unspecified: Secondary | ICD-10-CM

## 2018-12-01 NOTE — Therapy (Signed)
[facility] [address] [place], [place], [address] Phone: [phone]   Fax:  [phone]  Physical Therapy Treatment/Discharge  Patient Details  Name: Brian Mcmillan MRN: [id number] Date of Birth: [birth year] Referring Provider (PT): Everardo All  PHYSICAL THERAPY DISCHARGE SUMMARY  Visits from Start of Care: 16  Current functional level related to goals / functional outcomes: See below    Remaining deficits: balance   Education / Equipment: HEP Plan: Patient agrees to discharge.  Patient goals were met. Patient is being discharged due to meeting the stated rehab goals.  ?????       Encounter Date: 12/01/2018  PT End of Session - 12/01/18 1442    Visit Number  16    Number of Visits  18    Date for PT Re-Evaluation  12/04/18    Authorization Type  Medicare A & B    Authorization Time Period  1/13-->12/04/18    Authorization - Visit Number  16    Authorization - Number of Visits  18    PT Start Time  1884    PT Stop Time  1515    PT Time Calculation (min)  38 min    Equipment Utilized During Treatment  Gait belt    Activity Tolerance  Patient tolerated treatment well    Behavior During Therapy  WFL for tasks assessed/performed       Past Medical History:  Diagnosis Date  . Abdominal mass 2008   chronic inflammatory mass of the mesentary Dr [name]  . 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 Anemia   . Anginal pain ([place])   . CAD (coronary artery disease)    cath 09/23/09 70% Dx1 (diffuse disease), 40% Cx, Large RCA 80-90% with heavy calcification / cath 06/2010 no change tiht RCA still best to treat medially with nosebleeds  . Chest pain    myoview 2006 no ischemia/ myoview 2009 no ischemia  . CHF (congestive heart failure) ([place])   . CKD (chronic kidney disease)   . COPD (chronic obstructive pulmonary disease) ([place])   . Dyslipidemia   . GERD (gastroesophageal reflux disease)   . Gout   . History  of bleeding ulcers    "had 13 back in the early 1980s" (09/28/2016)  . History of blood transfusion 09/28/2016  . Hypertension    no RAS  . Hypothyroidism   . Kidney mass 2008   radiofrequency ablation at [facility]  . Left ventricular dysfunction    myoview 2006 normal / myoview 2009 normal / EF 60% cath 09/2009, EF 60% cath 06/2010  . Memory impairment    memory decreasing 06/2010  . Myocardial infarction ([place]) <2016X 1; 2016   "@ [facility]; @ [facility]"  . Nosebleed "frequently since ~ 1966"   significant, can not take ASA  . Pneumonia 08/2016-09/2016  . Rash Sept 2011   rash on buttocks, hospitalized hydralazine stopped but then restarted w/o return of rash  . Renal cancer ([place])   . Skin cancer of scalp   . Systolic click    no mitral valve prolapse  . Type II diabetes mellitus ([place])     Past Surgical History:  Procedure Laterality Date  . BACK SURGERY    . BONE MARROW ASPIRATION     X2  . BONE MARROW BIOPSY     X2  . CARDIAC CATHETERIZATION    . CATARACT EXTRACTION W/ INTRAOCULAR LENS  IMPLANT, BILATERAL Bilateral   . COLONOSCOPY N/A 03/07/2013  Procedure: COLONOSCOPY;  Surgeon: [name], MD;  Location: [facility];  Service: Endoscopy;  Laterality: N/A;  830-moved to [address] to notify pt  . COLONOSCOPY  01/2013  . LUMBAR DISC SURGERY     "L4-5"  . LYMPH NODE DISSECTION Right     abdomen  . LYMPH NODE DISSECTION Left    robotic laser @ [facility]  on left side of stomach  . MOHS SURGERY     scalp; "22 stitches around; 9 stitches down; went all the way to the skull"  . NASAL SEPTUM SURGERY    . RFA RIGHT KIDNEY    . TONSILLECTOMY  1933    There were no vitals filed for this visit.  Subjective Assessment - 12/01/18 1441    Subjective  PT states that he is not having any back pain today     Pertinent History  TIA, CKD, CAD, HTN, Hx of renal cancer.     Patient Stated Goals  To be able to walk safer, and move in and out of bed easier.     Currently in Pain?  No/denies          [facility] PT Assessment - 12/01/18 0001      Assessment   Medical Diagnosis  Back pain with unsteadiness on feet    Referring Provider (PT)  Everardo All    Onset Date/Surgical Date  07/11/18    Next MD Visit  11/02/2018    Prior Therapy  none      Precautions   Precautions  Fall      Balance Screen   Has the patient fallen in the past 6 months  No    Has the patient had a decrease in activity level because of a fear of falling?   No    Is the patient reluctant to leave their home because of a fear of falling?   Yes      Observation/Other Assessments   Focus on Therapeutic Outcomes (FOTO)   67   was 38     Single Leg Stance   Comments  Lt 5" ; RT 3" was 0 B       Sit to Stand   Comments  5 x in 33 secnds was not able to come sit to stand without using UE support       Strength   Right Hip Flexion  4/5   was 3/5 at eval; 3+/5 on 11/17/18   Right Hip Extension  4/5   was 3-/5   Right Hip ABduction  4+/5   was 4-   Left Hip Flexion  4/5   was 3/5 at eval ; 3+ on 2/21    Left Hip Extension  4/5   was 3-/5   Left Hip ABduction  5/5   was 3+   Right Knee Flexion  4+/5   was 4-   Right Knee Extension  5/5   was 4/5   Left Knee Flexion  4+/5   was 3+    Left Knee Extension  5/5   was 3+/5   Right Ankle Dorsiflexion  4-/5   was 3+/5 at inital eval ; 4- on 2/7   Left Ankle Dorsiflexion  4-/5   was 3+ at initial eval 4- on 2/7     Functional Gait  Assessment   Gait assessed   Yes   394 feet in 3 minutes :         Heel raises, sit to stand x  10 each; SLS x 5 reps, bridges x 10    PT Short Term Goals - 12/01/18 1507      PT SHORT TERM GOAL #1   Title  Pt to be able to get in and out of bed with ease.     Baseline  11/17/18:  Reports ability and able to demonstrate getting in/out of bed wiht ease    Status  Achieved      PT SHORT TERM GOAL #2   Title  PT LE strength to be increased 1/2 grade to allow pt to come sit to stand with UE assist with  increased ease.     Baseline  11/17/18: see MMT    Status  Achieved      PT SHORT TERM GOAL #3   Title  PT back pain to decrease to no greater than a 4/10 to allow pt to walk with his quad cane for 5 minutes without increased pain.     Baseline  11/17/18:  Reports ability to walk for 5 minutes wiht no pain    Status  Achieved      PT SHORT TERM GOAL #4   Title  PT to be able to single leg stance on both LE for 5 seconds to decrease risk of falling.     Baseline  able on the left but not on the right     Status  Partially Met        PT Long Term Goals - 12/01/18 1507      PT LONG TERM GOAL #1   Title  PT LE strength to be increased by one grade to allow pt to come sit to stand without having to use his UE>     Time  6    Period  Weeks    Status  Achieved      PT LONG TERM GOAL #2   Title  PT back pain to be no greater than a 2/10 to allow pt to walk with his cane painfree for ten minutes to be able to complete household tasks.     Time  6    Period  Weeks    Status  Achieved      PT LONG TERM GOAL #3   Title  PT ROM of hip and back to improve to allow pt to be able to don shoes and socks without pain     Time  6    Period  Weeks    Status  Achieved      PT LONG TERM GOAL #4   Title  PT to be able to single leg balance on both LE for 15 seconds to allow pt to feel confident walking on uneven ground.     Time  6    Period  Weeks    Status  On-going            Plan - 12/01/18 1609    Clinical Impression Statement  PT reassessed today.  Evaluation was for low back pain and pt currently has not experienced any pain in the past week.  PT has improved in all aspects and is ready to be discharged to a HEP at this time.     Rehab Potential  Good    PT Frequency  3x / week    PT Duration  6 weeks    PT Treatment/Interventions  ADLs/Self Care Home Management;Patient/family education;Therapeutic exercise;Therapeutic activities;Balance training;Functional mobility  training;Stair training;Gait training;DME Instruction;Manual techniques    PT  Next Visit Plan  Discharge.    PT Home Exercise Plan  sitting tall with scapular retraction, decompression 1-3; 1/20:  bend knee lift , clams; 11/03/2018 -  decompression exercises 1-5 and 1-4 w/ RTB.        Patient will benefit from skilled therapeutic intervention in order to improve the following deficits and impairments:  Abnormal gait, Decreased activity tolerance, Decreased balance, Decreased mobility, Decreased strength, Decreased range of motion, Difficulty walking, Postural dysfunction, Pain, Improper body mechanics  Visit Diagnosis: Difficulty in walking, not elsewhere classified  Acute bilateral low back pain without sciatica     Problem List Patient Active Problem List   Diagnosis Date Noted  . Goals of care, counseling/discussion   . Acute blood loss anemia 09/28/2016  . CAD (coronary artery disease)   . Pancytopenia ([place])   . Epistaxis, recurrent   . Hyperuricemia   . Acute myeloid leukemia in adult [facility])   . AKI (acute kidney injury) ([place])   . Troponin level elevated   . Leukocytosis   . Lobar pneumonia, unspecified organism ([place])   . Acute on chronic kidney failure ([place]) 09/04/2016  . Acute on chronic systolic CHF (congestive heart failure) ([place]) 09/04/2016  . Thrombocytopenia ([place]) 09/04/2016  . Cellulitis 09/04/2016  . Chronic combined systolic and diastolic CHF (congestive heart failure) ([place]) 09/04/2016  . Paroxysmal atrial fibrillation ([facility])   . Cricopharyngeal dysphagia 11/06/2015  . Esophageal dysphagia 11/06/2015  . Carotid disease, bilateral ([place]) 12/05/2014  . TIA (transient ischemic attack) 12/04/2014  . MGUS (monoclonal gammopathy of unknown significance) 03/29/2011  . Renal cancer ([place]) 03/29/2011    Class: Diagnosis of  . Shortness of breath 12/24/2010  . EDEMA 11/17/2010  . HEADACHE 10/28/2009  . Coronary artery disease due to lipid rich plaque 09/25/2009  .  NOSEBLEED 09/22/2009  . Hypothyroidism, acquired 06/29/2009  . DYSLIPIDEMIA 06/29/2009  . Essential hypertension 06/29/2009  . CHEST PAIN 06/29/2009    Rayetta Humphrey, PT CLT [phone] 12/01/2018, 4:11 PM  [facility] [address] [place], [place], [address] Phone: [phone]   Fax:  [phone]  Name: Brian Mcmillan MRN: [id number] Date of Birth: Jun 25, 1925

## 2018-12-04 ENCOUNTER — Ambulatory Visit (HOSPITAL_COMMUNITY): Payer: Medicare Other | Admitting: Physical Therapy

## 2018-12-06 ENCOUNTER — Encounter (HOSPITAL_COMMUNITY): Payer: Medicare Other | Admitting: Physical Therapy

## 2018-12-08 ENCOUNTER — Encounter (HOSPITAL_COMMUNITY): Payer: Medicare Other | Admitting: Physical Therapy

## 2018-12-11 ENCOUNTER — Ambulatory Visit (HOSPITAL_COMMUNITY): Payer: Medicare Other | Admitting: Physical Therapy

## 2018-12-13 ENCOUNTER — Encounter (HOSPITAL_COMMUNITY): Payer: Medicare Other | Admitting: Physical Therapy

## 2018-12-15 ENCOUNTER — Encounter (HOSPITAL_COMMUNITY): Payer: Medicare Other | Admitting: Physical Therapy

## 2018-12-19 DIAGNOSIS — D696 Thrombocytopenia, unspecified: Secondary | ICD-10-CM | POA: Diagnosis not present

## 2018-12-19 DIAGNOSIS — J449 Chronic obstructive pulmonary disease, unspecified: Secondary | ICD-10-CM | POA: Diagnosis not present

## 2018-12-19 DIAGNOSIS — I519 Heart disease, unspecified: Secondary | ICD-10-CM | POA: Diagnosis not present

## 2018-12-19 DIAGNOSIS — D472 Monoclonal gammopathy: Secondary | ICD-10-CM | POA: Diagnosis not present

## 2018-12-19 DIAGNOSIS — D72819 Decreased white blood cell count, unspecified: Secondary | ICD-10-CM | POA: Diagnosis not present

## 2018-12-19 DIAGNOSIS — C9200 Acute myeloblastic leukemia, not having achieved remission: Secondary | ICD-10-CM | POA: Diagnosis not present

## 2018-12-19 DIAGNOSIS — I252 Old myocardial infarction: Secondary | ICD-10-CM | POA: Diagnosis not present

## 2018-12-19 DIAGNOSIS — I255 Ischemic cardiomyopathy: Secondary | ICD-10-CM | POA: Diagnosis not present

## 2018-12-20 DIAGNOSIS — L821 Other seborrheic keratosis: Secondary | ICD-10-CM | POA: Diagnosis not present

## 2018-12-20 DIAGNOSIS — C9590 Leukemia, unspecified not having achieved remission: Secondary | ICD-10-CM | POA: Diagnosis not present

## 2018-12-20 DIAGNOSIS — D485 Neoplasm of uncertain behavior of skin: Secondary | ICD-10-CM | POA: Diagnosis not present

## 2018-12-20 DIAGNOSIS — D489 Neoplasm of uncertain behavior, unspecified: Secondary | ICD-10-CM | POA: Diagnosis not present

## 2018-12-26 DIAGNOSIS — I252 Old myocardial infarction: Secondary | ICD-10-CM | POA: Diagnosis not present

## 2018-12-26 DIAGNOSIS — D472 Monoclonal gammopathy: Secondary | ICD-10-CM | POA: Diagnosis not present

## 2018-12-26 DIAGNOSIS — I255 Ischemic cardiomyopathy: Secondary | ICD-10-CM | POA: Diagnosis not present

## 2018-12-26 DIAGNOSIS — C9200 Acute myeloblastic leukemia, not having achieved remission: Secondary | ICD-10-CM | POA: Diagnosis not present

## 2018-12-26 DIAGNOSIS — I519 Heart disease, unspecified: Secondary | ICD-10-CM | POA: Diagnosis not present

## 2018-12-26 DIAGNOSIS — J449 Chronic obstructive pulmonary disease, unspecified: Secondary | ICD-10-CM | POA: Diagnosis not present

## 2019-01-02 DIAGNOSIS — I255 Ischemic cardiomyopathy: Secondary | ICD-10-CM | POA: Diagnosis not present

## 2019-01-02 DIAGNOSIS — D472 Monoclonal gammopathy: Secondary | ICD-10-CM | POA: Diagnosis not present

## 2019-01-02 DIAGNOSIS — I252 Old myocardial infarction: Secondary | ICD-10-CM | POA: Diagnosis not present

## 2019-01-02 DIAGNOSIS — J449 Chronic obstructive pulmonary disease, unspecified: Secondary | ICD-10-CM | POA: Diagnosis not present

## 2019-01-02 DIAGNOSIS — C9200 Acute myeloblastic leukemia, not having achieved remission: Secondary | ICD-10-CM | POA: Diagnosis not present

## 2019-01-02 DIAGNOSIS — I519 Heart disease, unspecified: Secondary | ICD-10-CM | POA: Diagnosis not present

## 2019-01-09 DIAGNOSIS — J449 Chronic obstructive pulmonary disease, unspecified: Secondary | ICD-10-CM | POA: Diagnosis not present

## 2019-01-09 DIAGNOSIS — C9200 Acute myeloblastic leukemia, not having achieved remission: Secondary | ICD-10-CM | POA: Diagnosis not present

## 2019-01-09 DIAGNOSIS — I252 Old myocardial infarction: Secondary | ICD-10-CM | POA: Diagnosis not present

## 2019-01-09 DIAGNOSIS — I519 Heart disease, unspecified: Secondary | ICD-10-CM | POA: Diagnosis not present

## 2019-01-09 DIAGNOSIS — D472 Monoclonal gammopathy: Secondary | ICD-10-CM | POA: Diagnosis not present

## 2019-01-09 DIAGNOSIS — I255 Ischemic cardiomyopathy: Secondary | ICD-10-CM | POA: Diagnosis not present

## 2019-01-16 DIAGNOSIS — C9200 Acute myeloblastic leukemia, not having achieved remission: Secondary | ICD-10-CM | POA: Diagnosis not present

## 2019-01-30 DIAGNOSIS — C9200 Acute myeloblastic leukemia, not having achieved remission: Secondary | ICD-10-CM | POA: Diagnosis not present

## 2019-02-13 DIAGNOSIS — D72819 Decreased white blood cell count, unspecified: Secondary | ICD-10-CM | POA: Diagnosis not present

## 2019-02-13 DIAGNOSIS — D696 Thrombocytopenia, unspecified: Secondary | ICD-10-CM | POA: Diagnosis not present

## 2019-02-13 DIAGNOSIS — C9200 Acute myeloblastic leukemia, not having achieved remission: Secondary | ICD-10-CM | POA: Diagnosis not present

## 2019-02-27 DIAGNOSIS — C9200 Acute myeloblastic leukemia, not having achieved remission: Secondary | ICD-10-CM | POA: Diagnosis not present

## 2019-03-20 DIAGNOSIS — C9200 Acute myeloblastic leukemia, not having achieved remission: Secondary | ICD-10-CM | POA: Diagnosis not present

## 2019-03-20 DIAGNOSIS — R799 Abnormal finding of blood chemistry, unspecified: Secondary | ICD-10-CM | POA: Diagnosis not present

## 2019-04-16 DIAGNOSIS — Z856 Personal history of leukemia: Secondary | ICD-10-CM | POA: Diagnosis not present

## 2019-04-16 DIAGNOSIS — Z08 Encounter for follow-up examination after completed treatment for malignant neoplasm: Secondary | ICD-10-CM | POA: Diagnosis not present

## 2019-04-16 DIAGNOSIS — R21 Rash and other nonspecific skin eruption: Secondary | ICD-10-CM | POA: Diagnosis not present

## 2019-04-16 DIAGNOSIS — C9200 Acute myeloblastic leukemia, not having achieved remission: Secondary | ICD-10-CM | POA: Diagnosis not present

## 2019-04-17 DIAGNOSIS — B351 Tinea unguium: Secondary | ICD-10-CM | POA: Diagnosis not present

## 2019-04-17 DIAGNOSIS — M79676 Pain in unspecified toe(s): Secondary | ICD-10-CM | POA: Diagnosis not present

## 2019-04-17 DIAGNOSIS — I70203 Unspecified atherosclerosis of native arteries of extremities, bilateral legs: Secondary | ICD-10-CM | POA: Diagnosis not present

## 2019-04-17 DIAGNOSIS — L84 Corns and callosities: Secondary | ICD-10-CM | POA: Diagnosis not present

## 2019-05-14 DIAGNOSIS — I5022 Chronic systolic (congestive) heart failure: Secondary | ICD-10-CM | POA: Diagnosis not present

## 2019-05-14 DIAGNOSIS — I255 Ischemic cardiomyopathy: Secondary | ICD-10-CM | POA: Diagnosis not present

## 2019-05-14 DIAGNOSIS — E782 Mixed hyperlipidemia: Secondary | ICD-10-CM | POA: Diagnosis not present

## 2019-05-14 DIAGNOSIS — N182 Chronic kidney disease, stage 2 (mild): Secondary | ICD-10-CM | POA: Diagnosis not present

## 2019-05-14 DIAGNOSIS — E1122 Type 2 diabetes mellitus with diabetic chronic kidney disease: Secondary | ICD-10-CM | POA: Diagnosis not present

## 2019-05-14 DIAGNOSIS — I130 Hypertensive heart and chronic kidney disease with heart failure and stage 1 through stage 4 chronic kidney disease, or unspecified chronic kidney disease: Secondary | ICD-10-CM | POA: Diagnosis not present

## 2019-05-14 DIAGNOSIS — I25118 Atherosclerotic heart disease of native coronary artery with other forms of angina pectoris: Secondary | ICD-10-CM | POA: Diagnosis not present

## 2019-05-15 DIAGNOSIS — C9200 Acute myeloblastic leukemia, not having achieved remission: Secondary | ICD-10-CM | POA: Diagnosis not present

## 2019-05-15 DIAGNOSIS — D696 Thrombocytopenia, unspecified: Secondary | ICD-10-CM | POA: Diagnosis not present

## 2019-05-15 DIAGNOSIS — D72810 Lymphocytopenia: Secondary | ICD-10-CM | POA: Diagnosis not present

## 2019-05-15 DIAGNOSIS — D472 Monoclonal gammopathy: Secondary | ICD-10-CM | POA: Diagnosis not present

## 2019-06-26 DIAGNOSIS — C9200 Acute myeloblastic leukemia, not having achieved remission: Secondary | ICD-10-CM | POA: Diagnosis not present

## 2019-06-26 DIAGNOSIS — D72810 Lymphocytopenia: Secondary | ICD-10-CM | POA: Diagnosis not present

## 2019-06-26 DIAGNOSIS — M79676 Pain in unspecified toe(s): Secondary | ICD-10-CM | POA: Diagnosis not present

## 2019-06-26 DIAGNOSIS — D696 Thrombocytopenia, unspecified: Secondary | ICD-10-CM | POA: Diagnosis not present

## 2019-06-26 DIAGNOSIS — L84 Corns and callosities: Secondary | ICD-10-CM | POA: Diagnosis not present

## 2019-06-26 DIAGNOSIS — I70203 Unspecified atherosclerosis of native arteries of extremities, bilateral legs: Secondary | ICD-10-CM | POA: Diagnosis not present

## 2019-06-26 DIAGNOSIS — B351 Tinea unguium: Secondary | ICD-10-CM | POA: Diagnosis not present

## 2019-06-26 DIAGNOSIS — D472 Monoclonal gammopathy: Secondary | ICD-10-CM | POA: Diagnosis not present

## 2019-07-24 ENCOUNTER — Encounter (INDEPENDENT_AMBULATORY_CARE_PROVIDER_SITE_OTHER): Payer: Self-pay | Admitting: Internal Medicine

## 2019-07-24 ENCOUNTER — Other Ambulatory Visit: Payer: Self-pay

## 2019-07-24 ENCOUNTER — Ambulatory Visit (INDEPENDENT_AMBULATORY_CARE_PROVIDER_SITE_OTHER): Payer: Medicare Other | Admitting: Internal Medicine

## 2019-07-24 VITALS — BP 196/96 | HR 54 | Temp 97.5°F | Ht 74.0 in | Wt 181.2 lb

## 2019-07-24 DIAGNOSIS — R1319 Other dysphagia: Secondary | ICD-10-CM

## 2019-07-24 DIAGNOSIS — R1310 Dysphagia, unspecified: Secondary | ICD-10-CM

## 2019-07-24 DIAGNOSIS — R490 Dysphonia: Secondary | ICD-10-CM | POA: Insufficient documentation

## 2019-07-24 DIAGNOSIS — K219 Gastro-esophageal reflux disease without esophagitis: Secondary | ICD-10-CM | POA: Insufficient documentation

## 2019-07-24 MED ORDER — FAMOTIDINE 40 MG PO TABS: 40.0000 mg | ORAL_TABLET | Freq: Every day | ORAL | 5 refills | Status: DC

## 2019-07-24 NOTE — Progress Notes (Signed)
Presenting complaint;  Follow for chronic GERD and dysphagia.  Database and subjective:  Patient is 85 year old Caucasian male who has history of GERD and esophageal dysphagia suspected to be due to esophageal motility disorder who is here for yearly visit. He rarely has heartburn.  He recalls that he used to have frequent heartburn when he was drinking alcohol socially.  He denies dysphagia nausea or vomiting.  He does complain of intermittent hoarseness which started few months ago but is getting worse and more so in the afternoon.  He denies chest pain or shortness of breath.  He has chronic back pain.  He is using cane to move around.  He says his appetite is normal.  He has lost 8 pounds since his last visit.  He feels weight loss is because he is not eating like he has been because his wife is not doing well.  Meals are generally prepared by his daughter-in-law and they also have hot meal from church once a week and also family friends providing him with meals frequently.  He says bowels move twice daily.  He does not have diarrhea or constipation.  He also denies melena or rectal bleeding. He is not having any side effects with pantoprazole.  Current Medications: Outpatient Encounter Medications as of 07/24/2019  Medication Sig  . acetaminophen (TYLENOL) 500 MG tablet Take 500 mg by mouth every 6 (six) hours as needed for moderate pain.  Marland Kitchen albuterol-ipratropium (COMBIVENT) 18-103 MCG/ACT inhaler Inhale 2-4 puffs into the lungs 2 (two) times daily as needed for wheezing.   Marland Kitchen allopurinol (ZYLOPRIM) 100 MG tablet Take 1 tablet (100 mg total) by mouth 2 (two) times daily.  . carvedilol (COREG) 6.25 MG tablet Take 6.25 mg by mouth 2 (two) times daily with a meal.    . Cholecalciferol (VITAMIN D3) 5000 UNITS CAPS Take 5,000 Units by mouth daily.   . fluticasone (FLONASE) 50 MCG/ACT nasal spray Place 2 sprays into both nostrils at bedtime as needed for allergies.   . furosemide (LASIX) 40 MG  tablet Take 20 mg by mouth daily.   . hydroxyurea (HYDREA) 500 MG capsule Take 500 mg by mouth daily. At 4:30 pm  . ipratropium (ATROVENT) 0.03 % nasal spray Place 2 sprays into both nostrils 3 (three) times daily.   Marland Kitchen levothyroxine (SYNTHROID, LEVOTHROID) 125 MCG tablet Take 125 mcg by mouth daily.   . montelukast (SINGULAIR) 10 MG tablet Take 10 mg by mouth at bedtime.   . nitroGLYCERIN (NITRODUR - DOSED IN MG/24 HR) 0.6 mg/hr patch Place 0.6 mg onto the skin daily.  . nitroGLYCERIN (NITROSTAT) 0.4 MG SL tablet Place 0.4 mg under the tongue every 5 (five) minutes as needed for chest pain.  . Omega-3 Fatty Acids (FISH OIL) 1000 MG CPDR Take 1,000 mg by mouth daily.  . pantoprazole (PROTONIX) 40 MG tablet Take 1 tablet (40 mg total) by mouth daily before breakfast.  . pravastatin (PRAVACHOL) 20 MG tablet Take 20 mg by mouth every evening.  . tamsulosin (FLOMAX) 0.4 MG CAPS capsule Take 0.4 mg by mouth daily.  Marland Kitchen tiotropium (SPIRIVA) 18 MCG inhalation capsule Place 18 mcg into inhaler and inhale daily.  . vitamin B-12 (CYANOCOBALAMIN) 1000 MCG tablet Take 1,000 mcg by mouth daily.     No facility-administered encounter medications on file as of 07/24/2019.      Objective: Blood pressure (!) 196/96, pulse (!) 54, temperature (!) 97.5 F (36.4 C), temperature source Oral, height 6\' 2"  (1.88 m), weight 181 lb  3.2 oz (82.2 kg). Patient is alert and in no acute distress.  He is using cane to ambulate. He is hoarse. Conjunctiva is pink. Sclera is nonicteric Oropharyngeal mucosa is normal. No neck masses or thyromegaly noted. Cardiac exam with regular rhythm normal S1 and S2. No murmur or gallop noted. Lungs are clear to auscultation. Abdomen is symmetrical.  He has small umbilical hernia which is completely reducible.  He has low midline scar.  On palpation abdomen is soft and nontender with organomegaly or masses. No LE edema or clubbing noted.   Assessment:  #1.  Chronic GERD.  Typical  symptoms are well controlled but he is having frequent hoarseness.  It remains to be seen if it is due to GERD or other etiologies.  #2.  Esophageal dysphagia.  He is suspected to have esophageal motility disorder.  He has not had any major issues.  Therefore we will continue to monitor the symptoms.   Plan:  Continue pantoprazole 40 mg by mouth 30 minutes before breakfast daily as before. Famotidine 40 mg by mouth daily after evening meal or at bedtime. Patient will call office with progress report in 4 to 6 weeks.  If hoarseness improved with addition of famotidine he will continue that for few months.  If hoarseness does not improve will discontinue famotidine and arrange for ENT consultation. Follow-up in 1 year.

## 2019-07-24 NOTE — Patient Instructions (Signed)
Take famotidine/Pepcid 40 mg by mouth daily after evening meal or at bedtime. Continue pantoprazole as before which is 30 minutes before breakfast daily. Please call office with progress report regarding hoarseness in 4 to 6 weeks. If no improvement noted with famotidine/Pepcid we will discontinue this medication and arrange for ENT consultation. Follow-up with primary care physician regarding hypertension.

## 2019-07-25 DIAGNOSIS — C9200 Acute myeloblastic leukemia, not having achieved remission: Secondary | ICD-10-CM | POA: Diagnosis not present

## 2019-07-25 DIAGNOSIS — L982 Febrile neutrophilic dermatosis [Sweet]: Secondary | ICD-10-CM | POA: Diagnosis not present

## 2019-07-25 DIAGNOSIS — E1165 Type 2 diabetes mellitus with hyperglycemia: Secondary | ICD-10-CM | POA: Diagnosis not present

## 2019-07-31 ENCOUNTER — Other Ambulatory Visit (INDEPENDENT_AMBULATORY_CARE_PROVIDER_SITE_OTHER): Payer: Self-pay | Admitting: Internal Medicine

## 2019-07-31 DIAGNOSIS — C9200 Acute myeloblastic leukemia, not having achieved remission: Secondary | ICD-10-CM | POA: Diagnosis not present

## 2019-07-31 DIAGNOSIS — E1165 Type 2 diabetes mellitus with hyperglycemia: Secondary | ICD-10-CM | POA: Diagnosis not present

## 2019-07-31 DIAGNOSIS — L982 Febrile neutrophilic dermatosis [Sweet]: Secondary | ICD-10-CM | POA: Diagnosis not present

## 2019-07-31 DIAGNOSIS — D539 Nutritional anemia, unspecified: Secondary | ICD-10-CM | POA: Diagnosis not present

## 2019-07-31 DIAGNOSIS — D696 Thrombocytopenia, unspecified: Secondary | ICD-10-CM | POA: Diagnosis not present

## 2019-07-31 MED ORDER — FAMOTIDINE 20 MG PO TABS: 20.0000 mg | ORAL_TABLET | Freq: Every day | ORAL | 3 refills | Status: DC

## 2019-07-31 NOTE — Progress Notes (Signed)
Patient states he had epigastric pain with famotidine 40 mg. He woke up at 4 AM.  He had to eat a bowl of cereal and had his sugar go up. I do not believe that his symptom is due to higher dose of famotidine but will change him back to 20 mg p.o. nightly.  He asked a prescription be sent to regional pharmacy which I have done.

## 2019-08-21 DIAGNOSIS — C9200 Acute myeloblastic leukemia, not having achieved remission: Secondary | ICD-10-CM | POA: Diagnosis not present

## 2019-08-21 DIAGNOSIS — Z79899 Other long term (current) drug therapy: Secondary | ICD-10-CM | POA: Diagnosis not present

## 2019-11-13 ENCOUNTER — Other Ambulatory Visit: Payer: Self-pay

## 2019-11-13 ENCOUNTER — Ambulatory Visit: Payer: Medicare Other | Attending: Internal Medicine

## 2019-11-13 DIAGNOSIS — Z20822 Contact with and (suspected) exposure to covid-19: Secondary | ICD-10-CM

## 2019-11-14 LAB — NOVEL CORONAVIRUS, NAA: SARS-CoV-2, NAA: DETECTED — AB

## 2019-11-15 ENCOUNTER — Other Ambulatory Visit (HOSPITAL_COMMUNITY): Payer: Self-pay | Admitting: Nurse Practitioner

## 2019-11-15 ENCOUNTER — Telehealth (HOSPITAL_COMMUNITY): Payer: Self-pay | Admitting: Nurse Practitioner

## 2019-11-15 DIAGNOSIS — U071 COVID-19: Secondary | ICD-10-CM

## 2019-11-15 NOTE — Telephone Encounter (Signed)
[place] o patient's son regarding patient's symptoms. Patient qualifies and owuld like to get MAB treatment. See orders encounter.

## 2019-11-15 NOTE — Progress Notes (Signed)
I connected by phone with Darci Needle on 11/15/2019 at 1:30 PM to discuss the potential use of an new treatment for mild to moderate COVID-19 viral infection in non-hospitalized patients.  This patient is a 86 y.o. male that meets the FDA criteria for Emergency Use Authorization of bamlanivimab or casirivimab\imdevimab.  Has a (+) direct SARS-CoV-2 viral test result  Has mild or moderate COVID-19   Is ? 86 years of age and weighs ? 40 kg  Is NOT hospitalized due to COVID-19  Is NOT requiring oxygen therapy or requiring an increase in baseline oxygen flow rate due to COVID-19  Is within 10 days of symptom onset  Has at least one of the high risk factor(s) for progression to severe COVID-19 and/or hospitalization as defined in EUA.  Specific high risk criteria : >/= 84 yo  I have spoken and communicated the following to the patient or parent/caregiver:  1. FDA has authorized the emergency use of bamlanivimab and casirivimab\imdevimab for the treatment of mild to moderate COVID-19 in adults and pediatric patients with positive results of direct SARS-CoV-2 viral testing who are 22 years of age and older weighing at least 40 kg, and who are at high risk for progressing to severe COVID-19 and/or hospitalization.  2. The significant known and potential risks and benefits of bamlanivimab and casirivimab\imdevimab, and the extent to which such potential risks and benefits are unknown.  3. Information on available alternative treatments and the risks and benefits of those alternatives, including clinical trials.  4. Patients treated with bamlanivimab and casirivimab\imdevimab should continue to self-isolate and use infection control measures (e.g., wear mask, isolate, social distance, avoid sharing personal items, clean and disinfect "high touch" surfaces, and frequent handwashing) according to CDC guidelines.   5. The patient or parent/caregiver has the option to accept or refuse bamlanivimab  or casirivimab\imdevimab .  After reviewing this information with the patient, The patient agreed to proceed with receiving the casirivimab\imdevimab infusion and will be provided a copy of the Fact sheet prior to receiving the infusion.Beckey Rutter, [place], AGNP-C [phone] ([place])

## 2019-11-16 ENCOUNTER — Ambulatory Visit (HOSPITAL_COMMUNITY)
Admission: RE | Admit: 2019-11-16 | Discharge: 2019-11-16 | Disposition: A | Discharge status: Home / Self Care | Payer: Medicare Other | Source: Ambulatory Visit | Attending: Pulmonary Disease | Admitting: Pulmonary Disease

## 2019-11-16 ENCOUNTER — Encounter (HOSPITAL_COMMUNITY): Payer: Self-pay

## 2019-11-16 DIAGNOSIS — Z23 Encounter for immunization: Secondary | ICD-10-CM | POA: Insufficient documentation

## 2019-11-16 DIAGNOSIS — U071 COVID-19: Secondary | ICD-10-CM | POA: Diagnosis present

## 2019-11-16 MED ORDER — EPINEPHRINE 0.3 MG/0.3ML IJ SOAJ: 0.3000 mg | Freq: Once | INTRAMUSCULAR | Status: DC | PRN

## 2019-11-16 MED ORDER — FAMOTIDINE IN NACL 20-0.9 MG/50ML-% IV SOLN: 20.0000 mg | Freq: Once | INTRAVENOUS | Status: DC | PRN

## 2019-11-16 MED ORDER — ALBUTEROL SULFATE HFA 108 (90 BASE) MCG/ACT IN AERS: 2.0000 | INHALATION_SPRAY | Freq: Once | RESPIRATORY_TRACT | Status: DC | PRN

## 2019-11-16 MED ORDER — SODIUM CHLORIDE 0.9 % IV SOLN
Freq: Once | INTRAVENOUS | Status: AC
  Filled 2019-11-16: qty 10

## 2019-11-16 MED ORDER — METHYLPREDNISOLONE SODIUM SUCC 125 MG IJ SOLR: 125.0000 mg | Freq: Once | INTRAMUSCULAR | Status: DC | PRN

## 2019-11-16 MED ORDER — DIPHENHYDRAMINE HCL 50 MG/ML IJ SOLN: 50.0000 mg | Freq: Once | INTRAMUSCULAR | Status: DC | PRN

## 2019-11-16 MED ORDER — SODIUM CHLORIDE 0.9 % IV SOLN
INTRAVENOUS | Status: DC | PRN
  Administered 2019-11-16: 250 mL via INTRAVENOUS

## 2019-11-16 NOTE — Discharge Instructions (Signed)

## 2019-11-16 NOTE — Progress Notes (Signed)
Wheeled pt out to car by this Therapist, sports. Met son, Richardson Landry at car. Reports pt missed his afternoon dose of BP medication and will give it to him when he gets home.

## 2019-11-16 NOTE — Progress Notes (Signed)
  Diagnosis: COVID-19  Physician: Dr. Patrick Wright  Procedure: [facility] Med: casirivimab\imdevimab infusion - Provided patient with casirivimab\imdevimab fact sheet for patients, parents and caregivers prior to infusion.  Complications: No immediate complications noted.  Discharge: Discharged home   Ally Yow 11/16/2019   

## 2020-01-10 ENCOUNTER — Emergency Department (HOSPITAL_COMMUNITY)
Admission: EM | Admit: 2020-01-10 | Discharge: 2020-01-10 | Disposition: A | Discharge status: Home / Self Care | Payer: Medicare Other | Attending: Emergency Medicine | Admitting: Emergency Medicine

## 2020-01-10 ENCOUNTER — Other Ambulatory Visit: Payer: Self-pay

## 2020-01-10 ENCOUNTER — Encounter (HOSPITAL_COMMUNITY): Payer: Self-pay

## 2020-01-10 ENCOUNTER — Emergency Department (HOSPITAL_COMMUNITY): Payer: Medicare Other

## 2020-01-10 DIAGNOSIS — N189 Chronic kidney disease, unspecified: Secondary | ICD-10-CM | POA: Insufficient documentation

## 2020-01-10 DIAGNOSIS — I2510 Atherosclerotic heart disease of native coronary artery without angina pectoris: Secondary | ICD-10-CM | POA: Insufficient documentation

## 2020-01-10 DIAGNOSIS — Z79899 Other long term (current) drug therapy: Secondary | ICD-10-CM | POA: Diagnosis not present

## 2020-01-10 DIAGNOSIS — I5020 Unspecified systolic (congestive) heart failure: Secondary | ICD-10-CM

## 2020-01-10 DIAGNOSIS — I130 Hypertensive heart and chronic kidney disease with heart failure and stage 1 through stage 4 chronic kidney disease, or unspecified chronic kidney disease: Secondary | ICD-10-CM | POA: Insufficient documentation

## 2020-01-10 DIAGNOSIS — E039 Hypothyroidism, unspecified: Secondary | ICD-10-CM | POA: Diagnosis not present

## 2020-01-10 DIAGNOSIS — Z8616 Personal history of COVID-19: Secondary | ICD-10-CM | POA: Insufficient documentation

## 2020-01-10 DIAGNOSIS — Z87891 Personal history of nicotine dependence: Secondary | ICD-10-CM | POA: Diagnosis not present

## 2020-01-10 DIAGNOSIS — R0602 Shortness of breath: Secondary | ICD-10-CM | POA: Diagnosis present

## 2020-01-10 DIAGNOSIS — I5022 Chronic systolic (congestive) heart failure: Secondary | ICD-10-CM | POA: Insufficient documentation

## 2020-01-10 DIAGNOSIS — J441 Chronic obstructive pulmonary disease with (acute) exacerbation: Secondary | ICD-10-CM | POA: Diagnosis not present

## 2020-01-10 HISTORY — DX: COVID-19: U07.1

## 2020-01-10 LAB — TROPONIN I (HIGH SENSITIVITY)
Troponin I (High Sensitivity): 17 ng/L (ref <18)
Troponin I (High Sensitivity): 18 ng/L — ABNORMAL HIGH (ref <18)

## 2020-01-10 LAB — COMPREHENSIVE METABOLIC PANEL
ALT: 15 U/L (ref 0–44)
AST: 33 U/L (ref 15–41)
Albumin: 3.2 g/dL — ABNORMAL LOW (ref 3.5–5.0)
Alkaline Phosphatase: 101 U/L (ref 38–126)
Anion gap: 5 (ref 5–15)
BUN: 21 mg/dL (ref 8–23)
CO2: 29 mmol/L (ref 22–32)
Calcium: 9.2 mg/dL (ref 8.9–10.3)
Chloride: 104 mmol/L (ref 98–111)
Creatinine, Ser: 1.33 mg/dL — ABNORMAL HIGH (ref 0.61–1.24)
GFR calc Af Amer: 53 mL/min — ABNORMAL LOW (ref >60)
GFR calc non Af Amer: 46 mL/min — ABNORMAL LOW (ref >60)
Glucose, Bld: 101 mg/dL — ABNORMAL HIGH (ref 70–99)
Potassium: 3.8 mmol/L (ref 3.5–5.1)
Sodium: 138 mmol/L (ref 135–145)
Total Bilirubin: 1.7 mg/dL — ABNORMAL HIGH (ref 0.3–1.2)
Total Protein: 8 g/dL (ref 6.5–8.1)

## 2020-01-10 LAB — CBC WITH DIFFERENTIAL/PLATELET
Abs Immature Granulocytes: 0.02 10*3/uL (ref 0.00–0.07)
Basophils Absolute: 0 10*3/uL (ref 0.0–0.1)
Basophils Relative: 0 %
Eosinophils Absolute: 0 10*3/uL (ref 0.0–0.5)
Eosinophils Relative: 1 %
HCT: 37.6 % — ABNORMAL LOW (ref 39.0–52.0)
Hemoglobin: 12 g/dL — ABNORMAL LOW (ref 13.0–17.0)
Immature Granulocytes: 1 %
Lymphocytes Relative: 45 %
Lymphs Abs: 1.3 10*3/uL (ref 0.7–4.0)
MCH: 32.9 pg (ref 26.0–34.0)
MCHC: 31.9 g/dL (ref 30.0–36.0)
MCV: 103 fL — ABNORMAL HIGH (ref 80.0–100.0)
Monocytes Absolute: 0.7 10*3/uL (ref 0.1–1.0)
Monocytes Relative: 26 %
Neutro Abs: 0.8 10*3/uL — ABNORMAL LOW (ref 1.7–7.7)
Neutrophils Relative %: 27 %
Platelets: 94 10*3/uL — ABNORMAL LOW (ref 150–400)
RBC: 3.65 MIL/uL — ABNORMAL LOW (ref 4.22–5.81)
RDW: 16.7 % — ABNORMAL HIGH (ref 11.5–15.5)
WBC: 2.8 10*3/uL — ABNORMAL LOW (ref 4.0–10.5)
nRBC: 0 % (ref 0.0–0.2)

## 2020-01-10 LAB — BRAIN NATRIURETIC PEPTIDE: B Natriuretic Peptide: 816 pg/mL — ABNORMAL HIGH (ref 0.0–100.0)

## 2020-01-10 MED ORDER — FUROSEMIDE 40 MG PO TABS
40.0000 mg | ORAL_TABLET | Freq: Once | ORAL | Status: AC
  Administered 2020-01-10: 40 mg via ORAL
  Filled 2020-01-10: qty 1

## 2020-01-10 MED ORDER — FUROSEMIDE 10 MG/ML IJ SOLN
40.0000 mg | Freq: Once | INTRAMUSCULAR | Status: AC
  Administered 2020-01-10: 40 mg via INTRAVENOUS
  Filled 2020-01-10: qty 4

## 2020-01-10 MED ORDER — ALBUTEROL SULFATE HFA 108 (90 BASE) MCG/ACT IN AERS
2.0000 | INHALATION_SPRAY | RESPIRATORY_TRACT | Status: DC | PRN
  Administered 2020-01-10: 2 via RESPIRATORY_TRACT
  Filled 2020-01-10: qty 6.7

## 2020-01-10 MED ORDER — PREDNISONE 50 MG PO TABS
60.0000 mg | ORAL_TABLET | Freq: Once | ORAL | Status: AC
  Administered 2020-01-10: 60 mg via ORAL
  Filled 2020-01-10: qty 1

## 2020-01-10 MED ORDER — PREDNISONE 20 MG PO TABS: ORAL_TABLET | ORAL | 0 refills | Status: DC

## 2020-01-10 MED ORDER — CARVEDILOL 12.5 MG PO TABS
6.2500 mg | ORAL_TABLET | Freq: Two times a day (BID) | ORAL | Status: DC
  Administered 2020-01-10: 6.25 mg via ORAL
  Filled 2020-01-10: qty 1

## 2020-01-10 NOTE — Discharge Instructions (Addendum)
Continue using your inhalers.  Follow-up with your family doctor next week.  Return sooner if any problems

## 2020-01-10 NOTE — ED Triage Notes (Signed)
Pt woke up this am feeling like he could not breathe. Dr Gerarda Fraction sent pt here for fluid retention r/t CHF. Pt is on a daily Lasix pill and cancer pill for Leukemia.   Pt endorses shortness of breath at this time.   Pt has not taken any daily meds today.

## 2020-01-10 NOTE — ED Notes (Signed)
Pt given water per request.  Pt assisted with getting dressed, as pt is up for discharge. PT family at bedside.

## 2020-01-11 NOTE — ED Provider Notes (Signed)
[facility] Provider Note   CSN: [id number] Arrival date & time: 01/10/20  1456     History Chief Complaint  Patient presents with  . Shortness of Breath    Brian Mcmillan is a 86 y.o. male.  Patient complains of some shortness of breath.  Patient has a history of congestive heart failure.  The history is provided by the patient. No language interpreter was used.  Shortness of Breath Severity:  Mild Onset quality:  Gradual Timing:  Intermittent Progression:  Waxing and waning Chronicity:  Recurrent Context: activity   Relieved by:  Nothing Worsened by:  Nothing Ineffective treatments:  None tried Associated symptoms: no abdominal pain, no chest pain, no cough, no headaches and no rash        Past Medical History:  Diagnosis Date  . Abdominal mass 2008   chronic inflammatory mass of the mesentary Dr [name]  . 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 Anemia   . Anginal pain ([place])   . CAD (coronary artery disease)    cath 09/23/09 70% Dx1 (diffuse disease), 40% Cx, Large RCA 80-90% with heavy calcification / cath 06/2010 no change tiht RCA still best to treat medially with nosebleeds  . Chest pain    myoview 2006 no ischemia/ myoview 2009 no ischemia  . CHF (congestive heart failure) ([place])   . CKD (chronic kidney disease)   . COPD (chronic obstructive pulmonary disease) ([place])   . COVID-19   . Dyslipidemia   . GERD (gastroesophageal reflux disease)   . Gout   . History of bleeding ulcers    "had 13 back in the early 1980s" (09/28/2016)  . History of blood transfusion 09/28/2016  . Hypertension    no RAS  . Hypothyroidism   . Kidney mass 2008   radiofrequency [facility]  . Left ventricular dysfunction    myoview 2006 normal / myoview 2009 normal / EF 60% cath 09/2009, EF 60% cath 06/2010  . Memory impairment    memory decreasing 06/2010  . Myocardial infarction ([place]) <2016X 1; 2016   "@ [facility]; @ [facility]"  . Nosebleed  "frequently since ~ 1966"   significant, can not take ASA  . Pneumonia 08/2016-09/2016  . Rash Sept 2011   rash on buttocks, hospitalized hydralazine stopped but then restarted w/o return of rash  . Renal cancer ([place])   . Skin cancer of scalp   . Systolic click    no mitral valve prolapse  . Type II diabetes mellitus [facility])     Patient Active Problem List   Diagnosis Date Noted  . GERD (gastroesophageal reflux disease) 07/24/2019  . Hoarseness 07/24/2019  . Goals of care, counseling/discussion   . Acute blood loss anemia 09/28/2016  . CAD (coronary artery disease)   . Pancytopenia ([place])   . Epistaxis, recurrent   . Hyperuricemia   . Acute myeloid leukemia in adult [facility])   . AKI (acute kidney injury) ([place])   . Troponin level elevated   . Leukocytosis   . Lobar pneumonia, unspecified organism ([place])   . Acute on chronic kidney failure ([place]) 09/04/2016  . Acute on chronic systolic CHF (congestive heart failure) ([place]) 09/04/2016  . Thrombocytopenia ([place]) 09/04/2016  . Cellulitis 09/04/2016  . Chronic combined systolic and diastolic CHF (congestive heart failure) ([place]) 09/04/2016  . Paroxysmal atrial fibrillation ([facility])   . Cricopharyngeal dysphagia 11/06/2015  . Esophageal dysphagia 11/06/2015  . Carotid disease, bilateral ([place]) 12/05/2014  . TIA (transient ischemic  attack) 12/04/2014  . MGUS (monoclonal gammopathy of unknown significance) 03/29/2011  . Renal cancer ([place]) 03/29/2011    Class: Diagnosis of  . Shortness of breath 12/24/2010  . EDEMA 11/17/2010  . HEADACHE 10/28/2009  . Coronary artery disease due to lipid rich plaque 09/25/2009  . NOSEBLEED 09/22/2009  . Hypothyroidism, acquired 06/29/2009  . DYSLIPIDEMIA 06/29/2009  . Essential hypertension 06/29/2009  . CHEST PAIN 06/29/2009    Past Surgical History:  Procedure Laterality Date  . BACK SURGERY    . BONE MARROW ASPIRATION     X2  . BONE MARROW BIOPSY     X2  . CARDIAC CATHETERIZATION    . CATARACT  EXTRACTION W/ INTRAOCULAR LENS  IMPLANT, BILATERAL Bilateral   . COLONOSCOPY N/A 03/07/2013   Procedure: COLONOSCOPY;  Surgeon: [name], MD;  Location: [facility];  Service: Endoscopy;  Laterality: N/A;  830-moved to 42 Ann to notify pt  . COLONOSCOPY  01/2013  . LUMBAR DISC SURGERY     "L4-5"  . LYMPH NODE DISSECTION Right     abdomen  . LYMPH NODE DISSECTION Left    robotic laser @ [facility]  on left side of stomach  . MOHS SURGERY     scalp; "22 stitches around; 9 stitches down; went all the way to the skull"  . NASAL SEPTUM SURGERY    . RFA RIGHT KIDNEY    . TONSILLECTOMY  1933       Family History  Problem Relation Age of Onset  . Cancer Maternal Grandfather   . Coronary artery disease Other     Social History   Tobacco Use  . Smoking status: Former Smoker    Packs/day: 0.50    Years: 42.00    Pack years: 21.00    Types: Cigarettes    Quit date: 10/11/1980    Years since quitting: 39.2  . Smokeless tobacco: Never Used  Substance Use Topics  . Alcohol use: Yes    Alcohol/week: 0.0 standard drinks    Comment: 09/28/2016 "don't drink at all anymore; nothing in years; used to have a social drink"  . Drug use: No    Home Medications Prior to Admission medications   Medication Sig Start Date End Date Taking? Authorizing Provider  acetaminophen (TYLENOL) 500 MG tablet Take 500 mg by mouth every 6 (six) hours as needed for moderate pain.   Yes [provider]  albuterol-ipratropium (COMBIVENT) 18-103 MCG/ACT inhaler Inhale 2-4 puffs into the lungs 2 (two) times daily as needed for wheezing.    Yes [provider]  allopurinol (ZYLOPRIM) 100 MG tablet Take 1 tablet (100 mg total) by mouth 2 (two) times daily. Patient taking differently: Take 100 mg by mouth daily.  09/13/16  Yes Annita Brod, MD  amLODipine (NORVASC) 2.5 MG tablet Take 1 tablet by mouth daily. 12/13/19  Yes [provider]  carvedilol (COREG) 3.125 MG tablet Take 1  tablet by mouth daily.   Yes [provider]  Cholecalciferol (VITAMIN D3) 5000 UNITS CAPS Take 5,000 Units by mouth daily.    Yes [provider]  cyanocobalamin 1000 MCG tablet Take 1 tablet by mouth daily. 03/17/11  Yes [provider]  famotidine (PEPCID) 20 MG tablet Take 1 tablet (20 mg total) by mouth at bedtime. 07/31/19  Yes Rehman, Mechele Dawley, MD  furosemide (LASIX) 40 MG tablet Take 20 mg by mouth daily.    Yes [provider]  hydroxyurea (HYDREA) 500 MG capsule Take 500  mg by mouth daily. At 4:30 pm 03/23/19  Yes [provider]  levothyroxine (SYNTHROID, LEVOTHROID) 125 MCG tablet Take 125 mcg by mouth daily.    Yes [provider]  montelukast (SINGULAIR) 10 MG tablet Take 10 mg by mouth at bedtime.    Yes [provider]  nitroGLYCERIN (NITRODUR - DOSED IN MG/24 HR) 0.6 mg/hr patch Place 0.6 mg onto the skin daily.   Yes [provider]  nitroGLYCERIN (NITROSTAT) 0.4 MG SL tablet Place 0.4 mg under the tongue every 5 (five) minutes as needed for chest pain.   Yes [provider]  Omega-3 Fatty Acids (FISH OIL) 1000 MG CPDR Take 1,000 mg by mouth daily.   Yes [provider]  pantoprazole (PROTONIX) 40 MG tablet Take 1 tablet (40 mg total) by mouth daily before breakfast. 07/26/17  Yes Rehman, Mechele Dawley, MD  potassium chloride (KLOR-CON) 10 MEQ tablet Take 10 mEq by mouth daily. 10/22/19  Yes [provider]  tamsulosin (FLOMAX) 0.4 MG CAPS capsule Take 0.4 mg by mouth daily.   Yes [provider]  tiotropium (SPIRIVA) 18 MCG inhalation capsule Place 18 mcg into inhaler and inhale daily.   Yes [provider]  pravastatin (PRAVACHOL) 20 MG tablet Take 20 mg by mouth every evening.    [provider]  predniSONE (DELTASONE) 20 MG tablet 2 tabs po daily x 3 days 01/10/20   Milton Ferguson, MD    Allergies    Ranolazine, Simvastatin, Aspirin, Contrast media [iodinated  diagnostic agents], Ibuprofen, and Ioxaglate  Review of Systems   Review of Systems  Constitutional: Negative for appetite change and fatigue.  HENT: Negative for congestion, ear discharge and sinus pressure.   Eyes: Negative for discharge.  Respiratory: Positive for shortness of breath. Negative for cough.   Cardiovascular: Negative for chest pain.  Gastrointestinal: Negative for abdominal pain and diarrhea.  Genitourinary: Negative for frequency and hematuria.  Musculoskeletal: Negative for back pain.  Skin: Negative for rash.  Neurological: Negative for seizures and headaches.  Psychiatric/Behavioral: Negative for hallucinations.    Physical Exam Updated Vital Signs BP (!) 171/81   Pulse 67   Temp 98.1 F (36.7 C)   Resp 15   Ht 6' 2"  (1.88 m)   Wt 81.7 kg   SpO2 94%   BMI 23.12 kg/m   Physical Exam Vitals and nursing note reviewed.  Constitutional:      Appearance: He is well-developed.  HENT:     Head: Normocephalic.     Nose: Nose normal.  Eyes:     General: No scleral icterus.    Conjunctiva/sclera: Conjunctivae normal.  Neck:     Thyroid: No thyromegaly.  Cardiovascular:     Rate and Rhythm: Normal rate and regular rhythm.     Heart sounds: No murmur. No friction rub. No gallop.   Pulmonary:     Breath sounds: No stridor. No wheezing or rales.  Chest:     Chest wall: No tenderness.  Abdominal:     General: There is no distension.     Tenderness: There is no abdominal tenderness. There is no rebound.  Musculoskeletal:        General: Normal range of motion.     Cervical back: Neck supple.     Comments: Mild edema in leg  Lymphadenopathy:     Cervical: No cervical adenopathy.  Skin:    Findings: No erythema or rash.  Neurological:     Mental Status: He is alert  and oriented to person, place, and time.     Motor: No abnormal muscle tone.     Coordination: Coordination normal.  Psychiatric:        Behavior: Behavior normal.     ED Results /  Procedures / Treatments   Labs (all labs ordered are listed, but only abnormal results are displayed) Labs Reviewed  CBC WITH DIFFERENTIAL/PLATELET - Abnormal; Notable for the following components:      Result Value   WBC 2.8 (*)    RBC 3.65 (*)    Hemoglobin 12.0 (*)    HCT 37.6 (*)    MCV 103.0 (*)    RDW 16.7 (*)    Platelets 94 (*)    Neutro Abs 0.8 (*)    All other components within normal limits  COMPREHENSIVE METABOLIC PANEL - Abnormal; Notable for the following components:   Glucose, Bld 101 (*)    Creatinine, Ser 1.33 (*)    Albumin 3.2 (*)    Total Bilirubin 1.7 (*)    GFR calc non Af Amer 46 (*)    GFR calc Af Amer 53 (*)    All other components within normal limits  BRAIN NATRIURETIC PEPTIDE - Abnormal; Notable for the following components:   B Natriuretic Peptide 816.0 (*)    All other components within normal limits  TROPONIN I (HIGH SENSITIVITY) - Abnormal; Notable for the following components:   Troponin I (High Sensitivity) 18 (*)    All other components within normal limits  TROPONIN I (HIGH SENSITIVITY)    EKG EKG Interpretation  Date/Time:  Thursday January 10 2020 15:39:50 EDT Ventricular Rate:  79 PR Interval:    QRS Duration: 107 QT Interval:  372 QTC Calculation: 427 R Axis:   56 Text Interpretation: Atrial fibrillation Incomplete left bundle branch block Consider anterior infarct Confirmed by Milton Ferguson [phone]) on 01/10/2020 8:41:46 PM   Radiology CT Chest Wo Contrast  Result Date: 01/10/2020 CLINICAL DATA:  Difficulty breathing EXAM: CT CHEST WITHOUT CONTRAST TECHNIQUE: Multidetector CT imaging of the chest was performed following the standard protocol without IV contrast. COMPARISON:  Chest x-ray from earlier in the same day. FINDINGS: Cardiovascular: Somewhat limited due to lack of IV contrast. Aortic calcifications are seen without aneurysmal dilatation. The heart is mildly enlarged in size. Minimal pericardial effusion is noted. Heavy  coronary calcifications are seen. The pulmonary artery is not significantly enlarged. Mediastinum/Nodes: Thoracic inlet is within normal limits. Scattered small hilar and mediastinal lymph nodes are seen. These are not significant by size criteria and likely reactive in nature. The esophagus is within normal limits. Lungs/Pleura: Bilateral pleural effusions are noted with lower lobe consolidation. Chronic areas of scarring are seen. Some inspissated material is noted in the lower lobes of increased density likely related to aspiration. Chronic patchy infiltrates are seen consistent with the patient's given clinical history of COVID-19 positivity. There is a somewhat spiculated lesion identified in the right upper lobe anteriorly. This was not visualized on prior exams dating back to 2014. This is suspicious for underlying neoplasm. Upper Abdomen: Visualized upper abdomen shows no acute abnormality. Musculoskeletal: Degenerative changes of the thoracic spine are noted. No acute compression deformity is seen. IMPRESSION: Bilateral pleural effusions and patchy ground-glass opacities superimposed over more chronic fibrotic changes. The ground-glass changes are consistent with the given clinical history of COVID-19 positivity. Emphysematous changes are noted. Nodular lesion with spiculation measuring 13 mm in the anterior aspect of the right upper lobe best seen on image number 45  of series 4. This is suspicious for underlying neoplasm. Further evaluation can be determined on a clinical basis given the patient's advanced age. Aortic Atherosclerosis (ICD10-I70.0) and Emphysema (ICD10-J43.9). Electronically Signed   By: Inez Catalina M.D.   On: 01/10/2020 19:40   DG Chest Port 1 View  Result Date: 01/10/2020 CLINICAL DATA:  Shortness of breath. EXAM: PORTABLE CHEST 1 VIEW COMPARISON:  March 25, 2017 FINDINGS: The heart size is mildly enlarged. Aortic calcifications are noted. There are small to moderate-sized bilateral  pleural effusions. There are scattered airspace opacities bilaterally which appear to be new since the prior study. Prominent interstitial lung markings are noted. There is no pneumothorax. IMPRESSION: 1. Cardiomegaly with small to moderate-sized bilateral pleural effusions and findings suggestive of pulmonary edema. 2. Scattered bilateral airspace opacities may be secondary to an infectious or inflammatory process. Follow-up to radiologic resolution is recommended. 3. Bibasilar atelectasis with an infiltrate not entirely excluded. Electronically Signed   By: Constance Holster M.D.   On: 01/10/2020 16:18    Procedures Procedures (including critical care time)  Medications Ordered in ED Medications  furosemide (LASIX) tablet 40 mg (40 mg Oral Given 01/10/20 1553)  furosemide (LASIX) injection 40 mg (40 mg Intravenous Given 01/10/20 1741)  predniSONE (DELTASONE) tablet 60 mg (60 mg Oral Given 01/10/20 2100)    ED Course  I have reviewed the triage vital signs and the nursing notes.  Pertinent labs & imaging results that were available during my care of the patient were reviewed by me and considered in my medical decision making (see chart for details).    MDM Rules/Calculators/A&P                      Patient with mild worsening of his congestive heart failure.  And COPD exacerbation.  Patient improved with IV Lasix and albuterol.  He will be discharged home on prednisone and will follow up with his PCP Final Clinical Impression(s) / ED Diagnoses Final diagnoses:  COPD exacerbation ([place])  Systolic congestive heart failure, unspecified HF chronicity ([place])    Rx / DC Orders ED Discharge Orders         Ordered    predniSONE (DELTASONE) 20 MG tablet     01/10/20 2042           Milton Ferguson, MD 01/11/20 1052

## 2020-04-04 DIAGNOSIS — C9200 Acute myeloblastic leukemia, not having achieved remission: Secondary | ICD-10-CM | POA: Diagnosis not present

## 2020-04-04 DIAGNOSIS — M545 Low back pain: Secondary | ICD-10-CM | POA: Diagnosis not present

## 2020-04-04 DIAGNOSIS — Z87891 Personal history of nicotine dependence: Secondary | ICD-10-CM | POA: Diagnosis not present

## 2020-04-04 DIAGNOSIS — D696 Thrombocytopenia, unspecified: Secondary | ICD-10-CM | POA: Diagnosis not present

## 2020-04-04 DIAGNOSIS — I110 Hypertensive heart disease with heart failure: Secondary | ICD-10-CM | POA: Diagnosis not present

## 2020-04-04 DIAGNOSIS — D472 Monoclonal gammopathy: Secondary | ICD-10-CM | POA: Diagnosis not present

## 2020-04-04 DIAGNOSIS — R0602 Shortness of breath: Secondary | ICD-10-CM | POA: Diagnosis not present

## 2020-04-04 DIAGNOSIS — D3A00 Benign carcinoid tumor of unspecified site: Secondary | ICD-10-CM | POA: Diagnosis not present

## 2020-04-04 DIAGNOSIS — R918 Other nonspecific abnormal finding of lung field: Secondary | ICD-10-CM | POA: Diagnosis not present

## 2020-04-04 DIAGNOSIS — R040 Epistaxis: Secondary | ICD-10-CM | POA: Diagnosis not present

## 2020-04-04 DIAGNOSIS — N289 Disorder of kidney and ureter, unspecified: Secondary | ICD-10-CM | POA: Diagnosis not present

## 2020-04-04 DIAGNOSIS — I5022 Chronic systolic (congestive) heart failure: Secondary | ICD-10-CM | POA: Diagnosis not present

## 2020-04-04 DIAGNOSIS — G8929 Other chronic pain: Secondary | ICD-10-CM | POA: Diagnosis not present

## 2020-04-25 ENCOUNTER — Other Ambulatory Visit: Payer: Self-pay

## 2020-04-25 ENCOUNTER — Ambulatory Visit (HOSPITAL_COMMUNITY)
Admission: RE | Admit: 2020-04-25 | Discharge: 2020-04-25 | Disposition: A | Discharge status: Home / Self Care | Payer: Medicare Other | Source: Ambulatory Visit | Attending: Internal Medicine | Admitting: Internal Medicine

## 2020-04-25 ENCOUNTER — Other Ambulatory Visit (HOSPITAL_COMMUNITY): Payer: Self-pay | Admitting: Internal Medicine

## 2020-04-25 DIAGNOSIS — M549 Dorsalgia, unspecified: Secondary | ICD-10-CM

## 2020-07-22 ENCOUNTER — Ambulatory Visit (INDEPENDENT_AMBULATORY_CARE_PROVIDER_SITE_OTHER): Payer: Medicare Other | Admitting: Internal Medicine

## 2020-07-22 ENCOUNTER — Other Ambulatory Visit: Payer: Self-pay

## 2020-07-22 ENCOUNTER — Encounter (INDEPENDENT_AMBULATORY_CARE_PROVIDER_SITE_OTHER): Payer: Self-pay | Admitting: Internal Medicine

## 2020-07-22 VITALS — BP 128/67 | HR 73 | Temp 98.7°F | Ht 74.0 in | Wt 185.0 lb

## 2020-07-22 DIAGNOSIS — R1319 Other dysphagia: Secondary | ICD-10-CM

## 2020-07-22 DIAGNOSIS — K219 Gastro-esophageal reflux disease without esophagitis: Secondary | ICD-10-CM | POA: Diagnosis not present

## 2020-07-22 MED ORDER — FAMOTIDINE 20 MG PO TABS: 20.0000 mg | ORAL_TABLET | Freq: Every day | ORAL | 3 refills | Status: AC

## 2020-07-22 MED ORDER — PANTOPRAZOLE SODIUM 40 MG PO TBEC: 40.0000 mg | DELAYED_RELEASE_TABLET | Freq: Every day | ORAL | 3 refills | Status: AC

## 2020-07-22 NOTE — Patient Instructions (Addendum)
Please call office with progress report in 2 to 3 weeks.  

## 2020-07-22 NOTE — Progress Notes (Signed)
Presenting complaint;  Swallowing difficulty.  Database and subjective:  Patient is 86 year old Caucasian male who is here for scheduled visit accompanied by his son Brian Mcmillan.  He has chronic GERD and history of dysphagia felt to be due to esophageal motility disorder.  He has not had EGD in several years.  He did have barium study in January 2017 which revealed diffuse esophageal dysmotility with incomplete clearance of barium by primary peristalsis.  He also had laryngeal penetration without aspiration and residual in the vallecula and piriform sinuses.  He has been managing his symptoms by watching his diet and chewing of his food thoroughly.  He has been having problems with dysphagia since he had Covid infection about 8 months ago.  Since then he also has been having hoarseness.  He has difficulty with liquids as well as solids.  He has most difficulty when he tries to eat hamburgers.  His son states that when he had Covid he lost 16 pounds and he has gained some of it back.  He did receive antibody infusion when he was diagnosed with Covid.  He does not frequent heartburn.  His appetite is fair. He fell 3 weeks ago when he lost balance trying to put on pants.  He sustained fracture to right ankle and leg.  He is now wheelchair-bound.   He lost his wife in in February this year.  Current Medications: Outpatient Encounter Medications as of 07/22/2020  Medication Sig  . acetaminophen (TYLENOL) 500 MG tablet Take 500 mg by mouth every 6 (six) hours as needed for moderate pain.  Marland Kitchen albuterol-ipratropium (COMBIVENT) 18-103 MCG/ACT inhaler Inhale 2-4 puffs into the lungs 2 (two) times daily as needed for wheezing.   Marland Kitchen allopurinol (ZYLOPRIM) 100 MG tablet Take 1 tablet (100 mg total) by mouth 2 (two) times daily. (Patient taking differently: Take 100 mg by mouth daily. )  . amLODipine (NORVASC) 2.5 MG tablet Take 1 tablet by mouth daily.  . carvedilol (COREG) 3.125 MG tablet Take 1 tablet by mouth 2  (two) times daily with a meal.   . Cholecalciferol (VITAMIN D3) 5000 UNITS CAPS Take 5,000 Units by mouth daily.   . cyanocobalamin 1000 MCG tablet Take 2 tablets by mouth daily.   . furosemide (LASIX) 40 MG tablet Take 20 mg by mouth daily.   . hydroxyurea (HYDREA) 500 MG capsule Take 500 mg by mouth daily. At 4:30 pm  . levothyroxine (SYNTHROID, LEVOTHROID) 125 MCG tablet Take 125 mcg by mouth daily.   . montelukast (SINGULAIR) 10 MG tablet Take 10 mg by mouth at bedtime.   . nitroGLYCERIN (NITRODUR - DOSED IN MG/24 HR) 0.6 mg/hr patch Place 0.6 mg onto the skin daily.  . nitroGLYCERIN (NITROSTAT) 0.4 MG SL tablet Place 0.4 mg under the tongue every 5 (five) minutes as needed for chest pain.  . Omega-3 Fatty Acids (FISH OIL) 1000 MG CPDR Take 1,200 mg by mouth daily.   . potassium chloride (KLOR-CON) 10 MEQ tablet Take 10 mEq by mouth daily.  . pravastatin (PRAVACHOL) 20 MG tablet Take 20 mg by mouth every evening.  . tamsulosin (FLOMAX) 0.4 MG CAPS capsule Take 0.4 mg by mouth daily.  . famotidine (PEPCID) 20 MG tablet Take 1 tablet (20 mg total) by mouth at bedtime. (Patient not taking: Reported on 07/22/2020)  . pantoprazole (PROTONIX) 40 MG tablet Take 1 tablet (40 mg total) by mouth daily before breakfast. (Patient not taking: Reported on 07/22/2020)  . tiotropium (SPIRIVA) 18 MCG inhalation capsule Place  18 mcg into inhaler and inhale daily. (Patient not taking: Reported on 07/22/2020)  . [DISCONTINUED] predniSONE (DELTASONE) 20 MG tablet 2 tabs po daily x 3 days   No facility-administered encounter medications on file as of 07/22/2020.     Objective: Blood pressure 128/67, pulse 73, temperature 98.7 F (37.1 C), temperature source Oral, height 6\' 2"  (1.88 m), weight 185 lb (83.9 kg). Patient appears to be comfortable in a wheelchair. He is wearing a mask. Conjunctiva is pink. Sclera is nonicteric Oropharyngeal mucosa is normal. No neck masses or thyromegaly noted. Cardiac exam  with regular rhythm normal S1 and S2. No murmur or gallop noted. Lungs are clear to auscultation. Abdomen is full.  He has long midline scar.  He has umbilical hernia which is soft and reducible.  No tenderness organomegaly or masses. No LE edema or clubbing noted. He has a right ankle brace.   Assessment:  #1.  Esophageal dysphagia.  He has a history of esophageal motility disorder but he has been managing by watching his diet and with food selection.  Now he appears to be having progressive difficulty.  Likely is not had food impaction.  I wonder if his symptom progression is due to the fact that he is not on therapy for GERD.  #2.  GERD with atypical symptoms.  Plan:  Patient advised to go back on pantoprazole at a dose of 40 mg p.o. every morning. He will take famotidine 20 mg by mouth daily at bedtime. Patient or his son Brian Mcmillan will call with progress report in few weeks.  If there is no improvement in dysphagia we will proceed with esophagogastroduodenoscopy and possible esophageal dilation. Office visit in 6 months.

## 2020-08-01 ENCOUNTER — Emergency Department (HOSPITAL_COMMUNITY): Payer: Medicare Other

## 2020-08-01 ENCOUNTER — Emergency Department (HOSPITAL_COMMUNITY)
Admission: EM | Admit: 2020-08-01 | Discharge: 2020-08-02 | Disposition: A | Discharge status: Home / Self Care | Payer: Medicare Other | Attending: Emergency Medicine | Admitting: Emergency Medicine

## 2020-08-01 ENCOUNTER — Other Ambulatory Visit: Payer: Self-pay

## 2020-08-01 ENCOUNTER — Encounter (HOSPITAL_COMMUNITY): Payer: Self-pay | Admitting: *Deleted

## 2020-08-01 DIAGNOSIS — N189 Chronic kidney disease, unspecified: Secondary | ICD-10-CM | POA: Diagnosis not present

## 2020-08-01 DIAGNOSIS — R410 Disorientation, unspecified: Secondary | ICD-10-CM

## 2020-08-01 DIAGNOSIS — Z85820 Personal history of malignant melanoma of skin: Secondary | ICD-10-CM | POA: Diagnosis not present

## 2020-08-01 DIAGNOSIS — I5042 Chronic combined systolic (congestive) and diastolic (congestive) heart failure: Secondary | ICD-10-CM | POA: Insufficient documentation

## 2020-08-01 DIAGNOSIS — I2510 Atherosclerotic heart disease of native coronary artery without angina pectoris: Secondary | ICD-10-CM | POA: Diagnosis not present

## 2020-08-01 DIAGNOSIS — Z87891 Personal history of nicotine dependence: Secondary | ICD-10-CM | POA: Insufficient documentation

## 2020-08-01 DIAGNOSIS — I130 Hypertensive heart and chronic kidney disease with heart failure and stage 1 through stage 4 chronic kidney disease, or unspecified chronic kidney disease: Secondary | ICD-10-CM | POA: Insufficient documentation

## 2020-08-01 DIAGNOSIS — Z85528 Personal history of other malignant neoplasm of kidney: Secondary | ICD-10-CM | POA: Diagnosis not present

## 2020-08-01 DIAGNOSIS — J449 Chronic obstructive pulmonary disease, unspecified: Secondary | ICD-10-CM | POA: Insufficient documentation

## 2020-08-01 DIAGNOSIS — R531 Weakness: Secondary | ICD-10-CM | POA: Diagnosis not present

## 2020-08-01 DIAGNOSIS — E119 Type 2 diabetes mellitus without complications: Secondary | ICD-10-CM | POA: Insufficient documentation

## 2020-08-01 DIAGNOSIS — R1030 Lower abdominal pain, unspecified: Secondary | ICD-10-CM | POA: Insufficient documentation

## 2020-08-01 DIAGNOSIS — Z79899 Other long term (current) drug therapy: Secondary | ICD-10-CM | POA: Diagnosis not present

## 2020-08-01 DIAGNOSIS — E039 Hypothyroidism, unspecified: Secondary | ICD-10-CM | POA: Insufficient documentation

## 2020-08-01 LAB — BASIC METABOLIC PANEL
Anion gap: 10 (ref 5–15)
BUN: 14 mg/dL (ref 8–23)
CO2: 29 mmol/L (ref 22–32)
Calcium: 9.4 mg/dL (ref 8.9–10.3)
Chloride: 101 mmol/L (ref 98–111)
Creatinine, Ser: 1.44 mg/dL — ABNORMAL HIGH (ref 0.61–1.24)
GFR, Estimated: 45 mL/min — ABNORMAL LOW (ref >60)
Glucose, Bld: 127 mg/dL — ABNORMAL HIGH (ref 70–99)
Potassium: 3.3 mmol/L — ABNORMAL LOW (ref 3.5–5.1)
Sodium: 140 mmol/L (ref 135–145)

## 2020-08-01 LAB — CBC WITH DIFFERENTIAL/PLATELET
Abs Immature Granulocytes: 0.04 10*3/uL (ref 0.00–0.07)
Basophils Absolute: 0 10*3/uL (ref 0.0–0.1)
Basophils Relative: 0 %
Eosinophils Absolute: 0 10*3/uL (ref 0.0–0.5)
Eosinophils Relative: 1 %
HCT: 43.9 % (ref 39.0–52.0)
Hemoglobin: 14.2 g/dL (ref 13.0–17.0)
Immature Granulocytes: 1 %
Lymphocytes Relative: 38 %
Lymphs Abs: 1.6 10*3/uL (ref 0.7–4.0)
MCH: 33.9 pg (ref 26.0–34.0)
MCHC: 32.3 g/dL (ref 30.0–36.0)
MCV: 104.8 fL — ABNORMAL HIGH (ref 80.0–100.0)
Monocytes Absolute: 0.8 10*3/uL (ref 0.1–1.0)
Monocytes Relative: 19 %
Neutro Abs: 1.7 10*3/uL (ref 1.7–7.7)
Neutrophils Relative %: 41 %
Platelets: 72 10*3/uL — ABNORMAL LOW (ref 150–400)
RBC: 4.19 MIL/uL — ABNORMAL LOW (ref 4.22–5.81)
RDW: 15 % (ref 11.5–15.5)
WBC: 4.2 10*3/uL (ref 4.0–10.5)
nRBC: 0 % (ref 0.0–0.2)

## 2020-08-01 LAB — URINALYSIS, ROUTINE W REFLEX MICROSCOPIC
Bacteria, UA: NONE SEEN
Bilirubin Urine: NEGATIVE
Glucose, UA: NEGATIVE mg/dL
Ketones, ur: NEGATIVE mg/dL
Leukocytes,Ua: NEGATIVE
Nitrite: NEGATIVE
Protein, ur: 30 mg/dL — AB
Specific Gravity, Urine: 1.008 (ref 1.005–1.030)
pH: 7 (ref 5.0–8.0)

## 2020-08-01 MED ORDER — HYDRALAZINE HCL 20 MG/ML IJ SOLN
10.0000 mg | INTRAMUSCULAR | Status: AC
  Administered 2020-08-01: 10 mg via INTRAVENOUS
  Filled 2020-08-01: qty 1

## 2020-08-01 MED ORDER — POTASSIUM CHLORIDE 10 MEQ/100ML IV SOLN: 10.0000 meq | Freq: Once | INTRAVENOUS | Status: DC

## 2020-08-01 MED ORDER — POTASSIUM CHLORIDE CRYS ER 20 MEQ PO TBCR
40.0000 meq | EXTENDED_RELEASE_TABLET | Freq: Once | ORAL | Status: AC
  Administered 2020-08-01: 40 meq via ORAL
  Filled 2020-08-01: qty 2

## 2020-08-01 MED ORDER — AMLODIPINE BESYLATE 5 MG PO TABS
2.5000 mg | ORAL_TABLET | Freq: Every day | ORAL | Status: DC
  Administered 2020-08-01: 2.5 mg via ORAL
  Filled 2020-08-01: qty 1

## 2020-08-01 MED ORDER — CARVEDILOL 3.125 MG PO TABS: 3.1250 mg | ORAL_TABLET | Freq: Two times a day (BID) | ORAL | Status: DC

## 2020-08-01 NOTE — ED Triage Notes (Signed)
[facility] reports that pt has been weaker and confusion for past 2 days.  incont of urine as well.  Pt with brace to right lower leg  Pt denies pain at present, admits to burning with urination.

## 2020-08-01 NOTE — ED Provider Notes (Signed)
[name] Provider Note   CSN: [id number] Arrival date & time: 08/01/20  1757     History Chief Complaint  Patient presents with  . Weakness    Brian Mcmillan is a 86 y.o. male.  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  He presents to the hospital today by EMS transport after having a couple of days of progressive weakness.  The patient reports that he has recently fallen and broken his leg, he has nonsurgical injuries to his right lower extremity, I do not have the x-rays to confirm what these injuries are but the patient reports they are nonoperative and he has been ambulatory on his walker.  He notes that starting yesterday he started to have some urinary incontinence and today started to have a burning with urination feeling like he had burning peas coming out of his Penis.  Denies fevers chills nausea or vomiting, he has not had any medications for this, symptoms are intermittent but becoming more frequent.  He does not have any focal weakness.  The patient does live by himself, he has both family members and caregivers that are coming into the house rotating throughout the day but it is not 24/7.  Past Medical History:  Diagnosis Date  . Abdominal mass 2008   chronic inflammatory mass of the mesentary Dr Neijstrom  . Acute myeloblastic leukemia (HCC) dx'd 09/2016  . Anemia   . Anginal pain (HCC)   . CAD (coronary artery disease)    cath 09/23/09 70% Dx1 (diffuse disease), 40% Cx, Large RCA 80-90% with heavy calcification / cath 06/2010 no change tiht RCA still best to treat medially with nosebleeds  . Chest pain    myoview 2006 no ischemia/ myoview 2009 no ischemia  . CHF (congestive heart failure) (HCC)   . CKD (chronic kidney disease)   . COPD (chronic obstructive pulmonary disease)  (HCC)   . COVID-19   . Dyslipidemia   . GERD (gastroesophageal reflux disease)   . Gout   . History of bleeding ulcers    "had 13 back in the early 1980s" (09/28/2016)  . History of blood transfusion 09/28/2016  . Hypertension    no RAS  . Hypothyroidism   . Kidney mass 2008   radiofrequency ablation at [facility]  . Left ventricular dysfunction    myoview 2006 normal / myoview 2009 normal / EF 60% cath 09/2009, EF 60% cath 06/2010  . Memory impairment    memory decreasing 06/2010  . Myocardial infarction (HCC) <2016X 1; 2016   "@ [facility]; @ [facility]"  . Nosebleed "frequently since ~ 1966"   significant, can not take ASA  . Pneumonia 08/2016-09/2016  . Rash Sept 2011   rash on buttocks, hospitalized hydralazine stopped but then restarted w/o return of rash  . Renal cancer (HCC)   . Skin cancer of scalp   . Systolic click    no mitral valve prolapse  . Type II diabetes mellitus (HCC)     Patient Active Problem List   Diagnosis Date Noted  . GERD (gastroesophageal reflux disease) 07/24/2019  . Hoarseness 07/24/2019  . Goals of care, counseling/discussion   . Acute blood loss anemia 09/28/2016  . CAD (coronary artery disease)   . Pancytopenia (HCC)   . Epistaxis, recurrent   . Hyperuricemia   .   Acute myeloid leukemia in adult (HCC)   . AKI (acute kidney injury) (HCC)   . Troponin level elevated   . Leukocytosis   . Lobar pneumonia, unspecified organism (HCC)   . Acute on chronic kidney failure (HCC) 09/04/2016  . Acute on chronic systolic CHF (congestive heart failure) (HCC) 09/04/2016  . Thrombocytopenia (HCC) 09/04/2016  . Cellulitis 09/04/2016  . Chronic combined systolic and diastolic CHF (congestive heart failure) (HCC) 09/04/2016  . Paroxysmal atrial fibrillation (HCC)   . Cricopharyngeal dysphagia 11/06/2015  . Esophageal dysphagia 11/06/2015  . Carotid disease, bilateral (HCC) 12/05/2014  . TIA (transient ischemic attack) 12/04/2014  . MGUS (monoclonal gammopathy of  unknown significance) 03/29/2011  . Renal cancer (HCC) 03/29/2011    Class: Diagnosis of  . Shortness of breath 12/24/2010  . EDEMA 11/17/2010  . HEADACHE 10/28/2009  . Coronary artery disease due to lipid rich plaque 09/25/2009  . NOSEBLEED 09/22/2009  . Hypothyroidism, acquired 06/29/2009  . DYSLIPIDEMIA 06/29/2009  . Essential hypertension 06/29/2009  . CHEST PAIN 06/29/2009    Past Surgical History:  Procedure Laterality Date  . BACK SURGERY    . BONE MARROW ASPIRATION     X2  . BONE MARROW BIOPSY     X2  . CARDIAC CATHETERIZATION    . CATARACT EXTRACTION W/ INTRAOCULAR LENS  IMPLANT, BILATERAL Bilateral   . COLONOSCOPY N/A 03/07/2013   Procedure: COLONOSCOPY;  Surgeon: [name], MD;  Location: [facility];  Service: Endoscopy;  Laterality: N/A;  830-moved to 925 Ann to notify pt  . COLONOSCOPY  01/2013  . LUMBAR DISC SURGERY     "L4-5"  . LYMPH NODE DISSECTION Right     abdomen  . LYMPH NODE DISSECTION Left    robotic laser @ [facility]  on left side of stomach  . MOHS SURGERY     scalp; "22 stitches around; 9 stitches down; went all the way to the skull"  . NASAL SEPTUM SURGERY    . RFA RIGHT KIDNEY    . TONSILLECTOMY  1933       Family History  Problem Relation Age of Onset  . Cancer Maternal Grandfather   . Coronary artery disease Other     Social History   Tobacco Use  . Smoking status: Former Smoker    Packs/day: 0.50    Years: 42.00    Pack years: 21.00    Types: Cigarettes    Quit date: 10/11/1980    Years since quitting: 39.8  . Smokeless tobacco: Never Used  Substance Use Topics  . Alcohol use: Yes    Alcohol/week: 0.0 standard drinks    Comment: 09/28/2016 "don't drink at all anymore; nothing in years; used to have a social drink"  . Drug use: No    Home Medications Prior to Admission medications   Medication Sig Start Date End Date Taking? Authorizing Provider  acetaminophen (TYLENOL) 500 MG tablet Take 500 mg by mouth every 6 (six)  hours as needed for moderate pain.    [provider]  albuterol-ipratropium (COMBIVENT) 18-103 MCG/ACT inhaler Inhale 2-4 puffs into the lungs 2 (two) times daily as needed for wheezing.     [provider]  allopurinol (ZYLOPRIM) 100 MG tablet Take 1 tablet (100 mg total) by mouth 2 (two) times daily. Patient taking differently: Take 100 mg by mouth daily.  09/13/16   Krishnan, Sendil K, MD  amLODipine (NORVASC) 2.5 MG tablet Take 1 tablet by mouth daily. 12/13/19   [provider]    carvedilol (COREG) 3.125 MG tablet Take 1 tablet by mouth 2 (two) times daily with a meal.     [provider]  Cholecalciferol (VITAMIN D3) 5000 UNITS CAPS Take 5,000 Units by mouth daily.     [provider]  cyanocobalamin 1000 MCG tablet Take 2 tablets by mouth daily.  03/17/11   [provider]  famotidine (PEPCID) 20 MG tablet Take 1 tablet (20 mg total) by mouth at bedtime. 07/22/20   Rehman, Mechele Dawley, MD  furosemide (LASIX) 40 MG tablet Take 20 mg by mouth daily.     [provider]  hydroxyurea (HYDREA) 500 MG capsule Take 500 mg by mouth daily. At 4:30 pm 03/23/19   [provider]  levothyroxine (SYNTHROID, LEVOTHROID) 125 MCG tablet Take 125 mcg by mouth daily.     [provider]  montelukast (SINGULAIR) 10 MG tablet Take 10 mg by mouth at bedtime.     [provider]  nitroGLYCERIN (NITRODUR - DOSED IN MG/24 HR) 0.6 mg/hr patch Place 0.6 mg onto the skin daily.    [provider]  nitroGLYCERIN (NITROSTAT) 0.4 MG SL tablet Place 0.4 mg under the tongue every 5 (five) minutes as needed for chest pain.    [provider]  Omega-3 Fatty Acids (FISH OIL) 1000 MG CPDR Take 1,200 mg by mouth daily.     [provider]  pantoprazole (PROTONIX) 40 MG tablet Take 1 tablet (40 mg total) by mouth daily before breakfast. 07/22/20   Rehman, Mechele Dawley, MD  potassium chloride (KLOR-CON) 10 MEQ tablet Take 10 mEq  by mouth daily. 10/22/19   [provider]  pravastatin (PRAVACHOL) 20 MG tablet Take 20 mg by mouth every evening.    [provider]  tamsulosin (FLOMAX) 0.4 MG CAPS capsule Take 0.4 mg by mouth daily.    [provider]  tiotropium (SPIRIVA) 18 MCG inhalation capsule Place 18 mcg into inhaler and inhale daily. Patient not taking: Reported on 07/22/2020    [provider]    Allergies    Ranolazine, Simvastatin, Aspirin, Contrast media [iodinated diagnostic agents], Ibuprofen, and Ioxaglate  Review of Systems   Review of Systems  Constitutional: Positive for fatigue. Negative for chills and fever.  Respiratory: Negative for chest tightness and shortness of breath.   Cardiovascular: Negative for chest pain and leg swelling.  Gastrointestinal: Negative for diarrhea and nausea.  Genitourinary: Positive for difficulty urinating and dysuria.  Neurological: Positive for weakness. Negative for numbness and headaches.  All other systems reviewed and are negative.   Physical Exam Updated Vital Signs BP (!) 156/85   Pulse 77   Temp 97.8 F (36.6 C) (Oral)   Resp 20   Ht 1.88 m (6' 2")   Wt 83.9 kg   SpO2 97%   BMI 23.75 kg/m   Physical Exam Vitals and nursing note reviewed.  Constitutional:      General: He is not in acute distress.    Appearance: He is well-developed.  HENT:     Head: Normocephalic and atraumatic.     Mouth/Throat:     Pharynx: No oropharyngeal exudate.  Eyes:     General: No scleral icterus.       Right eye: No discharge.        Left eye: No discharge.     Conjunctiva/sclera: Conjunctivae normal.     Pupils: Pupils are equal, round, and reactive to light.  Neck:     Thyroid: No thyromegaly.  Vascular: No JVD.  Cardiovascular:     Rate and Rhythm: Normal rate and regular rhythm.     Heart sounds: Normal heart sounds. No murmur heard.  No friction rub. No gallop.   Pulmonary:     Effort: Pulmonary effort is  normal. No respiratory distress.     Breath sounds: Normal breath sounds. No wheezing or rales.  Abdominal:     General: Bowel sounds are normal. There is no distension.     Palpations: Abdomen is soft. There is no mass.     Tenderness: There is no abdominal tenderness.     Comments: R lower abdominal non tender mass  Musculoskeletal:        General: No tenderness. Normal range of motion.     Cervical back: Normal range of motion and neck supple.  Lymphadenopathy:     Cervical: No cervical adenopathy.  Skin:    General: Skin is warm and dry.     Findings: No erythema or rash.  Neurological:     Mental Status: He is alert.     Coordination: Coordination normal.     Comments: Able to follow commands - has no weakness focally, speech clear, no facial droop.  Psychiatric:        Behavior: Behavior normal.     ED Results / Procedures / Treatments   Labs (all labs ordered are listed, but only abnormal results are displayed) Labs Reviewed  URINALYSIS, ROUTINE W REFLEX MICROSCOPIC - Abnormal; Notable for the following components:      Result Value   Hgb urine dipstick MODERATE (*)    Protein, ur 30 (*)    All other components within normal limits  CBC WITH DIFFERENTIAL/PLATELET - Abnormal; Notable for the following components:   RBC 4.19 (*)    MCV 104.8 (*)    Platelets 72 (*)    All other components within normal limits  BASIC METABOLIC PANEL - Abnormal; Notable for the following components:   Potassium 3.3 (*)    Glucose, Bld 127 (*)    Creatinine, Ser 1.44 (*)    GFR, Estimated 45 (*)    All other components within normal limits  URINE CULTURE    EKG None  Radiology CT Head Wo Contrast  Result Date: 08/02/2020 CLINICAL DATA:  Increased weakness EXAM: CT HEAD WITHOUT CONTRAST TECHNIQUE: Contiguous axial images were obtained from the base of the skull through the vertex without intravenous contrast. COMPARISON:  December 05, 2014 FINDINGS: Brain: No evidence of acute  territorial infarction, hemorrhage, hydrocephalus,extra-axial collection or mass lesion/mass effect. There is dilatation the ventricles and sulci consistent with age-related atrophy. Low-attenuation changes in the deep white matter consistent with small vessel ischemia. Vascular: No hyperdense vessel or unexpected calcification. Skull: The skull is intact. No fracture or focal lesion identified. Sinuses/Orbits: The visualized paranasal sinuses and mastoid air cells are clear. The orbits and globes intact. Other: None IMPRESSION: No acute intracranial abnormality. Findings consistent with age related atrophy and chronic small vessel ischemia Electronically Signed   By: Prudencio Pair M.D.   On: 08/02/2020 00:08    Procedures Procedures (including critical care time)  Medications Ordered in ED Medications  potassium chloride SA (KLOR-CON) CR tablet 40 mEq (40 mEq Oral Given 08/01/20 2151)  hydrALAZINE (APRESOLINE) injection 10 mg (10 mg Intravenous Given 08/01/20 2252)    ED Course  I have reviewed the triage vital signs and the nursing notes.  Pertinent labs & imaging results that were available during my care of  the patient were reviewed by me and considered in my medical decision making (see chart for details).    MDM Rules/Calculators/A&P                          This patient does have some hypertension at 189/101 but is afebrile and has a normal pulse of 69.  There is no abdominal tenderness, he has dysuria and some incontinence raising suspicion for a urinary tract infection and given that he has no focal neurologic deficits I suspect that is what it is.  We will hold off on CT scan at this time as I do not think this is consistent with a stroke and he has no chest pain shortness of breath or palpitations that would suggest that this is a cardiac cause.  The patient is agreeable to the plan.  He does have evidence of fracture to his right lower extremity with some mild bruising but is wearing  his brace, is not tachycardic or short of breath and I do not think there is a pulmonary embolism.  The patient is hypertensive, he was given a dose of his home medications as he has not had them this evening  The patient has normal lab work for him, this includes an elevated MCV and a low platelet count but this is all consistent with his prior myelodysplasia.  His urinalysis shows a small amount of protein but no signs of infection.  I discussed his case with both he and his son [name], they were concerned that he may have a urine infection given his incontinence and having some occasional confusion.  I have discussed with them that we have not found anything specific other than his blood pressure.  This will need to be closely monitored in the outpatient setting.  The patient himself states that his blood pressure does get elevated when he gets agitated and he did not want to come to the hospital tonight but was forced to come by his family members according to the patient's report.  He appears hemodynamically stable, despite having severe hypertension this will be treated with his home medications and he can follow-up in the outpatient setting as I suspect he has been hypertensive for some time.  A review of the patient's prior blood pressures shows that in April he was 171/80, in October of last year he was 196/96, he has also had some more normal values in between these values.  The patient takes 2.5 mg of amlodipine, I will increase this to 5mg for home  I discussed the case with Steve and [name] his sons - and they are in agreement with the plan.  They state that he had a fall, a week ago - and that's when some of the confusion started - the "confusion" is vague and not well described.    The patient appears well, the rest of his test have been unremarkable, I have discussed the care with his family, we will increase the dose of his blood pressure medications, otherwise stable for  discharge  Final Clinical Impression(s) / ED Diagnoses Final diagnoses:  Weakness  Confusion    Rx / DC Orders ED Discharge Orders    None       Miller, Brian, MD 08/02/20 1140  

## 2020-08-02 ENCOUNTER — Telehealth (HOSPITAL_COMMUNITY): Payer: Self-pay | Admitting: Emergency Medicine

## 2020-08-02 MED ORDER — AMLODIPINE BESYLATE 5 MG PO TABS: 5.0000 mg | ORAL_TABLET | Freq: Every day | ORAL | 1 refills | Status: AC

## 2020-08-02 NOTE — ED Notes (Signed)
Pt given a sandwich and drink.  Pt's Son Remo Lipps) called to come pick up pt

## 2020-08-03 LAB — URINE CULTURE: Culture: NO GROWTH

## 2020-08-04 NOTE — Telephone Encounter (Signed)
Called in Rx for BP meds

## 2020-09-29 ENCOUNTER — Ambulatory Visit (INDEPENDENT_AMBULATORY_CARE_PROVIDER_SITE_OTHER)
Admission: EM | Admit: 2020-09-29 | Discharge: 2020-09-29 | Disposition: A | Discharge status: Home / Self Care | Payer: Medicare Other | Source: Home / Self Care

## 2020-09-29 ENCOUNTER — Other Ambulatory Visit: Payer: Self-pay

## 2020-09-29 ENCOUNTER — Emergency Department (HOSPITAL_COMMUNITY)
Admission: EM | Admit: 2020-09-29 | Discharge: 2020-09-29 | Disposition: A | Discharge status: Home / Self Care | Payer: Medicare Other | Attending: Emergency Medicine | Admitting: Emergency Medicine

## 2020-09-29 ENCOUNTER — Encounter (HOSPITAL_COMMUNITY): Payer: Self-pay

## 2020-09-29 DIAGNOSIS — R52 Pain, unspecified: Secondary | ICD-10-CM | POA: Diagnosis not present

## 2020-09-29 DIAGNOSIS — Z8616 Personal history of COVID-19: Secondary | ICD-10-CM | POA: Diagnosis not present

## 2020-09-29 DIAGNOSIS — R0902 Hypoxemia: Secondary | ICD-10-CM | POA: Diagnosis not present

## 2020-09-29 DIAGNOSIS — N189 Chronic kidney disease, unspecified: Secondary | ICD-10-CM | POA: Diagnosis not present

## 2020-09-29 DIAGNOSIS — I130 Hypertensive heart and chronic kidney disease with heart failure and stage 1 through stage 4 chronic kidney disease, or unspecified chronic kidney disease: Secondary | ICD-10-CM | POA: Diagnosis not present

## 2020-09-29 DIAGNOSIS — I959 Hypotension, unspecified: Secondary | ICD-10-CM | POA: Insufficient documentation

## 2020-09-29 DIAGNOSIS — I5042 Chronic combined systolic (congestive) and diastolic (congestive) heart failure: Secondary | ICD-10-CM | POA: Diagnosis not present

## 2020-09-29 DIAGNOSIS — J449 Chronic obstructive pulmonary disease, unspecified: Secondary | ICD-10-CM | POA: Diagnosis not present

## 2020-09-29 DIAGNOSIS — N3000 Acute cystitis without hematuria: Secondary | ICD-10-CM | POA: Insufficient documentation

## 2020-09-29 DIAGNOSIS — E1122 Type 2 diabetes mellitus with diabetic chronic kidney disease: Secondary | ICD-10-CM | POA: Insufficient documentation

## 2020-09-29 DIAGNOSIS — R300 Dysuria: Secondary | ICD-10-CM | POA: Insufficient documentation

## 2020-09-29 DIAGNOSIS — Z87891 Personal history of nicotine dependence: Secondary | ICD-10-CM | POA: Diagnosis not present

## 2020-09-29 DIAGNOSIS — I2510 Atherosclerotic heart disease of native coronary artery without angina pectoris: Secondary | ICD-10-CM | POA: Diagnosis not present

## 2020-09-29 DIAGNOSIS — I491 Atrial premature depolarization: Secondary | ICD-10-CM | POA: Diagnosis not present

## 2020-09-29 DIAGNOSIS — R5383 Other fatigue: Secondary | ICD-10-CM | POA: Insufficient documentation

## 2020-09-29 DIAGNOSIS — E039 Hypothyroidism, unspecified: Secondary | ICD-10-CM | POA: Diagnosis not present

## 2020-09-29 DIAGNOSIS — Z7984 Long term (current) use of oral hypoglycemic drugs: Secondary | ICD-10-CM | POA: Insufficient documentation

## 2020-09-29 DIAGNOSIS — Z79899 Other long term (current) drug therapy: Secondary | ICD-10-CM | POA: Diagnosis not present

## 2020-09-29 LAB — CBC WITH DIFFERENTIAL/PLATELET
Abs Immature Granulocytes: 0.03 10*3/uL (ref 0.00–0.07)
Basophils Absolute: 0 10*3/uL (ref 0.0–0.1)
Basophils Relative: 1 %
Eosinophils Absolute: 0 10*3/uL (ref 0.0–0.5)
Eosinophils Relative: 0 %
HCT: 42.5 % (ref 39.0–52.0)
Hemoglobin: 13.9 g/dL (ref 13.0–17.0)
Immature Granulocytes: 1 %
Lymphocytes Relative: 40 %
Lymphs Abs: 1.5 10*3/uL (ref 0.7–4.0)
MCH: 33.5 pg (ref 26.0–34.0)
MCHC: 32.7 g/dL (ref 30.0–36.0)
MCV: 102.4 fL — ABNORMAL HIGH (ref 80.0–100.0)
Monocytes Absolute: 0.9 10*3/uL (ref 0.1–1.0)
Monocytes Relative: 23 %
Neutro Abs: 1.3 10*3/uL — ABNORMAL LOW (ref 1.7–7.7)
Neutrophils Relative %: 35 %
Platelets: 93 10*3/uL — ABNORMAL LOW (ref 150–400)
RBC: 4.15 MIL/uL — ABNORMAL LOW (ref 4.22–5.81)
RDW: 14.7 % (ref 11.5–15.5)
WBC: 3.8 10*3/uL — ABNORMAL LOW (ref 4.0–10.5)
nRBC: 0 % (ref 0.0–0.2)

## 2020-09-29 LAB — URINALYSIS, ROUTINE W REFLEX MICROSCOPIC
Bilirubin Urine: NEGATIVE
Glucose, UA: NEGATIVE mg/dL
Ketones, ur: NEGATIVE mg/dL
Nitrite: NEGATIVE
Protein, ur: NEGATIVE mg/dL
Specific Gravity, Urine: 1.008 (ref 1.005–1.030)
WBC, UA: >50 WBC/hpf — ABNORMAL HIGH (ref 0–5)
pH: 6 (ref 5.0–8.0)

## 2020-09-29 LAB — COMPREHENSIVE METABOLIC PANEL
ALT: 18 U/L (ref 0–44)
AST: 37 U/L (ref 15–41)
Albumin: 3.2 g/dL — ABNORMAL LOW (ref 3.5–5.0)
Alkaline Phosphatase: 86 U/L (ref 38–126)
Anion gap: 10 (ref 5–15)
BUN: 33 mg/dL — ABNORMAL HIGH (ref 8–23)
CO2: 27 mmol/L (ref 22–32)
Calcium: 10.1 mg/dL (ref 8.9–10.3)
Chloride: 103 mmol/L (ref 98–111)
Creatinine, Ser: 1.97 mg/dL — ABNORMAL HIGH (ref 0.61–1.24)
GFR, Estimated: 31 mL/min — ABNORMAL LOW (ref >60)
Glucose, Bld: 166 mg/dL — ABNORMAL HIGH (ref 70–99)
Potassium: 4 mmol/L (ref 3.5–5.1)
Sodium: 140 mmol/L (ref 135–145)
Total Bilirubin: 1.4 mg/dL — ABNORMAL HIGH (ref 0.3–1.2)
Total Protein: 8 g/dL (ref 6.5–8.1)

## 2020-09-29 LAB — POCT URINALYSIS DIP (MANUAL ENTRY)
Bilirubin, UA: NEGATIVE
Glucose, UA: NEGATIVE mg/dL
Ketones, POC UA: NEGATIVE mg/dL
Nitrite, UA: POSITIVE — AB
Protein Ur, POC: 30 mg/dL — AB
Spec Grav, UA: 1.02 (ref 1.010–1.025)
Urobilinogen, UA: 0.2 E.U./dL
pH, UA: 5.5 (ref 5.0–8.0)

## 2020-09-29 LAB — POCT FASTING CBG KUC MANUAL ENTRY: POCT Glucose (KUC): 152 mg/dL — AB (ref 70–99)

## 2020-09-29 MED ORDER — CEPHALEXIN 500 MG PO CAPS: 500.0000 mg | ORAL_CAPSULE | Freq: Four times a day (QID) | ORAL | 0 refills | Status: AC

## 2020-09-29 MED ORDER — LIDOCAINE HCL (PF) 1 % IJ SOLN
2.0000 mL | Freq: Once | INTRAMUSCULAR | Status: AC
  Administered 2020-09-29: 21:00:00 2.1 mL via INTRADERMAL
  Filled 2020-09-29: qty 30

## 2020-09-29 MED ORDER — CEFTRIAXONE SODIUM 1 G IJ SOLR
1.0000 g | Freq: Once | INTRAMUSCULAR | Status: AC
  Administered 2020-09-29: 21:00:00 1 g via INTRAMUSCULAR
  Filled 2020-09-29: qty 10

## 2020-09-29 NOTE — ED Triage Notes (Signed)
Pt went to urgent care today for symptoms of UTI and when BP checked they obtained systolic of 70 and EMS repeated x2 and systolic 678 and 893

## 2020-09-29 NOTE — ED Provider Notes (Signed)
[facility] EMERGENCY DEPARTMENT Provider Note   CSN: [id number] Arrival date & time: 09/29/20  7341     History Chief Complaint  Patient presents with  . Hypotension    Brian Mcmillan is a 86 y.o. male.  Patient complains of dysuria.   Dysuria Presenting symptoms: dysuria   Relieved by:  Nothing Worsened by:  Nothing Ineffective treatments:  None tried Associated symptoms: no abdominal pain, no diarrhea, no hematuria and no urinary frequency   Risk factors: no bladder surgery        Past Medical History:  Diagnosis Date  . Abdominal mass 2008   chronic inflammatory mass of the mesentary Dr [name]  . 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 Anemia   . Anginal pain ([place])   . CAD (coronary artery disease)    cath 09/23/09 70% Dx1 (diffuse disease), 40% Cx, Large RCA 80-90% with heavy calcification / cath 06/2010 no change tiht RCA still best to treat medially with nosebleeds  . Chest pain    myoview 2006 no ischemia/ myoview 2009 no ischemia  . CHF (congestive heart failure) ([place])   . CKD (chronic kidney disease)   . COPD (chronic obstructive pulmonary disease) ([place])   . COVID-19   . Dyslipidemia   . GERD (gastroesophageal reflux disease)   . Gout   . History of bleeding ulcers    "had 13 back in the early 1980s" (09/28/2016)  . History of blood transfusion 09/28/2016  . Hypertension    no RAS  . Hypothyroidism   . Kidney mass 2008   radiofrequency ablation at [facility]  . Left ventricular dysfunction    myoview 2006 normal / myoview 2009 normal / EF 60% cath 09/2009, EF 60% cath 06/2010  . Memory impairment    memory decreasing 06/2010  . Myocardial infarction ([place]) <2016X 1; 2016   "@ [facility]; @ [facility]"  . Nosebleed "frequently since ~ 1966"   significant, can not take ASA  . Pneumonia 08/2016-09/2016  . Rash Sept 2011   rash on buttocks, hospitalized hydralazine stopped but then restarted w/o return of rash  . Renal cancer ([place])   . Skin  cancer of scalp   . Systolic click    no mitral valve prolapse  . Type II diabetes mellitus [facility])     Patient Active Problem List   Diagnosis Date Noted  . GERD (gastroesophageal reflux disease) 07/24/2019  . Hoarseness 07/24/2019  . Goals of care, counseling/discussion   . Acute blood loss anemia 09/28/2016  . CAD (coronary artery disease)   . Pancytopenia ([place])   . Epistaxis, recurrent   . Hyperuricemia   . Acute myeloid leukemia in adult [facility])   . AKI (acute kidney injury) ([place])   . Troponin level elevated   . Leukocytosis   . Lobar pneumonia, unspecified organism ([place])   . Acute on chronic kidney failure ([place]) 09/04/2016  . Acute on chronic systolic CHF (congestive heart failure) ([place]) 09/04/2016  . Thrombocytopenia ([place]) 09/04/2016  . Cellulitis 09/04/2016  . Chronic combined systolic and diastolic CHF (congestive heart failure) ([place]) 09/04/2016  . Paroxysmal atrial fibrillation (HCC)   . Cricopharyngeal dysphagia 11/06/2015  . Esophageal dysphagia 11/06/2015  . Carotid disease, bilateral ([place]) 12/05/2014  . TIA (transient ischemic attack) 12/04/2014  . MGUS (monoclonal gammopathy of unknown significance) 03/29/2011  . Renal cancer ([place]) 03/29/2011    Class: Diagnosis of  . Shortness of breath 12/24/2010  . EDEMA 11/17/2010  . HEADACHE 10/28/2009  . Coronary  artery disease due to lipid rich plaque 09/25/2009  . NOSEBLEED 09/22/2009  . Hypothyroidism, acquired 06/29/2009  . DYSLIPIDEMIA 06/29/2009  . Essential hypertension 06/29/2009  . CHEST PAIN 06/29/2009    Past Surgical History:  Procedure Laterality Date  . BACK SURGERY    . BONE MARROW ASPIRATION     X2  . BONE MARROW BIOPSY     X2  . CARDIAC CATHETERIZATION    . CATARACT EXTRACTION W/ INTRAOCULAR LENS  IMPLANT, BILATERAL Bilateral   . COLONOSCOPY N/A 03/07/2013   Procedure: COLONOSCOPY;  Surgeon: [name], MD;  Location: [facility] ENDO SUITE;  Service: Endoscopy;  Laterality: N/A;  830-moved to 29  Ann to notify pt  . COLONOSCOPY  01/2013  . LUMBAR DISC SURGERY     "L4-5"  . LYMPH NODE DISSECTION Right     abdomen  . LYMPH NODE DISSECTION Left    robotic laser @ [facility]  on left side of stomach  . MOHS SURGERY     scalp; "22 stitches around; 9 stitches down; went all the way to the skull"  . NASAL SEPTUM SURGERY    . RFA RIGHT KIDNEY    . TONSILLECTOMY  1933       Family History  Problem Relation Age of Onset  . Cancer Maternal Grandfather   . Coronary artery disease Other     Social History   Tobacco Use  . Smoking status: Former Smoker    Packs/day: 0.50    Years: 42.00    Pack years: 21.00    Types: Cigarettes    Quit date: 10/11/1980    Years since quitting: 39.9  . Smokeless tobacco: Never Used  Substance Use Topics  . Alcohol use: Not Currently    Alcohol/week: 0.0 standard drinks    Comment: 09/28/2016 "don't drink at all anymore; nothing in years; used to have a social drink"  . Drug use: No    Home Medications Prior to Admission medications   Medication Sig Start Date End Date Taking? Authorizing Provider  acetaminophen (TYLENOL) 500 MG tablet Take 500 mg by mouth every 6 (six) hours as needed for moderate pain.   Yes [provider]  albuterol-ipratropium (COMBIVENT) 18-103 MCG/ACT inhaler Inhale 2-4 puffs into the lungs 2 (two) times daily as needed for wheezing.    Yes [provider]  allopurinol (ZYLOPRIM) 100 MG tablet Take 1 tablet (100 mg total) by mouth 2 (two) times daily. Patient taking differently: Take 100 mg by mouth daily. 09/13/16  Yes Annita Brod, MD  amLODipine (NORVASC) 5 MG tablet Take 1 tablet (5 mg total) by mouth daily. 08/02/20  Yes Noemi Chapel, MD  carvedilol (COREG) 3.125 MG tablet Take 1 tablet by mouth 2 (two) times daily with a meal.    Yes [provider]  Cholecalciferol (VITAMIN D3) 5000 UNITS CAPS Take 5,000 Units by mouth daily.    Yes [provider]  clotrimazole (LOTRIMIN) 1 %  cream Apply topically daily. 04/15/20  Yes [provider]  cyanocobalamin 1000 MCG tablet Take 2 tablets by mouth daily.  03/17/11  Yes [provider]  famotidine (PEPCID) 20 MG tablet Take 1 tablet (20 mg total) by mouth at bedtime. 07/22/20  Yes Rehman, Mechele Dawley, MD  furosemide (LASIX) 40 MG tablet Take 20 mg by mouth daily.    Yes [provider]  hydroxyurea (HYDREA) 500 MG capsule Take 500 mg by mouth daily. At 4:30 pm 03/23/19  Yes [provider]  levothyroxine (SYNTHROID, LEVOTHROID) 125 MCG tablet Take 125 mcg by mouth daily.   Yes [provider]  metFORMIN (GLUCOPHAGE) 500 MG tablet Take 500 mg by mouth daily.   Yes [provider]  montelukast (SINGULAIR) 10 MG tablet Take 10 mg by mouth at bedtime.   Yes [provider]  nitroGLYCERIN (NITRODUR - DOSED IN MG/24 HR) 0.6 mg/hr patch Place 0.6 mg onto the skin daily.   Yes [provider]  nitroGLYCERIN (NITROSTAT) 0.4 MG SL tablet Place 0.4 mg under the tongue every 5 (five) minutes as needed for chest pain.   Yes [provider]  Omega-3 Fatty Acids (FISH OIL) 1000 MG CPDR Take 1,200 mg by mouth daily.    Yes [provider]  pantoprazole (PROTONIX) 40 MG tablet Take 1 tablet (40 mg total) by mouth daily before breakfast. 07/22/20  Yes Rehman, Mechele Dawley, MD  potassium chloride (KLOR-CON) 10 MEQ tablet Take 10 mEq by mouth daily. 10/22/19  Yes [provider]  pravastatin (PRAVACHOL) 20 MG tablet Take 20 mg by mouth every evening.   Yes [provider]  sacubitril-valsartan (ENTRESTO) 24-26 MG  03/06/20  Yes [provider]  SSD 1 % cream Apply topically daily. 05/20/20  Yes [provider]  tamsulosin (FLOMAX) 0.4 MG CAPS capsule Take 0.4 mg by mouth daily.   Yes [provider]  tiotropium (SPIRIVA) 18 MCG inhalation capsule Place 18 mcg into inhaler and inhale daily.   Yes [provider]  cephALEXin  (KEFLEX) 500 MG capsule Take 1 capsule (500 mg total) by mouth 4 (four) times daily. 09/29/20   Milton Ferguson, MD    Allergies    Ranolazine, Simvastatin, Aspirin, Contrast media [iodinated diagnostic agents], Ibuprofen, and Ioxaglate  Review of Systems   Review of Systems  Constitutional: Negative for appetite change and fatigue.  HENT: Negative for congestion, ear discharge and sinus pressure.   Eyes: Negative for discharge.  Respiratory: Negative for cough.   Cardiovascular: Negative for chest pain.  Gastrointestinal: Negative for abdominal pain and diarrhea.  Genitourinary: Positive for dysuria. Negative for frequency and hematuria.  Musculoskeletal: Negative for back pain.  Skin: Negative for rash.  Neurological: Negative for seizures and headaches.  Psychiatric/Behavioral: Negative for hallucinations.    Physical Exam Updated Vital Signs BP (!) 171/87   Pulse 76   Temp 98 F (36.7 C) (Oral)   Resp 18   Wt 74.3 kg   SpO2 99%   BMI 21.03 kg/m   Physical Exam Vitals reviewed.  Constitutional:      Appearance: He is well-developed.  HENT:     Head: Normocephalic.     Nose: Nose normal.  Eyes:     General: No scleral icterus.    Extraocular Movements: EOM normal.     Conjunctiva/sclera: Conjunctivae normal.  Neck:     Thyroid: No thyromegaly.  Cardiovascular:     Rate and Rhythm: Normal rate and regular rhythm.     Heart sounds: No murmur heard. No friction rub. No gallop.   Pulmonary:     Breath sounds: No stridor. No wheezing or rales.  Chest:     Chest wall: No tenderness.  Abdominal:     General: There is no distension.     Tenderness: There is no abdominal tenderness. There is no rebound.  Musculoskeletal:        General: No edema. Normal range of motion.     Cervical back: Neck supple.  Lymphadenopathy:  Cervical: No cervical adenopathy.  Skin:    Findings: No erythema or rash.  Neurological:     Mental Status: He is alert and oriented to  person, place, and time.     Motor: No abnormal muscle tone.     Coordination: Coordination normal.  Psychiatric:        Mood and Affect: Mood and affect normal.        Behavior: Behavior normal.     ED Results / Procedures / Treatments   Labs (all labs ordered are listed, but only abnormal results are displayed) Labs Reviewed  CBC WITH DIFFERENTIAL/PLATELET - Abnormal; Notable for the following components:      Result Value   WBC 3.8 (*)    RBC 4.15 (*)    MCV 102.4 (*)    Platelets 93 (*)    Neutro Abs 1.3 (*)    All other components within normal limits  COMPREHENSIVE METABOLIC PANEL - Abnormal; Notable for the following components:   Glucose, Bld 166 (*)    BUN 33 (*)    Creatinine, Ser 1.97 (*)    Albumin 3.2 (*)    Total Bilirubin 1.4 (*)    GFR, Estimated 31 (*)    All other components within normal limits  URINALYSIS, ROUTINE W REFLEX MICROSCOPIC - Abnormal; Notable for the following components:   APPearance HAZY (*)    Hgb urine dipstick SMALL (*)    Leukocytes,Ua LARGE (*)    WBC, UA >50 (*)    Bacteria, UA RARE (*)    All other components within normal limits    EKG None  Radiology No results found.  Procedures Procedures (including critical care time)  Medications Ordered in ED Medications  cefTRIAXone (ROCEPHIN) injection 1 g (1 g Intramuscular Given 09/29/20 2035)  lidocaine (PF) (XYLOCAINE) 1 % injection 2 mL (2.1 mLs Intradermal Given 09/29/20 2036)    ED Course  I have reviewed the triage vital signs and the nursing notes.  Pertinent labs & imaging results that were available during my care of the patient were reviewed by me and considered in my medical decision making (see chart for details).    MDM Rules/Calculators/A&P                          Patient with urinary tract infection.  Patient given Rocephin and Keflex and will follow up with PCP Final Clinical Impression(s) / ED Diagnoses Final diagnoses:  Acute cystitis without  hematuria    Rx / DC Orders ED Discharge Orders         Ordered    cephALEXin (KEFLEX) 500 MG capsule  4 times daily        09/29/20 2106           Milton Ferguson, MD 09/29/20 2110

## 2020-09-29 NOTE — ED Triage Notes (Signed)
Pt also having some pain with urination, that began 2 weeks ago

## 2020-09-29 NOTE — ED Notes (Signed)
ED Provider at bedside. 

## 2020-09-29 NOTE — ED Triage Notes (Signed)
Pt presents with increased fatigue and increased glucose, [facility] to come in for UA and CBG

## 2020-09-29 NOTE — Discharge Instructions (Addendum)
Drink plenty of fluids and follow-up with your doctor next week for recheck °

## 2020-09-29 NOTE — ED Provider Notes (Signed)
[place]   MRN: [id number] DOB: 08/03/1925  Subjective:   CARLTON BUSKEY is a 86 y.o. male presenting for 2 week history of persistent fatigue, dysuria, malaise. Patient goes to the [place] and was advised he come in for an evaluation to our clinic.   No current facility-administered medications for this encounter.  Current Outpatient Medications:    acetaminophen (TYLENOL) 500 MG tablet, Take 500 mg by mouth every 6 (six) hours as needed for moderate pain., Disp: , Rfl:    albuterol-ipratropium (COMBIVENT) 18-103 MCG/ACT inhaler, Inhale 2-4 puffs into the lungs 2 (two) times daily as needed for wheezing. , Disp: , Rfl:    allopurinol (ZYLOPRIM) 100 MG tablet, Take 1 tablet (100 mg total) by mouth 2 (two) times daily. (Patient taking differently: Take 100 mg by mouth daily. ), Disp: 60 tablet, Rfl: 1   amLODipine (NORVASC) 5 MG tablet, Take 1 tablet (5 mg total) by mouth daily., Disp: 30 tablet, Rfl: 1   carvedilol (COREG) 3.125 MG tablet, Take 1 tablet by mouth 2 (two) times daily with a meal. , Disp: , Rfl:    Cholecalciferol (VITAMIN D3) 5000 UNITS CAPS, Take 5,000 Units by mouth daily. , Disp: , Rfl:    cyanocobalamin 1000 MCG tablet, Take 2 tablets by mouth daily. , Disp: , Rfl:    famotidine (PEPCID) 20 MG tablet, Take 1 tablet (20 mg total) by mouth at bedtime., Disp: 90 tablet, Rfl: 3   furosemide (LASIX) 40 MG tablet, Take 20 mg by mouth daily. , Disp: , Rfl:    hydroxyurea (HYDREA) 500 MG capsule, Take 500 mg by mouth daily. At 4:30 pm, Disp: , Rfl:    levothyroxine (SYNTHROID, LEVOTHROID) 125 MCG tablet, Take 125 mcg by mouth daily. , Disp: , Rfl:    metFORMIN (GLUCOPHAGE) 500 MG tablet, Take 500 mg by mouth daily., Disp: , Rfl:    montelukast (SINGULAIR) 10 MG tablet, Take 10 mg by mouth at bedtime. , Disp: , Rfl:    nitroGLYCERIN (NITRODUR - DOSED IN MG/24 HR) 0.6 mg/hr patch, Place 0.6 mg onto the skin daily., Disp: , Rfl:    nitroGLYCERIN  (NITROSTAT) 0.4 MG SL tablet, Place 0.4 mg under the tongue every 5 (five) minutes as needed for chest pain., Disp: , Rfl:    Omega-3 Fatty Acids (FISH OIL) 1000 MG CPDR, Take 1,200 mg by mouth daily. , Disp: , Rfl:    pantoprazole (PROTONIX) 40 MG tablet, Take 1 tablet (40 mg total) by mouth daily before breakfast., Disp: 90 tablet, Rfl: 3   potassium chloride (KLOR-CON) 10 MEQ tablet, Take 10 mEq by mouth daily., Disp: , Rfl:    pravastatin (PRAVACHOL) 20 MG tablet, Take 20 mg by mouth every evening., Disp: , Rfl:    tamsulosin (FLOMAX) 0.4 MG CAPS capsule, Take 0.4 mg by mouth daily., Disp: , Rfl:    tiotropium (SPIRIVA) 18 MCG inhalation capsule, Place 18 mcg into inhaler and inhale daily. 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   Anemia    Anginal pain ([place])    CAD (coronary artery disease)    cath 09/23/09 70% Dx1 (diffuse disease), 40% Cx, Large RCA 80-90% with heavy calcification / cath 06/2010 no change tiht RCA still best to treat medially with nosebleeds   Chest pain    myoview 2006 no ischemia/ myoview 2009 no ischemia   CHF (congestive heart failure) ([facility])    CKD (chronic kidney disease)    COPD (chronic obstructive pulmonary disease) ([place])    COVID-19    Dyslipidemia    GERD (gastroesophageal reflux disease)    Gout    History of bleeding ulcers    "had 13 back in the early 1980s"  (09/28/2016)   History of blood transfusion 09/28/2016   Hypertension    no RAS   Hypothyroidism    Kidney mass 2008   radiofrequency ablation at [facility]   Left ventricular dysfunction    myoview 2006 normal / myoview 2009 normal / EF 60% cath 09/2009, EF 60% cath 06/2010   Memory impairment    memory decreasing 06/2010   Myocardial infarction [facility]) <2016X 1; 2016   "@ [facility]; @ [facility]"   Nosebleed "frequently since ~ 1966"   significant, can not take ASA   Pneumonia 08/2016-09/2016   Rash Sept 2011   rash on buttocks, hospitalized hydralazine stopped but then restarted w/o return of rash   Renal cancer ([place])    Skin cancer of scalp    Systolic click    no mitral valve prolapse   Type II diabetes mellitus ([place])      Past Surgical History:  Procedure Laterality Date   BACK SURGERY     BONE MARROW ASPIRATION     X2   BONE MARROW BIOPSY     X2   CARDIAC CATHETERIZATION     CATARACT EXTRACTION W/ INTRAOCULAR LENS  IMPLANT, BILATERAL Bilateral    COLONOSCOPY N/A 03/07/2013   Procedure: COLONOSCOPY;  Surgeon: [name], MD;  Location[facility]O [facility];  Service: Endoscopy;  Laterality: N/A;  830-moved to 38 Ann to notify pt   COLONOSCOPY  01/2013   LUMBAR DISC SURGERY     "L4-5"   LYMPH NODE DISSECTION Right     abdomen   LYMPH NODE DISSECTION Left    robotic laser @ [facility]  on left side of stomach   MOHS SURGERY     scalp; "22 stitches around; 9 stitches down; went all the way to the skull"   NASAL SEPTUM SURGERY     RFA RIGHT KIDNEY     TONSILLECTOMY  1933    Family History  Problem Relation Age of Onset   Cancer Maternal Grandfather    Coronary artery disease Other     Social History   Tobacco Use   Smoking status: Former Smoker    Packs/day: 0.50    Years: 42.00    Pack years: 21.00    Types: Cigarettes    Quit date: 10/11/1980    Years since quitting: 39.9   Smokeless tobacco: Never Used  Substance Use Topics   Alcohol  use: Yes    Alcohol/week: 0.0 standard drinks    Comment: 09/28/2016 "don't drink at all anymore; nothing in years; used to have a social drink"   Drug use: No    ROS   Objective:  Vitals: BP (!) 87/56    Pulse 86    Temp 98 F (36.7 C)    Resp 18    SpO2 93%   BP Readings from Last 3 Encounters:  09/29/20 (!) 72/43  08/02/20 (!) 156/85  07/22/20 128/67   Wt Readings from Last 3 Encounters:  08/01/20 185 lb (83.9 kg)  07/22/20 185 lb (83.9 kg)  01/10/20 180 lb 0.8 oz (81.7 kg)   Temp Readings from Last 3 Encounters:  09/29/20 98 F (36.7 C)  08/02/20 97.8 F (36.6 C) (Oral)  07/22/20 98.7 F (37.1 C) (Oral)   BP Readings from Last 3 Encounters:  09/29/20 (!) 87/56  08/02/20 (!) 156/85  07/22/20 128/67   Pulse Readings from Last 3 Encounters:  09/29/20 86  08/01/20 77  07/22/20 73   Physical Exam Constitutional:      General: He is not in acute distress.    Appearance: Normal appearance. He is well-developed and well-nourished. He is not ill-appearing, toxic-appearing or diaphoretic.  HENT:     Head: Normocephalic and atraumatic.     Right Ear: External ear normal.     Left Ear: External ear normal.     Nose: Nose normal.     Mouth/Throat:     Mouth: Oropharynx is clear and moist. Mucous membranes are moist.     Pharynx: Oropharynx is clear.  Eyes:     General: No scleral icterus.    Extraocular Movements: Extraocular movements intact.     Pupils: Pupils are equal, round, and reactive to light.  Cardiovascular:     Rate and Rhythm: Normal rate and regular rhythm.     Pulses: Intact distal pulses.     Heart sounds: Normal heart sounds. No murmur heard. No friction rub. No gallop.   Pulmonary:     Effort: Pulmonary effort is normal. No respiratory distress.     Breath sounds: Normal breath sounds. No stridor. No wheezing, rhonchi or rales.  Abdominal:     General: Bowel sounds are normal. There is no distension.     Palpations: Abdomen is soft.  There is no mass.     Tenderness: There is no abdominal tenderness. There is no guarding or rebound.  Skin:    General: Skin is warm and dry.  Neurological:     Mental Status: He is alert and oriented to person, place, and time.  Psychiatric:        Mood and Affect: Mood and affect and mood normal.        Behavior: Behavior normal.     Results for orders placed or performed during the hospital encounter of 09/29/20 (from the past 24 hour(s))  POCT CBG (manual entry)     Status: Abnormal   Collection Time: 09/29/20  5:49 PM  Result Value Ref Range   POCT Glucose ([facility]) 152 (A) 70 - 99 mg/dL  POCT urinalysis dipstick     Status: Abnormal   Collection Time: 09/29/20  5:53 PM  Result Value Ref Range   Color, UA yellow yellow   Clarity, UA cloudy (A) clear   Glucose, UA negative negative mg/dL   Bilirubin, UA negative negative   Ketones, POC UA negative negative mg/dL   Spec Grav, UA 1.020 1.010 - 1.025   Blood, UA moderate (A) negative   pH, UA 5.5 5.0 - 8.0   Protein Ur, POC =30 (A) negative mg/dL   Urobilinogen, UA 0.2 0.2 or 1.0 E.U./dL   Nitrite, UA Positive (A) Negative  Leukocytes, UA Moderate (2+) (A) Negative    Assessment and Plan :   PDMP not reviewed this encounter.  1. Dysuria   2. Fatigue, unspecified type   3. Hypotension, unspecified hypotension type     Patient is hypotensive, positive nitrites, concern is for urosepsis. An IV line was established for fluid bolus whilst EMS arrived. Line successfully established and patient care transferred to EMS.   Jaynee Eagles, [place] 09/29/20 1818

## 2020-09-29 NOTE — ED Notes (Signed)
[facility] notified of bp

## 2020-10-01 LAB — URINE CULTURE: Culture: >=100000 — AB

## 2020-10-09 ENCOUNTER — Other Ambulatory Visit: Payer: Self-pay

## 2020-10-09 ENCOUNTER — Emergency Department (HOSPITAL_COMMUNITY): Payer: No Typology Code available for payment source

## 2020-10-09 ENCOUNTER — Encounter (HOSPITAL_COMMUNITY): Payer: Self-pay | Admitting: Emergency Medicine

## 2020-10-09 ENCOUNTER — Emergency Department (HOSPITAL_COMMUNITY)
Admission: EM | Admit: 2020-10-09 | Discharge: 2020-10-09 | Disposition: A | Discharge status: Home / Self Care | Payer: No Typology Code available for payment source | Attending: Emergency Medicine | Admitting: Emergency Medicine

## 2020-10-09 DIAGNOSIS — Z8616 Personal history of COVID-19: Secondary | ICD-10-CM | POA: Insufficient documentation

## 2020-10-09 DIAGNOSIS — R55 Syncope and collapse: Secondary | ICD-10-CM | POA: Diagnosis not present

## 2020-10-09 DIAGNOSIS — Z79899 Other long term (current) drug therapy: Secondary | ICD-10-CM | POA: Diagnosis not present

## 2020-10-09 DIAGNOSIS — Z87891 Personal history of nicotine dependence: Secondary | ICD-10-CM | POA: Diagnosis not present

## 2020-10-09 DIAGNOSIS — E039 Hypothyroidism, unspecified: Secondary | ICD-10-CM | POA: Insufficient documentation

## 2020-10-09 DIAGNOSIS — Z8673 Personal history of transient ischemic attack (TIA), and cerebral infarction without residual deficits: Secondary | ICD-10-CM | POA: Diagnosis not present

## 2020-10-09 DIAGNOSIS — I130 Hypertensive heart and chronic kidney disease with heart failure and stage 1 through stage 4 chronic kidney disease, or unspecified chronic kidney disease: Secondary | ICD-10-CM | POA: Insufficient documentation

## 2020-10-09 DIAGNOSIS — R251 Tremor, unspecified: Secondary | ICD-10-CM | POA: Insufficient documentation

## 2020-10-09 DIAGNOSIS — E1122 Type 2 diabetes mellitus with diabetic chronic kidney disease: Secondary | ICD-10-CM | POA: Diagnosis not present

## 2020-10-09 DIAGNOSIS — Z85528 Personal history of other malignant neoplasm of kidney: Secondary | ICD-10-CM | POA: Diagnosis not present

## 2020-10-09 DIAGNOSIS — N189 Chronic kidney disease, unspecified: Secondary | ICD-10-CM | POA: Insufficient documentation

## 2020-10-09 DIAGNOSIS — Z955 Presence of coronary angioplasty implant and graft: Secondary | ICD-10-CM | POA: Diagnosis not present

## 2020-10-09 DIAGNOSIS — I959 Hypotension, unspecified: Secondary | ICD-10-CM | POA: Diagnosis not present

## 2020-10-09 DIAGNOSIS — K429 Umbilical hernia without obstruction or gangrene: Secondary | ICD-10-CM | POA: Insufficient documentation

## 2020-10-09 DIAGNOSIS — I5042 Chronic combined systolic (congestive) and diastolic (congestive) heart failure: Secondary | ICD-10-CM | POA: Insufficient documentation

## 2020-10-09 DIAGNOSIS — I2510 Atherosclerotic heart disease of native coronary artery without angina pectoris: Secondary | ICD-10-CM | POA: Diagnosis not present

## 2020-10-09 LAB — URINALYSIS, ROUTINE W REFLEX MICROSCOPIC
Bacteria, UA: NONE SEEN
Bilirubin Urine: NEGATIVE
Glucose, UA: NEGATIVE mg/dL
Ketones, ur: NEGATIVE mg/dL
Leukocytes,Ua: NEGATIVE
Nitrite: NEGATIVE
Protein, ur: NEGATIVE mg/dL
Specific Gravity, Urine: 1.01 (ref 1.005–1.030)
Waxy Casts, UA: 1
pH: 5 (ref 5.0–8.0)

## 2020-10-09 LAB — CBC WITH DIFFERENTIAL/PLATELET
Abs Immature Granulocytes: 0.01 10*3/uL (ref 0.00–0.07)
Basophils Absolute: 0 10*3/uL (ref 0.0–0.1)
Basophils Relative: 0 %
Eosinophils Absolute: 0 10*3/uL (ref 0.0–0.5)
Eosinophils Relative: 0 %
HCT: 39.6 % (ref 39.0–52.0)
Hemoglobin: 12.7 g/dL — ABNORMAL LOW (ref 13.0–17.0)
Immature Granulocytes: 0 %
Lymphocytes Relative: 47 %
Lymphs Abs: 1.9 10*3/uL (ref 0.7–4.0)
MCH: 32.7 pg (ref 26.0–34.0)
MCHC: 32.1 g/dL (ref 30.0–36.0)
MCV: 102.1 fL — ABNORMAL HIGH (ref 80.0–100.0)
Monocytes Absolute: 0.9 10*3/uL (ref 0.1–1.0)
Monocytes Relative: 21 %
Neutro Abs: 1.3 10*3/uL — ABNORMAL LOW (ref 1.7–7.7)
Neutrophils Relative %: 32 %
Platelets: 79 10*3/uL — ABNORMAL LOW (ref 150–400)
RBC: 3.88 MIL/uL — ABNORMAL LOW (ref 4.22–5.81)
RDW: 14.7 % (ref 11.5–15.5)
WBC: 4 10*3/uL (ref 4.0–10.5)
nRBC: 0 % (ref 0.0–0.2)

## 2020-10-09 LAB — COMPREHENSIVE METABOLIC PANEL
ALT: 16 U/L (ref 0–44)
AST: 70 U/L — ABNORMAL HIGH (ref 15–41)
Albumin: 2.9 g/dL — ABNORMAL LOW (ref 3.5–5.0)
Alkaline Phosphatase: 90 U/L (ref 38–126)
Anion gap: 12 (ref 5–15)
BUN: 34 mg/dL — ABNORMAL HIGH (ref 8–23)
CO2: 23 mmol/L (ref 22–32)
Calcium: 9.8 mg/dL (ref 8.9–10.3)
Chloride: 104 mmol/L (ref 98–111)
Creatinine, Ser: 2.03 mg/dL — ABNORMAL HIGH (ref 0.61–1.24)
GFR, Estimated: 30 mL/min — ABNORMAL LOW (ref >60)
Glucose, Bld: 193 mg/dL — ABNORMAL HIGH (ref 70–99)
Potassium: 5.2 mmol/L — ABNORMAL HIGH (ref 3.5–5.1)
Sodium: 139 mmol/L (ref 135–145)
Total Bilirubin: 1.5 mg/dL — ABNORMAL HIGH (ref 0.3–1.2)
Total Protein: 7.4 g/dL (ref 6.5–8.1)

## 2020-10-09 LAB — CBG MONITORING, ED: Glucose-Capillary: 176 mg/dL — ABNORMAL HIGH (ref 70–99)

## 2020-10-09 LAB — POC OCCULT BLOOD, ED: Fecal Occult Bld: POSITIVE — AB

## 2020-10-09 MED ORDER — SODIUM CHLORIDE 0.9 % IV BOLUS
1000.0000 mL | Freq: Once | INTRAVENOUS | Status: AC
  Administered 2020-10-09: 18:00:00 1000 mL via INTRAVENOUS

## 2020-10-09 NOTE — ED Triage Notes (Signed)
Pt BIB [facility] from the [place] in [place], pt there for neurology appointment. Staff reports pt had a near syncopal episode. Staff reports at that time pt's SBP 60s, pt has no complaints at this time. VSS with EMS, pt A&O, hx Parkinson's.

## 2020-10-09 NOTE — ED Provider Notes (Signed)
[facility] EMERGENCY DEPARTMENT Provider Note   CSN: [id number] Arrival date & time: 10/09/20  1542     History Chief Complaint  Patient presents with  . Near Syncope    Brian Mcmillan is a 86 y.o. male.  HPI Pt is a 86 y/o male with a history of Parkinson disease, AML, CAD, CHF, CKD, COPD, T2DM who presents to ED after a syncopal episode while at neurology clinic visit at [facility]. Pt is accompanied by son, who states that as pt was walking into clinic he complained of lightheadedness, then had a 30-second syncopal episode while walking around in the clinic. He sat down in his walker and did not fall to the ground or injure his head/neck. On VS check, noted to be hypotensive to the 20B-55H systolic. Sent across the hall to his PCP office and found to be hypotensive on recheck as well. Pt had IV placed at PCP office and was directed to ED. Pt and son deny any recent focal symptoms of infection other than recent UTI - just completed 7 day course of antibiotics yesterday.  On recent records review, pt had recent urgent care visit 09/29/20 for hypotension and was found to have nitrite-positive urine. Directed to ED in [place] at that time.     Past Medical History:  Diagnosis Date  . Abdominal mass 2008   chronic inflammatory mass of the mesentary Dr [name]  . 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 Anemia   . Anginal pain ([place])   . CAD (coronary artery disease)    cath 09/23/09 70% Dx1 (diffuse disease), 40% Cx, Large RCA 80-90% with heavy calcification / cath 06/2010 no change tiht RCA still best to treat medially with nosebleeds  . Chest pain    myoview 2006 no ischemia/ myoview 2009 no ischemia  . CHF (congestive heart failure) ([place])   . CKD (chronic kidney disease)   . COPD (chronic obstructive pulmonary disease) ([place])   . COVID-19   . Dyslipidemia   . GERD (gastroesophageal reflux disease)   . Gout   . History of bleeding ulcers     "had 13 back in the early 1980s" (09/28/2016)  . History of blood transfusion 09/28/2016  . Hypertension    no RAS  . Hypothyroidism   . Kidney mass 2008   radiofrequency ablation at [facility]  . Left ventricular dysfunction    myoview 2006 normal / myoview 2009 normal / EF 60% cath 09/2009, EF 60% cath 06/2010  . Memory impairment    memory decreasing 06/2010  . Myocardial infarction ([place]) <2016X 1; 2016   "@ [facility]; @ [facility]"  . Nosebleed "frequently since ~ 1966"   significant, can not take ASA  . Pneumonia 08/2016-09/2016  . Rash Sept 2011   rash on buttocks, hospitalized hydralazine stopped but then restarted w/o return of rash  . Renal cancer ([place])   . Skin cancer of scalp   . Systolic click    no mitral valve prolapse  . Type II diabetes mellitus [facility])     Patient Active Problem List   Diagnosis Date Noted  . GERD (gastroesophageal reflux disease) 07/24/2019  . Hoarseness 07/24/2019  . Goals of care, counseling/discussion   . Acute blood loss anemia 09/28/2016  . CAD (coronary artery disease)   . Pancytopenia ([place])   . Epistaxis, recurrent   . Hyperuricemia   . Acute myeloid leukemia in adult [facility])   . AKI (acute kidney injury) ([place])   .  Troponin level elevated   . Leukocytosis   . Lobar pneumonia, unspecified organism ([place])   . Acute on chronic kidney failure ([place]) 09/04/2016  . Acute on chronic systolic CHF (congestive heart failure) ([place]) 09/04/2016  . Thrombocytopenia ([place]) 09/04/2016  . Cellulitis 09/04/2016  . Chronic combined systolic and diastolic CHF (congestive heart failure) ([place]) 09/04/2016  . Paroxysmal atrial fibrillation (H[facility])   . Cricopharyngeal dysphagia 11/06/2015  . Esophageal dysphagia 11/06/2015  . Carotid disease, bilateral ([place]) 12/05/2014  . TIA (transient ischemic attack) 12/04/2014  . MGUS (monoclonal gammopathy of unknown significance) 03/29/2011  . Renal cancer ([place]) 03/29/2011    Class: Diagnosis of  . Shortness of breath  12/24/2010  . EDEMA 11/17/2010  . HEADACHE 10/28/2009  . Coronary artery disease due to lipid rich plaque 09/25/2009  . NOSEBLEED 09/22/2009  . Hypothyroidism, acquired 06/29/2009  . DYSLIPIDEMIA 06/29/2009  . Essential hypertension 06/29/2009  . CHEST PAIN 06/29/2009    Past Surgical History:  Procedure Laterality Date  . BACK SURGERY    . BONE MARROW ASPIRATION     X2  . BONE MARROW BIOPSY     X2  . CARDIAC CATHETERIZATION    . CATARACT EXTRACTION W/ INTRAOCULAR LENS  IMPLANT, BILATERAL Bilateral   . COLONOSCOPY N/A 03/07/2013   Procedure: COLONOSCOPY;  Surgeon: [name], MD;  Location: [facility] END[facility]ITE;  Service: Endoscopy;  Laterality: N/A;  830-moved to 48 Ann to notify pt  . COLONOSCOPY  01/2013  . LUMBAR DISC SURGERY     "L4-5"  . LYMPH NODE DISSECTION Right     abdomen  . LYMPH NODE DISSECTION Left    robotic laser @ [facility]  on left side of stomach  . MOHS SURGERY     scalp; "22 stitches around; 9 stitches down; went all the way to the skull"  . NASAL SEPTUM SURGERY    . RFA RIGHT KIDNEY    . TONSILLECTOMY  1933       Family History  Problem Relation Age of Onset  . Cancer Maternal Grandfather   . Coronary artery disease Other     Social History   Tobacco Use  . Smoking status: Former Smoker    Packs/day: 0.50    Years: 42.00    Pack years: 21.00    Types: Cigarettes    Quit date: 10/11/1980    Years since quitting: 40.0  . Smokeless tobacco: Never Used  Substance Use Topics  . Alcohol use: Not Currently    Alcohol/week: 0.0 standard drinks    Comment: 09/28/2016 "don't drink at all anymore; nothing in years; used to have a social drink"  . Drug use: No    Home Medications Prior to Admission medications   Medication Sig Start Date End Date Taking? Authorizing Provider  acetaminophen (TYLENOL) 500 MG tablet Take 500 mg by mouth every 6 (six) hours as needed for moderate pain.    [provider]  albuterol-ipratropium (COMBIVENT) 18-103  MCG/ACT inhaler Inhale 2-4 puffs into the lungs 2 (two) times daily as needed for wheezing.     [provider]  allopurinol (ZYLOPRIM) 100 MG tablet Take 1 tablet (100 mg total) by mouth 2 (two) times daily. Patient taking differently: Take 100 mg by mouth daily. 09/13/16   Annita Brod, MD  amLODipine (NORVASC) 5 MG tablet Take 1 tablet (5 mg total) by mouth daily. 08/02/20   Noemi Chapel, MD  carvedilol (COREG) 3.125 MG tablet Take 1 tablet by mouth 2 (two) times  daily with a meal.     [provider]  cephALEXin (KEFLEX) 500 MG capsule Take 1 capsule (500 mg total) by mouth 4 (four) times daily. 09/29/20   Milton Ferguson, MD  Cholecalciferol (VITAMIN D3) 5000 UNITS CAPS Take 5,000 Units by mouth daily.     [provider]  clotrimazole (LOTRIMIN) 1 % cream Apply topically daily. 04/15/20   [provider]  cyanocobalamin 1000 MCG tablet Take 2 tablets by mouth daily.  03/17/11   [provider]  famotidine (PEPCID) 20 MG tablet Take 1 tablet (20 mg total) by mouth at bedtime. 07/22/20   Rehman, Mechele Dawley, MD  furosemide (LASIX) 40 MG tablet Take 20 mg by mouth daily.     [provider]  hydroxyurea (HYDREA) 500 MG capsule Take 500 mg by mouth daily. At 4:30 pm 03/23/19   [provider]  levothyroxine (SYNTHROID, LEVOTHROID) 125 MCG tablet Take 125 mcg by mouth daily.    [provider]  metFORMIN (GLUCOPHAGE) 500 MG tablet Take 500 mg by mouth daily.    [provider]  montelukast (SINGULAIR) 10 MG tablet Take 10 mg by mouth at bedtime.    [provider]  nitroGLYCERIN (NITRODUR - DOSED IN MG/24 HR) 0.6 mg/hr patch Place 0.6 mg onto the skin daily.    [provider]  nitroGLYCERIN (NITROSTAT) 0.4 MG SL tablet Place 0.4 mg under the tongue every 5 (five) minutes as needed for chest pain.    [provider]  Omega-3 Fatty Acids (FISH OIL) 1000 MG CPDR Take 1,200 mg by mouth daily.      [provider]  pantoprazole (PROTONIX) 40 MG tablet Take 1 tablet (40 mg total) by mouth daily before breakfast. 07/22/20   Rehman, Mechele Dawley, MD  potassium chloride (KLOR-CON) 10 MEQ tablet Take 10 mEq by mouth daily. 10/22/19   [provider]  pravastatin (PRAVACHOL) 20 MG tablet Take 20 mg by mouth every evening.    [provider]  sacubitril-valsartan (ENTRESTO) 24-26 MG  03/06/20   [provider]  SSD 1 % cream Apply topically daily. 05/20/20   [provider]  tamsulosin (FLOMAX) 0.4 MG CAPS capsule Take 0.4 mg by mouth daily.    [provider]  tiotropium (SPIRIVA) 18 MCG inhalation capsule Place 18 mcg into inhaler and inhale daily.    [provider]    Allergies    Ranolazine, Simvastatin, Aspirin, Contrast media [iodinated diagnostic agents], Ibuprofen, and Ioxaglate  Review of Systems   Review of Systems  Constitutional: Negative for chills and fever.  HENT: Positive for hearing loss (chronic). Negative for ear pain, rhinorrhea and sore throat.   Eyes: Negative for pain and visual disturbance.  Respiratory: Negative for cough, shortness of breath and wheezing.   Cardiovascular: Positive for leg swelling (at his baseline). Negative for chest pain.  Gastrointestinal: Negative for abdominal pain, diarrhea, nausea and vomiting.  Musculoskeletal: Positive for neck pain. Negative for gait problem.  Skin: Negative for color change and rash.       Several skin cancers  Neurological: Positive for syncope and light-headedness.  Psychiatric/Behavioral: Negative for confusion and suicidal ideas.  All other systems reviewed and are negative.   Physical Exam Updated Vital Signs BP (!) 167/95 (BP Location: Right Arm)   Pulse 77   Temp 98.1 F (36.7 C) (Oral)   Resp 17   SpO2 99%   Physical Exam Vitals and nursing note reviewed.  Constitutional:  General: He is not in acute distress.    Appearance: Normal  appearance. He is well-developed and well-nourished. He is not toxic-appearing.  HENT:     Head: Normocephalic and atraumatic.     Nose: Nose normal.     Mouth/Throat:     Mouth: Mucous membranes are moist.     Pharynx: Oropharynx is clear.  Eyes:     General: No scleral icterus.    Extraocular Movements: Extraocular movements intact.  Cardiovascular:     Rate and Rhythm: Normal rate and regular rhythm.     Pulses: Normal pulses.     Heart sounds: No murmur heard. No friction rub. No gallop.   Pulmonary:     Effort: Pulmonary effort is normal. No respiratory distress.     Breath sounds: Normal breath sounds. No wheezing, rhonchi or rales.  Abdominal:     General: Bowel sounds are normal. There is no distension.     Palpations: Abdomen is soft.     Tenderness: There is no abdominal tenderness.     Hernia: A hernia (umbilical, reducible) is present.  Genitourinary:    Comments: Chaperoned by Thurmond Butts, RN Musculoskeletal:     Cervical back: Neck supple.     Right lower leg: Edema present.     Left lower leg: Edema present.     Comments: 1+ bilateral lower leg pitting edema  Skin:    General: Skin is warm and dry.  Neurological:     Mental Status: He is alert. Mental status is at baseline.     GCS: GCS eye subscore is 4. GCS verbal subscore is 5. GCS motor subscore is 6.     Cranial Nerves: Cranial nerves are intact. No facial asymmetry.     Sensory: Sensation is intact.     Motor: Tremor present.     Coordination: Finger-Nose-Finger Test normal.  Psychiatric:        Mood and Affect: Mood and affect normal.     ED Results / Procedures / Treatments   Labs (all labs ordered are listed, but only abnormal results are displayed) Labs Reviewed  CBC WITH DIFFERENTIAL/PLATELET - Abnormal; Notable for the following components:      Result Value   RBC 3.88 (*)    Hemoglobin 12.7 (*)    MCV 102.1 (*)    Platelets 79 (*)    Neutro Abs 1.3 (*)    All other components within normal  limits  COMPREHENSIVE METABOLIC PANEL - Abnormal; Notable for the following components:   Potassium 5.2 (*)    Glucose, Bld 193 (*)    BUN 34 (*)    Creatinine, Ser 2.03 (*)    Albumin 2.9 (*)    AST 70 (*)    Total Bilirubin 1.5 (*)    GFR, Estimated 30 (*)    All other components within normal limits  URINALYSIS, ROUTINE W REFLEX MICROSCOPIC - Abnormal; Notable for the following components:   Hgb urine dipstick SMALL (*)    All other components within normal limits  CBG MONITORING, ED - Abnormal; Notable for the following components:   Glucose-Capillary 176 (*)    All other components within normal limits  POC OCCULT BLOOD, ED - Abnormal; Notable for the following components:   Fecal Occult Bld POSITIVE (*)    All other components within normal limits    EKG EKG Interpretation  Date/Time:  Thursday October 09 2020 17:38:25 EST Ventricular Rate:  68 PR Interval:    QRS Duration: 118 QT  Interval:  412 QTC Calculation: 438 R Axis:   60 Text Interpretation: Atrial fibrillation Left ventricular hypertrophy with QRS widening and repolarization abnormality ( Cornell product ) Abnormal ECG Confirmed by Lennice Sites [phone]) on 10/09/2020 5:54:07 PM   Radiology DG Chest 1 View  Result Date: 10/09/2020 CLINICAL DATA:  Near syncope. EXAM: CHEST  1 VIEW COMPARISON:  Radiograph and CT 01/10/2020 FINDINGS: Chronic cardiomegaly. Unchanged mediastinal contours with aortic atherosclerosis. Resolved pleural effusions and patchy airspace opacities from prior exam. Minimal subsegmental atelectasis or scarring in the right mid and upper lung zones. No pneumothorax. No pulmonary edema. No acute osseous abnormalities are seen. IMPRESSION: 1. No acute findings. 2. Chronic cardiomegaly.  Mild multifocal scarring. Electronically Signed   By: Keith Rake M.D.   On: 10/09/2020 17:24   CT Head Wo Contrast  Result Date: 10/09/2020 CLINICAL DATA:  Syncope, recurrent syncopal episode EXAM: CT  HEAD WITHOUT CONTRAST TECHNIQUE: Contiguous axial images were obtained from the base of the skull through the vertex without intravenous contrast. COMPARISON:  With CT 08/02/2020 FINDINGS: Brain: Stable degree of atrophy and chronic small vessel ischemia. No intracranial hemorrhage, mass effect, or midline shift. No hydrocephalus. The basilar cisterns are patent. No evidence of territorial infarct or acute ischemia. Stable diffuse dural calcifications. No extra-axial or intracranial fluid collection. Vascular: Atherosclerosis of skullbase vasculature without hyperdense vessel or abnormal calcification. Skull: No fracture or focal lesion. Sinuses/Orbits: Opacification of lower left mastoid air cells. There is scattered mucosal thickening throughout the ethmoid air cells. No sinus fluid levels. Bilateral cataract resection. Other: None. IMPRESSION: 1. No acute intracranial abnormality. 2. Stable atrophy and chronic small vessel ischemia. Electronically Signed   By: Keith Rake M.D.   On: 10/09/2020 17:55    Procedures Procedures (including critical care time)  Medications Ordered in ED Medications  sodium chloride 0.9 % bolus 1,000 mL (0 mLs Intravenous Stopped 10/09/20 2006)    ED Course  I have reviewed the triage vital signs and the nursing notes.  Pertinent labs & imaging results that were available during my care of the patient were reviewed by me and considered in my medical decision making (see chart for details).    MDM Rules/Calculators/A&P                          86 y/o male with syncopal episode. Pt overall very well appearing.  I am most concerned for vasovagal syncope or orthostasis exacerbated by patient's Parkinson disease, but differential includes anemia, arrhythmia, occult infection (remaining UTI, pneumonia) versus pleural effusion, electrolyte abnormality.  1 L normal saline bolus ordered.  Labs reviewed and notable for stable hemoglobin, elevated but stable creatinine,  mildly elevated AST, no evidence of infection remaining on urinalysis. Hemoccult is positive but pt does not have melenic stool on exam. ECG - QTS 114, QTc 406. There is no STEMI. Undetermined rhythm - appears to have long PR intervals with possible 1st degree AV block. Q waves in anteroseptal leads consistent with old infarct. T wave inversions in lateral leads that have been seen on prior. Imaging reviewed and notable for CT head without acute intracranial abnormality.  CXR shows chronic cardiomegaly without acute cardiopulmonary disease.  On reassessment, pt feeling well. Pt is not orthostatic and has not been hypotensive throughout ED course. No focal evidence of infection. Pt has unusual rhythm on ECG but this is stable from prior.  At this time, pt appears safe for discharge with strict return precautions  provided and instructions for follow up given. Pt and family voiced understanding and are amenable to this plan. Pt discharged in stable condition.  Final Clinical Impression(s) / ED Diagnoses Final diagnoses:  Near syncope  Syncope, unspecified syncope type      Vanna Scotland, MD 83/81/84 0375    [place], [place], DO 10/09/20 2304

## 2020-10-09 NOTE — Discharge Instructions (Addendum)
Please stay hydrated and return to the ED if symptoms worsen as discussed.

## 2020-10-09 NOTE — ED Provider Notes (Signed)
I have personally seen and examined the patient. I have reviewed the documentation on PMH/FH/Soc Hx. I have discussed the plan of care with the resident and patient.  I have reviewed and agree with the resident's documentation. Please see associated encounter note.  Briefly, the patient is a 86 y.o. male here with syncopal event at doctor's office.  Normal vitals here.  Asymptomatic.  Normal neurological exam.  History of leukemia, CKD, possibly Parkinson's but unsure of neurologic diagnosis at this time who presents the ED after syncopal event.  Felt hot and flushed at doctor's office while sitting and when he stood up he fell to the ground.  Did not think he hit his head or fully lost consciousness.  Not have any chest pain or shortness of breath.  Blood pressures were in the 70s per EMS.  Blood pressure is normal now.  Suspect overall vasovagal/orthostatic event.  Recently finished antibiotics for UTI.  Has not had much to eat or drink.  He has not had any black stools or bloody stools.  Stool is grossly brown on exam per my resident.  No significant anemia with a hemoglobin of 12.7.  Potassium 5.2 and creatinine 2.  Overall lab work is unremarkable and fairly at baseline.  Patient feeling better after IV fluids.  Able to stand up and get around without any symptoms.  CT scan of the head showed no head bleed or other acute findings.  Chest x-ray showed no infectious findings.  Urinalysis negative for infection.  Recommend follow-up with primary care doctor.  Told to return to the ED if symptoms worsen including if she develops any black stools or bloody stools, fever, chills, chest pain or if he had another syncopal event.  This chart was dictated using voice recognition software.  Despite best efforts to proofread,  errors can occur which can change the documentation meaning.     EKG Interpretation  Date/Time:  Thursday October 09 2020 17:38:25 EST Ventricular Rate:  68 PR Interval:    QRS  Duration: 118 QT Interval:  412 QTC Calculation: 438 R Axis:   60 Text Interpretation: Atrial fibrillation Left ventricular hypertrophy with QRS widening and repolarization abnormality ( Cornell product ) Abnormal ECG Confirmed by Virgina Norfolk [phone]) on 10/09/2020 5:54:07 PM         Virgina Norfolk, DO 10/09/20 2301

## 2020-10-09 NOTE — ED Notes (Signed)
Patient transported to X-ray 

## 2020-10-10 DIAGNOSIS — I10 Essential (primary) hypertension: Secondary | ICD-10-CM | POA: Diagnosis not present

## 2020-10-10 DIAGNOSIS — J449 Chronic obstructive pulmonary disease, unspecified: Secondary | ICD-10-CM | POA: Diagnosis not present

## 2020-10-10 DIAGNOSIS — E782 Mixed hyperlipidemia: Secondary | ICD-10-CM | POA: Diagnosis not present

## 2020-10-21 ENCOUNTER — Ambulatory Visit (INDEPENDENT_AMBULATORY_CARE_PROVIDER_SITE_OTHER): Payer: Medicare Other | Admitting: Internal Medicine

## 2020-11-18 DIAGNOSIS — B351 Tinea unguium: Secondary | ICD-10-CM | POA: Diagnosis not present

## 2020-11-18 DIAGNOSIS — M79676 Pain in unspecified toe(s): Secondary | ICD-10-CM | POA: Diagnosis not present

## 2020-11-18 DIAGNOSIS — L84 Corns and callosities: Secondary | ICD-10-CM | POA: Diagnosis not present

## 2020-11-18 DIAGNOSIS — I70203 Unspecified atherosclerosis of native arteries of extremities, bilateral legs: Secondary | ICD-10-CM | POA: Diagnosis not present

## 2020-11-26 DIAGNOSIS — D3A00 Benign carcinoid tumor of unspecified site: Secondary | ICD-10-CM | POA: Diagnosis not present

## 2020-11-26 DIAGNOSIS — I5022 Chronic systolic (congestive) heart failure: Secondary | ICD-10-CM | POA: Diagnosis not present

## 2020-11-26 DIAGNOSIS — D84821 Immunodeficiency due to drugs: Secondary | ICD-10-CM | POA: Diagnosis not present

## 2020-11-26 DIAGNOSIS — Z79899 Other long term (current) drug therapy: Secondary | ICD-10-CM | POA: Diagnosis not present

## 2020-11-26 DIAGNOSIS — N289 Disorder of kidney and ureter, unspecified: Secondary | ICD-10-CM | POA: Diagnosis not present

## 2020-11-26 DIAGNOSIS — D72810 Lymphocytopenia: Secondary | ICD-10-CM | POA: Diagnosis not present

## 2020-11-26 DIAGNOSIS — R918 Other nonspecific abnormal finding of lung field: Secondary | ICD-10-CM | POA: Diagnosis not present

## 2020-11-26 DIAGNOSIS — C9200 Acute myeloblastic leukemia, not having achieved remission: Secondary | ICD-10-CM | POA: Diagnosis not present

## 2020-11-26 DIAGNOSIS — D472 Monoclonal gammopathy: Secondary | ICD-10-CM | POA: Diagnosis not present

## 2020-11-26 DIAGNOSIS — D696 Thrombocytopenia, unspecified: Secondary | ICD-10-CM | POA: Diagnosis not present

## 2020-11-26 DIAGNOSIS — I10 Essential (primary) hypertension: Secondary | ICD-10-CM | POA: Diagnosis not present

## 2020-11-26 DIAGNOSIS — S82891A Other fracture of right lower leg, initial encounter for closed fracture: Secondary | ICD-10-CM | POA: Diagnosis not present

## 2020-11-26 DIAGNOSIS — I110 Hypertensive heart disease with heart failure: Secondary | ICD-10-CM | POA: Diagnosis not present

## 2020-11-30 ENCOUNTER — Inpatient Hospital Stay (HOSPITAL_COMMUNITY): Payer: No Typology Code available for payment source

## 2020-11-30 ENCOUNTER — Encounter (HOSPITAL_COMMUNITY): Payer: Self-pay | Admitting: Emergency Medicine

## 2020-11-30 ENCOUNTER — Emergency Department (HOSPITAL_COMMUNITY): Payer: No Typology Code available for payment source

## 2020-11-30 ENCOUNTER — Inpatient Hospital Stay (HOSPITAL_COMMUNITY)
Admission: EM | Admit: 2020-11-30 | Discharge: 2020-12-09 | DRG: 871 | Disposition: E | Payer: No Typology Code available for payment source | Attending: Internal Medicine | Admitting: Internal Medicine

## 2020-11-30 ENCOUNTER — Other Ambulatory Visit: Payer: Self-pay

## 2020-11-30 DIAGNOSIS — W19XXXA Unspecified fall, initial encounter: Secondary | ICD-10-CM | POA: Diagnosis not present

## 2020-11-30 DIAGNOSIS — Z66 Do not resuscitate: Secondary | ICD-10-CM | POA: Diagnosis present

## 2020-11-30 DIAGNOSIS — I2583 Coronary atherosclerosis due to lipid rich plaque: Secondary | ICD-10-CM | POA: Diagnosis present

## 2020-11-30 DIAGNOSIS — N179 Acute kidney failure, unspecified: Secondary | ICD-10-CM

## 2020-11-30 DIAGNOSIS — J9811 Atelectasis: Secondary | ICD-10-CM | POA: Diagnosis present

## 2020-11-30 DIAGNOSIS — Z515 Encounter for palliative care: Secondary | ICD-10-CM | POA: Diagnosis not present

## 2020-11-30 DIAGNOSIS — Z886 Allergy status to analgesic agent status: Secondary | ICD-10-CM

## 2020-11-30 DIAGNOSIS — I2510 Atherosclerotic heart disease of native coronary artery without angina pectoris: Secondary | ICD-10-CM | POA: Diagnosis present

## 2020-11-30 DIAGNOSIS — R7881 Bacteremia: Secondary | ICD-10-CM | POA: Diagnosis not present

## 2020-11-30 DIAGNOSIS — I214 Non-ST elevation (NSTEMI) myocardial infarction: Secondary | ICD-10-CM | POA: Diagnosis present

## 2020-11-30 DIAGNOSIS — G9341 Metabolic encephalopathy: Secondary | ICD-10-CM | POA: Diagnosis present

## 2020-11-30 DIAGNOSIS — N1832 Chronic kidney disease, stage 3b: Secondary | ICD-10-CM | POA: Diagnosis present

## 2020-11-30 DIAGNOSIS — I130 Hypertensive heart and chronic kidney disease with heart failure and stage 1 through stage 4 chronic kidney disease, or unspecified chronic kidney disease: Secondary | ICD-10-CM | POA: Diagnosis present

## 2020-11-30 DIAGNOSIS — Z7984 Long term (current) use of oral hypoglycemic drugs: Secondary | ICD-10-CM

## 2020-11-30 DIAGNOSIS — E785 Hyperlipidemia, unspecified: Secondary | ICD-10-CM | POA: Diagnosis present

## 2020-11-30 DIAGNOSIS — I480 Paroxysmal atrial fibrillation: Secondary | ICD-10-CM | POA: Diagnosis present

## 2020-11-30 DIAGNOSIS — C9200 Acute myeloblastic leukemia, not having achieved remission: Secondary | ICD-10-CM | POA: Diagnosis present

## 2020-11-30 DIAGNOSIS — E1122 Type 2 diabetes mellitus with diabetic chronic kidney disease: Secondary | ICD-10-CM | POA: Diagnosis present

## 2020-11-30 DIAGNOSIS — Z79890 Hormone replacement therapy (postmenopausal): Secondary | ICD-10-CM

## 2020-11-30 DIAGNOSIS — B9620 Unspecified Escherichia coli [E. coli] as the cause of diseases classified elsewhere: Secondary | ICD-10-CM | POA: Diagnosis present

## 2020-11-30 DIAGNOSIS — D61818 Other pancytopenia: Secondary | ICD-10-CM | POA: Diagnosis present

## 2020-11-30 DIAGNOSIS — E872 Acidosis, unspecified: Secondary | ICD-10-CM | POA: Diagnosis present

## 2020-11-30 DIAGNOSIS — R404 Transient alteration of awareness: Secondary | ICD-10-CM | POA: Diagnosis not present

## 2020-11-30 DIAGNOSIS — A4151 Sepsis due to Escherichia coli [E. coli]: Principal | ICD-10-CM | POA: Diagnosis present

## 2020-11-30 DIAGNOSIS — R6521 Severe sepsis with septic shock: Secondary | ICD-10-CM | POA: Diagnosis present

## 2020-11-30 DIAGNOSIS — Z85828 Personal history of other malignant neoplasm of skin: Secondary | ICD-10-CM

## 2020-11-30 DIAGNOSIS — E039 Hypothyroidism, unspecified: Secondary | ICD-10-CM | POA: Diagnosis present

## 2020-11-30 DIAGNOSIS — J9600 Acute respiratory failure, unspecified whether with hypoxia or hypercapnia: Secondary | ICD-10-CM | POA: Diagnosis present

## 2020-11-30 DIAGNOSIS — Z978 Presence of other specified devices: Secondary | ICD-10-CM | POA: Diagnosis not present

## 2020-11-30 DIAGNOSIS — E876 Hypokalemia: Secondary | ICD-10-CM | POA: Diagnosis present

## 2020-11-30 DIAGNOSIS — A419 Sepsis, unspecified organism: Secondary | ICD-10-CM | POA: Diagnosis present

## 2020-11-30 DIAGNOSIS — Z8249 Family history of ischemic heart disease and other diseases of the circulatory system: Secondary | ICD-10-CM

## 2020-11-30 DIAGNOSIS — M109 Gout, unspecified: Secondary | ICD-10-CM | POA: Diagnosis present

## 2020-11-30 DIAGNOSIS — Z91041 Radiographic dye allergy status: Secondary | ICD-10-CM

## 2020-11-30 DIAGNOSIS — H9190 Unspecified hearing loss, unspecified ear: Secondary | ICD-10-CM | POA: Diagnosis present

## 2020-11-30 DIAGNOSIS — I5042 Chronic combined systolic (congestive) and diastolic (congestive) heart failure: Secondary | ICD-10-CM | POA: Diagnosis present

## 2020-11-30 DIAGNOSIS — D696 Thrombocytopenia, unspecified: Secondary | ICD-10-CM | POA: Diagnosis present

## 2020-11-30 DIAGNOSIS — Z20822 Contact with and (suspected) exposure to covid-19: Secondary | ICD-10-CM | POA: Diagnosis present

## 2020-11-30 DIAGNOSIS — K219 Gastro-esophageal reflux disease without esophagitis: Secondary | ICD-10-CM | POA: Diagnosis present

## 2020-11-30 DIAGNOSIS — Z85528 Personal history of other malignant neoplasm of kidney: Secondary | ICD-10-CM

## 2020-11-30 DIAGNOSIS — I10 Essential (primary) hypertension: Secondary | ICD-10-CM | POA: Diagnosis present

## 2020-11-30 DIAGNOSIS — Z87891 Personal history of nicotine dependence: Secondary | ICD-10-CM

## 2020-11-30 DIAGNOSIS — M6282 Rhabdomyolysis: Secondary | ICD-10-CM | POA: Diagnosis present

## 2020-11-30 DIAGNOSIS — Z7189 Other specified counseling: Secondary | ICD-10-CM | POA: Diagnosis not present

## 2020-11-30 DIAGNOSIS — I252 Old myocardial infarction: Secondary | ICD-10-CM

## 2020-11-30 DIAGNOSIS — I959 Hypotension, unspecified: Secondary | ICD-10-CM | POA: Diagnosis not present

## 2020-11-30 DIAGNOSIS — J449 Chronic obstructive pulmonary disease, unspecified: Secondary | ICD-10-CM | POA: Diagnosis present

## 2020-11-30 DIAGNOSIS — Z79899 Other long term (current) drug therapy: Secondary | ICD-10-CM

## 2020-11-30 DIAGNOSIS — Z888 Allergy status to other drugs, medicaments and biological substances status: Secondary | ICD-10-CM

## 2020-11-30 LAB — BLOOD GAS, ARTERIAL
Acid-base deficit: 10.9 mmol/L — ABNORMAL HIGH (ref 0.0–2.0)
Acid-base deficit: 15.5 mmol/L — ABNORMAL HIGH (ref 0.0–2.0)
Acid-base deficit: 16.5 mmol/L — ABNORMAL HIGH (ref 0.0–2.0)
Bicarbonate: 15.3 mmol/L — ABNORMAL LOW (ref 20.0–28.0)
Bicarbonate: 12.9 mmol/L — ABNORMAL LOW (ref 20.0–28.0)
Bicarbonate: 12.1 mmol/L — ABNORMAL LOW (ref 20.0–28.0)
FIO2: 21
FIO2: 100
FIO2: 60
O2 Saturation: 80.2 %
O2 Saturation: 99.5 %
O2 Saturation: 98.9 %
Patient temperature: 37.3
Patient temperature: 37
Patient temperature: 37.7
pCO2 arterial: 38.4 mmHg (ref 32.0–48.0)
pCO2 arterial: 23.5 mmHg — ABNORMAL LOW (ref 32.0–48.0)
pCO2 arterial: 24.6 mmHg — ABNORMAL LOW (ref 32.0–48.0)
pH, Arterial: 7.224 — ABNORMAL LOW (ref 7.350–7.450)
pH, Arterial: 7.262 — ABNORMAL LOW (ref 7.350–7.450)
pH, Arterial: 7.223 — ABNORMAL LOW (ref 7.350–7.450)
pO2, Arterial: 58.9 mmHg — ABNORMAL LOW (ref 83.0–108.0)
pO2, Arterial: 401 mmHg — ABNORMAL HIGH (ref 83.0–108.0)
pO2, Arterial: 245 mmHg — ABNORMAL HIGH (ref 83.0–108.0)

## 2020-11-30 LAB — COMPREHENSIVE METABOLIC PANEL
ALT: 32 U/L (ref 0–44)
AST: 76 U/L — ABNORMAL HIGH (ref 15–41)
Albumin: 2.7 g/dL — ABNORMAL LOW (ref 3.5–5.0)
Alkaline Phosphatase: 108 U/L (ref 38–126)
Anion gap: 10 (ref 5–15)
BUN: 34 mg/dL — ABNORMAL HIGH (ref 8–23)
CO2: 22 mmol/L (ref 22–32)
Calcium: 9.5 mg/dL (ref 8.9–10.3)
Chloride: 110 mmol/L (ref 98–111)
Creatinine, Ser: 1.85 mg/dL — ABNORMAL HIGH (ref 0.61–1.24)
GFR, Estimated: 34 mL/min — ABNORMAL LOW (ref >60)
Glucose, Bld: 74 mg/dL (ref 70–99)
Potassium: 3.3 mmol/L — ABNORMAL LOW (ref 3.5–5.1)
Sodium: 142 mmol/L (ref 135–145)
Total Bilirubin: 2 mg/dL — ABNORMAL HIGH (ref 0.3–1.2)
Total Protein: 6.4 g/dL — ABNORMAL LOW (ref 6.5–8.1)

## 2020-11-30 LAB — URINALYSIS, ROUTINE W REFLEX MICROSCOPIC
Bilirubin Urine: NEGATIVE
Glucose, UA: 50 mg/dL — AB
Ketones, ur: NEGATIVE mg/dL
Leukocytes,Ua: NEGATIVE
Nitrite: NEGATIVE
Protein, ur: 30 mg/dL — AB
Specific Gravity, Urine: 1.014 (ref 1.005–1.030)
pH: 5 (ref 5.0–8.0)

## 2020-11-30 LAB — CBC WITH DIFFERENTIAL/PLATELET
Abs Immature Granulocytes: 0.11 10*3/uL — ABNORMAL HIGH (ref 0.00–0.07)
Basophils Absolute: 0 10*3/uL (ref 0.0–0.1)
Basophils Relative: 0 %
Eosinophils Absolute: 0 10*3/uL (ref 0.0–0.5)
Eosinophils Relative: 0 %
HCT: 39.1 % (ref 39.0–52.0)
Hemoglobin: 12.6 g/dL — ABNORMAL LOW (ref 13.0–17.0)
Immature Granulocytes: 3 %
Lymphocytes Relative: 8 %
Lymphs Abs: 0.3 10*3/uL — ABNORMAL LOW (ref 0.7–4.0)
MCH: 32.4 pg (ref 26.0–34.0)
MCHC: 32.2 g/dL (ref 30.0–36.0)
MCV: 100.5 fL — ABNORMAL HIGH (ref 80.0–100.0)
Monocytes Absolute: 0 10*3/uL — ABNORMAL LOW (ref 0.1–1.0)
Monocytes Relative: 1 %
Neutro Abs: 3.4 10*3/uL (ref 1.7–7.7)
Neutrophils Relative %: 88 %
Platelets: 64 10*3/uL — ABNORMAL LOW (ref 150–400)
RBC: 3.89 MIL/uL — ABNORMAL LOW (ref 4.22–5.81)
RDW: 15.9 % — ABNORMAL HIGH (ref 11.5–15.5)
WBC: 3.8 10*3/uL — ABNORMAL LOW (ref 4.0–10.5)
nRBC: 1.8 % — ABNORMAL HIGH (ref 0.0–0.2)

## 2020-11-30 LAB — CK: Total CK: 1108 U/L — ABNORMAL HIGH (ref 49–397)

## 2020-11-30 LAB — CBG MONITORING, ED
Glucose-Capillary: 146 mg/dL — ABNORMAL HIGH (ref 70–99)
Glucose-Capillary: 35 mg/dL — CL (ref 70–99)
Glucose-Capillary: 53 mg/dL — ABNORMAL LOW (ref 70–99)

## 2020-11-30 LAB — LACTIC ACID, PLASMA
Lactic Acid, Venous: 7.8 mmol/L (ref 0.5–1.9)
Lactic Acid, Venous: 8.3 mmol/L (ref 0.5–1.9)
Lactic Acid, Venous: 10.6 mmol/L (ref 0.5–1.9)
Lactic Acid, Venous: >11 mmol/L (ref 0.5–1.9)

## 2020-11-30 LAB — MAGNESIUM: Magnesium: 1.3 mg/dL — ABNORMAL LOW (ref 1.7–2.4)

## 2020-11-30 LAB — RESP PANEL BY RT-PCR (FLU A&B, COVID) ARPGX2
Influenza A by PCR: NEGATIVE
Influenza B by PCR: NEGATIVE
SARS Coronavirus 2 by RT PCR: NEGATIVE

## 2020-11-30 LAB — GLUCOSE, CAPILLARY: Glucose-Capillary: 88 mg/dL (ref 70–99)

## 2020-11-30 LAB — TROPONIN I (HIGH SENSITIVITY): Troponin I (High Sensitivity): 4234 ng/L (ref <18)

## 2020-11-30 MED ORDER — SODIUM BICARBONATE 8.4 % IV SOLN
INTRAVENOUS | Status: AC
  Filled 2020-11-30: qty 150

## 2020-11-30 MED ORDER — METRONIDAZOLE IN NACL 5-0.79 MG/ML-% IV SOLN
500.0000 mg | Freq: Once | INTRAVENOUS | Status: AC
  Administered 2020-11-30: 500 mg via INTRAVENOUS
  Filled 2020-11-30: qty 100

## 2020-11-30 MED ORDER — CHLORHEXIDINE GLUCONATE 0.12% ORAL RINSE (MEDLINE KIT)
15.0000 mL | Freq: Two times a day (BID) | OROMUCOSAL | Status: DC
  Administered 2020-11-30 – 2020-12-01 (×2): 15 mL via OROMUCOSAL

## 2020-11-30 MED ORDER — MIDAZOLAM HCL 2 MG/2ML IJ SOLN
1.0000 mg | INTRAMUSCULAR | Status: DC | PRN
  Administered 2020-11-30: 1 mg via INTRAVENOUS
  Filled 2020-11-30: qty 2

## 2020-11-30 MED ORDER — MAGNESIUM SULFATE 2 GM/50ML IV SOLN
2.0000 g | Freq: Once | INTRAVENOUS | Status: AC
  Administered 2020-11-30: 2 g via INTRAVENOUS
  Filled 2020-11-30: qty 50

## 2020-11-30 MED ORDER — SODIUM CHLORIDE 0.9 % IV BOLUS: 500.0000 mL | Freq: Once | INTRAVENOUS | Status: DC

## 2020-11-30 MED ORDER — NOREPINEPHRINE 16 MG/250ML-% IV SOLN
0.0000 ug/min | INTRAVENOUS | Status: DC
  Administered 2020-11-30: 45 ug/min via INTRAVENOUS
  Administered 2020-12-01: 25 ug/min via INTRAVENOUS
  Filled 2020-11-30 (×2): qty 250

## 2020-11-30 MED ORDER — LACTATED RINGERS IV BOLUS
1000.0000 mL | Freq: Once | INTRAVENOUS | Status: AC
  Administered 2020-11-30: 1000 mL via INTRAVENOUS

## 2020-11-30 MED ORDER — SODIUM BICARBONATE 8.4 % IV SOLN
INTRAVENOUS | Status: AC
  Administered 2020-11-30: 100 meq via INTRAVENOUS
  Filled 2020-11-30: qty 100

## 2020-11-30 MED ORDER — VASOPRESSIN 20 UNITS/100 ML INFUSION FOR SHOCK
0.0000 [IU]/min | INTRAVENOUS | Status: DC
  Administered 2020-11-30 – 2020-12-01 (×2): 0.03 [IU]/min via INTRAVENOUS
  Filled 2020-11-30 (×4): qty 100

## 2020-11-30 MED ORDER — MIDAZOLAM BOLUS VIA INFUSION
1.0000 mg | INTRAVENOUS | Status: DC | PRN
Start: 2020-11-30 — End: 2020-11-30
  Filled 2020-11-30: qty 2

## 2020-11-30 MED ORDER — LACTATED RINGERS IV BOLUS
500.0000 mL | Freq: Once | INTRAVENOUS | Status: AC
  Administered 2020-11-30: 500 mL via INTRAVENOUS

## 2020-11-30 MED ORDER — DEXTROSE 50 % IV SOLN
INTRAVENOUS | Status: AC
  Administered 2020-11-30: 50 mL via INTRAVENOUS
  Filled 2020-11-30: qty 50

## 2020-11-30 MED ORDER — ACETAMINOPHEN 650 MG RE SUPP: 650.0000 mg | Freq: Four times a day (QID) | RECTAL | Status: DC | PRN

## 2020-11-30 MED ORDER — MIDAZOLAM 50MG/50ML (1MG/ML) PREMIX INFUSION
0.0000 mg/h | INTRAVENOUS | Status: DC
  Administered 2020-11-30: 2 mg/h via INTRAVENOUS
  Filled 2020-11-30: qty 50

## 2020-11-30 MED ORDER — FENTANYL CITRATE (PF) 100 MCG/2ML IJ SOLN
25.0000 ug | INTRAMUSCULAR | Status: DC | PRN
  Administered 2020-12-01: 100 ug via INTRAVENOUS
  Filled 2020-11-30: qty 2

## 2020-11-30 MED ORDER — LACTATED RINGERS IV SOLN: INTRAVENOUS | Status: DC

## 2020-11-30 MED ORDER — MIDAZOLAM HCL 2 MG/2ML IJ SOLN: 1.0000 mg | INTRAMUSCULAR | Status: DC | PRN

## 2020-11-30 MED ORDER — METRONIDAZOLE IN NACL 5-0.79 MG/ML-% IV SOLN
500.0000 mg | Freq: Three times a day (TID) | INTRAVENOUS | Status: DC
  Administered 2020-11-30 – 2020-12-01 (×2): 500 mg via INTRAVENOUS
  Filled 2020-11-30 (×2): qty 100

## 2020-11-30 MED ORDER — VANCOMYCIN HCL 1500 MG/300ML IV SOLN
1500.0000 mg | Freq: Once | INTRAVENOUS | Status: AC
  Administered 2020-11-30: 1500 mg via INTRAVENOUS
  Filled 2020-11-30: qty 300

## 2020-11-30 MED ORDER — POTASSIUM CHLORIDE 10 MEQ/100ML IV SOLN
10.0000 meq | INTRAVENOUS | Status: AC
  Administered 2020-11-30 (×2): 10 meq via INTRAVENOUS
  Filled 2020-11-30 (×2): qty 100

## 2020-11-30 MED ORDER — POLYETHYLENE GLYCOL 3350 17 G PO PACK
17.0000 g | PACK | Freq: Every day | ORAL | Status: DC
  Administered 2020-12-01: 17 g
  Filled 2020-11-30: qty 1

## 2020-11-30 MED ORDER — VANCOMYCIN HCL IN DEXTROSE 1-5 GM/200ML-% IV SOLN
1000.0000 mg | Freq: Once | INTRAVENOUS | Status: DC
  Filled 2020-11-30: qty 200

## 2020-11-30 MED ORDER — ACETAMINOPHEN 325 MG PO TABS
650.0000 mg | ORAL_TABLET | Freq: Once | ORAL | Status: DC
  Filled 2020-11-30: qty 2

## 2020-11-30 MED ORDER — VANCOMYCIN HCL 1250 MG/250ML IV SOLN: 1250.0000 mg | INTRAVENOUS | Status: DC

## 2020-11-30 MED ORDER — DEXTROSE 50 % IV SOLN: 50.0000 mL | Freq: Once | INTRAVENOUS | Status: AC

## 2020-11-30 MED ORDER — ONDANSETRON HCL 4 MG/2ML IJ SOLN: 4.0000 mg | Freq: Four times a day (QID) | INTRAMUSCULAR | Status: DC | PRN

## 2020-11-30 MED ORDER — DEXTROSE IN LACTATED RINGERS 5 % IV SOLN: INTRAVENOUS | Status: DC

## 2020-11-30 MED ORDER — EPINEPHRINE PF 1 MG/ML IJ SOLN
INTRAMUSCULAR | Status: AC
  Filled 2020-11-30: qty 4

## 2020-11-30 MED ORDER — ORAL CARE MOUTH RINSE
15.0000 mL | OROMUCOSAL | Status: DC
  Administered 2020-12-01 (×7): 15 mL via OROMUCOSAL

## 2020-11-30 MED ORDER — NOREPINEPHRINE 4 MG/250ML-% IV SOLN
2.0000 ug/min | INTRAVENOUS | Status: DC
  Administered 2020-11-30: 2 ug/min via INTRAVENOUS
  Filled 2020-11-30: qty 250

## 2020-11-30 MED ORDER — ACETAMINOPHEN 325 MG PO TABS: 650.0000 mg | ORAL_TABLET | Freq: Four times a day (QID) | ORAL | Status: DC | PRN

## 2020-11-30 MED ORDER — EPINEPHRINE HCL 5 MG/250ML IV SOLN IN NS
0.5000 ug/min | INTRAVENOUS | Status: DC
  Filled 2020-11-30: qty 250

## 2020-11-30 MED ORDER — ACETAMINOPHEN 650 MG RE SUPP
650.0000 mg | Freq: Once | RECTAL | Status: AC
  Administered 2020-11-30: 650 mg via RECTAL
  Filled 2020-11-30: qty 1

## 2020-11-30 MED ORDER — SODIUM CHLORIDE 0.9 % IV SOLN: 250.0000 mL | INTRAVENOUS | Status: DC

## 2020-11-30 MED ORDER — NOREPINEPHRINE 4 MG/250ML-% IV SOLN
INTRAVENOUS | Status: AC
  Filled 2020-11-30: qty 250

## 2020-11-30 MED ORDER — NOREPINEPHRINE 4 MG/250ML-% IV SOLN
0.0000 ug/min | INTRAVENOUS | Status: DC
  Administered 2020-11-30: 35 ug/min via INTRAVENOUS

## 2020-11-30 MED ORDER — DEXTROSE 50 % IV SOLN
INTRAVENOUS | Status: AC
  Administered 2020-11-30: 1
  Filled 2020-11-30: qty 50

## 2020-11-30 MED ORDER — EPINEPHRINE 1 MG/10ML IJ SOSY
PREFILLED_SYRINGE | INTRAMUSCULAR | Status: AC | PRN
  Administered 2020-11-30: 1 mg via INTRAVENOUS

## 2020-11-30 MED ORDER — SUCCINYLCHOLINE CHLORIDE 20 MG/ML IJ SOLN
INTRAMUSCULAR | Status: AC | PRN
  Administered 2020-11-30: 100 mg via INTRAVENOUS

## 2020-11-30 MED ORDER — PANTOPRAZOLE SODIUM 40 MG IV SOLR
40.0000 mg | Freq: Every day | INTRAVENOUS | Status: DC
  Administered 2020-12-01: 40 mg via INTRAVENOUS
  Filled 2020-11-30: qty 40

## 2020-11-30 MED ORDER — SODIUM BICARBONATE 8.4 % IV SOLN: 100.0000 meq | Freq: Once | INTRAVENOUS | Status: AC

## 2020-11-30 MED ORDER — ALBUTEROL SULFATE (2.5 MG/3ML) 0.083% IN NEBU: 2.5000 mg | INHALATION_SOLUTION | RESPIRATORY_TRACT | Status: DC | PRN

## 2020-11-30 MED ORDER — DOCUSATE SODIUM 50 MG/5ML PO LIQD
100.0000 mg | Freq: Two times a day (BID) | ORAL | Status: DC
  Administered 2020-12-01: 100 mg
  Filled 2020-11-30: qty 10

## 2020-11-30 MED ORDER — ETOMIDATE 2 MG/ML IV SOLN
INTRAVENOUS | Status: AC | PRN
  Administered 2020-11-30: 20 mg via INTRAVENOUS

## 2020-11-30 MED ORDER — SODIUM CHLORIDE 0.9 % IV SOLN: Freq: Once | INTRAVENOUS | Status: AC

## 2020-11-30 MED ORDER — HYDROCORTISONE NA SUCCINATE PF 100 MG IJ SOLR
50.0000 mg | Freq: Three times a day (TID) | INTRAMUSCULAR | Status: DC
  Administered 2020-11-30 – 2020-12-01 (×2): 50 mg via INTRAVENOUS
  Filled 2020-11-30 (×2): qty 2

## 2020-11-30 MED ORDER — LACTATED RINGERS IV BOLUS (SEPSIS)
1000.0000 mL | Freq: Once | INTRAVENOUS | Status: AC
  Administered 2020-11-30: 1000 mL via INTRAVENOUS

## 2020-11-30 MED ORDER — SODIUM CHLORIDE 0.9 % IV SOLN
2.0000 g | Freq: Once | INTRAVENOUS | Status: AC
  Administered 2020-11-30: 2 g via INTRAVENOUS
  Filled 2020-11-30: qty 2

## 2020-11-30 MED ORDER — SODIUM BICARBONATE 8.4 % IV SOLN
INTRAVENOUS | Status: DC
  Filled 2020-11-30 (×3): qty 850

## 2020-11-30 MED ORDER — SODIUM CHLORIDE 0.9 % IV SOLN: 2.0000 g | INTRAVENOUS | Status: DC

## 2020-11-30 MED ORDER — INSULIN ASPART 100 UNIT/ML ~~LOC~~ SOLN: 0.0000 [IU] | Freq: Three times a day (TID) | SUBCUTANEOUS | Status: DC

## 2020-11-30 MED ORDER — FENTANYL CITRATE (PF) 100 MCG/2ML IJ SOLN: 25.0000 ug | INTRAMUSCULAR | Status: DC | PRN

## 2020-11-30 MED ORDER — EPINEPHRINE 1 MG/10ML IJ SOSY
PREFILLED_SYRINGE | INTRAMUSCULAR | Status: AC
  Filled 2020-11-30: qty 10

## 2020-11-30 MED ORDER — ONDANSETRON HCL 4 MG PO TABS: 4.0000 mg | ORAL_TABLET | Freq: Four times a day (QID) | ORAL | Status: DC | PRN

## 2020-11-30 NOTE — ED Notes (Signed)
Family at bedside. 

## 2020-11-30 NOTE — ED Notes (Signed)
Md made aware of pts BP. 87/48. Verbal order for bolus of normal saline given

## 2020-11-30 NOTE — ED Provider Notes (Signed)
Originally had discussion with hospitalist about central line intubation.  They wanted to come to give a chance for the fluids to work and see how patient did.  Patient actually got hypotensive after the fluid boluses.  And also became less responsive.  Also patient was intubated using etomidate 20 mg succinylcholine 100 mg.  Intubated on first pass.  Following intubation patient was set up to receive central line.  Patient got a left femoral line without any difficulty triple-lumen.  See procedure notes  Procedure Name: Intubation Date/Time: 11/11/2020 9:33 PM Performed by: Fredia Sorrow, MD Pre-anesthesia Checklist: Patient identified, Emergency Drugs available, Suction available, Patient being monitored and Timeout performed Oxygen Delivery Method: Ambu bag Preoxygenation: Pre-oxygenation with 100% oxygen Induction Type: Rapid sequence Ventilation: Mask ventilation without difficulty Laryngoscope Size: Glidescope and 3 Tube size: 7.5 mm Number of attempts: 1 Placement Confirmation: ETT inserted through vocal cords under direct vision,  CO2 detector and Breath sounds checked- equal and bilateral Secured at: 26 cm Tube secured with: ETT holder    .Central Line  Date/Time: 12/04/2020 9:34 PM Performed by: Fredia Sorrow, MD Authorized by: Fredia Sorrow, MD   Consent:    Consent obtained:  Emergent situation   Risks, benefits, and alternatives were discussed: not applicable     Risks discussed:  Incorrect placement, infection, bleeding, arterial puncture and nerve damage   Alternatives discussed:  Observation Universal protocol:    Procedure explained and questions answered to patient or proxy's satisfaction: yes     Relevant documents present and verified: yes     Test results available: yes     Imaging studies available: yes     Required blood products, implants, devices, and special equipment available: yes     Site/side marked: yes     Immediately prior to procedure, a  time out was called: yes     Patient identity confirmed:  Arm band Pre-procedure details:    Indication(s): central venous access     Hand hygiene: Hand hygiene performed prior to insertion     Sterile barrier technique: All elements of maximal sterile technique followed     Skin preparation:  Chlorhexidine   Skin preparation agent: Skin preparation agent completely dried prior to procedure   Sedation:    Sedation type:  Moderate sedation Anesthesia:    Anesthesia method:  Local infiltration   Local anesthetic:  Lidocaine 1% w/o epi Procedure details:    Location:  L femoral   Procedural supplies:  Triple lumen   Landmarks identified: yes     Ultrasound guidance: yes     Ultrasound guidance timing: prior to insertion     Sterile ultrasound techniques: Sterile gel and sterile probe covers were used     Number of attempts:  1   Successful placement: yes   Post-procedure details:    Post-procedure:  Dressing applied and line sutured   Assessment:  Blood return through all ports and free fluid flow   Procedure completion:  Tolerated well, no immediate complications  CRITICAL CARE Performed by: Fredia Sorrow Total critical care time: 95 minutes Critical care time was exclusive of separately billable procedures and treating other patients. Critical care was necessary to treat or prevent imminent or life-threatening deterioration. Critical care was time spent personally by me on the following activities: development of treatment plan with patient and/or surrogate as well as nursing, discussions with consultants, evaluation of patient's response to treatment, examination of patient, obtaining history from patient or surrogate, ordering and performing treatments and  interventions, ordering and review of laboratory studies, ordering and review of radiographic studies, pulse oximetry and re-evaluation of patient's condition.  Patient remained extremely unstable.  Patient was on 20 of  norepinephrine still hypotensive.  In order to intubate him he received 1 mg of epinephrine.  And also to do the central line to get better ultrasound view he also received 1 mg epinephrine which got his blood pressure up.  And were able to identify the [facility] artery.  And was able to easily cannulate the femoral vein in the left groin area.  Patient turned over to the hospitalist for admission to ICU here   Fredia Sorrow, MD 11/13/2020 2139

## 2020-11-30 NOTE — ED Notes (Signed)
Notified hospitalist for sedation

## 2020-11-30 NOTE — Progress Notes (Signed)
Transported patient to ICU without problems

## 2020-11-30 NOTE — ED Notes (Signed)
Pt pulled IV out. New IV started in right ac.

## 2020-11-30 NOTE — Progress Notes (Signed)
[facility] Progress Note Patient Name: Brian Mcmillan DOB: 01-Dec-1924 MRN: [id number]   Date of Service  11/12/2020  HPI/Events of Note  Multiple issues: 1. ABG on 60%/PRVC 28/TV 650/P 5 = 7.223/24.6/245. No real room to increase PRVC rate with pCO2 = 24.6 already and 2. Mg++ = 1.3 and Creatinine = 1.85.Last Blood glucose = 53.   eICU Interventions  Plan: 1. NaHCO3 100 meq IV now X 1.  2. D5 NaHCO3 IV infusion to run IV at 75 mL/hour. 3. Repeat ABG at 5 AM.      Intervention Category Major Interventions: Acid-Base disturbance - evaluation and management;Respiratory failure - evaluation and management;Electrolyte abnormality - evaluation and management  [name] 11/29/2020, 10:50 PM

## 2020-11-30 NOTE — ED Provider Notes (Addendum)
[facility] EMERGENCY DEPARTMENT Provider Note   CSN: [id number] Arrival date & time: 11/15/2020  1117     History Chief Complaint  Patient presents with  . Fall    Brian Mcmillan is a 87 y.o. male.  HPI   87 year old male with past medical history of HTN, HLD, CAD, CHF, COPD, CKD presents to the department by EMS after an unwitnessed fall in the bathroom.  Patient was reportedly found down this morning, he states that he fell around 5:30 AM when he tried to use the bathroom.  He is hard of hearing.  Patient sustained skin tears to his right arm.  Currently complains of right hip pain and a mild headache.  Unclear if there was loss of consciousness.  Patient states this was a mechanical fall and not syncope.  Has no chest pain or shortness of breath.  Past Medical History:  Diagnosis Date  . Abdominal mass 2008   chronic inflammatory mass of the mesentary Dr [name]  . 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 Anemia   . Anginal pain ([place])   . CAD (coronary artery disease)    cath 09/23/09 70% Dx1 (diffuse disease), 40% Cx, Large RCA 80-90% with heavy calcification / cath 06/2010 no change tiht RCA still best to treat medially with nosebleeds  . Chest pain    myoview 2006 no ischemia/ myoview 2009 no ischemia  . CHF (congestive heart failure) ([place])   . CKD (chronic kidney disease)   . COPD (chronic obstructive pulmonary disease) ([place])   . COVID-19   . Dyslipidemia   . GERD (gastroesophageal reflux disease)   . Gout   . History of bleeding ulcers    "had 13 back in the early 1980s" (09/28/2016)  . History of blood transfusion 09/28/2016  . Hypertension    no RAS  . Hypothyroidism   . Kidney mass 2008   radiofrequency ablation at [facility]  . Left ventricular dysfunction    myoview 2006 normal / myoview 2009 normal / EF 60% cath 09/2009, EF 60% cath 06/2010  . Memory impairment    memory decreasing 06/2010  . Myocardial infarction ([place]) <2016X 1; 2016   "@  [facility]; @ [facility]"  . Nosebleed "frequently since ~ 1966"   significant, can not take ASA  . Pneumonia 08/2016-09/2016  . Rash Sept 2011   rash on buttocks, hospitalized hydralazine stopped but then restarted w/o return of rash  . Renal cancer ([place])   . Skin cancer of scalp   . Systolic click    no mitral valve prolapse  . Type II diabetes mellitus [facility])     Patient Active Problem List   Diagnosis Date Noted  . GERD (gastroesophageal reflux disease) 07/24/2019  . Hoarseness 07/24/2019  . Goals of care, counseling/discussion   . Acute blood loss anemia 09/28/2016  . CAD (coronary artery disease)   . Pancytopenia ([place])   . Epistaxis, recurrent   . Hyperuricemia   . Acute myeloid leukemia in adult [facility])   . AKI (acute kidney injury) ([place])   . Troponin level elevated   . Leukocytosis   . Lobar pneumonia, unspecified organism ([place])   . Acute on chronic kidney failure ([place]) 09/04/2016  . Acute on chronic systolic CHF (congestive heart failure) ([place]) 09/04/2016  . Thrombocytopenia ([place]) 09/04/2016  . Cellulitis 09/04/2016  . Chronic combined systolic and diastolic CHF (congestive heart failure) ([place]) 09/04/2016  . Paroxysmal atrial fibrillation ([facility])   . Cricopharyngeal dysphagia 11/06/2015  .  Esophageal dysphagia 11/06/2015  . Carotid disease, bilateral ([place]) 12/05/2014  . TIA (transient ischemic attack) 12/04/2014  . MGUS (monoclonal gammopathy of unknown significance) 03/29/2011  . Renal cancer ([place]) 03/29/2011    Class: Diagnosis of  . Shortness of breath 12/24/2010  . EDEMA 11/17/2010  . HEADACHE 10/28/2009  . Coronary artery disease due to lipid rich plaque 09/25/2009  . NOSEBLEED 09/22/2009  . Hypothyroidism, acquired 06/29/2009  . DYSLIPIDEMIA 06/29/2009  . Essential hypertension 06/29/2009  . CHEST PAIN 06/29/2009    Past Surgical History:  Procedure Laterality Date  . BACK SURGERY    . BONE MARROW ASPIRATION     X2  . BONE MARROW BIOPSY     X2  . CARDIAC  CATHETERIZATION    . CATARACT EXTRACTION W/ INTRAOCULAR LENS  IMPLANT, BILATERAL Bilateral   . COLONOSCOPY N/A 03/07/2013   Procedure: COLONOSCOPY;  Surgeon: [name], MD;  Location: [facility] ENDO SUITE;  Service: Endoscopy;  Laterality: N/A;  830-moved to 51 Ann to notify pt  . COLONOSCOPY  01/2013  . LUMBAR DISC SURGERY     "L4-5"  . LYMPH NODE DISSECTION Right     abdomen  . LYMPH NODE DISSECTION Left    robotic laser @ [facility]  on left side of stomach  . MOHS SURGERY     scalp; "22 stitches around; 9 stitches down; went all the way to the skull"  . NASAL SEPTUM SURGERY    . RFA RIGHT KIDNEY    . TONSILLECTOMY  1933       Family History  Problem Relation Age of Onset  . Cancer Maternal Grandfather   . Coronary artery disease Other     Social History   Tobacco Use  . Smoking status: Former Smoker    Packs/day: 0.50    Years: 42.00    Pack years: 21.00    Types: Cigarettes    Quit date: 10/11/1980    Years since quitting: 40.1  . Smokeless tobacco: Never Used  Substance Use Topics  . Alcohol use: Not Currently    Alcohol/week: 0.0 standard drinks    Comment: 09/28/2016 "don't drink at all anymore; nothing in years; used to have a social drink"  . Drug use: No    Home Medications Prior to Admission medications   Medication Sig Start Date End Date Taking? Authorizing Provider  acetaminophen (TYLENOL) 500 MG tablet Take 500 mg by mouth every 6 (six) hours as needed for moderate pain.    [provider]  albuterol-ipratropium (COMBIVENT) 18-103 MCG/ACT inhaler Inhale 2-4 puffs into the lungs 2 (two) times daily as needed for wheezing.     [provider]  allopurinol (ZYLOPRIM) 100 MG tablet Take 1 tablet (100 mg total) by mouth 2 (two) times daily. Patient taking differently: Take 100 mg by mouth daily. 09/13/16   Annita Brod, MD  amLODipine (NORVASC) 5 MG tablet Take 1 tablet (5 mg total) by mouth daily. 08/02/20   Noemi Chapel, MD  carvedilol  (COREG) 3.125 MG tablet Take 1 tablet by mouth 2 (two) times daily with a meal.     [provider]  cephALEXin (KEFLEX) 500 MG capsule Take 1 capsule (500 mg total) by mouth 4 (four) times daily. 09/29/20   Milton Ferguson, MD  Cholecalciferol (VITAMIN D3) 5000 UNITS CAPS Take 5,000 Units by mouth daily.     [provider]  clotrimazole (LOTRIMIN) 1 % cream Apply topically daily. 04/15/20   [provider]  cyanocobalamin 1000  MCG tablet Take 2 tablets by mouth daily.  03/17/11   [provider]  famotidine (PEPCID) 20 MG tablet Take 1 tablet (20 mg total) by mouth at bedtime. 07/22/20   Rehman, Mechele Dawley, MD  furosemide (LASIX) 40 MG tablet Take 20 mg by mouth daily.     [provider]  hydroxyurea (HYDREA) 500 MG capsule Take 500 mg by mouth daily. At 4:30 pm 03/23/19   [provider]  levothyroxine (SYNTHROID, LEVOTHROID) 125 MCG tablet Take 125 mcg by mouth daily.    [provider]  metFORMIN (GLUCOPHAGE) 500 MG tablet Take 500 mg by mouth daily.    [provider]  montelukast (SINGULAIR) 10 MG tablet Take 10 mg by mouth at bedtime.    [provider]  nitroGLYCERIN (NITRODUR - DOSED IN MG/24 HR) 0.6 mg/hr patch Place 0.6 mg onto the skin daily.    [provider]  nitroGLYCERIN (NITROSTAT) 0.4 MG SL tablet Place 0.4 mg under the tongue every 5 (five) minutes as needed for chest pain.    [provider]  Omega-3 Fatty Acids (FISH OIL) 1000 MG CPDR Take 1,200 mg by mouth daily.     [provider]  pantoprazole (PROTONIX) 40 MG tablet Take 1 tablet (40 mg total) by mouth daily before breakfast. 07/22/20   Rehman, Mechele Dawley, MD  potassium chloride (KLOR-CON) 10 MEQ tablet Take 10 mEq by mouth daily. 10/22/19   [provider]  pravastatin (PRAVACHOL) 20 MG tablet Take 20 mg by mouth every evening.    [provider]  sacubitril-valsartan (ENTRESTO) 24-26 MG  03/06/20    [provider]  SSD 1 % cream Apply topically daily. 05/20/20   [provider]  tamsulosin (FLOMAX) 0.4 MG CAPS capsule Take 0.4 mg by mouth daily.    [provider]  tiotropium (SPIRIVA) 18 MCG inhalation capsule Place 18 mcg into inhaler and inhale daily.    [provider]    Allergies    Ranolazine, Simvastatin, Aspirin, Contrast media [iodinated diagnostic agents], Ibuprofen, and Ioxaglate  Review of Systems   Review of Systems  Constitutional: Negative for chills and fever.  HENT: Negative for congestion.   Eyes: Negative for visual disturbance.  Respiratory: Negative for shortness of breath.   Cardiovascular: Negative for chest pain.  Gastrointestinal: Negative for abdominal pain, diarrhea and vomiting.  Genitourinary: Negative for dysuria.  Musculoskeletal: Negative for back pain and neck pain.       + Right hip pain  Skin: Negative for rash.  Neurological: Positive for headaches. Negative for syncope.    Physical Exam Updated Vital Signs BP (!) 128/54 (BP Location: Left Arm)   Pulse (!) 110   Temp (!) 101.9 F (38.8 C) (Rectal)   Resp (!) 28   Ht 6' 2" (1.88 m)   Wt 74.3 kg   SpO2 100%   BMI 21.03 kg/m   Physical Exam Vitals and nursing note reviewed.  Constitutional:      Appearance: Normal appearance.  HENT:     Head: Normocephalic.     Nose:     Comments: Dried blood at the left naris, no nasal bridge deformity, no septal hematoma    Mouth/Throat:     Mouth: Mucous membranes are moist.  Eyes:     Pupils: Pupils are equal, round, and reactive to light.  Cardiovascular:     Rate and Rhythm: Normal rate.  Pulmonary:     Effort: Pulmonary effort is normal. No respiratory  distress.  Abdominal:     Palpations: Abdomen is soft.     Tenderness: There is no abdominal tenderness.  Musculoskeletal:     Cervical back: No rigidity or tenderness.     Comments: Pelvis is stable, right hip appears to be tender, skin tears to  the right forearm and right bicep, dressed by nursing staff, no obvious deformity or tenderness to any other bony prominence  Skin:    General: Skin is warm.  Neurological:     Mental Status: He is alert and oriented to person, place, and time. Mental status is at baseline.     Comments: Hard of hearing but appears oriented with appropriate answers  Psychiatric:        Mood and Affect: Mood normal.     ED Results / Procedures / Treatments   Labs (all labs ordered are listed, but only abnormal results are displayed) Labs Reviewed  CBC WITH DIFFERENTIAL/PLATELET - Abnormal; Notable for the following components:      Result Value   WBC 3.8 (*)    RBC 3.89 (*)    Hemoglobin 12.6 (*)    MCV 100.5 (*)    RDW 15.9 (*)    Platelets 64 (*)    nRBC 1.8 (*)    Lymphs Abs 0.3 (*)    Monocytes Absolute 0.0 (*)    Abs Immature Granulocytes 0.11 (*)    All other components within normal limits  RESP PANEL BY RT-PCR (FLU A&B, COVID) ARPGX2  COMPREHENSIVE METABOLIC PANEL  URINALYSIS, ROUTINE W REFLEX MICROSCOPIC    EKG EKG Interpretation  Date/Time:  _53  year old male initially presented to the emergency department for concern of mechanical fall and being found down, on the floor for possibly 3 hours.  Patient was initially alert and conversational.  CT imaging for the fall shows no acute finding in the head, face, cervical spine as well as chest/pelvis/hips.  Since being here patient has developed a fever, hypotension and confusion.  Son is here and reveals that the patient has leukemia, he is on some oral medications but not undergoing any chemo/radiation.  Blood work shows baseline kidney dysfunction and some other mild derangements, Covid is negative, urinalysis shows no infection.  Patient was given a rectal Tylenol.  He is now having evolving sepsis of unknown source.  He is higher risk in terms of infection/sepsis given his leukemia.  Patient's blood pressure has responded to fluid.  Blood cultures were drawn, lactic is elevated over 7, CT of the abdomen and pelvis shows possible enteritis but no other acute findings.  Will consult with critical care given the soft pressures and elevated lactic acid, plan for admission.  Patient will require admission, patient signed out to Dr. Venita Sheffield.  Final Clinical Impression(s) / ED Diagnoses Final diagnoses:  Fall    Rx / DC Orders ED Discharge Orders    None       Lorelle Gibbs, DO 11/22/2020 1521    Lorelle Gibbs, DO 12/05/2020 1541    Horton, Alvin Critchley, DO 12/08/2020 1631

## 2020-11-30 NOTE — ED Notes (Signed)
New CBG 146

## 2020-11-30 NOTE — Progress Notes (Signed)
Pharmacy Antibiotic Note  Brian Mcmillan is a 87 y.o. male admitted on 12/06/2020 with unknown source.  Pharmacy has been consulted for Vancomycin an cefepime dosing.  Plan: Vancomycin 1500 mg IV x 1 dose. Vancomycin 1250 mg IV every 48 hours.  Cefepime 2000 mg IV every 24 hours. Monitor labs, c/s, and vanco level as indicated.  Height: 6\' 2"  (188 cm) Weight: 74.3 kg (163 lb 12.8 oz) IBW/kg (Calculated) : 82.2  Temp (24hrs), Avg:101.6 F (38.7 C), Min:101.2 F (38.4 C), Max:101.9 F (38.8 C)  Recent Labs  Lab 11/20/2020 1220 12/06/2020 1417  WBC 3.8*  --   CREATININE 1.85*  --   LATICACIDVEN  --  7.8*    Estimated Creatinine Clearance: 26.2 mL/min (A) (by C-G formula based on SCr of 1.85 mg/dL (H)).    Allergies  Allergen Reactions  . Ranolazine Shortness Of Breath  . Simvastatin Other (See Comments)    fatigue  . Aspirin Other (See Comments)    Causes severe bleeding  . Contrast Media [Iodinated Diagnostic Agents] Other (See Comments)    Will shut kidneys down  . Ibuprofen Other (See Comments)    Nose bleeds  . Ioxaglate Other (See Comments)    Will shut kidneys down    Antimicrobials this admission: Vanco 2/20 >>  Cefepime 2/20 >> Flagyl 2/20  Microbiology results: 2/20 BCx: pending  Thank you for allowing pharmacy to be a part of this patient's care.  Ramond Craver 11/19/2020 3:33 PM

## 2020-11-30 NOTE — Progress Notes (Signed)
Lactic Acid greater than 11. MD notified.

## 2020-11-30 NOTE — ED Triage Notes (Signed)
Pt from home via [facility]. Pt was found down in his bathroom by family. Pt reports that he fell around 0520 this morning. Pt A&O x 4. Pt has skin tears to his right forearm and right bicep.

## 2020-11-30 NOTE — ED Notes (Signed)
MD made aware of pts cbg being 35. Amp of D50 ordered and given.

## 2020-11-30 NOTE — ED Notes (Signed)
Date and time results received: 11/24/2020 1530 (use smartphrase ".now" to insert current time)  Test: lactic  Critical Value: 7.5  Name of Provider Notified: Dr. Dina Rich  Orders Received? Or Actions Taken?: see chart

## 2020-11-30 NOTE — ED Notes (Signed)
Pt to xray at this time.

## 2020-11-30 NOTE — ED Notes (Signed)
pts pressures 75/42. MD made aware. Order given for 500 bolus.

## 2020-11-30 NOTE — ED Notes (Signed)
Lab at bedside

## 2020-11-30 NOTE — ED Notes (Signed)
MD at bedside. 

## 2020-11-30 NOTE — ED Notes (Signed)
Sips of water giving to patient. Pt choked and coughed. MD made aware. Pt will be NPO.

## 2020-11-30 NOTE — Progress Notes (Addendum)
[facility] Progress Note Patient Name: Brian Mcmillan DOB: 1925/05/05 MRN: [id number]   Date of Service  12/03/2020  HPI/Events of Note  Hypotenison - BP = 69/36 with MAP = 47. LVEF in 2017 = 45% to 50%. Lactic Acid = 10.6. Vasopressin IV infusion already added by hospitalist.   eICU Interventions  Plan:  1. Increase ceiling on Norepinephrine IV infusion to 60 mcg/min. 2. LR 1 liter IV over 1 hour now.  3. ABG STAT. 4. Cycle Troponin. 5. Continue to trend Lactic Acid.      Intervention Category Major Interventions: Hypotension - evaluation and management  Caliah Kopke Eugene 12/08/2020, 10:15 PM

## 2020-11-30 NOTE — Sepsis Progress Note (Signed)
Sepsis protocol is being followed by [facility]. 

## 2020-11-30 NOTE — Progress Notes (Signed)
Both sets of blood cultures positive for gram neg rods. Pt currently on cefepime vanc and flagyl

## 2020-11-30 NOTE — H&P (Addendum)
History and Physical    Brian Mcmillan:[id number] DOB: 10/31/1924 DOA: 11/27/2020  PCP: Redmond School, MD   Patient coming from: Home  I have personally briefly reviewed patient's old medical records in [place]  Chief Complaint: FALL  HPI: Brian Mcmillan is a 87 y.o. male with medical history significant for acute myeloid leukemia, renal insufficiency, systolic CHF, paroxysmal atrial fibrillation, CKD3.  Patient was brought to the Ed by EMS after patient was found down on the floor.  Most of the history is obtained from patient's son- Richardson Landry at bedside, as at the time of my evaluation patient is lethargic, answers a few questions by nodding or shaking his head.  Patient lives alone, has nurses/aides/family that come in and help patient with meals every day.  Patient has no memory problems and is otherwise " mentally sharp".  Son reports that patient has had no complaints over the past few days.  No reports of difficulty breathing, no cough,.  He has had diarrhea but this is not new, otherwise no abdominal symptoms.   Patient reported that he fell at about 6 AM in the morning, and was found by his son at about 47 PM.  Patient's son called his brother, to help patient up and called EMS.  Patient son reports mild confusion here in the ED, otherwise mental status was intact.  ED Course: Temperature 101.9, initial tachycardia to 134, tachypneic to 41, blood pressure systolic dropped to 81/84 improved currently 101 142.  O2 sats 94 200% on room air, was placed on 2 L nasal cannula due to tachypnea. Head, cervical and Maxillofacial CT, show soft tissue contusion above right eye otherwise without acute abnormalities.  Portable chest x-ray-enlargement of cardiac silhouette with bibasilar atelectasis.  Abdominal CT under distention versus mild enterocolitis, clinical correlation recommended. Lactic acid elevated at 7.8 >> 8.3.  WBC 3.8.  UA with rare bacteria.  Covid test negative.  CK  1108. Vanc, cefepime and metronidazole started.  Hospitalist to admit.  Review of Systems: As per HPI all other systems reviewed and negative.  Past Medical History:  Diagnosis Date  . Abdominal mass 2008   chronic inflammatory mass of the mesentary Dr [name]  . 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 Anemia   . Anginal pain ([place])   . CAD (coronary artery disease)    cath 09/23/09 70% Dx1 (diffuse disease), 40% Cx, Large RCA 80-90% with heavy calcification / cath 06/2010 no change tiht RCA still best to treat medially with nosebleeds  . Chest pain    myoview 2006 no ischemia/ myoview 2009 no ischemia  . CHF (congestive heart failure) ([place])   . CKD (chronic kidney disease)   . COPD (chronic obstructive pulmonary disease) ([place])   . COVID-19   . Dyslipidemia   . GERD (gastroesophageal reflux disease)   . Gout   . History of bleeding ulcers    "had 13 back in the early 1980s" (09/28/2016)  . History of blood transfusion 09/28/2016  . Hypertension    no RAS  . Hypothyroidism   . Kidney mass 2008   radiofrequency [facility]  . Left ventricular dysfunction    myoview 2006 normal / myoview 2009 normal / EF 60% cath 09/2009, EF 60% cath 06/2010  . Memory impairment    memory decreasing 06/2010  . Myocardial infarction ([place]) <2016X 1; 2016   "@ [facility]; @ [facility]"  . Nosebleed "frequently since ~ 1966"   significant, can not take  ASA  . Pneumonia 08/2016-09/2016  . Rash Sept 2011   rash on buttocks, hospitalized hydralazine stopped but then restarted w/o return of rash  . Renal cancer ([place])   . Skin cancer of scalp   . Systolic click    no mitral valve prolapse  . Type II diabetes mellitus ([place])     Past Surgical History:  Procedure Laterality Date  . BACK SURGERY    . BONE MARROW ASPIRATION     X2  . BONE MARROW BIOPSY     X2  . CARDIAC CATHETERIZATION    . CATARACT EXTRACTION W/ INTRAOCULAR LENS  IMPLANT, BILATERAL Bilateral   . COLONOSCOPY N/A  03/07/2013   Procedure: COLONOSCOPY;  Surgeon: [name], MD;  Location: [facility];  Service: Endoscopy;  Laterality: N/A;  830-moved to 21 Ann to notify pt  . COLONOSCOPY  01/2013  . LUMBAR DISC SURGERY     "L4-5"  . LYMPH NODE DISSECTION Right     abdomen  . LYMPH NODE DISSECTION Left    robotic laser @ [facility]  on left side of stomach  . MOHS SURGERY     scalp; "22 stitches around; 9 stitches down; went all the way to the skull"  . NASAL SEPTUM SURGERY    . RFA RIGHT KIDNEY    . TONSILLECTOMY  1933     reports that he quit smoking about 40 years ago. His smoking use included cigarettes. He has a 21.00 pack-year smoking history. He has never used smokeless tobacco. He reports previous alcohol use. He reports that he does not use drugs.  Allergies  Allergen Reactions  . Ranolazine Shortness Of Breath  . Simvastatin Other (See Comments)    fatigue  . Aspirin Other (See Comments)    Causes severe bleeding  . Contrast Media [Iodinated Diagnostic Agents] Other (See Comments)    Will shut kidneys down  . Ibuprofen Other (See Comments)    Nose bleeds  . Ioxaglate Other (See Comments)    Will shut kidneys down    Family History  Problem Relation Age of Onset  . Cancer Maternal Grandfather   . Coronary artery disease Other     Prior to Admission medications   Medication Sig Start Date End Date Taking? Authorizing Provider  acetaminophen (TYLENOL) 500 MG tablet Take 500 mg by mouth every 6 (six) hours as needed for moderate pain.   Yes [provider]  albuterol-ipratropium (COMBIVENT) 18-103 MCG/ACT inhaler Inhale 2-4 puffs into the lungs 2 (two) times daily as needed for wheezing.    Yes [provider]  allopurinol (ZYLOPRIM) 100 MG tablet Take 1 tablet (100 mg total) by mouth 2 (two) times daily. Patient taking differently: Take 100 mg by mouth daily. 09/13/16  Yes Annita Brod, MD  amLODipine (NORVASC) 5 MG tablet Take 1 tablet (5 mg total) by  mouth daily. 08/02/20  Yes Noemi Chapel, MD  carvedilol (COREG) 3.125 MG tablet Take 1 tablet by mouth 2 (two) times daily with a meal.    Yes [provider]  Cholecalciferol (VITAMIN D3) 5000 UNITS CAPS Take 5,000 Units by mouth daily.    Yes [provider]  clotrimazole (LOTRIMIN) 1 % cream Apply topically daily. 04/15/20  Yes [provider]  cyanocobalamin 1000 MCG tablet Take 2 tablets by mouth daily.  03/17/11  Yes [provider]  furosemide (LASIX) 40 MG tablet Take 20 mg by mouth daily.    Yes [provider]  hydroxyurea (  HYDREA) 500 MG capsule Take 500 mg by mouth daily. At 4:30 pm Monday-Friday 03/23/19  Yes [provider]  levothyroxine (SYNTHROID, LEVOTHROID) 125 MCG tablet Take 125 mcg by mouth daily.   Yes [provider]  metFORMIN (GLUCOPHAGE) 500 MG tablet Take 500 mg by mouth daily.   Yes [provider]  methylPREDNISolone (MEDROL DOSEPAK) 4 MG TBPK tablet Take 4 mg by mouth See admin instructions. 6,5,4,3,2,1 dose down 11/26/20 12/03/20 Yes [provider]  montelukast (SINGULAIR) 10 MG tablet Take 10 mg by mouth at bedtime.   Yes [provider]  nitroGLYCERIN (NITRODUR - DOSED IN MG/24 HR) 0.6 mg/hr patch Place 0.6 mg onto the skin daily.   Yes [provider]  nitroGLYCERIN (NITROSTAT) 0.4 MG SL tablet Place 0.4 mg under the tongue every 5 (five) minutes as needed for chest pain.   Yes [provider]  Omega-3 Fatty Acids (FISH OIL) 1000 MG CPDR Take 1,200 mg by mouth daily.    Yes [provider]  potassium chloride (KLOR-CON) 10 MEQ tablet Take 10 mEq by mouth daily. 10/22/19  Yes [provider]  pravastatin (PRAVACHOL) 20 MG tablet Take 20 mg by mouth every evening.   Yes [provider]  sacubitril-valsartan (ENTRESTO) 24-26 MG Take 1 tablet by mouth 2 (two) times daily. 03/06/20  Yes [provider]  SSD 1 % cream Apply topically  daily. 05/20/20  Yes [provider]  tamsulosin (FLOMAX) 0.4 MG CAPS capsule Take 0.4 mg by mouth daily.   Yes [provider]  cephALEXin (KEFLEX) 500 MG capsule Take 1 capsule (500 mg total) by mouth 4 (four) times daily. Patient not taking: No sig reported 09/29/20   Milton Ferguson, MD  famotidine (PEPCID) 20 MG tablet Take 1 tablet (20 mg total) by mouth at bedtime. Patient not taking: Reported on 11/25/2020 07/22/20   [name], MD  pantoprazole (PROTONIX) 40 MG tablet Take 1 tablet (40 mg total) by mouth daily before breakfast. Patient not taking: Reported on 11/25/2020 07/22/20   [name], MD  tiotropium (SPIRIVA) 18 MCG inhalation capsule Place 18 mcg into inhaler and inhale daily. Patient not taking: Reported on 11/26/2020    [provider]  zolpidem (AMBIEN) 10 MG tablet Take 1 tablet by mouth at bedtime as needed. Patient not taking: No sig reported 05/09/17   [provider]    Physical Exam: Vitals:   11/29/2020 1413 11/16/2020 1430 11/11/2020 1450 11/21/2020 1530  BP:  (!) 98/52 (!) 75/44 (!) 101/42  Pulse:  100 91 96  Resp:  (!) 28 (!) 26 (!) 32  Temp: (!) 101.2 F (38.4 C)     TempSrc: Rectal     SpO2:  96% 97% 99%  Weight:      Height:        Constitutional: Lethargic Vitals:   12/03/2020 1413 12/04/2020 1430 12/07/2020 1450 11/23/2020 1530  BP:  (!) 98/52 (!) 75/44 (!) 101/42  Pulse:  100 91 96  Resp:  (!) 28 (!) 26 (!) 32  Temp: (!) 101.2 F (38.4 C)     TempSrc: Rectal     SpO2:  96% 97% 99%  Weight:      Height:       Eyes: PERRL, lids and conjunctivae normal ENMT: Mucous membranes are very dry. Neck: normal, supple, no masses, no thyromegaly Respiratory: clear to auscultation bilaterally, no wheezing, no crackles.  Tachypneic, with use of abdominal accessory muscles.   Cardiovascular: Irregular  rate and rhythm, no murmurs / rubs / gallops. No extremity edema. 2+ pedal pulses. Abdomen: no tenderness, no masses  palpated. No hepatosplenomegaly. Bowel sounds positive.  Musculoskeletal: no clubbing / cyanosis. No joint deformity upper and lower extremities. Good ROM, no contractures. Normal muscle tone.  Skin: no rashes, lesions, ulcers. No induration Neurologic: Exam limited by patient's lethargy, no facial asymmetry, moving extremities spontaneously. Psychiatric: Lethargic, following most directions,  Labs on Admission: I have personally reviewed following labs and imaging studies  CBC: Recent Labs  Lab 11/22/2020 1220  WBC 3.8*  NEUTROABS 3.4  HGB 12.6*  HCT 39.1  MCV 100.5*  PLT 64*   Basic Metabolic Panel: Recent Labs  Lab 11/14/2020 1220  NA 142  K 3.3*  CL 110  CO2 22  GLUCOSE 74  BUN 34*  CREATININE 1.85*  CALCIUM 9.5   Liver Function Tests: Recent Labs  Lab 11/13/2020 1220  AST 76*  ALT 32  ALKPHOS 108  BILITOT 2.0*  PROT 6.4*  ALBUMIN 2.7*   Urine analysis:    Component Value Date/Time   COLORURINE YELLOW 11/25/2020 1204   APPEARANCEUR CLEAR 11/26/2020 1204   LABSPEC 1.014 12/05/2020 1204   PHURINE 5.0 11/18/2020 1204   GLUCOSEU 50 (A) 11/20/2020 1204   HGBUR MODERATE (A) 11/26/2020 [place] 11/15/2020 1204   BILIRUBINUR negative 09/29/2020 [place] 11/13/2020 1204   PROTEINUR 30 (A) 11/27/2020 1204   UROBILINOGEN 0.2 09/29/2020 1753   UROBILINOGEN 0.2 12/04/2014 2042   NITRITE NEGATIVE 11/19/2020 1204   LEUKOCYTESUR NEGATIVE 11/26/2020 1204    Radiological Exams on Admission: CT Abdomen Pelvis Wo Contrast  Result Date: 11/26/2020 CLINICAL DATA:  87 year old male with abdominal pain. EXAM: CT ABDOMEN AND PELVIS WITHOUT CONTRAST TECHNIQUE: Multidetector CT imaging of the abdomen and pelvis was performed following the standard protocol without IV contrast. COMPARISON:  CT abdomen pelvis dated 11/21/2008. FINDINGS: Evaluation of this exam is limited in the absence of intravenous contrast. Evaluation is also limited due to  streak artifact caused by patient's arms. Lower chest: Diffuse interstitial coarsening. Minimal bibasilar atelectasis. There is cardiomegaly and coronary vascular calcification. No intra-abdominal free air.  Trace free fluid in the pelvis. Hepatobiliary: Indeterminate 1 cm hypodense lesion in the left lobe of liver most likely a cyst. This was present on the prior CT. The liver is otherwise unremarkable. No intrahepatic biliary dilatation. No calcified gallstone or pericholecystic fluid. Pancreas: Scattered calcifications, likely sequela of prior pancreatitis. No active inflammatory changes. No peripancreatic fluid collection. Spleen: Normal in size without focal abnormality. Adrenals/Urinary Tract: The adrenal glands unremarkable. Dystrophic calcification of the previously seen left renal upper pole mass status post prior RFA. There is a 3 mm nonobstructing right renal upper pole calculus. There is no hydronephrosis or obstructing stone on either side. The visualized ureters appear unremarkable. The urinary bladder is predominantly collapsed. Stomach/Bowel: Mild thickened appearance of small-bowel loops may be related to underdistention but concerning for mild enteritis. Clinical correlation is recommended. Several scattered colonic diverticula without active inflammatory changes. There is also diffuse thickened appearance of the colon which may be related to under distension or represent mild colitis. There is no bowel obstruction. The appendix is normal. Vascular/Lymphatic: Advanced aortoiliac atherosclerotic disease. The IVC is unremarkable. No portal venous gas. There is no adenopathy. Reproductive: Interval decrease in the size of the soft tissue component of the mesenteric lesion with coarse calcification compared to the prior CT. Other: Mild subcutaneous edema. Musculoskeletal: Osteopenia with degenerative  changes of the spine. No acute osseous pathology. IMPRESSION: 1. Underdistention versus mild  enterocolitis. Clinical correlation is recommended. No bowel obstruction. Normal appendix. 2. Colonic diverticulosis. 3. Additional nonacute findings as above. 4. Aortic Atherosclerosis (ICD10-I70.0). Electronically Signed   By: Anner Crete M.D.   On: 12/08/2020 15:29   DG Chest 1 View  Result Date: 11/24/2020 CLINICAL DATA:  Found down in bathroom by family, fell around 0520 hours this morning, history coronary artery disease, CHF, COPD, COVID-19, hypertension, MI, renal cancer EXAM: CHEST  1 VIEW COMPARISON:  10/09/2020 FINDINGS: Enlargement of cardiac silhouette. Mediastinal contours and pulmonary vascularity normal. Atherosclerotic calcification aorta. Mild elevation of RIGHT diaphragm with mild bibasilar atelectasis. Minimal RIGHT upper lobe scarring. Lungs otherwise clear. No acute infiltrate, pleural effusion, or pneumothorax. IMPRESSION: Enlargement of cardiac silhouette with bibasilar atelectasis. Aortic Atherosclerosis (ICD10-I70.0). Electronically Signed   By: Lavonia Dana M.D.   On: 11/26/2020 13:52   CT Head Wo Contrast  Result Date: 11/13/2020 CLINICAL DATA:  Patient found down this morning after a fall. Initial encounter. EXAM: CT HEAD WITHOUT CONTRAST CT MAXILLOFACIAL WITHOUT CONTRAST CT CERVICAL SPINE WITHOUT CONTRAST TECHNIQUE: Multidetector CT imaging of the head, cervical spine, and maxillofacial structures were performed using the standard protocol without intravenous contrast. Multiplanar CT image reconstructions of the cervical spine and maxillofacial structures were also generated. COMPARISON:  Head CT scan 12/04/2014 and 08/01/2020. FINDINGS: CT HEAD FINDINGS Brain: No evidence of acute infarction, hemorrhage, hydrocephalus, extra-axial collection or mass lesion/mass effect. Vascular: No hyperdense vessel or unexpected calcification. Skull: Intact.  No focal lesion. Other: Soft tissue contusion is seen above the right eye. CT MAXILLOFACIAL FINDINGS Osseous: No fracture or  mandibular dislocation. No destructive process. Orbits: Status post cataract surgery. Orbital fat is clear. Globes are intact. No fracture. Sinuses: Minimal mucosal thickening left ethmoid air cells. Otherwise clear. Soft tissues: Negative. CT CERVICAL SPINE FINDINGS Alignment: Maintained. Skull base and vertebrae: No acute fracture. No primary bone lesion or focal pathologic process. Soft tissues and spinal canal: No prevertebral fluid or swelling. No visible canal hematoma. Disc levels: Intervertebral disc space height is maintained. Multilevel facet arthropathy noted. Upper chest: Clear. Other: None. IMPRESSION: Soft tissue contusion above the right eye. No other acute abnormality head, face or cervical spine. Electronically Signed   By: Inge Rise M.D.   On: 11/14/2020 13:26   CT Cervical Spine Wo Contrast  Result Date: 11/19/2020 CLINICAL DATA:  Patient found down this morning after a fall. Initial encounter. EXAM: CT HEAD WITHOUT CONTRAST CT MAXILLOFACIAL WITHOUT CONTRAST CT CERVICAL SPINE WITHOUT CONTRAST TECHNIQUE: Multidetector CT imaging of the head, cervical spine, and maxillofacial structures were performed using the standard protocol without intravenous contrast. Multiplanar CT image reconstructions of the cervical spine and maxillofacial structures were also generated. COMPARISON:  Head CT scan 12/04/2014 and 08/01/2020. FINDINGS: CT HEAD FINDINGS Brain: No evidence of acute infarction, hemorrhage, hydrocephalus, extra-axial collection or mass lesion/mass effect. Vascular: No hyperdense vessel or unexpected calcification. Skull: Intact.  No focal lesion. Other: Soft tissue contusion is seen above the right eye. CT MAXILLOFACIAL FINDINGS Osseous: No fracture or mandibular dislocation. No destructive process. Orbits: Status post cataract surgery. Orbital fat is clear. Globes are intact. No fracture. Sinuses: Minimal mucosal thickening left ethmoid air cells. Otherwise clear. Soft tissues:  Negative. CT CERVICAL SPINE FINDINGS Alignment: Maintained. Skull base and vertebrae: No acute fracture. No primary bone lesion or focal pathologic process. Soft tissues and spinal canal: No prevertebral fluid or swelling. No visible canal hematoma. Disc  levels: Intervertebral disc space height is maintained. Multilevel facet arthropathy noted. Upper chest: Clear. Other: None. IMPRESSION: Soft tissue contusion above the right eye. No other acute abnormality head, face or cervical spine. Electronically Signed   By: Inge Rise M.D.   On: 11/25/2020 13:26   DG Hips Bilat W or Wo Pelvis 2 Views  Result Date: 11/20/2020 CLINICAL DATA:  Found down in bathroom by family EXAM: DG HIP (WITH OR WITHOUT PELVIS) 2V BILAT COMPARISON:  09/03/2016 FINDINGS: Osseous demineralization. Hip and SI joint spaces preserved. No fracture, dislocation, or bone destruction. Degenerative disc and facet disease changes at visualized lower lumbar spine. IMPRESSION: No acute pelvic abnormalities. Electronically Signed   By: Lavonia Dana M.D.   On: 11/29/2020 13:55   CT Maxillofacial WO CM  Result Date: 11/25/2020 CLINICAL DATA:  Patient found down this morning after a fall. Initial encounter. EXAM: CT HEAD WITHOUT CONTRAST CT MAXILLOFACIAL WITHOUT CONTRAST CT CERVICAL SPINE WITHOUT CONTRAST TECHNIQUE: Multidetector CT imaging of the head, cervical spine, and maxillofacial structures were performed using the standard protocol without intravenous contrast. Multiplanar CT image reconstructions of the cervical spine and maxillofacial structures were also generated. COMPARISON:  Head CT scan 12/04/2014 and 08/01/2020. FINDINGS: CT HEAD FINDINGS Brain: No evidence of acute infarction, hemorrhage, hydrocephalus, extra-axial collection or mass lesion/mass effect. Vascular: No hyperdense vessel or unexpected calcification. Skull: Intact.  No focal lesion. Other: Soft tissue contusion is seen above the right eye. CT MAXILLOFACIAL FINDINGS  Osseous: No fracture or mandibular dislocation. No destructive process. Orbits: Status post cataract surgery. Orbital fat is clear. Globes are intact. No fracture. Sinuses: Minimal mucosal thickening left ethmoid air cells. Otherwise clear. Soft tissues: Negative. CT CERVICAL SPINE FINDINGS Alignment: Maintained. Skull base and vertebrae: No acute fracture. No primary bone lesion or focal pathologic process. Soft tissues and spinal canal: No prevertebral fluid or swelling. No visible canal hematoma. Disc levels: Intervertebral disc space height is maintained. Multilevel facet arthropathy noted. Upper chest: Clear. Other: None. IMPRESSION: Soft tissue contusion above the right eye. No other acute abnormality head, face or cervical spine. Electronically Signed   By: Inge Rise M.D.   On: 12/08/2020 13:26    EKG: Independently reviewed.  Atrial fibrillation rate 119.  Incomplete left bundle branch block.  Assessment/Plan Principal Problem:   Severe sepsis with septic shock (HCC) Active Problems:   Essential hypertension   Thrombocytopenia (HCC)   Chronic combined systolic and diastolic CHF (congestive heart failure) (HCC)   Paroxysmal atrial fibrillation (HCC)   Acute myeloid leukemia in adult [facility])    Septic shock -febrile to 101.9, tachypneic to 41, hypotensive down to 75/44 improved with IV fluids, and with evidence of fluid overload dysfunction, lactic acidosis 7.8 > 8.3 and lactic acidosis.  At this time source of infection not identified.  Abdominal CT shows underdistention versus mild enterocolitis.  But abdominal exam is benign.  UA with rare bacteria.  Lower Lung exam on CT shows diffuse interstitial coarsening.  With unremarkable portable chest x-ray.  Neck Supple.  -ABG shows metabolic acidosis with pH of 7.2, PCO2 of 38, PO2 of 58. -Continue broad-spectrum antibiotics IV vancomycin cefepime and metronidazole -Follow-up blood and urine cultures -3.5 L bolus given, continue D5 RL  100cc/hr  -Trend lactic acid - EDP talked to critical care, admit here, recommended discussing goals of care with family.   - With persistent hypotension,, patient has been started on Levophed- maxed out, and vasopressin added.  Femoral central line placed by ED provider.  Patient is severely ill, prognosis is poor. - Stress dose steroids  Acute respiratory failure -now endotracheally intubated. Rapid decline in the ED, with change in mental status, patient now obtunded and unable to protect his airway, O2 sats 81% on 2 L N/c.  Likely due to severe sepsis and septic shock.  Portable x-ray and repeat post intubation-without significant findings to explain respiratory failure. -Repeat ABG -Portable chest x-ray in the morning -PRN IV Versed and fentanyl -Please consult critical care in the morning  Metabolic encephalopathy, Fall-with initial confusion > lethargic then obtunded, likely  2/2 SIRS and as yet unidentified source of infection.  Head CT without acute abnormality -N.p.o. for now, reported coughing and choking in ED while drinking sips of water. -Pending clinical course consider repeating chest imaging in a.m.  Nontraumatic rhabdomyolysis- 2/2 immobility.  CK 1108 -Trend CK -IV fluids  Hypokalemia-potassium 3.3 -Check magnesium - Replete  Acute myeloid leukemia, thrombocytopenia-platelets today 64, consistent with prior.  At [facility], per notes 11/26/20- concern that patient's AML is progressing, dose of hydroxyurea was increased and patient was started on methylprednisone Dosepak with taper. - 2017 patient was made DNR with hospice care, due to profound pancytopenia 2/2 AML. -N.p.o. for now, resume hydroxyurea when able - Hold Home oral methylprednisone - With persistent hypotension will start stress dose steroids- IV hydrocortisone  Combined systolic and diastolic CHF-stable and compensated.  Last echo 2017 shows EF of 45 to 50% with grade 2 diastolic dysfunction. -Hold  Lasix -Entresto held for now with hypotension, resume when able  CKD 3B- stable with creatinine 1.84.   Paroxysmal atrial fibrillation-currently in atrial fibrillation. Rates controlled, not on anticoagulation. -Hold carvedilol for now with hypotension resume when able  Hypertension-hypotensive down to 70s. -Hold Norvasc, carvedilol,  Hypothyroidism -Hold Synthroid for now while n.p.o.  DM-random glucose 74> 34.  - D50  - Hold home Metformin -D5 containing fluids while n.p.o. - HgbA1c - SSI- S  Q8H.   CRITICAL CARE Performed by: Bethena Roys   Total critical care time: 70 minutes  Critical care time was exclusive of separately billable procedures and treating other patients.  Critical care was necessary to treat or prevent imminent or life-threatening deterioration.  Critical care was time spent personally by me on the following activities: development of treatment plan with patient and/or surrogate as well as nursing, discussions with consultants, evaluation of patient's response to treatment, examination of patient, obtaining history from patient or surrogate, ordering and performing treatments and interventions, ordering and review of laboratory studies, ordering and review of radiographic studies, pulse oximetry and re-evaluation of patient's condition.   DVT prophylaxis:  SCDS Code Status: Full Code, discussed with patient both sons Richardson Landry initially and [place] later at bedside.  Previously DNR in the past, but this was rescinded. Family Communication: Patient son Richardson Landry and Gerald Stabs at bedside, he and his brother both are designated healthcare power of attorney's for patient.  I have discussed poor prognosis, severity of illness and that patient may not make it through the night- with both sons, they wish to proceed with full scope of care. Disposition Plan:  > 2 days Consults called:  None Admission status:  Inpt, ICU. I certify that at the point of admission it is my  clinical judgment that the patient will require inpatient hospital care spanning beyond 2 midnights from the point of admission due to high intensity of service, high risk for further deterioration and high frequency of surveillance required.    Mesa Janus E Georges Victorio  MD [facility]  12/08/2020, 10:21 PM

## 2020-11-30 NOTE — Progress Notes (Signed)
Critical Trop called by lab of 4,234. MD notified at this time.

## 2020-12-01 ENCOUNTER — Inpatient Hospital Stay (HOSPITAL_COMMUNITY): Payer: No Typology Code available for payment source

## 2020-12-01 DIAGNOSIS — N179 Acute kidney failure, unspecified: Secondary | ICD-10-CM | POA: Diagnosis not present

## 2020-12-01 DIAGNOSIS — A419 Sepsis, unspecified organism: Secondary | ICD-10-CM

## 2020-12-01 DIAGNOSIS — I2510 Atherosclerotic heart disease of native coronary artery without angina pectoris: Secondary | ICD-10-CM

## 2020-12-01 DIAGNOSIS — C9200 Acute myeloblastic leukemia, not having achieved remission: Secondary | ICD-10-CM

## 2020-12-01 DIAGNOSIS — I2583 Coronary atherosclerosis due to lipid rich plaque: Secondary | ICD-10-CM

## 2020-12-01 DIAGNOSIS — J9601 Acute respiratory failure with hypoxia: Secondary | ICD-10-CM

## 2020-12-01 DIAGNOSIS — R7881 Bacteremia: Secondary | ICD-10-CM

## 2020-12-01 DIAGNOSIS — R6521 Severe sepsis with septic shock: Secondary | ICD-10-CM

## 2020-12-01 DIAGNOSIS — I5042 Chronic combined systolic (congestive) and diastolic (congestive) heart failure: Secondary | ICD-10-CM

## 2020-12-01 DIAGNOSIS — Z7189 Other specified counseling: Secondary | ICD-10-CM

## 2020-12-01 DIAGNOSIS — N1832 Chronic kidney disease, stage 3b: Secondary | ICD-10-CM

## 2020-12-01 DIAGNOSIS — Z515 Encounter for palliative care: Secondary | ICD-10-CM | POA: Diagnosis not present

## 2020-12-01 DIAGNOSIS — E785 Hyperlipidemia, unspecified: Secondary | ICD-10-CM

## 2020-12-01 DIAGNOSIS — I10 Essential (primary) hypertension: Secondary | ICD-10-CM

## 2020-12-01 DIAGNOSIS — K219 Gastro-esophageal reflux disease without esophagitis: Secondary | ICD-10-CM

## 2020-12-01 DIAGNOSIS — I214 Non-ST elevation (NSTEMI) myocardial infarction: Secondary | ICD-10-CM

## 2020-12-01 DIAGNOSIS — E872 Acidosis, unspecified: Secondary | ICD-10-CM | POA: Diagnosis present

## 2020-12-01 DIAGNOSIS — I480 Paroxysmal atrial fibrillation: Secondary | ICD-10-CM

## 2020-12-01 DIAGNOSIS — Z978 Presence of other specified devices: Secondary | ICD-10-CM

## 2020-12-01 DIAGNOSIS — B9620 Unspecified Escherichia coli [E. coli] as the cause of diseases classified elsewhere: Secondary | ICD-10-CM | POA: Diagnosis present

## 2020-12-01 DIAGNOSIS — E039 Hypothyroidism, unspecified: Secondary | ICD-10-CM

## 2020-12-01 DIAGNOSIS — D696 Thrombocytopenia, unspecified: Secondary | ICD-10-CM

## 2020-12-01 LAB — BLOOD CULTURE ID PANEL (REFLEXED) - BCID2

## 2020-12-01 LAB — BASIC METABOLIC PANEL
Anion gap: 19 — ABNORMAL HIGH (ref 5–15)
BUN: 35 mg/dL — ABNORMAL HIGH (ref 8–23)
CO2: 15 mmol/L — ABNORMAL LOW (ref 22–32)
Calcium: 8.2 mg/dL — ABNORMAL LOW (ref 8.9–10.3)
Chloride: 105 mmol/L (ref 98–111)
Creatinine, Ser: 3.01 mg/dL — ABNORMAL HIGH (ref 0.61–1.24)
GFR, Estimated: 19 mL/min — ABNORMAL LOW (ref >60)
Glucose, Bld: 326 mg/dL — ABNORMAL HIGH (ref 70–99)
Potassium: 4.3 mmol/L (ref 3.5–5.1)
Sodium: 139 mmol/L (ref 135–145)

## 2020-12-01 LAB — BLOOD GAS, ARTERIAL
Acid-base deficit: 17.5 mmol/L — ABNORMAL HIGH (ref 0.0–2.0)
Bicarbonate: 11.6 mmol/L — ABNORMAL LOW (ref 20.0–28.0)
FIO2: 70
O2 Saturation: 99.2 %
Patient temperature: 36.6
pCO2 arterial: 19.7 mmHg — CL (ref 32.0–48.0)
pH, Arterial: 7.255 — ABNORMAL LOW (ref 7.350–7.450)
pO2, Arterial: 317 mmHg — ABNORMAL HIGH (ref 83.0–108.0)

## 2020-12-01 LAB — HEMOGLOBIN A1C
Hgb A1c MFr Bld: 6.4 % — ABNORMAL HIGH (ref 4.8–5.6)
Hgb A1c MFr Bld: 6.4 % — ABNORMAL HIGH (ref 4.8–5.6)
Mean Plasma Glucose: 136.98 mg/dL
Mean Plasma Glucose: 136.98 mg/dL

## 2020-12-01 LAB — CBC
HCT: 33.9 % — ABNORMAL LOW (ref 39.0–52.0)
Hemoglobin: 10.2 g/dL — ABNORMAL LOW (ref 13.0–17.0)
MCH: 32.3 pg (ref 26.0–34.0)
MCHC: 30.1 g/dL (ref 30.0–36.0)
MCV: 107.3 fL — ABNORMAL HIGH (ref 80.0–100.0)
Platelets: 33 10*3/uL — ABNORMAL LOW (ref 150–400)
RBC: 3.16 MIL/uL — ABNORMAL LOW (ref 4.22–5.81)
RDW: 16.6 % — ABNORMAL HIGH (ref 11.5–15.5)
WBC: 17.7 10*3/uL — ABNORMAL HIGH (ref 4.0–10.5)
nRBC: 1.4 % — ABNORMAL HIGH (ref 0.0–0.2)

## 2020-12-01 LAB — TROPONIN I (HIGH SENSITIVITY)
Troponin I (High Sensitivity): 10305 ng/L (ref <18)
Troponin I (High Sensitivity): 18526 ng/L (ref <18)
Troponin I (High Sensitivity): 25099 ng/L (ref <18)

## 2020-12-01 LAB — MRSA PCR SCREENING: MRSA by PCR: NEGATIVE

## 2020-12-01 LAB — LACTIC ACID, PLASMA: Lactic Acid, Venous: >11 mmol/L (ref 0.5–1.9)

## 2020-12-01 LAB — GLUCOSE, CAPILLARY: Glucose-Capillary: 108 mg/dL — ABNORMAL HIGH (ref 70–99)

## 2020-12-01 MED ORDER — DIPHENHYDRAMINE HCL 50 MG/ML IJ SOLN: 25.0000 mg | INTRAMUSCULAR | Status: DC | PRN

## 2020-12-01 MED ORDER — GLYCOPYRROLATE 0.2 MG/ML IJ SOLN
0.2000 mg | INTRAMUSCULAR | Status: DC | PRN
  Administered 2020-12-01: 0.2 mg via INTRAVENOUS
  Filled 2020-12-01: qty 1

## 2020-12-01 MED ORDER — HEPARIN (PORCINE) 25000 UT/250ML-% IV SOLN
850.0000 [IU]/h | INTRAVENOUS | Status: DC
  Administered 2020-12-01: 850 [IU]/h via INTRAVENOUS
  Filled 2020-12-01: qty 250

## 2020-12-01 MED ORDER — HEPARIN BOLUS VIA INFUSION
2000.0000 [IU] | Freq: Once | INTRAVENOUS | Status: AC
  Administered 2020-12-01: 2000 [IU] via INTRAVENOUS
  Filled 2020-12-01: qty 2000

## 2020-12-01 MED ORDER — SODIUM CHLORIDE 0.9 % IV SOLN: 2.0000 g | INTRAVENOUS | Status: DC

## 2020-12-01 MED ORDER — POLYVINYL ALCOHOL 1.4 % OP SOLN: 1.0000 [drp] | Freq: Four times a day (QID) | OPHTHALMIC | Status: DC | PRN

## 2020-12-01 MED ORDER — SODIUM CHLORIDE 0.9 % IV SOLN
0.5000 mg/h | INTRAVENOUS | Status: DC
  Administered 2020-12-01: 0.5 mg/h via INTRAVENOUS
  Filled 2020-12-01: qty 5

## 2020-12-01 MED ORDER — GLYCOPYRROLATE 1 MG PO TABS: 1.0000 mg | ORAL_TABLET | ORAL | Status: DC | PRN

## 2020-12-01 MED ORDER — HYDROCORTISONE NA SUCCINATE PF 100 MG IJ SOLR: 100.0000 mg | Freq: Three times a day (TID) | INTRAMUSCULAR | Status: DC

## 2020-12-01 MED ORDER — HYDROMORPHONE BOLUS VIA INFUSION
0.5000 mg | INTRAVENOUS | Status: DC | PRN
  Administered 2020-12-01 (×2): 0.5 mg via INTRAVENOUS
  Filled 2020-12-01: qty 1

## 2020-12-01 MED ORDER — MIDAZOLAM HCL 2 MG/2ML IJ SOLN
2.0000 mg | INTRAMUSCULAR | Status: DC | PRN
  Filled 2020-12-01: qty 2

## 2020-12-01 MED ORDER — CHLORHEXIDINE GLUCONATE CLOTH 2 % EX PADS
6.0000 | MEDICATED_PAD | Freq: Every day | CUTANEOUS | Status: DC
  Administered 2020-12-01: 6 via TOPICAL

## 2020-12-01 MED ORDER — GLYCOPYRROLATE 0.2 MG/ML IJ SOLN: 0.2000 mg | INTRAMUSCULAR | Status: DC | PRN

## 2020-12-02 LAB — URINE CULTURE: Culture: NO GROWTH

## 2020-12-03 LAB — CULTURE, BLOOD (ROUTINE X 2)
Special Requests: ADEQUATE
Special Requests: ADEQUATE

## 2020-12-09 NOTE — Progress Notes (Signed)
Critical trop of 18,526 and pCO2 of 19.7 from ABG. MD notified.

## 2020-12-09 NOTE — Consult Note (Addendum)
Consultation Note Date: 12-14-2020   Patient Name: Brian Mcmillan  DOB: 05/04/1925  MRN: [id number]  Age / Sex: 87 y.o., male  PCP: Redmond School, MD Referring Physician: Barton Dubois, MD  Reason for Consultation: Establishing goals of care and Terminal Care  HPI/Patient Profile: 87 y.o. 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 fib admitted on 11/22/2020 after being found down in his home. Workup reveals septal infarct with troponin up to 25, 1997/12/19 today. Lactic acidosis greater than 11. Blood culture positive for E. Coli. Admitted for sepsis- requiring intubation and multiple IV pressor support. Palliative medicine consulted for [place].     Clinical Assessment and Goals of Care:  I have reviewed medical records including EPIC notes, labs and imaging, received report from [place], Therapist, sports, examined the patient and met at bedside with his two sons Richardson Landry and Gerald Stabs  to discuss diagnosis prognosis, Evan, EOL wishes, disposition and options.  We discussed a brief life review of the patient. Prior to admission he was living in his home independently with intermittent care giver support. He used to enjoy playing golf. His spouse died Dec 19, 2022 a year ago.   His son notes that he has been critically ill before and three years ago they would told he would not survive until Christmas. However, he has had a good quality of life over the last three years.   We discussed her current illness and what it means in the larger context of her on-going co-morbidities.  Natural disease trajectory and expectations at EOL were discussed. Patient expressed to family that one of his greatest fears was that he would die at home alone. He had prepared family for his death, having all of his arrangements made. He shared with family this past Friday that he was ready to go on his "trip"  which is how he referred to the fact that he is ready to die.  The difference between aggressive medical intervention and comfort care was considered in light of the patient's goals of care.   Advanced directives, concepts specific to code status, artifical feeding and hydration, and rehospitalization were considered and discussed.   Hospice and Palliative Care services outpatient were explained and offered.  Questions and concerns were addressed.  The family was encouraged to call with questions or concerns.   Primary Decision Maker NEXT OF KIN- patient's sons    SUMMARY OF RECOMMENDATIONS -Family would like to speak with Dr. Dyann Kief before making final decisions but are indicating they would prefer extubation and comfort- patient would not wish to continue on in his current state if it is not likely that he will have meaningful recovery -All agree to DNR -Will life visitation restrictions to allow family to visit for end of life  Addendum- Dr. Dyann Kief and Dennis Bast met with patient's sons- they have agreed to eventual transition to full comfort measures only after family visits    2nd Addendum- Family present.  Remained at bedside as patient was  extubated to room air.  Comfort maintained with minimal bolus dosing of opioid and pre-extubation dose of IV robinul.   Code Status/Advance Care Planning:  DNR  Prognosis:    Hours - Days  Discharge Planning: Anticipated Hospital Death  Primary Diagnoses: Present on Admission: . Essential hypertension . Thrombocytopenia ([place]) . Paroxysmal atrial fibrillation (HCC) . 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 Chronic combined systolic and diastolic CHF (congestive heart failure) ([place]) . Severe sepsis with septic shock ([place])   I have reviewed the medical record, interviewed the patient and family, and examined the patient. The following aspects are pertinent.  Past Medical History:  Diagnosis Date  . Abdominal mass  2008   chronic inflammatory mass of the mesentary Dr [name]  . 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 Anemia   . Anginal pain ([place])   . CAD (coronary artery disease)    cath 09/23/09 70% Dx1 (diffuse disease), 40% Cx, Large RCA 80-90% with heavy calcification / cath 06/2010 no change tiht RCA still best to treat medially with nosebleeds  . Chest pain    myoview 2006 no ischemia/ myoview 2009 no ischemia  . CHF (congestive heart failure) ([place])   . CKD (chronic kidney disease)   . COPD (chronic obstructive pulmonary disease) ([place])   . COVID-19   . Dyslipidemia   . GERD (gastroesophageal reflux disease)   . Gout   . History of bleeding ulcers    "had 13 back in the early 1980s" (09/28/2016)  . History of blood transfusion 09/28/2016  . Hypertension    no RAS  . Hypothyroidism   . Kidney mass 2008   [facility] at [facility]  . Left ventricular dysfunction    myoview 2006 normal / myoview 2009 normal / EF 60% cath 09/2009, EF 60% cath 06/2010  . Memory impairment    memory decreasing 06/2010  . Myocardial infarction ([place]) <2016X 1; 2016   "@ [facility]; @ [facility]"  . Nosebleed "frequently since ~ 1966"   significant, can not take ASA  . Pneumonia 08/2016-09/2016  . Rash Sept 2011   rash on buttocks, hospitalized hydralazine stopped but then restarted w/o return of rash  . Renal cancer ([place])   . Skin cancer of scalp   . Systolic click    no mitral valve prolapse  . Type II diabetes mellitus ([place])    Social History   Socioeconomic History  . Marital status: Married    Spouse name: Not on file  . Number of children: Not on file  . Years of education: Not on file  . Highest education level: Not on file  Occupational History  . Occupation: Retired    Fish farm manager: RETIRED  Tobacco Use  . Smoking status: Former Smoker    Packs/day: 0.50    Years: 42.00    Pack years: 21.00    Types: Cigarettes    Quit date: 10/11/1980    Years since quitting: 40.1  .  Smokeless tobacco: Never Used  Substance and Sexual Activity  . Alcohol use: Not Currently    Alcohol/week: 0.0 standard drinks    Comment: 09/28/2016 "don't drink at all anymore; nothing in years; used to have a social drink"  . Drug use: No  . Sexual activity: Not on file  Other Topics Concern  . Not on file  Social History Narrative  . Not on file   Social Determinants of Health   Financial Resource Strain: Not on file  Food Insecurity: Not on file  Transportation Needs: Not on file  Physical Activity: Not on file  Stress: Not on file  Social Connections: Not on file   Scheduled Meds: . chlorhexidine gluconate (MEDLINE KIT)  15 mL Mouth Rinse BID  . Chlorhexidine Gluconate Cloth  6 each Topical Daily  . docusate  100 mg Per Tube BID  . hydrocortisone sod succinate (SOLU-CORTEF) inj  100 mg Intravenous Q8H  . insulin aspart  0-9 Units Subcutaneous Q8H  . mouth rinse  15 mL Mouth Rinse 10 times per day  . pantoprazole (PROTONIX) IV  40 mg Intravenous Daily  . polyethylene glycol  17 g Per Tube Daily   Continuous Infusions: . sodium chloride Stopped (11/15/2020 2132)  . cefTRIAXone (ROCEPHIN)  IV    . dextrose 5% lactated ringers 150 mL/hr at 12/11/2020 1020  . norepinephrine (LEVOPHED) Adult infusion 25 mcg/min (11-Dec-2020 1020)  . sodium bicarbonate (isotonic) 150 mEq in D5W 1000 mL infusion 75 mL/hr at 2020/12/11 1020  . vasopressin 0.03 Units/min (2020/12/11 1020)   PRN Meds:.acetaminophen **OR** acetaminophen, albuterol, fentaNYL (SUBLIMAZE) injection, fentaNYL (SUBLIMAZE) injection, midazolam, midazolam, ondansetron **OR** ondansetron (ZOFRAN) IV Medications Prior to Admission:  Prior to Admission medications   Medication Sig Start Date End Date Taking? Authorizing Provider  acetaminophen (TYLENOL) 500 MG tablet Take 500 mg by mouth every 6 (six) hours as needed for moderate pain.   Yes [provider]  albuterol-ipratropium (COMBIVENT) 18-103 MCG/ACT inhaler Inhale  2-4 puffs into the lungs 2 (two) times daily as needed for wheezing.    Yes [provider]  allopurinol (ZYLOPRIM) 100 MG tablet Take 1 tablet (100 mg total) by mouth 2 (two) times daily. Patient taking differently: Take 100 mg by mouth daily. 09/13/16  Yes Annita Brod, MD  amLODipine (NORVASC) 5 MG tablet Take 1 tablet (5 mg total) by mouth daily. 08/02/20  Yes Noemi Chapel, MD  carvedilol (COREG) 3.125 MG tablet Take 1 tablet by mouth 2 (two) times daily with a meal.    Yes [provider]  Cholecalciferol (VITAMIN D3) 5000 UNITS CAPS Take 5,000 Units by mouth daily.    Yes [provider]  clotrimazole (LOTRIMIN) 1 % cream Apply topically daily. 04/15/20  Yes [provider]  cyanocobalamin 1000 MCG tablet Take 2 tablets by mouth daily.  03/17/11  Yes [provider]  furosemide (LASIX) 40 MG tablet Take 20 mg by mouth daily.    Yes [provider]  hydroxyurea (HYDREA) 500 MG capsule Take 500 mg by mouth daily. At 4:30 pm Monday-Friday 03/23/19  Yes [provider]  levothyroxine (SYNTHROID, LEVOTHROID) 125 MCG tablet Take 125 mcg by mouth daily.   Yes [provider]  metFORMIN (GLUCOPHAGE) 500 MG tablet Take 500 mg by mouth daily.   Yes [provider]  methylPREDNISolone (MEDROL DOSEPAK) 4 MG TBPK tablet Take 4 mg by mouth See admin instructions. 6,5,4,3,2,1 dose down 11/26/20 12/03/20 Yes [provider]  montelukast (SINGULAIR) 10 MG tablet Take 10 mg by mouth at bedtime.   Yes [provider]  nitroGLYCERIN (NITRODUR - DOSED IN MG/24 HR) 0.6 mg/hr patch Place 0.6 mg onto the skin daily.   Yes [provider]  nitroGLYCERIN (NITROSTAT) 0.4 MG SL tablet Place 0.4 mg under the tongue every 5 (five) minutes as needed for chest pain.   Yes [provider]  Omega-3 Fatty Acids (FISH OIL) 1000 MG CPDR Take 1,200 mg by mouth daily.    Yes [provider]  potassium  chloride (  KLOR-CON) 10 MEQ tablet Take 10 mEq by mouth daily. 10/22/19  Yes [provider]  pravastatin (PRAVACHOL) 20 MG tablet Take 20 mg by mouth every evening.   Yes [provider]  sacubitril-valsartan (ENTRESTO) 24-26 MG Take 1 tablet by mouth 2 (two) times daily. 03/06/20  Yes [provider]  SSD 1 % cream Apply topically daily. 05/20/20  Yes [provider]  tamsulosin (FLOMAX) 0.4 MG CAPS capsule Take 0.4 mg by mouth daily.   Yes [provider]  cephALEXin (KEFLEX) 500 MG capsule Take 1 capsule (500 mg total) by mouth 4 (four) times daily. Patient not taking: No sig reported 09/29/20   Milton Ferguson, MD  famotidine (PEPCID) 20 MG tablet Take 1 tablet (20 mg total) by mouth at bedtime. Patient not taking: Reported on 12/05/2020 07/22/20   [name], MD  pantoprazole (PROTONIX) 40 MG tablet Take 1 tablet (40 mg total) by mouth daily before breakfast. Patient not taking: Reported on 12/03/2020 07/22/20   [name], MD  tiotropium (SPIRIVA) 18 MCG inhalation capsule Place 18 mcg into inhaler and inhale daily. Patient not taking: Reported on 11/29/2020    [provider]  zolpidem (AMBIEN) 10 MG tablet Take 1 tablet by mouth at bedtime as needed. Patient not taking: No sig reported 05/09/17   [provider]   Allergies  Allergen Reactions  . Ranolazine Shortness Of Breath  . Simvastatin Other (See Comments)    fatigue  . Aspirin Other (See Comments)    Causes severe bleeding  . Contrast Media [Iodinated Diagnostic Agents] Other (See Comments)    Will shut kidneys down  . Ibuprofen Other (See Comments)    Nose bleeds  . Ioxaglate Other (See Comments)    Will shut kidneys down   Review of Systems  Unable to perform ROS: Intubated    Physical Exam Vitals and nursing note reviewed.  Cardiovascular:     Rate and Rhythm: Rhythm irregular.  Pulmonary:     Comments: intubated Skin:    Coloration: Skin is  pale.     Findings: Bruising present.  Neurological:     Comments: unresponsive     Vital Signs: BP 120/73   Pulse 75   Temp (!) 96.8 F (36 C)   Resp (!) 28   Ht 6' 2"  (1.88 m)   Wt 83 kg   SpO2 98%   BMI 23.49 kg/m  Pain Scale: Faces       SpO2: SpO2: 98 % O2 Device:SpO2: 98 % O2 Flow Rate: .O2 Flow Rate (L/min): 2 L/min  IO: Intake/output summary:   Intake/Output Summary (Last 24 hours) at 2020-12-16 1030 Last data filed at 16-Dec-2020 1020 Gross per 24 hour  Intake 6654.43 ml  Output --  Net 6654.43 ml    LBM:   Baseline Weight: Weight: 74.3 kg Most recent weight: Weight: 83 kg     Palliative Assessment/Data: PPS: 10%     Thank you for this consult. Palliative medicine will continue to follow and assist as needed.   Time In: 0921 Time Out: 1102 Time Total: 101 mins Time In:1501 Time Out: 1520  Greater than 50%  of this time was spent counseling and coordinating care related to the above assessment and plan.  Signed by: Mariana Kaufman, AGNP-C Palliative Medicine    Please contact Palliative Medicine Team phone at [phone] for questions and concerns.  For individual provider: See Shea Evans

## 2020-12-09 NOTE — Progress Notes (Signed)
ANTICOAGULATION CONSULT NOTE - Initial Consult  Pharmacy Consult for heparin Indication: chest pain/ACS  Allergies  Allergen Reactions  . Ranolazine Shortness Of Breath  . Simvastatin Other (See Comments)    fatigue  . Aspirin Other (See Comments)    Causes severe bleeding  . Contrast Media [Iodinated Diagnostic Agents] Other (See Comments)    Will shut kidneys down  . Ibuprofen Other (See Comments)    Nose bleeds  . Ioxaglate Other (See Comments)    Will shut kidneys down    Patient Measurements: Height: 6' 2"  (188 cm) Weight: 74.3 kg (163 lb 12.8 oz) IBW/kg (Calculated) : 82.2  Vital Signs: Temp: 99.14 F (37.3 C) (02/20 2300) Temp Source: Rectal (02/20 1413) BP: 155/61 (02/20 2300) Pulse Rate: 90 (02/20 2300)  Labs: Recent Labs    12/06/2020 1220 11/18/2020 1552 11/23/2020 2236 Dec 25, 2020 0030  HGB 12.6*  --   --   --   HCT 39.1  --   --   --   PLT 64*  --   --   --   CREATININE 1.85*  --   --   --   CKTOTAL  --  1,108*  --   --   TROPONINIHS  --   --  4,234* 10,305*    Estimated Creatinine Clearance: 26.2 mL/min (A) (by C-G formula based on SCr of 1.85 mg/dL (H)).   Medical History: Past Medical History:  Diagnosis Date  . Abdominal mass 2008   chronic inflammatory mass of the mesentary Dr [name]  . 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 Anemia   . Anginal pain ([place])   . CAD (coronary artery disease)    cath 09/23/09 70% Dx1 (diffuse disease), 40% Cx, Large RCA 80-90% with heavy calcification / cath 06/2010 no change tiht RCA still best to treat medially with nosebleeds  . Chest pain    myoview 2006 no ischemia/ myoview 2009 no ischemia  . CHF (congestive heart failure) ([place])   . CKD (chronic kidney disease)   . COPD (chronic obstructive pulmonary disease) ([place])   . COVID-19   . Dyslipidemia   . GERD (gastroesophageal reflux disease)   . Gout   . History of bleeding ulcers    "had 13 back in the early 1980s" (09/28/2016)  . History of  blood transfusion 09/28/2016  . Hypertension    no RAS  . Hypothyroidism   . Kidney mass 2008   [facility]frequency [facility] at [facility]  . Left ventricular dysfunction    myoview 2006 normal / myoview 2009 normal / EF 60% cath 09/2009, EF 60% cath 06/2010  . Memory impairment    memory decreasing 06/2010  . Myocardial infarction ([place]) <2016X 1; 2016   "@ [facility]; @ [facility]"  . Nosebleed "frequently since ~ 1966"   significant, can not take ASA  . Pneumonia 08/2016-09/2016  . Rash Sept 2011   rash on buttocks, hospitalized hydralazine stopped but then restarted w/o return of rash  . Renal cancer ([place])   . Skin cancer of scalp   . Systolic click    no mitral valve prolapse  . Type II diabetes mellitus (HCC)     Medications:  Medications Prior to Admission  Medication Sig Dispense Refill Last Dose  . acetaminophen (TYLENOL) 500 MG tablet Take 500 mg by mouth every 6 (six) hours as needed for moderate pain.   11/29/2020 at Unknown time  . albuterol-ipratropium (COMBIVENT) 18-103 MCG/ACT inhaler Inhale 2-4 puffs into the lungs 2 (two) times  daily as needed for wheezing.      Marland Kitchen allopurinol (ZYLOPRIM) 100 MG tablet Take 1 tablet (100 mg total) by mouth 2 (two) times daily. (Patient taking differently: Take 100 mg by mouth daily.) 60 tablet 1 11/29/2020 at 2030  . amLODipine (NORVASC) 5 MG tablet Take 1 tablet (5 mg total) by mouth daily. 30 tablet 1 11/29/2020 at Unknown time  . carvedilol (COREG) 3.125 MG tablet Take 1 tablet by mouth 2 (two) times daily with a meal.    11/29/2020 at 2030  . Cholecalciferol (VITAMIN D3) 5000 UNITS CAPS Take 5,000 Units by mouth daily.    11/29/2020 at Unknown time  . clotrimazole (LOTRIMIN) 1 % cream Apply topically daily.   11/29/2020 at Unknown time  . cyanocobalamin 1000 MCG tablet Take 2 tablets by mouth daily.    11/29/2020 at Unknown time  . furosemide (LASIX) 40 MG tablet Take 20 mg by mouth daily.    11/29/2020 at Unknown time  . hydroxyurea (HYDREA) 500 MG  capsule Take 500 mg by mouth daily. At 4:30 pm Monday-Friday   11/29/2020 at Unknown time  . levothyroxine (SYNTHROID, LEVOTHROID) 125 MCG tablet Take 125 mcg by mouth daily.   11/29/2020 at Unknown time  . metFORMIN (GLUCOPHAGE) 500 MG tablet Take 500 mg by mouth daily.   Past Week at Unknown time  . methylPREDNISolone (MEDROL DOSEPAK) 4 MG TBPK tablet Take 4 mg by mouth See admin instructions. 6,5,4,3,2,1 dose down   11/29/2020 at Unknown time  . montelukast (SINGULAIR) 10 MG tablet Take 10 mg by mouth at bedtime.   11/29/2020 at Unknown time  . nitroGLYCERIN (NITRODUR - DOSED IN MG/24 HR) 0.6 mg/hr patch Place 0.6 mg onto the skin daily.   11/29/2020 at Unknown time  . nitroGLYCERIN (NITROSTAT) 0.4 MG SL tablet Place 0.4 mg under the tongue every 5 (five) minutes as needed for chest pain.     . Omega-3 Fatty Acids (FISH OIL) 1000 MG CPDR Take 1,200 mg by mouth daily.    11/29/2020 at Unknown time  . potassium chloride (KLOR-CON) 10 MEQ tablet Take 10 mEq by mouth daily.   11/29/2020 at Unknown time  . pravastatin (PRAVACHOL) 20 MG tablet Take 20 mg by mouth every evening.     . sacubitril-valsartan (ENTRESTO) 24-26 MG Take 1 tablet by mouth 2 (two) times daily.   11/29/2020 at Unknown time  . SSD 1 % cream Apply topically daily.   11/29/2020 at Unknown time  . tamsulosin (FLOMAX) 0.4 MG CAPS capsule Take 0.4 mg by mouth daily.   11/29/2020 at Unknown time  . cephALEXin (KEFLEX) 500 MG capsule Take 1 capsule (500 mg total) by mouth 4 (four) times daily. (Patient not taking: No sig reported) 28 capsule 0 Completed Course at Unknown time  . famotidine (PEPCID) 20 MG tablet Take 1 tablet (20 mg total) by mouth at bedtime. (Patient not taking: Reported on 11/24/2020) 90 tablet 3 Not Taking at Unknown time  . pantoprazole (PROTONIX) 40 MG tablet Take 1 tablet (40 mg total) by mouth daily before breakfast. (Patient not taking: Reported on 11/14/2020) 90 tablet 3 Not Taking at Unknown time  . tiotropium (SPIRIVA) 18  MCG inhalation capsule Place 18 mcg into inhaler and inhale daily. (Patient not taking: Reported on 11/29/2020)   Not Taking at Unknown time  . zolpidem (AMBIEN) 10 MG tablet Take 1 tablet by mouth at bedtime as needed. (Patient not taking: No sig reported)   Not Taking at Unknown time  Scheduled:  . chlorhexidine gluconate (MEDLINE KIT)  15 mL Mouth Rinse BID  . Chlorhexidine Gluconate Cloth  6 each Topical Daily  . docusate  100 mg Per Tube BID  . hydrocortisone sod succinate (SOLU-CORTEF) inj  50 mg Intravenous Q8H  . insulin aspart  0-9 Units Subcutaneous Q8H  . mouth rinse  15 mL Mouth Rinse 10 times per day  . pantoprazole (PROTONIX) IV  40 mg Intravenous Daily  . polyethylene glycol  17 g Per Tube Daily   Infusions:  . sodium chloride Stopped (11/23/2020 2132)  . ceFEPime (MAXIPIME) IV    . dextrose 5% lactated ringers 150 mL/hr at 12/04/20 0010  . metronidazole 500 mg (12/05/2020 2242)  . norepinephrine (LEVOPHED) Adult infusion 40 mcg/min (2020/12/04 0119)  . sodium bicarbonate (isotonic) 150 mEq in D5W 1000 mL infusion 75 mL/hr at 12/08/2020 2327  . [START ON 12/02/2020] vancomycin    . vasopressin 0.03 Units/min (11/24/2020 2220)    Assessment: 87yo male presents after a fall at home at [place], not found until 10p by son, admitted for sepsis, now w/ elevated and sharply rising troponin ([id number]), to begin heparin.  Goal of Therapy:  Heparin level 0.3-0.7 units/ml Monitor platelets by anticoagulation protocol: Yes   Plan:  Will give heparin 2000 units IV bolus x1 followed by gtt at 850 units/hr and monitor heparin levels and CBC.  Wynona Neat, PharmD, BCPS  2020-12-04,1:30 AM

## 2020-12-09 NOTE — Progress Notes (Signed)
I have personally called to speak with cardiology, Dr. Blossom Hoops re significant elevation in trops. He reports that this trop is likely a reflection of severity of illness and does not recommend transfer to [facility] at this time.

## 2020-12-09 NOTE — Progress Notes (Signed)
Upon morning assessment, attending RN noticed that patient's O2 saturation wasn't picking up. Attempted in all locations, forehead, fingers, and ear and none will show up due to patient being too cold. Headband even applied around the forehead probe to see if that would help and still no reading. Warm blanket applied to patient's hand in hopes of a reading to show up. Will continue to monitor and try different methods.

## 2020-12-09 NOTE — Progress Notes (Signed)
Pt extubated at 1515 to room air per order. RN, RT, and palliative NP present at bedside along with all family members. Pt tolerated fair, will continue to monitor.

## 2020-12-09 NOTE — Progress Notes (Signed)
Pt seen by PMT and family has spoken with Dr. Dyann Kief.  DNR with plan for comfort care once remainder of family arrives.  I spoke with family and reviewed process for terminal extubation.  Please call if additional help needed during his hospitalization.  Brian Mires, MD [place] Pager - [phone] 12/05/2020, 2:37 PM

## 2020-12-09 NOTE — Progress Notes (Signed)
Lab called lactic acid of >11. MD notified.

## 2020-12-09 NOTE — Progress Notes (Signed)
25,099 trop called by lab. MD notified.

## 2020-12-09 NOTE — Progress Notes (Signed)
[facility] Progress Note Patient Name: MURREL BERTRAM DOB: 23-Dec-1924 MRN: [id number]   Date of Service  2020-12-29  HPI/Events of Note  Troponin = 4234. EKG reveals accelerated Junctional rhythm. Rate = 75. Septal infarct , age undetermined. ST & T wave abnormality, consider lateral ischemia. Heparin IV infusion ordered by hospitalist.   eICU Interventions  Agree with Heparin IV infusion per pharmacy consult. Continue present management.      Intervention Category Major Interventions: Other:  Lysle Dingwall December 29, 2020, 1:38 AM

## 2020-12-09 NOTE — Death Summary Note (Addendum)
DEATH SUMMARY   Patient Details  Name: Brian Mcmillan MRN: [id number] DOB: [birth year]  Admission/Discharge Information   Admit Date:  12-16-2020  Date of Death: Date of Death: 17-Dec-2020  Time of Death: Time of Death: 1550/01/14  Length of Stay: 1  Referring Physician: Redmond School, MD   Reason(s) for Hospitalization  Found down on the floor, confused and lethargic.  Diagnoses  Preliminary cause of death:  Secondary Diagnoses (including complications and co-morbidities):  Principal Problem:   Severe sepsis with septic shock due to E. 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 year old male who has been medical history significant for acute myeloid leukemia, chronic kidney disease stage IIIb, systolic heart failure, paroxysmal atrial fibrillation, coronary artery disease, hypertension, hypothyroidism, dyslipidemia and history of COPD; who presented after being found down, confused and lethargic.  Work-up in the ED demonstrating severe sepsis with septic shock [place] failing to improve after fluid resuscitation.  There was no abnormality seen on chest x-ray.  Urinalysis which shows finding of daily urine, but no nitrites and just rare bacteria.  There has not been any reports of dysuria or  abdominal pain throughout admission.  Patient condition continued deteriorating and he fell ability to protect his airways patient was intubated and mechanically ventilated.  Due to ongoing multiple pressure hemostatic on vasopressin, Solu-Cortef and Levophed; After that his map was maintained above 65.  Further work-up in the demonstrating steadily climbing troponin without acute ischemic changes on EKG, patient qualifies for NSTEMI.  His lactic acid remains more than 11.  Subsequent blood work determine acute on chronic kidney disease with creatinine up to 8, severe metabolic acidosis and ABG that demonstrated a PO2 of 19 despite mechanical ventilation.  Without the need of significant sedation patient remains essentially unresponsive and not demonstrating any purposeful movement or activity.  Patient was covered with broad-spectrum antibiotics initially and subsequently narrowed to Rocephin.  Despite aggressive intervention he continue actively declining suggesting a very low likelihood of recovery back to functional status.  Long discussion with family at bedside with assistance by palliative care service led to the decision of withdrawing care and pursuing terminal extubation.  Patient was initiated on comfort management and peacefully expire at 15:51 with his family at bedside.    Pertinent Labs and Studies  Significant Diagnostic Studies CT Abdomen Pelvis Wo Contrast  Result Date: 12-16-20 CLINICAL DATA:  87 year old male with abdominal pain. EXAM: CT ABDOMEN AND PELVIS WITHOUT CONTRAST TECHNIQUE: Multidetector CT imaging of the abdomen and pelvis was performed following the standard protocol without IV contrast. COMPARISON:  CT abdomen pelvis dated 11/21/2008. FINDINGS: Evaluation of this exam is limited in the absence of intravenous contrast. Evaluation is also limited due to streak artifact caused by patient's arms. Lower chest: Diffuse interstitial coarsening. Minimal bibasilar atelectasis.  There is cardiomegaly and coronary  vascular calcification. No intra-abdominal free air.  Trace free fluid in the pelvis. Hepatobiliary: Indeterminate 1 cm hypodense lesion in the left lobe of liver most likely a cyst. This was present on the prior CT. The liver is otherwise unremarkable. No intrahepatic biliary dilatation. No calcified gallstone or pericholecystic fluid. Pancreas: Scattered calcifications, likely sequela of prior pancreatitis. No active inflammatory changes. No peripancreatic fluid collection. Spleen: Normal in size without focal abnormality. Adrenals/Urinary Tract: The adrenal glands unremarkable. Dystrophic calcification of the previously seen left renal upper pole mass status post prior RFA. There is a 3 mm nonobstructing right renal upper pole calculus. There is no hydronephrosis or obstructing stone on either side. The visualized ureters appear unremarkable. The urinary bladder is predominantly collapsed. Stomach/Bowel: Mild thickened appearance of small-bowel loops may be related to underdistention but concerning for mild enteritis. Clinical correlation is recommended. Several scattered colonic diverticula without active inflammatory changes. There is also diffuse thickened appearance of the colon which may be related to under distension or represent mild colitis. There is no bowel obstruction. The appendix is normal. Vascular/Lymphatic: Advanced aortoiliac atherosclerotic disease. The IVC is unremarkable. No portal venous gas. There is no adenopathy. Reproductive: Interval decrease in the size of the soft tissue component of the mesenteric lesion with coarse calcification compared to the prior CT. Other: Mild subcutaneous edema. Musculoskeletal: Osteopenia with degenerative changes of the spine. No acute osseous pathology. IMPRESSION: 1. Underdistention versus mild enterocolitis. Clinical correlation is recommended. No bowel obstruction. Normal appendix. 2. Colonic diverticulosis. 3.  Additional nonacute findings as above. 4. Aortic Atherosclerosis (ICD10-I70.0). Electronically Signed   By: Anner Crete M.D.   On: 11/22/2020 15:29   DG Chest 1 View  Result Date: 11/23/2020 CLINICAL DATA:  Found down in bathroom by family, fell around 0520 hours this morning, history coronary artery disease, CHF, COPD, COVID-19, hypertension, MI, renal cancer EXAM: CHEST  1 VIEW COMPARISON:  10/09/2020 FINDINGS: Enlargement of cardiac silhouette. Mediastinal contours and pulmonary vascularity normal. Atherosclerotic calcification aorta. Mild elevation of RIGHT diaphragm with mild bibasilar atelectasis. Minimal RIGHT upper lobe scarring. Lungs otherwise clear. No acute infiltrate, pleural effusion, or pneumothorax. IMPRESSION: Enlargement of cardiac silhouette with bibasilar atelectasis. Aortic Atherosclerosis (ICD10-I70.0). Electronically Signed   By: Lavonia Dana M.D.   On: 11/23/2020 13:52   CT Head Wo Contrast  Result Date: 11/29/2020 CLINICAL DATA:  Patient found down this morning after a fall. Initial encounter. EXAM: CT HEAD WITHOUT CONTRAST CT MAXILLOFACIAL WITHOUT CONTRAST CT CERVICAL SPINE WITHOUT CONTRAST TECHNIQUE: Multidetector CT imaging of the head, cervical spine, and maxillofacial structures were performed using the standard protocol without intravenous contrast. Multiplanar CT image reconstructions of the cervical spine and maxillofacial structures were also generated. COMPARISON:  Head CT scan 12/04/2014 and 08/01/2020. FINDINGS: CT HEAD FINDINGS Brain: No evidence of acute infarction, hemorrhage, hydrocephalus, extra-axial collection or mass lesion/mass effect. Vascular: No hyperdense vessel or unexpected calcification. Skull: Intact.  No focal lesion. Other: Soft tissue contusion is seen above the right eye. CT MAXILLOFACIAL FINDINGS Osseous: No fracture or mandibular dislocation. No destructive process. Orbits: Status post cataract surgery. Orbital fat is clear. Globes are intact.  No fracture. Sinuses: Minimal mucosal thickening left ethmoid air cells. Otherwise clear. Soft tissues: Negative. CT CERVICAL SPINE FINDINGS Alignment: Maintained. Skull base and vertebrae: No acute fracture. No primary bone lesion or focal pathologic process. Soft tissues and spinal canal: No prevertebral fluid or swelling. No visible canal hematoma. Disc levels: Intervertebral disc space height is maintained. Multilevel facet arthropathy noted. Upper  chest: Clear. Other: None. IMPRESSION: Soft tissue contusion above the right eye. No other acute abnormality head, face or cervical spine. Electronically Signed   By: Inge Rise M.D.   On: 11/21/2020 13:26   CT Cervical Spine Wo Contrast  Result Date: 11/22/2020 CLINICAL DATA:  Patient found down this morning after a fall. Initial encounter. EXAM: CT HEAD WITHOUT CONTRAST CT MAXILLOFACIAL WITHOUT CONTRAST CT CERVICAL SPINE WITHOUT CONTRAST TECHNIQUE: Multidetector CT imaging of the head, cervical spine, and maxillofacial structures were performed using the standard protocol without intravenous contrast. Multiplanar CT image reconstructions of the cervical spine and maxillofacial structures were also generated. COMPARISON:  Head CT scan 12/04/2014 and 08/01/2020. FINDINGS: CT HEAD FINDINGS Brain: No evidence of acute infarction, hemorrhage, hydrocephalus, extra-axial collection or mass lesion/mass effect. Vascular: No hyperdense vessel or unexpected calcification. Skull: Intact.  No focal lesion. Other: Soft tissue contusion is seen above the right eye. CT MAXILLOFACIAL FINDINGS Osseous: No fracture or mandibular dislocation. No destructive process. Orbits: Status post cataract surgery. Orbital fat is clear. Globes are intact. No fracture. Sinuses: Minimal mucosal thickening left ethmoid air cells. Otherwise clear. Soft tissues: Negative. CT CERVICAL SPINE FINDINGS Alignment: Maintained. Skull base and vertebrae: No acute fracture. No primary bone lesion or  focal pathologic process. Soft tissues and spinal canal: No prevertebral fluid or swelling. No visible canal hematoma. Disc levels: Intervertebral disc space height is maintained. Multilevel facet arthropathy noted. Upper chest: Clear. Other: None. IMPRESSION: Soft tissue contusion above the right eye. No other acute abnormality head, face or cervical spine. Electronically Signed   By: Inge Rise M.D.   On: 11/28/2020 13:26   Portable Chest xray  Result Date: 12/05/20 CLINICAL DATA:  Fall at home EXAM: PORTABLE CHEST 1 VIEW COMPARISON:  Yesterday FINDINGS: Endotracheal tube with tip just below the clavicular heads. The enteric tube reaches the stomach. Stable interstitial opacity. Stable borderline heart size. Negative for pneumothorax. IMPRESSION: Stable hardware positioning and aeration. Electronically Signed   By: Monte Fantasia M.D.   On: December 05, 2020 06:25   DG CHEST PORT 1 VIEW  Result Date: 12/04/2020 CLINICAL DATA:  Status post intubation. EXAM: PORTABLE CHEST 1 VIEW COMPARISON:  November 30, 2020 (12:48 p.m.) FINDINGS: Interval endotracheal tube placement is noted with its distal tip approximately 6.0 cm from the carina. Interval nasogastric tube placement is also seen with its distal end extending below the level of the diaphragm. Mild, stable scarring and/or atelectasis is seen within the right upper lobe and bilateral lung bases. There is no evidence of a pleural effusion or pneumothorax. There is moderate to marked severity enlargement of the cardiac silhouette. Marked severity calcification of the aortic arch is seen. The visualized skeletal structures are unremarkable. IMPRESSION: Interval endotracheal tube placement and nasogastric tube placement positioning, as described above. Electronically Signed   By: Virgina Norfolk M.D.   On: 11/28/2020 19:41   DG Hips Bilat W or Wo Pelvis 2 Views  Result Date: 11/15/2020 CLINICAL DATA:  Found down in bathroom by family EXAM: DG HIP (WITH  OR WITHOUT PELVIS) 2V BILAT COMPARISON:  09/03/2016 FINDINGS: Osseous demineralization. Hip and SI joint spaces preserved. No fracture, dislocation, or bone destruction. Degenerative disc and facet disease changes at visualized lower lumbar spine. IMPRESSION: No acute pelvic abnormalities. Electronically Signed   By: Lavonia Dana M.D.   On: 11/11/2020 13:55   CT Maxillofacial WO CM  Result Date: 11/29/2020 CLINICAL DATA:  Patient found down this morning after a fall. Initial encounter. EXAM: CT HEAD  WITHOUT CONTRAST CT MAXILLOFACIAL WITHOUT CONTRAST CT CERVICAL SPINE WITHOUT CONTRAST TECHNIQUE: Multidetector CT imaging of the head, cervical spine, and maxillofacial structures were performed using the standard protocol without intravenous contrast. Multiplanar CT image reconstructions of the cervical spine and maxillofacial structures were also generated. COMPARISON:  Head CT scan 12/04/2014 and 08/01/2020. FINDINGS: CT HEAD FINDINGS Brain: No evidence of acute infarction, hemorrhage, hydrocephalus, extra-axial collection or mass lesion/mass effect. Vascular: No hyperdense vessel or unexpected calcification. Skull: Intact.  No focal lesion. Other: Soft tissue contusion is seen above the right eye. CT MAXILLOFACIAL FINDINGS Osseous: No fracture or mandibular dislocation. No destructive process. Orbits: Status post cataract surgery. Orbital fat is clear. Globes are intact. No fracture. Sinuses: Minimal mucosal thickening left ethmoid air cells. Otherwise clear. Soft tissues: Negative. CT CERVICAL SPINE FINDINGS Alignment: Maintained. Skull base and vertebrae: No acute fracture. No primary bone lesion or focal pathologic process. Soft tissues and spinal canal: No prevertebral fluid or swelling. No visible canal hematoma. Disc levels: Intervertebral disc space height is maintained. Multilevel facet arthropathy noted. Upper chest: Clear. Other: None. IMPRESSION: Soft tissue contusion above the right eye. No other acute  abnormality head, face or cervical spine. Electronically Signed   By: Inge Rise M.D.   On: 12/06/2020 13:26    Microbiology Recent Results (from the past 240 hour(s))  Resp Panel by RT-PCR (Flu A&B, Covid) Nasopharyngeal Swab     Status: None   Collection Time: 12/07/2020 12:05 PM   Specimen: Nasopharyngeal Swab; Nasopharyngeal(NP) swabs in vial transport medium  Result Value Ref Range Status   SARS Coronavirus 2 by RT PCR NEGATIVE NEGATIVE Final    Comment: (NOTE) SARS-CoV-2 target nucleic acids are NOT DETECTED.  The SARS-CoV-2 RNA is generally detectable in upper respiratory specimens during the acute phase of infection. The lowest concentration of SARS-CoV-2 viral copies this assay can detect is 138 copies/mL. A negative result does not preclude SARS-Cov-2 infection and should not be used as the sole basis for treatment or other patient management decisions. A negative result may occur with  improper specimen collection/handling, submission of specimen other than nasopharyngeal swab, presence of viral mutation(s) within the areas targeted by this assay, and inadequate number of viral copies(<138 copies/mL). A negative result must be combined with clinical observations, patient history, and epidemiological information. The expected result is Negative.  Fact Sheet for Patients:  EntrepreneurPulse.com.au  Fact Sheet for Healthcare Providers:  IncredibleEmployment.be  This test is no t yet approved or cleared by the [place] FDA and  has been authorized for detection and/or diagnosis of SARS-CoV-2 by FDA under an Emergency Use Authorization (EUA). This EUA will remain  in effect (meaning this test can be used) for the duration of the COVID-19 declaration under Section 564(b)(1) of the Act, 21 U.S.C.section 360bbb-3(b)(1), unless the authorization is terminated  or revoked sooner.       Influenza A by PCR NEGATIVE NEGATIVE Final    Influenza B by PCR NEGATIVE NEGATIVE Final    Comment: (NOTE) The Xpert Xpress SARS-CoV-2/FLU/RSV plus assay is intended as an aid in the diagnosis of influenza from Nasopharyngeal swab specimens and should not be used as a sole basis for treatment. Nasal washings and aspirates are unacceptable for Xpert Xpress SARS-CoV-2/FLU/RSV testing.  Fact Sheet for Patients: EntrepreneurPulse.com.au  Fact Sheet for Healthcare Providers: IncredibleEmployment.be  This test is not yet approved or cleared by the [place] FDA and has been authorized for detection and/or diagnosis of SARS-CoV-2 by FDA under an Emergency  Use Authorization (EUA). This EUA will remain in effect (meaning this test can be used) for the duration of the COVID-19 declaration under Section 564(b)(1) of the Act, 21 U.S.C. section 360bbb-3(b)(1), unless the authorization is terminated or revoked.  Performed at [facility], [address], [place], [place] [address]   Culture, blood (routine x 2)     Status: None (Preliminary result)   Collection Time: 11/29/2020  1:54 PM   Specimen: BLOOD RIGHT ARM  Result Value Ref Range Status   Specimen Description   Final    BLOOD RIGHT ARM BOTTLES DRAWN AEROBIC AND ANAEROBIC Performed at [facility], [address] [place], [place] [address]    Special Requests   Final    Blood Culture adequate volume Performed at [facility], [address] [place], [place] [address]    Culture  Setup Time   Final    GRAM NEGATIVE RODS Gram Stain Report Called to,Read Back By and Verified With: HARRIS,B @2321  ON 11/18/2020 BY JUW IN BOTH AEROBIC AND ANAEROBIC BOTTLES GS DONE @ [facility] CRITICAL RESULT CALLED TO, READ BACK BY AND VERIFIED WITH: Performed at [facility], [place] [address], [place], [place] [address]    Culture   Final    NO GROWTH < 24 HOURS Performed at [facility], [address], [place],  [address]    Report Status PENDING   Incomplete  Blood Culture ID Panel (Reflexed)     Status: Abnormal   Collection Time: 11/26/2020  1:54 PM  Result Value Ref Range Status   Enterococcus faecalis NOT DETECTED NOT DETECTED Final   Enterococcus Faecium NOT DETECTED NOT DETECTED Final   Listeria monocytogenes NOT DETECTED NOT DETECTED Final   Staphylococcus species NOT DETECTED NOT DETECTED Final   Staphylococcus aureus (BCID) NOT DETECTED NOT DETECTED Final   Staphylococcus epidermidis NOT DETECTED NOT DETECTED Final   Staphylococcus lugdunensis NOT DETECTED NOT DETECTED Final   Streptococcus species NOT DETECTED NOT DETECTED Final   Streptococcus agalactiae NOT DETECTED NOT DETECTED Final   Streptococcus pneumoniae NOT DETECTED NOT DETECTED Final   Streptococcus pyogenes NOT DETECTED NOT DETECTED Final   A.calcoaceticus-baumannii NOT DETECTED NOT DETECTED Final   Bacteroides fragilis NOT DETECTED NOT DETECTED Final   Enterobacterales DETECTED (A) NOT DETECTED Final    Comment: Enterobacterales represent a large order of gram negative bacteria, not a single organism. CRITICAL RESULT CALLED TO, READ BACK BY AND VERIFIED WITH: RN RENEE BONDURANG BY MESSAN H. AT 0448 ON 12-12-2020    Enterobacter cloacae complex NOT DETECTED NOT DETECTED Final   Escherichia coli DETECTED (A) NOT DETECTED Final    Comment: CRITICAL RESULT CALLED TO, READ BACK BY AND VERIFIED WITH: RN RENEE BONDURANG BY MESSAN H. AT 0448 ON 12-12-20    Klebsiella aerogenes NOT DETECTED NOT DETECTED Final   Klebsiella oxytoca NOT DETECTED NOT DETECTED Final   Klebsiella pneumoniae NOT DETECTED NOT DETECTED Final   Proteus species NOT DETECTED NOT DETECTED Final   Salmonella species NOT DETECTED NOT DETECTED Final   Serratia marcescens NOT DETECTED NOT DETECTED Final   Haemophilus influenzae NOT DETECTED NOT DETECTED Final   Neisseria meningitidis NOT DETECTED NOT DETECTED Final   Pseudomonas aeruginosa NOT DETECTED NOT DETECTED Final   Stenotrophomonas  maltophilia NOT DETECTED NOT DETECTED Final   Candida albicans NOT DETECTED NOT DETECTED Final   Candida auris NOT DETECTED NOT DETECTED Final   Candida glabrata NOT DETECTED NOT DETECTED Final   Candida krusei NOT DETECTED NOT DETECTED  Final   Candida parapsilosis NOT DETECTED NOT DETECTED Final   Candida tropicalis NOT DETECTED NOT DETECTED Final   Cryptococcus neoformans/gattii NOT DETECTED NOT DETECTED Final   CTX-M ESBL NOT DETECTED NOT DETECTED Final   Carbapenem resistance IMP NOT DETECTED NOT DETECTED Final   Carbapenem resistance KPC NOT DETECTED NOT DETECTED Final   Carbapenem resistance NDM NOT DETECTED NOT DETECTED Final   Carbapenem resist OXA 48 LIKE NOT DETECTED NOT DETECTED Final   Carbapenem resistance VIM NOT DETECTED NOT DETECTED Final    Comment: Performed at [facility], [place] [address] [place], [place] [address]  Culture, blood (routine x 2)     Status: None (Preliminary result)   Collection Time: 11/11/2020  2:45 PM   Specimen: BLOOD RIGHT HAND  Result Value Ref Range Status   Specimen Description   Final    BLOOD RIGHT HAND BOTTLES DRAWN AEROBIC AND ANAEROBIC   Special Requests Blood Culture adequate volume  Final   Culture  Setup Time   Final    GRAM NEGATIVE RODS Gram Stain Report Called to,Read Back By and Verified With: HARRIS,B @ 2321 ON 11/23/2020 BY JUW IN BOTH AEROBIC AND ANAEROBIC BOTTLES GS DONE @ [facility]    Culture   Final    NO GROWTH < 24 HOURS Performed at [facility], [address], [place], [place] [address]    Report Status PENDING  Incomplete  MRSA PCR Screening     Status: None   Collection Time: 11/15/2020 10:38 PM   Specimen: Nasal Mucosa; Nasopharyngeal  Result Value Ref Range Status   MRSA by PCR NEGATIVE NEGATIVE Final    Comment:        The GeneXpert MRSA Assay (FDA approved for NASAL specimens only), is one component of a comprehensive MRSA colonization surveillance program. It is not intended to diagnose MRSA infection nor  to guide or monitor treatment for MRSA infections. Performed at [facility], [address] [place], [place] [address]     Lab Basic Metabolic Panel: Recent Labs  Lab 12/02/2020 1220 12/21/20 0500  NA 142 139  K 3.3* 4.3  CL 110 105  CO2 22 15*  GLUCOSE 74 326*  BUN 34* 35*  CREATININE 1.85* 3.01*  CALCIUM 9.5 8.2*  MG 1.3*  --    Liver Function Tests: Recent Labs  Lab 11/25/2020 1220  AST 76*  ALT 32  ALKPHOS 108  BILITOT 2.0*  PROT 6.4*  ALBUMIN 2.7*   CBC: Recent Labs  Lab 12/04/2020 1220 12/21/20 0500  WBC 3.8* 17.7*  NEUTROABS 3.4  --   HGB 12.6* 10.2*  HCT 39.1 33.9*  MCV 100.5* 107.3*  PLT 64* 33*   Cardiac Enzymes: Recent Labs  Lab 12/07/2020 1552  CKTOTAL 1,108*   Sepsis Labs: Recent Labs  Lab 11/17/2020 1220 12/08/2020 1417 11/29/2020 1552 11/14/2020 1851 12/08/2020 2323 2020/12/21 0335 12-21-20 0500  WBC 3.8*  --   --   --   --   --  17.7*  LATICACIDVEN  --    < > 8.3* 10.6* >11* >11*  --    < > = values in this interval not displayed.   Time: 35 minutes.  Barton Dubois 2020/12/21, 5:55 PM

## 2020-12-09 NOTE — Procedures (Signed)
**Note De-Identified Ltanya Bayley Obfuscation** Extubation Procedure Note  Patient Details:   Name: Brian Mcmillan DOB: 12/04/1924 MRN: [id number]   Airway Documentation:    Vent end date: 27-Dec-2020 Vent end time: 1515   Evaluation  O2 sats: stable throughout Complications: No apparent complications Patient did tolerate procedure well. Bilateral Breath Sounds: Clear,Diminished   No +leak Deliana Avalos, Penni Bombard 12-27-2020, 3:35 PM

## 2020-12-09 NOTE — Progress Notes (Signed)
[place] [address] called by lab. MD notified.

## 2020-12-09 NOTE — Progress Notes (Signed)
Pt passed away at 1551,surrounded by family. Verified by 2 RN's. [employer] called via [place], patient not a potential donor. Medical examiner called due to patient having a fall before coming in, patient not a potential case. Patient family want's [facility]. Will call patient placement when patient is ready for transport to the morgue.

## 2020-12-09 NOTE — Progress Notes (Signed)
All lines, drains removed from patient. WNL. Still bleeding so extra gauze and washcloth placed on the area where the central line was. Patient placement notified of completion of post mortem checklist.

## 2020-12-09 DEATH — deceased

## 2021-02-10 ENCOUNTER — Ambulatory Visit (INDEPENDENT_AMBULATORY_CARE_PROVIDER_SITE_OTHER): Payer: Medicare Other | Admitting: Internal Medicine
